# Patient Record
Sex: Female | Born: 1940 | Race: White | Hispanic: No | Smoking: Former smoker
Health system: Southern US, Community
[De-identification: ages and names within clinical notes are randomized; demographics above are authoritative.]

## PROBLEM LIST (undated history)

## (undated) DIAGNOSIS — J309 Allergic rhinitis, unspecified: Secondary | ICD-10-CM

## (undated) DIAGNOSIS — M810 Age-related osteoporosis without current pathological fracture: Secondary | ICD-10-CM

## (undated) DIAGNOSIS — Z9886 Personal history of breast implant removal: Secondary | ICD-10-CM

## (undated) DIAGNOSIS — Z8719 Personal history of other diseases of the digestive system: Secondary | ICD-10-CM

## (undated) DIAGNOSIS — F17200 Nicotine dependence, unspecified, uncomplicated: Secondary | ICD-10-CM

## (undated) DIAGNOSIS — J449 Chronic obstructive pulmonary disease, unspecified: Secondary | ICD-10-CM

## (undated) DIAGNOSIS — F411 Generalized anxiety disorder: Secondary | ICD-10-CM

## (undated) DIAGNOSIS — E042 Nontoxic multinodular goiter: Secondary | ICD-10-CM

## (undated) DIAGNOSIS — Z86010 Personal history of colonic polyps: Secondary | ICD-10-CM

## (undated) DIAGNOSIS — M1990 Unspecified osteoarthritis, unspecified site: Secondary | ICD-10-CM

## (undated) DIAGNOSIS — E785 Hyperlipidemia, unspecified: Secondary | ICD-10-CM

## (undated) DIAGNOSIS — G4700 Insomnia, unspecified: Secondary | ICD-10-CM

## (undated) DIAGNOSIS — R946 Abnormal results of thyroid function studies: Secondary | ICD-10-CM

## (undated) DIAGNOSIS — I10 Essential (primary) hypertension: Secondary | ICD-10-CM

## (undated) DIAGNOSIS — I2510 Atherosclerotic heart disease of native coronary artery without angina pectoris: Secondary | ICD-10-CM

## (undated) DIAGNOSIS — D649 Anemia, unspecified: Secondary | ICD-10-CM

## (undated) DIAGNOSIS — F1021 Alcohol dependence, in remission: Secondary | ICD-10-CM

## (undated) DIAGNOSIS — C50919 Malignant neoplasm of unspecified site of unspecified female breast: Secondary | ICD-10-CM

## (undated) HISTORY — DX: Abnormal results of thyroid function studies: R94.6

## (undated) HISTORY — PX: CATARACT EXTRACTION: SUR2

## (undated) HISTORY — DX: Malignant neoplasm of unspecified site of unspecified female breast: C50.919

## (undated) HISTORY — DX: Hyperlipidemia, unspecified: E78.5

## (undated) HISTORY — DX: Age-related osteoporosis without current pathological fracture: M81.0

## (undated) HISTORY — DX: Essential (primary) hypertension: I10

## (undated) HISTORY — DX: Atherosclerotic heart disease of native coronary artery without angina pectoris: I25.10

## (undated) HISTORY — DX: Nicotine dependence, unspecified, uncomplicated: F17.200

## (undated) HISTORY — DX: Insomnia, unspecified: G47.00

## (undated) HISTORY — DX: Personal history of colonic polyps: Z86.010

## (undated) HISTORY — DX: Nontoxic multinodular goiter: E04.2

## (undated) HISTORY — DX: Allergic rhinitis, unspecified: J30.9

## (undated) HISTORY — DX: Personal history of other diseases of the digestive system: Z87.19

## (undated) HISTORY — DX: Anemia, unspecified: D64.9

## (undated) HISTORY — DX: Generalized anxiety disorder: F41.1

## (undated) HISTORY — PX: BREAST SURGERY: SHX581

## (undated) HISTORY — DX: Personal history of breast implant removal: Z98.86

## (undated) HISTORY — DX: Unspecified osteoarthritis, unspecified site: M19.90

## (undated) HISTORY — DX: Alcohol dependence, in remission: F10.21

---

## 1999-02-06 ENCOUNTER — Encounter: Payer: Self-pay | Admitting: Gastroenterology

## 1999-02-06 ENCOUNTER — Inpatient Hospital Stay (HOSPITAL_COMMUNITY): Admission: EM | Admit: 1999-02-06 | Discharge: 1999-02-08 | Payer: Self-pay | Admitting: Gastroenterology

## 1999-02-07 ENCOUNTER — Encounter: Payer: Self-pay | Admitting: Gastroenterology

## 2004-05-26 LAB — HM COLONOSCOPY

## 2004-09-23 ENCOUNTER — Encounter: Payer: Self-pay | Admitting: Internal Medicine

## 2004-09-23 LAB — CONVERTED CEMR LAB

## 2004-12-16 ENCOUNTER — Ambulatory Visit: Payer: Self-pay | Admitting: Internal Medicine

## 2004-12-19 ENCOUNTER — Ambulatory Visit: Payer: Self-pay | Admitting: Internal Medicine

## 2005-01-20 ENCOUNTER — Ambulatory Visit: Payer: Self-pay | Admitting: Internal Medicine

## 2006-01-21 ENCOUNTER — Ambulatory Visit: Payer: Self-pay | Admitting: Internal Medicine

## 2007-02-02 ENCOUNTER — Ambulatory Visit: Payer: Self-pay | Admitting: Family Medicine

## 2007-02-14 ENCOUNTER — Encounter: Payer: Self-pay | Admitting: Internal Medicine

## 2007-02-14 DIAGNOSIS — E785 Hyperlipidemia, unspecified: Secondary | ICD-10-CM

## 2007-02-14 DIAGNOSIS — J309 Allergic rhinitis, unspecified: Secondary | ICD-10-CM

## 2007-02-14 DIAGNOSIS — Z860100 Personal history of colon polyps, unspecified: Secondary | ICD-10-CM

## 2007-02-14 DIAGNOSIS — Z8719 Personal history of other diseases of the digestive system: Secondary | ICD-10-CM

## 2007-02-14 DIAGNOSIS — M1990 Unspecified osteoarthritis, unspecified site: Secondary | ICD-10-CM | POA: Insufficient documentation

## 2007-02-14 DIAGNOSIS — F1021 Alcohol dependence, in remission: Secondary | ICD-10-CM

## 2007-02-14 DIAGNOSIS — I10 Essential (primary) hypertension: Secondary | ICD-10-CM | POA: Insufficient documentation

## 2007-02-14 DIAGNOSIS — E042 Nontoxic multinodular goiter: Secondary | ICD-10-CM

## 2007-02-14 DIAGNOSIS — M810 Age-related osteoporosis without current pathological fracture: Secondary | ICD-10-CM

## 2007-02-14 HISTORY — DX: Hyperlipidemia, unspecified: E78.5

## 2007-02-14 HISTORY — DX: Alcohol dependence, in remission: F10.21

## 2007-02-14 HISTORY — DX: Personal history of other diseases of the digestive system: Z87.19

## 2007-02-14 HISTORY — DX: Age-related osteoporosis without current pathological fracture: M81.0

## 2007-02-14 HISTORY — DX: Personal history of colonic polyps: Z86.010

## 2007-02-14 HISTORY — DX: Allergic rhinitis, unspecified: J30.9

## 2007-02-14 HISTORY — DX: Personal history of colon polyps, unspecified: Z86.0100

## 2007-02-14 HISTORY — DX: Unspecified osteoarthritis, unspecified site: M19.90

## 2007-02-14 HISTORY — DX: Essential (primary) hypertension: I10

## 2007-02-14 HISTORY — DX: Nontoxic multinodular goiter: E04.2

## 2007-03-15 ENCOUNTER — Ambulatory Visit: Payer: Self-pay | Admitting: Internal Medicine

## 2007-03-15 LAB — CONVERTED CEMR LAB
ALT: 13 units/L (ref 0–35)
AST: 22 units/L (ref 0–37)
Albumin: 3.7 g/dL (ref 3.5–5.2)
Alkaline Phosphatase: 54 units/L (ref 39–117)
BUN: 11 mg/dL (ref 6–23)
Basophils Absolute: 0 10*3/uL (ref 0.0–0.1)
Basophils Relative: 0 % (ref 0.0–1.0)
Bilirubin Urine: NEGATIVE
Bilirubin, Direct: 0.1 mg/dL (ref 0.0–0.3)
CO2: 23 meq/L (ref 19–32)
Calcium: 9.2 mg/dL (ref 8.4–10.5)
Chloride: 113 meq/L — ABNORMAL HIGH (ref 96–112)
Cholesterol: 170 mg/dL (ref 0–200)
Creatinine, Ser: 0.7 mg/dL (ref 0.4–1.2)
Crystals: NEGATIVE
Eosinophils Absolute: 0.3 10*3/uL (ref 0.0–0.6)
Eosinophils Relative: 2.8 % (ref 0.0–5.0)
GFR calc Af Amer: 108 mL/min
GFR calc non Af Amer: 89 mL/min
Glucose, Bld: 103 mg/dL — ABNORMAL HIGH (ref 70–99)
HCT: 35.5 % — ABNORMAL LOW (ref 36.0–46.0)
HDL: 62 mg/dL (ref >39.0)
Hemoglobin, Urine: NEGATIVE
Hemoglobin: 12.2 g/dL (ref 12.0–15.0)
Ketones, ur: NEGATIVE mg/dL
LDL Cholesterol: 76 mg/dL (ref 0–99)
Leukocytes, UA: NEGATIVE
Lymphocytes Relative: 15.9 % (ref 12.0–46.0)
MCHC: 34.5 g/dL (ref 30.0–36.0)
MCV: 82.5 fL (ref 78.0–100.0)
Monocytes Absolute: 0.2 10*3/uL (ref 0.2–0.7)
Monocytes Relative: 2.3 % — ABNORMAL LOW (ref 3.0–11.0)
Mucus, UA: NEGATIVE
Neutro Abs: 7.8 10*3/uL — ABNORMAL HIGH (ref 1.4–7.7)
Neutrophils Relative %: 79 % — ABNORMAL HIGH (ref 43.0–77.0)
Nitrite: POSITIVE — AB
Platelets: 273 10*3/uL (ref 150–400)
Potassium: 3.9 meq/L (ref 3.5–5.1)
RBC: 4.3 M/uL (ref 3.87–5.11)
RDW: 14.2 % (ref 11.5–14.6)
Sodium: 145 meq/L (ref 135–145)
Specific Gravity, Urine: >=1.03 (ref 1.000–1.03)
TSH: 0.78 microintl units/mL (ref 0.35–5.50)
Total Bilirubin: 0.5 mg/dL (ref 0.3–1.2)
Total CHOL/HDL Ratio: 2.7
Total Protein: 7.1 g/dL (ref 6.0–8.3)
Triglycerides: 159 mg/dL — ABNORMAL HIGH (ref 0–149)
Urine Glucose: NEGATIVE mg/dL
Urobilinogen, UA: 0.2 (ref 0.0–1.0)
VLDL: 32 mg/dL (ref 0–40)
WBC: 9.9 10*3/uL (ref 4.5–10.5)
pH: 6 (ref 5.0–8.0)

## 2007-03-19 ENCOUNTER — Ambulatory Visit: Payer: Self-pay | Admitting: Internal Medicine

## 2007-03-19 ENCOUNTER — Encounter: Payer: Self-pay | Admitting: Internal Medicine

## 2007-03-19 DIAGNOSIS — R5383 Other fatigue: Secondary | ICD-10-CM

## 2007-03-19 DIAGNOSIS — F411 Generalized anxiety disorder: Secondary | ICD-10-CM | POA: Insufficient documentation

## 2007-03-19 DIAGNOSIS — R5381 Other malaise: Secondary | ICD-10-CM | POA: Insufficient documentation

## 2007-03-19 HISTORY — DX: Generalized anxiety disorder: F41.1

## 2007-05-27 HISTORY — PX: OTHER SURGICAL HISTORY: SHX169

## 2007-09-15 ENCOUNTER — Encounter: Payer: Self-pay | Admitting: Internal Medicine

## 2008-04-25 ENCOUNTER — Ambulatory Visit: Payer: Self-pay | Admitting: Internal Medicine

## 2008-05-26 HISTORY — PX: BREAST SURGERY: SHX581

## 2008-05-26 HISTORY — PX: OTHER SURGICAL HISTORY: SHX169

## 2008-10-18 ENCOUNTER — Encounter: Payer: Self-pay | Admitting: Internal Medicine

## 2008-10-25 ENCOUNTER — Telehealth (INDEPENDENT_AMBULATORY_CARE_PROVIDER_SITE_OTHER): Payer: Self-pay | Admitting: *Deleted

## 2008-12-19 ENCOUNTER — Telehealth (INDEPENDENT_AMBULATORY_CARE_PROVIDER_SITE_OTHER): Payer: Self-pay | Admitting: *Deleted

## 2008-12-19 ENCOUNTER — Encounter: Payer: Self-pay | Admitting: Internal Medicine

## 2008-12-20 ENCOUNTER — Ambulatory Visit: Payer: Self-pay | Admitting: Internal Medicine

## 2008-12-20 DIAGNOSIS — R079 Chest pain, unspecified: Secondary | ICD-10-CM | POA: Insufficient documentation

## 2008-12-20 DIAGNOSIS — N63 Unspecified lump in unspecified breast: Secondary | ICD-10-CM | POA: Insufficient documentation

## 2008-12-21 ENCOUNTER — Encounter: Payer: Self-pay | Admitting: Internal Medicine

## 2008-12-23 ENCOUNTER — Telehealth: Payer: Self-pay | Admitting: Internal Medicine

## 2008-12-23 DIAGNOSIS — Z9886 Personal history of breast implant removal: Secondary | ICD-10-CM | POA: Insufficient documentation

## 2008-12-23 HISTORY — DX: Personal history of breast implant removal: Z98.86

## 2008-12-26 ENCOUNTER — Encounter: Admission: RE | Admit: 2008-12-26 | Discharge: 2008-12-26 | Payer: Self-pay | Admitting: Radiology

## 2008-12-28 ENCOUNTER — Telehealth: Payer: Self-pay | Admitting: Internal Medicine

## 2009-05-01 ENCOUNTER — Telehealth: Payer: Self-pay | Admitting: Internal Medicine

## 2009-06-20 ENCOUNTER — Telehealth: Payer: Self-pay | Admitting: Internal Medicine

## 2009-11-12 ENCOUNTER — Ambulatory Visit: Payer: Self-pay | Admitting: Internal Medicine

## 2009-12-10 ENCOUNTER — Ambulatory Visit: Payer: Self-pay | Admitting: Internal Medicine

## 2009-12-10 DIAGNOSIS — R946 Abnormal results of thyroid function studies: Secondary | ICD-10-CM | POA: Insufficient documentation

## 2009-12-10 HISTORY — DX: Abnormal results of thyroid function studies: R94.6

## 2010-01-15 ENCOUNTER — Encounter: Payer: Self-pay | Admitting: Internal Medicine

## 2010-05-28 ENCOUNTER — Telehealth: Payer: Self-pay | Admitting: Internal Medicine

## 2010-06-23 LAB — CONVERTED CEMR LAB
ALT: 20 units/L (ref 0–35)
AST: 25 units/L (ref 0–37)
Albumin: 3.9 g/dL (ref 3.5–5.2)
Alkaline Phosphatase: 59 units/L (ref 39–117)
BUN: 13 mg/dL (ref 6–23)
BUN: 15 mg/dL (ref 6–23)
Basophils Relative: 0.8 % (ref 0.0–3.0)
Bilirubin Urine: NEGATIVE
Bilirubin, Direct: 0.2 mg/dL (ref 0.0–0.3)
CO2: 24 meq/L (ref 19–32)
CO2: 25 meq/L (ref 19–32)
Chloride: 107 meq/L (ref 96–112)
Chloride: 113 meq/L — ABNORMAL HIGH (ref 96–112)
Creatinine, Ser: 0.6 mg/dL (ref 0.4–1.2)
Crystals: NEGATIVE
Eosinophils Absolute: 0.1 10*3/uL (ref 0.0–0.7)
Eosinophils Relative: 1.7 % (ref 0.0–5.0)
Glucose, Bld: 92 mg/dL (ref 70–99)
HCT: 35.4 % — ABNORMAL LOW (ref 36.0–46.0)
HCT: 37.1 % (ref 36.0–46.0)
HDL: 53.8 mg/dL (ref >39.00)
Hemoglobin, Urine: NEGATIVE
Hemoglobin, Urine: NEGATIVE
Hemoglobin: 12.5 g/dL (ref 12.0–15.0)
Iron: 140 ug/dL (ref 42–145)
Ketones, ur: NEGATIVE mg/dL
LDL Cholesterol: 103 mg/dL — ABNORMAL HIGH (ref 0–99)
Lymphs Abs: 1.2 10*3/uL (ref 0.7–4.0)
MCHC: 33.1 g/dL (ref 30.0–36.0)
MCV: 88.8 fL (ref 78.0–100.0)
Monocytes Absolute: 0.2 10*3/uL (ref 0.1–1.0)
Monocytes Absolute: 0.4 10*3/uL (ref 0.1–1.0)
Monocytes Relative: 3 % (ref 3.0–12.0)
Mucus, UA: NEGATIVE
Neutrophils Relative %: 74.5 % (ref 43.0–77.0)
Nitrite: NEGATIVE
Nitrite: POSITIVE
Pap Smear: NORMAL
Platelets: 279 10*3/uL (ref 150–400)
Platelets: 312 10*3/uL (ref 150.0–400.0)
Potassium: 3.5 meq/L (ref 3.5–5.1)
Sed Rate: 9 mm/hr (ref 0–22)
Specific Gravity, Urine: 1.01 (ref 1.000–1.03)
TSH: 0.27 microintl units/mL — ABNORMAL LOW (ref 0.35–5.50)
Total Bilirubin: 0.5 mg/dL (ref 0.3–1.2)
Total CHOL/HDL Ratio: 3
Total Protein, Urine: NEGATIVE mg/dL
Total Protein: 6.9 g/dL (ref 6.0–8.3)
Total Protein: 7.4 g/dL (ref 6.0–8.3)
Urobilinogen, UA: 0.2 (ref 0.0–1.0)
Vitamin B-12: 451 pg/mL (ref 211–911)
WBC: 8 10*3/uL (ref 4.5–10.5)
pH: 6 (ref 5.0–8.0)
pH: 6 (ref 5.0–8.0)

## 2010-06-25 NOTE — Assessment & Plan Note (Signed)
Summary: 1 mos f/u // # cd   Vital Signs:  Patient profile:   70 year old female Height:      60 inches Weight:      94 pounds BMI:     18.42 O2 Sat:      98 % on Room air Temp:     98.3 degrees F oral Pulse rate:   128 / minute BP sitting:   130 / 82  (left arm) Cuff size:   regular  Vitals Entered By: Zella Ball Ewing CMA Duncan Dull) (December 10, 2009 9:52 AM)  O2 Flow:  Room air CC: 1 month Followup/RE   CC:  1 month Followup/RE.  History of Present Illness: much less nervous today it seems;  BP at home < 140/90;  Pt denies CP, sob, doe, wheezing, orthopnea, pnd, worsening LE edema, palps, dizziness or syncope  Pt denies new neuro symptoms such as headache, facial or extremity weakness  No fever, wt loss, night sweats, loss of appetite or other constitutional symptoms  Does have som emild ST today   - no fever, seemed to start over the weekend after the grandkids were over.  No cough.  Does have some mild allergy smptoms as wel as she stopped her zyrtec recently.  Denies specific hyper or hypothyroid symtpoms such as wt, voice or skin changes  Problems Prior to Update: 1)  Preventive Health Care  (ICD-V70.0) 2)  Breast Implant Removal Status  (ICD-V45.83) 3)  Chest Pain  (ICD-786.50) 4)  Breast Mass, Right  (ICD-611.72) 5)  Fatigue  (ICD-780.79) 6)  Fatigue  (ICD-780.79) 7)  Anxiety  (ICD-300.00) 8)  Goiter, Multinodular  (ICD-241.1) 9)  Pancreatitis, Hx of  (ICD-V12.70) 10)  Hx, Personal, Alcoholism  (ICD-V11.3) 11)  Alcoholic Hepatitis, Hx of  (ICD-V12.79) 12)  Osteoporosis  (ICD-733.00) 13)  Osteoarthritis  (ICD-715.90) 14)  Hypertension  (ICD-401.9) 15)  Hyperlipidemia  (ICD-272.4) 16)  Colonic Polyps, Hx of  (ICD-V12.72) 17)  Allergic Rhinitis  (ICD-477.9)  Medications Prior to Update: 1)  Lovastatin 40 Mg Tabs (Lovastatin) .... Take 1 Tablet By Mouth Once A Day 2)  Alprazolam 0.25 Mg  Tabs (Alprazolam) .Marland Kitchen.. 1 By Mouth Three Times A Day As Needed Nerves 3)  Losartan  Potassium 50 Mg Tabs (Losartan Potassium) .Marland Kitchen.. 1po Once Daily  Current Medications (verified): 1)  Lovastatin 40 Mg Tabs (Lovastatin) .... Take 1 Tablet By Mouth Once A Day 2)  Alprazolam 0.25 Mg  Tabs (Alprazolam) .Marland Kitchen.. 1 By Mouth Three Times A Day As Needed Nerves 3)  Losartan Potassium 50 Mg Tabs (Losartan Potassium) .Marland Kitchen.. 1po Once Daily 4)  Vitamin D 1000 Unit Tabs (Cholecalciferol) .Marland Kitchen.. 1 By Mouth Once Daily  Allergies (verified): 1)  ! 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   Head:  normocephalic and atraumatic.   Eyes:  vision grossly intact, pupils equal, and pupils round.   Ears:  R ear normal and L ear normal.   Nose:  no external deformity and no nasal discharge.   Mouth:  pharyngeal erythema - mild,  and fair dentition.   Neck:  supple and no masses.   Lungs:  normal respiratory effort and normal breath sounds.   Heart:  normal rate and regular rhythm.   Extremities:  no edema, no erythema    Impression & Recommendations:  Problem # 1:  HYPERTENSION (ICD-401.9)  Her updated medication list for this problem includes:    Losartan Potassium 50 Mg Tabs (Losartan potassium) .Marland Kitchen... 1po once daily improved and stable overall by hx and exam, ok to continue meds/tx as is   Problem  # 2:  HYPERLIPIDEMIA (ICD-272.4)  Her updated medication list for this problem includes:    Lovastatin 40 Mg Tabs (Lovastatin) .Marland Kitchen... Take 1 tablet by mouth once a day  Labs Reviewed: SGOT: 29 (11/12/2009)   SGPT: 17 (11/12/2009)   HDL:53.80 (11/12/2009), 48.1 (04/25/2008)  LDL:103 (11/12/2009), 79 (16/02/9603)  Chol:173 (11/12/2009), 150 (04/25/2008)  Trig:82.0 (11/12/2009), 114 (04/25/2008) stable overall by hx and exam, ok to continue meds/tx as is   Problem # 3:  ABNORMAL THYROID FUNCTION TESTS (ICD-794.5)  to repeat the tsh today  - ? overactive thyroid  Orders: TLB-TSH (Thyroid Stimulating Hormone) (84443-TSH)  Problem # 4:  PHARYNGITIS-ACUTE (ICD-462) prob viral - ok to follow, salt water gargles , tyelnol as needed   Complete Medication List: 1)  Lovastatin 40 Mg Tabs (Lovastatin) .... Take 1 tablet by mouth once a day 2)  Alprazolam 0.25 Mg Tabs (Alprazolam) .Marland Kitchen.. 1 by mouth three times a day as needed nerves 3)  Losartan Potassium 50 Mg Tabs (Losartan potassium) .Marland Kitchen.. 1po once daily 4)  Vitamin D 1000 Unit Tabs (Cholecalciferol) .Marland Kitchen.. 1 by mouth once daily  Patient Instructions: 1)  Continue all previous medications as before this visit  2)  Please go to the Lab in the basement for your blood and/or urine tests today 3)  Please schedule a follow-up appointment in 1 year or sooner if needed

## 2010-06-25 NOTE — Assessment & Plan Note (Signed)
Summary: FU AND CHANGES BEHIND THE R EYE BLOOD VESSELS-MED REFILL X 2W...   Vital Signs:  Patient profile:   70 year old female Height:      60.5 inches Weight:      94.38 pounds BMI:     18.19 O2 Sat:      95 % on Room air Temp:     98 degrees F oral Pulse rate:   101 / minute BP sitting:   150 / 82  (left arm) Cuff size:   regular  Vitals Entered ByZella Ball Ewing (November 12, 2009 8:51 AM)  O2 Flow:  Room air  Preventive Care Screening  Mammogram:    Date:  11/23/2008    Next Due:  11/2009    Results:  normal      declines dxa at this time  CC: Followup, refills/RE   CC:  Followup and refills/RE.  History of Present Illness: here after recent optho retinal exam with changes c/w prob HTN;  also BP elev with periodontal exam;  no symtpoms  - Pt denies CP, sob, doe, wheezing, orthopnea, pnd, worsening LE edema, palps, dizziness or syncope  Pt denies new neuro symptoms such as headache, facial or extremity weakness   Has been thought to have had "white coat " in the past and no prior tx.  Xanax works well for occasional palp and anxiety.  Overall good compliance, tolerating meds well.    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 Depression/mood screen: Negative Hearing: Intact bilateral Visual Acuity: Grossly normal, gets exam yearly, s/p bilat cataracts, wears reading glasses only ADL's: Capable  Fall Risk: None Home Safety: Good Cognitive Impairment:  Gen appearance, affect, speech, memory, attention & motor skills grossly intact End-of-Life Planning: Advance directive - Full code/I agree , but has living will, does not want long term mechanical ventilation  Problems Prior to Update: 1)  Preventive Health Care  (ICD-V70.0) 2)  Breast Implant Removal Status  (ICD-V45.83) 3)  Chest Pain  (ICD-786.50) 4)  Breast Mass, Right  (ICD-611.72) 5)  Fatigue   (ICD-780.79) 6)  Fatigue  (ICD-780.79) 7)  Anxiety  (ICD-300.00) 8)  Goiter, Multinodular  (ICD-241.1) 9)  Pancreatitis, Hx of  (ICD-V12.70) 10)  Hx, Personal, Alcoholism  (ICD-V11.3) 11)  Alcoholic Hepatitis, Hx of  (ICD-V12.79) 12)  Osteoporosis  (ICD-733.00) 13)  Osteoarthritis  (ICD-715.90) 14)  Hypertension  (ICD-401.9) 15)  Hyperlipidemia  (ICD-272.4) 16)  Colonic Polyps, Hx of  (ICD-V12.72) 17)  Allergic Rhinitis  (ICD-477.9)  Medications Prior to Update: 1)  Lovastatin 40 Mg Tabs (Lovastatin) .... Take 1 Tablet By Mouth Once A Day 2)  Alprazolam 0.25 Mg  Tabs (Alprazolam) .Marland Kitchen.. 1 By Mouth Three Times A Day As Needed Nerves  Current Medications (verified): 1)  Lovastatin 40 Mg Tabs (Lovastatin) .... Take 1 Tablet By Mouth Once A Day 2)  Alprazolam 0.25 Mg  Tabs (Alprazolam) .Marland Kitchen.. 1 By Mouth Three Times A Day As Needed Nerves 3)  Losartan Potassium 50 Mg Tabs (Losartan Potassium) .Marland Kitchen.. 1po Once Daily  Allergies (verified): 1)  ! 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         all otherwise negative per pt -  - except for ongoing fatigue without OSA symtpoms , worsening depression, or wt loss, night sweats  Physical Exam  General:  alert and underweight appearing.   Head:  normocephalic and atraumatic.   Eyes:  vision grossly intact, pupils equal, and pupils round.   Ears:  R ear normal and L ear normal.   Nose:  no external deformity and no nasal discharge.   Mouth:  no gingival abnormalities and pharynx pink and moist.   Neck:  supple and no masses.   Lungs:  normal respiratory effort and normal breath sounds.   Heart:  normal rate and regular rhythm.   Abdomen:  soft, non-tender, and normal bowel sounds.   Msk:  no joint tenderness and no joint swelling.   Extremities:  no edema, no erythema  Neurologic:  strength normal in all extremities and gait normal.   Skin:  color normal and no rashes.   Psych:  not depressed appearing and moderately anxious.     Impression & Recommendations:  Problem # 1:  Preventive Health Care (ICD-V70.0)  Overall doing well, age appropriate education and counseling updated and referral for appropriate preventive services done unless declined, immunizations up to date or declined, diet counseling done if overweight, urged to quit smoking if smokes , most recent labs reviewed and current ordered if appropriate, ecg reviewed or declined (interpretation per ECG scanned in the EMR if done); information regarding Medicare Prevention requirements given if appropriate; speciality referrals updated as appropriate   Orders: First annual wellness visit with prevention plan  (T7322)  Problem # 2:  HYPERTENSION (ICD-401.9)  Orders: EKG w/ Interpretation (93000) Prescription Created Electronically [phone])  Her updated medication list for this problem includes:    Losartan  Potassium 50 Mg Tabs (Losartan potassium) .Marland Kitchen... 1po once daily treat as above, f/u any worsening signs or symptoms   Problem # 3:  HYPERLIPIDEMIA (ICD-272.4)  Her updated medication list for this problem includes:    Lovastatin 40 Mg Tabs (Lovastatin) .Marland Kitchen... Take 1 tablet by mouth once a day  Orders: TLB-Lipid Panel (80061-LIPID) stable overall by hx and exam, ok to continue meds/tx as is , Pt to continue diet efforts, good med tolerance; to check labs - goal LDL less than 70   Problem # 4:  ANXIETY (ICD-300.00)  Her updated medication list for this problem includes:    Alprazolam 0.25 Mg Tabs (Alprazolam) .Marland Kitchen... 1 by mouth three times a day as needed nerves treat as above, f/u any worsening signs or symptoms   Problem # 5:  COLONIC POLYPS, HX OF (ICD-V12.72)  due for colonoscopy - ok for referral  Orders: Gastroenterology Referral (GI)  Problem # 6:  FATIGUE (ICD-780.79)  exam benign, to check labs below; follow with expectant management   Orders: T-Vitamin D (25-Hydroxy) (70623-76283) TLB-BMP (Basic Metabolic Panel-BMET) (80048-METABOL) TLB-CBC Platelet - w/Differential (85025-CBCD) TLB-Hepatic/Liver Function Pnl (80076-HEPATIC)  TLB-TSH (Thyroid Stimulating Hormone) (84443-TSH) TLB-Sedimentation Rate (ESR) (85652-ESR) TLB-IBC Pnl (Iron/FE;Transferrin) (83550-IBC) TLB-B12 + Folate Pnl (21308_65784-O96/EXB) TLB-Udip ONLY (81003-UDIP)  Complete Medication List: 1)  Lovastatin 40 Mg Tabs (Lovastatin) .... Take 1 tablet by mouth once a day 2)  Alprazolam 0.25 Mg Tabs (Alprazolam) .Marland Kitchen.. 1 by mouth three times a day as needed nerves 3)  Losartan Potassium 50 Mg Tabs (Losartan potassium) .Marland Kitchen.. 1po once daily  Other Orders: TD Toxoids IM 7 YR + (28413) Admin 1st Vaccine (24401)  Patient Instructions: 1)  you had the tetanus shot today 2)  Your EKG was good today 3)  You will be contacted about the referral(s) to: colonoscopy 4)  Please go to the Lab in the basement for your  blood and/or urine tests today 5)  Please take all new medications as prescribed - the blood pressure medicine 6)  Please schedule a follow-up appointment in 1 month. 7)  Check your Blood Pressure regularly. Your goal is to be less than 140/90 8)  Take an Aspirin every day - 81 mg - 1 per day - COATED only Prescriptions: LOSARTAN POTASSIUM 50 MG TABS (LOSARTAN POTASSIUM) 1po once daily  #90 x 3   Entered and Authorized by:   Corwin Levins MD   Signed by:   Corwin Levins MD on 11/12/2009   Method used:   Print then Give to Patient   RxID:   [id number] ALPRAZOLAM 0.25 MG  TABS (ALPRAZOLAM) 1 by mouth three times a day as needed nerves  #90 x 5   Entered and Authorized by:   Corwin Levins MD   Signed by:   Corwin Levins MD on 11/12/2009   Method used:   Print then Give to Patient   RxID:   [id number] LOVASTATIN 40 MG TABS (LOVASTATIN) Take 1 tablet by mouth once a day  #90 x 3   Entered and Authorized by:   Corwin Levins MD   Signed by:   Corwin Levins MD on 11/12/2009   Method used:   Print then Give to Patient   RxID:   [id number]    Immunizations Administered:  Tetanus Vaccine:    Vaccine Type: Td    Site: right deltoid    Mfr: Sanofi Pasteur    Dose: 0.5 ml    Route: IM    Given by: Zella Ball Ewing    Exp. Date: 06/08/2011    Lot #: [id number]    VIS given: 04/13/07 version given November 12, 2009.

## 2010-06-25 NOTE — Letter (Signed)
Summary: Referral - not able to see patient  [facility]  [address] [place], [place] [address]   Phone: [phone]  Fax: [phone]    January 15, 2010    Corwin Levins, M.D. [address] [place], [place] [address]   Re:   Tammy Wolfe DOB:  [birth year] MRN:   [id number]    Dear Dr. Jonny Ruiz:  Thank you for your kind referral of the above patient.  We have attempted to schedule the recommended procedure Screening Colonoscopy but have not been able to schedule because:   X  The patient was not available by phone and/or has not returned our calls.  ___ The patient declined to schedule the procedure at this time.  We appreciate the referral and hope that we will have the opportunity to treat this patient in the future.    Sincerely,    [facility] [phone]

## 2010-06-25 NOTE — Progress Notes (Signed)
----   Converted from flag ---- ---- 06/20/2009 8:24 AM, Zella Ball Ewing wrote: called pt left msg to call back  ---- 06/19/2009 5:34 PM, Corwin Levins MD wrote: please call pt ; she is due for f/u MRI and mammogram, but I did not know if I needed to order , or has it already been ordered?  ---- 12/22/2008 5:31 PM, Corwin Levins MD wrote: due for f/u breast MR and mammogram - I think I will need to order ------------------------------  I called and spoke to the pt. She does want you to schedule her mammogram. But for now she cannot afford a MRI. She did state that if the mammogram showed something then of course she would do a MRI, but for now just schedule the mammogram.  ok for diag mammogram - will order Corwin Levins MD  June 20, 2009 1:44 PM

## 2010-06-25 NOTE — Progress Notes (Signed)
----   Converted from flag ---- ---- 06/19/2009 5:34 PM, Corwin Levins MD wrote: please call pt ; she is due for f/u MRI and mammogram, but I did not know if I needed to order , or has it already been ordered?  ---- 12/22/2008 5:31 PM, Corwin Levins MD wrote: due for f/u breast MR and mammogram - I think I will need to order ------------------------------ called pt left msg to call back

## 2010-06-27 NOTE — Progress Notes (Signed)
Summary: medication refill  Phone Note Refill Request Message from:  Fax from Pharmacy on May 28, 2010 11:14 AM  Refills Requested: Medication #1:  ALPRAZOLAM 0.25 MG  TABS 1 by mouth three times a day as needed nerves   Dosage confirmed as above?Dosage Confirmed   Last Refilled: 11/12/2009   Notes: [facility], [phone] Initial call taken by: Zella Ball Ewing CMA Duncan Dull),  May 28, 2010 11:15 AM  Follow-up for Phone Call        Rx faxed to [facility] pharmacy Follow-up by: Brenton Grills CMA (AAMA),  May 28, 2010 1:30 PM    New/Updated Medications: ALPRAZOLAM 0.25 MG  TABS (ALPRAZOLAM) 1 by mouth three times a day as needed nerves Prescriptions: ALPRAZOLAM 0.25 MG  TABS (ALPRAZOLAM) 1 by mouth three times a day as needed nerves  #90 x 5   Entered and Authorized by:   Corwin Levins MD   Signed by:   Corwin Levins MD on 05/28/2010   Method used:   Print then Give to Patient   RxID:   [id number]  done hardcopy to [facility] side B - dahlia Corwin Levins MD  May 28, 2010 1:08 PM

## 2010-11-13 ENCOUNTER — Other Ambulatory Visit (INDEPENDENT_AMBULATORY_CARE_PROVIDER_SITE_OTHER): Payer: Medicare Other

## 2010-11-13 ENCOUNTER — Encounter: Payer: Self-pay | Admitting: Internal Medicine

## 2010-11-13 ENCOUNTER — Ambulatory Visit (INDEPENDENT_AMBULATORY_CARE_PROVIDER_SITE_OTHER): Payer: Medicare Other | Admitting: Internal Medicine

## 2010-11-13 DIAGNOSIS — R5381 Other malaise: Secondary | ICD-10-CM

## 2010-11-13 DIAGNOSIS — I10 Essential (primary) hypertension: Secondary | ICD-10-CM

## 2010-11-13 DIAGNOSIS — Z0001 Encounter for general adult medical examination with abnormal findings: Secondary | ICD-10-CM | POA: Insufficient documentation

## 2010-11-13 DIAGNOSIS — R5383 Other fatigue: Secondary | ICD-10-CM

## 2010-11-13 DIAGNOSIS — E785 Hyperlipidemia, unspecified: Secondary | ICD-10-CM

## 2010-11-13 DIAGNOSIS — Z0000 Encounter for general adult medical examination without abnormal findings: Secondary | ICD-10-CM

## 2010-11-13 DIAGNOSIS — E559 Vitamin D deficiency, unspecified: Secondary | ICD-10-CM | POA: Insufficient documentation

## 2010-11-13 LAB — URINALYSIS, ROUTINE W REFLEX MICROSCOPIC
Bilirubin Urine: NEGATIVE
Nitrite: NEGATIVE
Specific Gravity, Urine: <=1.005 (ref 1.000–1.030)
Total Protein, Urine: NEGATIVE
pH: 6.5 (ref 5.0–8.0)

## 2010-11-13 LAB — BASIC METABOLIC PANEL
CO2: 24 mEq/L (ref 19–32)
Calcium: 9.1 mg/dL (ref 8.4–10.5)
Chloride: 107 mEq/L (ref 96–112)
Creatinine, Ser: 0.7 mg/dL (ref 0.4–1.2)
Glucose, Bld: 106 mg/dL — ABNORMAL HIGH (ref 70–99)

## 2010-11-13 LAB — CBC WITH DIFFERENTIAL/PLATELET
Basophils Absolute: 0 10*3/uL (ref 0.0–0.1)
Eosinophils Absolute: 0.1 10*3/uL (ref 0.0–0.7)
Hemoglobin: 11.4 g/dL — ABNORMAL LOW (ref 12.0–15.0)
Lymphocytes Relative: 20.2 % (ref 12.0–46.0)
MCHC: 33.8 g/dL (ref 30.0–36.0)
Monocytes Relative: 6.6 % (ref 3.0–12.0)
Neutrophils Relative %: 71.7 % (ref 43.0–77.0)
Platelets: 284 10*3/uL (ref 150.0–400.0)
RDW: 15.3 % — ABNORMAL HIGH (ref 11.5–14.6)

## 2010-11-13 LAB — LIPID PANEL
LDL Cholesterol: 115 mg/dL — ABNORMAL HIGH (ref 0–99)
Total CHOL/HDL Ratio: 3
Triglycerides: 53 mg/dL (ref 0.0–149.0)

## 2010-11-13 LAB — HEPATIC FUNCTION PANEL
AST: 35 U/L (ref 0–37)
Albumin: 4.2 g/dL (ref 3.5–5.2)
Alkaline Phosphatase: 86 U/L (ref 39–117)
Bilirubin, Direct: 0.1 mg/dL (ref 0.0–0.3)
Total Bilirubin: 0.6 mg/dL (ref 0.3–1.2)

## 2010-11-13 LAB — TSH: TSH: 0.45 u[IU]/mL (ref 0.35–5.50)

## 2010-11-13 MED ORDER — LOVASTATIN 40 MG PO TABS: 40.0000 mg | ORAL_TABLET | Freq: Every day | ORAL | Status: DC

## 2010-11-13 MED ORDER — ALPRAZOLAM 0.25 MG PO TABS: 0.2500 mg | ORAL_TABLET | Freq: Three times a day (TID) | ORAL | Status: DC | PRN

## 2010-11-13 MED ORDER — LOSARTAN POTASSIUM 50 MG PO TABS: 50.0000 mg | ORAL_TABLET | Freq: Every day | ORAL | Status: DC

## 2010-11-13 NOTE — Assessment & Plan Note (Signed)
Etiology unclear, Exam otherwise benign, to check labs as documented, follow with expectant management  

## 2010-11-13 NOTE — Progress Notes (Signed)
Subjective:    Patient ID: Tammy Wolfe, female    DOB: 18-Jan-1941, 70 y.o.   MRN: [id number]  HPI   Here for wellness and f/u;  Overall doing ok;  Pt denies CP, worsening SOB, DOE, wheezing, orthopnea, PND, worsening LE edema, palpitations, dizziness or syncope.  Pt denies neurological change such as new Headache, facial or extremity weakness.  Pt denies polydipsia, polyuria, or low sugar symptoms. Pt states overall good compliance with treatment and medications, good tolerability, and trying to follow lower cholesterol diet.  Pt denies worsening depressive symptoms, suicidal ideation or panic though has ongoing anxiety, controlled per pt. No fever, wt loss, night sweats, loss of appetite, or other constitutional symptoms.  Pt states good ability with ADL's, low fall risk, home safety reviewed and adequate, no significant changes in hearing or vision, and occasionally active with exercise  Does have sense of ongoing fatigue, but denies signficant hypersomnolence.  Past Medical History  Diagnosis Date  . ABNORMAL THYROID FUNCTION TESTS 12/10/2009  . ALCOHOLIC HEPATITIS, HX OF 02/14/2007  . ALLERGIC RHINITIS 02/14/2007  . ANXIETY 03/19/2007  . Breast implant removal status 12/23/2008  . BREAST MASS, RIGHT 12/20/2008  . CHEST PAIN 12/20/2008  . COLONIC POLYPS, HX OF 02/14/2007  . FATIGUE 03/19/2007  . GOITER, MULTINODULAR 02/14/2007  . HX, PERSONAL, ALCOHOLISM 02/14/2007  . HYPERLIPIDEMIA 02/14/2007  . HYPERTENSION 02/14/2007  . OSTEOARTHRITIS 02/14/2007  . OSTEOPOROSIS 02/14/2007  . PANCREATITIS, HX OF 02/14/2007   Past Surgical History  Procedure Date  . Cataract extraction   . S/p jaw and periodontal surgury 2009    reports that she has been smoking.  She does not have any smokeless tobacco history on file. She reports that she drinks alcohol. Her drug history not on file. family history is not on file.  She is adopted. Allergies  Allergen Reactions  . Tetracycline    Current Outpatient  Prescriptions on File Prior to Visit  Medication Sig Dispense Refill  . Cholecalciferol (VITAMIN D) 1000 UNITS capsule Take 1,000 Units by mouth daily.        Marland Kitchen DISCONTD: ALPRAZolam (XANAX) 0.25 MG tablet Take 0.25 mg by mouth 3 (three) times daily as needed.        Marland Kitchen DISCONTD: losartan (COZAAR) 50 MG tablet Take 50 mg by mouth daily.        Marland Kitchen DISCONTD: lovastatin (MEVACOR) 40 MG tablet Take 40 mg by mouth daily.         Review of Systems Review of Systems  Constitutional: Negative for diaphoresis and unexpected weight change.  HENT: Negative for drooling and tinnitus.   Eyes: Negative for photophobia and visual disturbance.  Respiratory: Negative for choking and stridor.   Gastrointestinal: Negative for vomiting and blood in stool.  Genitourinary: Negative for hematuria and decreased urine volume.  Musculoskeletal: Negative for gait problem.  Skin: Negative for color change and wound.  Neurological: Negative for tremors and numbness.  Psychiatric/Behavioral: Negative for decreased concentration. The patient is not hyperactive.       Objective:   Physical Exam BP 122/78  Pulse 113  Temp(Src) 98.6 F (37 C) (Oral)  Ht 5' (1.524 m)  Wt 108 lb (48.988 kg)  BMI 21.09 kg/m2  SpO2 97% Physical Exam  VS noted, thin, somewhat frail Constitutional: Pt appears well-developed and well-nourished.  HENT: Head: Normocephalic.  Right Ear: External ear normal.  Left Ear: External ear normal.  Eyes: Conjunctivae and EOM are normal. Pupils are equal, round, and reactive to light.  Neck: Normal range of motion. Neck supple.  Cardiovascular: Normal rate and regular rhythm.   Pulmonary/Chest: Effort normal and breath sounds normal.  Abd:  Soft, NT, non-distended, + BS Neurological: Pt is alert. No cranial nerve deficit.  Skin: Skin is warm. No erythema.  Psychiatric: Pt behavior is normal. Thought content normal. 1+ nervous        Assessment & Plan:

## 2010-11-13 NOTE — Assessment & Plan Note (Signed)
To re-check, cont vit 3 otc 1000 qd

## 2010-11-13 NOTE — Patient Instructions (Signed)
Continue all other medications as before Please go to LAB in the Basement for the blood and/or urine tests to be done today Please call the phone number [phone] (the PhoneTree System) for results of testing in 2-3 days;  When calling, simply dial the number, and when prompted enter the MRN number above (the Medical Record Number) and the # key, then the message should start. Please return in 1 year for your yearly visit, or sooner if needed 

## 2010-11-13 NOTE — Assessment & Plan Note (Signed)
stable overall by hx and exam, most recent data reviewed with pt, and pt to continue medical treatment as before  BP Readings from Last 3 Encounters:  11/13/10 122/78  12/10/09 130/82  11/12/09 150/82

## 2010-11-13 NOTE — Assessment & Plan Note (Signed)
stable overall by hx and exam, most recent data reviewed with pt, and pt to continue medical treatment as before  Lab Results  Component Value Date   LDLCALC 115* 11/13/2010

## 2010-11-14 ENCOUNTER — Other Ambulatory Visit: Payer: Self-pay | Admitting: Radiology

## 2010-11-14 LAB — VITAMIN D 25 HYDROXY (VIT D DEFICIENCY, FRACTURES): Vit D, 25-Hydroxy: 59 ng/mL (ref 30–89)

## 2010-11-14 NOTE — Progress Notes (Signed)
Quick Note:  Voice message left on PhoneTree system - lab is negative, normal or otherwise stable, pt to continue same tx ______ 

## 2010-11-15 ENCOUNTER — Other Ambulatory Visit: Payer: Self-pay | Admitting: Radiology

## 2010-11-15 ENCOUNTER — Encounter: Payer: Self-pay | Admitting: Internal Medicine

## 2010-11-15 DIAGNOSIS — C50912 Malignant neoplasm of unspecified site of left female breast: Secondary | ICD-10-CM

## 2010-11-18 ENCOUNTER — Ambulatory Visit
Admission: RE | Admit: 2010-11-18 | Discharge: 2010-11-18 | Disposition: A | Discharge status: Home / Self Care | Payer: Medicare Other | Source: Ambulatory Visit | Attending: Radiology | Admitting: Radiology

## 2010-11-18 DIAGNOSIS — C50912 Malignant neoplasm of unspecified site of left female breast: Secondary | ICD-10-CM

## 2010-11-18 MED ORDER — GADOBENATE DIMEGLUMINE 529 MG/ML IV SOLN
9.0000 mL | Freq: Once | INTRAVENOUS | Status: AC | PRN
  Administered 2010-11-18: 9 mL via INTRAVENOUS

## 2010-11-20 ENCOUNTER — Other Ambulatory Visit: Payer: Self-pay | Admitting: Radiology

## 2010-11-20 ENCOUNTER — Encounter: Payer: Self-pay | Admitting: Internal Medicine

## 2010-11-21 ENCOUNTER — Encounter: Payer: Self-pay | Admitting: Internal Medicine

## 2010-11-25 ENCOUNTER — Encounter: Payer: Self-pay | Admitting: Internal Medicine

## 2010-11-26 ENCOUNTER — Ambulatory Visit (INDEPENDENT_AMBULATORY_CARE_PROVIDER_SITE_OTHER): Payer: Medicare Other | Admitting: General Surgery

## 2010-11-26 ENCOUNTER — Encounter (INDEPENDENT_AMBULATORY_CARE_PROVIDER_SITE_OTHER): Payer: Self-pay | Admitting: General Surgery

## 2010-11-26 ENCOUNTER — Other Ambulatory Visit (INDEPENDENT_AMBULATORY_CARE_PROVIDER_SITE_OTHER): Payer: Self-pay | Admitting: General Surgery

## 2010-11-26 VITALS — BP 140/97 | HR 113 | Temp 98.2°F | Ht 60.0 in | Wt 106.2 lb

## 2010-11-26 DIAGNOSIS — C50919 Malignant neoplasm of unspecified site of unspecified female breast: Secondary | ICD-10-CM

## 2010-11-26 DIAGNOSIS — C50912 Malignant neoplasm of unspecified site of left female breast: Secondary | ICD-10-CM

## 2010-11-26 NOTE — Progress Notes (Signed)
Subjective:     Patient ID: Tammy Wolfe, female   DOB: September 14, 1940, 70 y.o.   MRN: [id number]    BP 140/97  Pulse 113  Temp 98.2 F (36.8 C)  Ht 5' (1.524 m)  Wt 106 lb 3.2 oz (48.172 kg)  BMI 20.74 kg/m2    HPI This is a 70 year old female who has a prior history of prepectoral silicone breast implants. These both ruptured in 2010. They were then removed by Dr. Shon Hough. She has been followed with mammograms by Dr. Jeralyn Ruths. She has no complaints referable to her breasts except for this been very difficult to examine her due to multiple hard areas after the silicone implants ruptured. She underwent a regular routine screening mammogram on June 19. Due to the limitation of the silicone granulomas as well as a very dense nodular parenchyma she underwent supplemental imaging with breast specific gamma imaging. This showed bilateral abnormal activity. On the right breast there is a 1.4 cm focus of intensity in the upper outer quadrant. On the left there's a 1.6 cm focus noted at approximately 3:00. She then went back for focal spot compression views of the right upper outer quadrant and bilateral ultrasound. At this time she underwent an ultrasound guided vacuum assisted core biopsy of the left breast. 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 There is lymphovascular invasion identified. This is a HER-2/neu negative. This the estrogen receptors are positive at 100%, progesterone receptors 98%, Ki-67 is 23%. She has also undergone an MRI which shows masses in both sides of her breast. She then underwent a followup ultrasound guided core biopsy of the right breast for an abnormality which shows fibroadipose tissue. This is not concordant with what what Dr. Isabell Jarvis thought this would be. She was then referred for evaluation for breast cancer as well as the history of the implant rupture. Upon presentation she requested to have a bilateral mastectomy.  Past Medical History    Diagnosis Date  . ABNORMAL THYROID FUNCTION TESTS 12/10/2009  . ALCOHOLIC HEPATITIS, HX OF 02/14/2007  . ALLERGIC RHINITIS 02/14/2007  . ANXIETY 03/19/2007  . Breast implant removal status 12/23/2008  . BREAST MASS, RIGHT 12/20/2008  . CHEST PAIN 12/20/2008  . COLONIC POLYPS, HX OF 02/14/2007  . FATIGUE 03/19/2007  . GOITER, MULTINODULAR 02/14/2007  . HX, PERSONAL, ALCOHOLISM 02/14/2007  . HYPERLIPIDEMIA 02/14/2007  . HYPERTENSION 02/14/2007  . OSTEOARTHRITIS 02/14/2007  . OSTEOPOROSIS 02/14/2007  . PANCREATITIS, HX OF 02/14/2007  . Cancer     left breast    Past Surgical History  Procedure Date  . Cataract extraction   . S/p jaw and periodontal surgury 2009  . Peridontal 2010  . Breast surgery 2010    silicone prepectoral implants explanted by Dr. Shon Hough after rupture    Current outpatient prescriptions:ALPRAZolam (XANAX) 0.25 MG tablet, Take 1 tablet (0.25 mg total) by mouth 3 (three) times daily as needed., Disp: 90 tablet, Rfl: 5;  Cholecalciferol (VITAMIN D) 1000 UNITS capsule, Take 1,000 Units by mouth daily.  , Disp: , Rfl: ;  losartan (COZAAR) 50 MG tablet, Take 1 tablet (50 mg total) by mouth daily., Disp: 90 tablet, Rfl: 3 lovastatin (MEVACOR) 40 MG tablet, Take 1 tablet (40 mg total) by mouth daily., Disp: 90 tablet, Rfl: 3  Allergies  Allergen Reactions  . Tetracycline Anaphylaxis      Review of Systems  Constitutional: Negative.   HENT: Negative.   Respiratory: Negative.   Cardiovascular:  Htn   Gastrointestinal: Negative.   Genitourinary: Negative.   Musculoskeletal: Negative.   Neurological: Negative.   Hematological: Negative.        Objective:   Physical Exam  Constitutional: She appears well-developed and well-nourished.  Neck: Neck supple.  Cardiovascular: Normal rate, regular rhythm and normal heart sounds.   Pulmonary/Chest: Effort normal and breath sounds normal. No respiratory distress. She has no wheezes. She has no rales. Right breast  exhibits mass (multiple masses throughout right breast consistent with history of silicone, it is difficult to examine, no axilllary adenopathy). Right breast exhibits no inverted nipple, no nipple discharge, no skin change and no tenderness. Left breast exhibits inverted nipple and mass (multiple masses throughout appears dominant mass in left upper outer quadrant although difficult to examine again, no axillary adenopathy). Left breast exhibits no nipple discharge, no skin change and no tenderness.  Abdominal: Soft. There is no hepatomegaly.  Lymphadenopathy:    She has no cervical adenopathy.       Assessment:     Left breast cancer History of smoking History of implant rupture    Plan:     I discussed with the patient today for staging and pathophysiology of breast cancer. We discussed at length all of the radiologic studies she'smr her biopsies as well as her pathology report. I discussed all the treatments for breast cancer including surgery, chemotherapy, radiation therapy and anti-estrogen therapy. We discussed staging and its impact upon treatment. I told her that I think this is probably an early stage breast cancer but we would not know until after her surgery. We discussed surgery as her first treatment. I discussed her case previously with Dr. Yolanda Bonine. The patient had talked with Dr. Yolanda Bonine also previously and prior to her coming here she inquired about a bilateral mastectomy. I discussed with her all the options for breast cancer including breast conservation therapy. If we were going to pursue that we would need to have the lesion on the right side evaluated again. After discussing this with her she really would like to undergo a bilateral mastectomy. I do think is a reasonable option given her left-sided breast cancer as well as the difficulty in exam examining her as well as performing screening mammograms due to the silicone rupture. I would be concerned about doing a lumpectomy  and then possibly radiating her with the feeling of her breast right now due to the silicone. We also discussed a sentinel lymph node biopsy with possible axillary lymph node dissection from unable to identify a sentinel lymph node. We discussed the risks of these procedures being but not limited to bleeding, infection, lymphedema, shoulder pain, numbness around the shoulder as well as in the axilla, wound infections, and inability to heal her wound. I told her I was specifically concerned with the difficulty with wound healing due to her smoking and the free silicone is present in her breast right now and quarter about a 10% risk of having wound complications with this surgery. I told her that she would get presented at multidisciplinary breast conference next week. I medically and proceed to schedule her for bilateral mastectomies and sentinel lymph node biopsy on the left side at this time.

## 2010-12-03 ENCOUNTER — Encounter: Payer: Self-pay | Admitting: Internal Medicine

## 2010-12-06 ENCOUNTER — Encounter (HOSPITAL_COMMUNITY)
Admission: RE | Admit: 2010-12-06 | Discharge: 2010-12-06 | Disposition: A | Discharge status: Home / Self Care | Payer: Medicare Other | Source: Ambulatory Visit | Attending: General Surgery | Admitting: General Surgery

## 2010-12-06 ENCOUNTER — Other Ambulatory Visit (INDEPENDENT_AMBULATORY_CARE_PROVIDER_SITE_OTHER): Payer: Self-pay | Admitting: General Surgery

## 2010-12-06 ENCOUNTER — Ambulatory Visit (HOSPITAL_COMMUNITY)
Admission: RE | Admit: 2010-12-06 | Discharge: 2010-12-06 | Disposition: A | Discharge status: Home / Self Care | Payer: Medicare Other | Source: Ambulatory Visit | Attending: General Surgery | Admitting: General Surgery

## 2010-12-06 DIAGNOSIS — C50919 Malignant neoplasm of unspecified site of unspecified female breast: Secondary | ICD-10-CM

## 2010-12-06 DIAGNOSIS — Z01810 Encounter for preprocedural cardiovascular examination: Secondary | ICD-10-CM | POA: Insufficient documentation

## 2010-12-06 DIAGNOSIS — I10 Essential (primary) hypertension: Secondary | ICD-10-CM | POA: Insufficient documentation

## 2010-12-06 DIAGNOSIS — Z01812 Encounter for preprocedural laboratory examination: Secondary | ICD-10-CM | POA: Insufficient documentation

## 2010-12-06 DIAGNOSIS — F17200 Nicotine dependence, unspecified, uncomplicated: Secondary | ICD-10-CM | POA: Insufficient documentation

## 2010-12-06 DIAGNOSIS — J438 Other emphysema: Secondary | ICD-10-CM | POA: Insufficient documentation

## 2010-12-06 DIAGNOSIS — Z01818 Encounter for other preprocedural examination: Secondary | ICD-10-CM | POA: Insufficient documentation

## 2010-12-06 LAB — COMPREHENSIVE METABOLIC PANEL
AST: 40 U/L — ABNORMAL HIGH (ref 0–37)
BUN: 16 mg/dL (ref 6–23)
CO2: 25 mEq/L (ref 19–32)
Calcium: 9.4 mg/dL (ref 8.4–10.5)
Chloride: 104 mEq/L (ref 96–112)
Creatinine, Ser: 0.68 mg/dL (ref 0.50–1.10)
GFR calc Af Amer: >60 mL/min (ref >60)
GFR calc non Af Amer: >60 mL/min (ref >60)
Glucose, Bld: 138 mg/dL — ABNORMAL HIGH (ref 70–99)
Total Bilirubin: 0.7 mg/dL (ref 0.3–1.2)

## 2010-12-06 LAB — DIFFERENTIAL
Basophils Absolute: 0 10*3/uL (ref 0.0–0.1)
Basophils Relative: 0 % (ref 0–1)
Neutro Abs: 6.2 10*3/uL (ref 1.7–7.7)
Neutrophils Relative %: 78 % — ABNORMAL HIGH (ref 43–77)

## 2010-12-06 LAB — CBC
Hemoglobin: 11.8 g/dL — ABNORMAL LOW (ref 12.0–15.0)
RBC: 4.06 MIL/uL (ref 3.87–5.11)
WBC: 7.9 10*3/uL (ref 4.0–10.5)

## 2010-12-06 LAB — SURGICAL PCR SCREEN: Staphylococcus aureus: NEGATIVE

## 2010-12-06 LAB — CANCER ANTIGEN 27.29: CA 27.29: 34 U/mL (ref 0–39)

## 2010-12-10 ENCOUNTER — Other Ambulatory Visit (INDEPENDENT_AMBULATORY_CARE_PROVIDER_SITE_OTHER): Payer: Self-pay | Admitting: General Surgery

## 2010-12-10 ENCOUNTER — Inpatient Hospital Stay (HOSPITAL_COMMUNITY)
Admission: RE | Admit: 2010-12-10 | Discharge: 2010-12-10 | Disposition: A | Discharge status: Home / Self Care | Payer: Medicare Other | Source: Ambulatory Visit | Attending: General Surgery | Admitting: General Surgery

## 2010-12-10 ENCOUNTER — Inpatient Hospital Stay (HOSPITAL_COMMUNITY)
Admission: RE | Admit: 2010-12-10 | Discharge: 2010-12-14 | DRG: 580 | Disposition: A | Discharge status: Home / Self Care | Payer: Medicare Other | Source: Ambulatory Visit | Attending: General Surgery | Admitting: General Surgery

## 2010-12-10 DIAGNOSIS — M129 Arthropathy, unspecified: Secondary | ICD-10-CM | POA: Diagnosis present

## 2010-12-10 DIAGNOSIS — Y831 Surgical operation with implant of artificial internal device as the cause of abnormal reaction of the patient, or of later complication, without mention of misadventure at the time of the procedure: Secondary | ICD-10-CM | POA: Diagnosis present

## 2010-12-10 DIAGNOSIS — N6019 Diffuse cystic mastopathy of unspecified breast: Secondary | ICD-10-CM

## 2010-12-10 DIAGNOSIS — C50919 Malignant neoplasm of unspecified site of unspecified female breast: Principal | ICD-10-CM | POA: Diagnosis present

## 2010-12-10 DIAGNOSIS — F411 Generalized anxiety disorder: Secondary | ICD-10-CM | POA: Diagnosis present

## 2010-12-10 DIAGNOSIS — F17200 Nicotine dependence, unspecified, uncomplicated: Secondary | ICD-10-CM | POA: Diagnosis present

## 2010-12-10 DIAGNOSIS — I10 Essential (primary) hypertension: Secondary | ICD-10-CM | POA: Diagnosis present

## 2010-12-10 DIAGNOSIS — T8549XA Other mechanical complication of breast prosthesis and implant, initial encounter: Secondary | ICD-10-CM | POA: Diagnosis present

## 2010-12-10 DIAGNOSIS — Z79899 Other long term (current) drug therapy: Secondary | ICD-10-CM

## 2010-12-10 DIAGNOSIS — R920 Mammographic microcalcification found on diagnostic imaging of breast: Secondary | ICD-10-CM

## 2010-12-10 DIAGNOSIS — D62 Acute posthemorrhagic anemia: Secondary | ICD-10-CM | POA: Diagnosis not present

## 2010-12-10 DIAGNOSIS — C50912 Malignant neoplasm of unspecified site of left female breast: Secondary | ICD-10-CM

## 2010-12-10 MED ORDER — TECHNETIUM TC 99M SULFUR COLLOID FILTERED
1.0000 | Freq: Once | INTRAVENOUS | Status: AC | PRN
  Administered 2010-12-10: 1 via INTRADERMAL

## 2010-12-11 ENCOUNTER — Other Ambulatory Visit (INDEPENDENT_AMBULATORY_CARE_PROVIDER_SITE_OTHER): Payer: Self-pay | Admitting: General Surgery

## 2010-12-11 DIAGNOSIS — C50919 Malignant neoplasm of unspecified site of unspecified female breast: Secondary | ICD-10-CM

## 2010-12-11 LAB — CBC
Hemoglobin: 8.8 g/dL — ABNORMAL LOW (ref 12.0–15.0)
MCH: 28.4 pg (ref 26.0–34.0)
MCH: 28.6 pg (ref 26.0–34.0)
MCHC: 33.1 g/dL (ref 30.0–36.0)
MCHC: 33.6 g/dL (ref 30.0–36.0)
Platelets: 203 10*3/uL (ref 150–400)
RBC: 2.87 MIL/uL — ABNORMAL LOW (ref 3.87–5.11)
RDW: 14.8 % (ref 11.5–15.5)

## 2010-12-11 LAB — BASIC METABOLIC PANEL
BUN: 8 mg/dL (ref 6–23)
Calcium: 8.2 mg/dL — ABNORMAL LOW (ref 8.4–10.5)
GFR calc Af Amer: >60 mL/min (ref >60)
GFR calc non Af Amer: >60 mL/min (ref >60)
Glucose, Bld: 154 mg/dL — ABNORMAL HIGH (ref 70–99)
Potassium: 3.5 mEq/L (ref 3.5–5.1)
Sodium: 135 mEq/L (ref 135–145)

## 2010-12-11 NOTE — Op Note (Signed)
NAMEJIMIA, Tammy Wolfe NO.:  [id number]  MEDICAL RECORD NO.:  [id number]  LOCATION:  5158                         FACILITY:  [facility]  PHYSICIAN:  Juanetta Gosling, MDDATE OF BIRTH:  1941-01-16  DATE OF PROCEDURE:  12/10/2010 DATE OF DISCHARGE:                              OPERATIVE REPORT   PREOPERATIVE DIAGNOSES: 1. Left breast cancer, clinical stage I. 2. History of bilateral ruptured saline implants.  POSTOPERATIVE DIAGNOSES: 1. Left breast cancer, clinical stage I. 2. History of bilateral ruptured saline implants.  PROCEDURES: 1. Bilateral simple mastectomies. 2. Left axillary sentinel node biopsy.  SURGEON:  Juanetta Gosling, MD.  ASSISTANT:  None.  ANESTHESIA:  General.  SPECIMENS: 1. Bilateral breast tissue. 2. Left axillary sentinel node x2 with counts of 897 and 232.  DISPOSITION OF SPECIMEN:  Pathology.  DRAINS:  One 19-French Blake drain to each side.  ESTIMATED BLOOD LOSS:  Minimal. COMPLICATIONS:  None.  DISPOSITION:  To recovery room in stable condition.  HISTORY:  This is a 70 year old female with a history of prepectoral silicone implants that ruptured in 2010.  They were removed at that time.  It is very difficult to examine and seen on mammogram due to some remaining silicon.  She had an abnormal area on the right and the left recently.  She underwent biopsy of the area on the left as this was easily identifiable and the right, which showed a left breast cancer. The right was benign.  When I saw her, it was thought that the right one was not necessarily concordant with the way it appeared, but Ms. Wolfe desired only to have a bilateral mastectomy due to the painful nature of the silicone that had freely ruptured as well as her concern for breast cancer on the other side, and I thought this was a reasonable plan.  We discussed a bilateral mastectomies and left axillary sentinel node biopsy.  PROCEDURE IN DETAIL:  After  informed consent was obtained, the patient was first injected with technetium on the left side in the periareolar fashion.  She was then administered 1 g of intravenous cefazolin. Sequential compression devices were placed on the lower extremities prior to induction with anesthesia.  She was then placed under general anesthesia without complication.  Her bilateral breasts and entire left arm were prepped and draped in standard sterile surgical fashion. Surgical time-out was then performed.  I infiltrated 1 mL of saline methylene blue mixture in all four quadrants underneath, areola massages for 2 minutes.  I then made an elliptical incision including her nipple-areolar complex and developed flaps of the clavicle, sternum to the inframammary crease to the latissimus laterally.  This was somewhat difficult due to the silicone as well as her prior inframammary scar and a flaps superiorly were quite thin due to presence of some silicone as well as where her tumor was. Eventually, I went into the axilla and the breast was rolled laterallyoff the pectoralis muscle including the pectoralis fascia.  I was able to identify two hot sentinel nodes, one of which was also blue with the counts above.  There was no remaining activity in the axilla.  These were  then passed off the table and I did send these for a touch prep and these were negative for any cancer cells.  I then proceeded to irrigate. Hemostasis was then obtained.  I then inserted a 19-French Blake drain. I then closed this with 3-0 Vicryl, 4-0 Monocryl, Dermabond, and Steri- Strips.  A drain was functional upon completion.  I then moved to the other side.  I made elliptical incision again around the nipple-areolar complex.  I created flaps again to the clavicle, sternum, inframammary crease and latissimus laterally.  This side was much more difficult as there was a lot more silicone and capsule that was still present and I removed  all of this as well.  The breast was then rolled off the pectoralis muscle including the pectoralis fascia and some pectoralis muscle as there was silicone that was very adherent to this area as well.  This was eventually removed and passed off the table as a specimen.  Hemostasis was obtained on this side.  I then inserted a Blake drain on that side and secured it with a 2-0 nylon.  Following that, I closed this with 3-0 Vicryl, 4-0 Monocryl, Dermabond, and Steri-Strips.  Both drains were functional upon completion.  I placed ABD pads and a binder overlying that.  She tolerated this well, was extubated in the operating room, and transferred to the recovery room in stable condition.     Juanetta Gosling, MD     MCW/MEDQ  D:  12/10/2010  T:  12/11/2010  Job:  [id number]  cc:   Jeralyn Ruths, MD Corwin Levins, MD  Electronically Signed by Emelia Loron MD on 12/11/2010 07:42:29 PM

## 2010-12-12 LAB — CBC
Platelets: 216 10*3/uL (ref 150–400)
RBC: 2.67 MIL/uL — ABNORMAL LOW (ref 3.87–5.11)
RDW: 14.7 % (ref 11.5–15.5)
WBC: 5.6 10*3/uL (ref 4.0–10.5)

## 2010-12-12 LAB — ABO/RH: ABO/RH(D): O NEG

## 2010-12-12 LAB — PREPARE RBC (CROSSMATCH)

## 2010-12-13 ENCOUNTER — Telehealth (INDEPENDENT_AMBULATORY_CARE_PROVIDER_SITE_OTHER): Payer: Self-pay

## 2010-12-13 LAB — CBC
Hemoglobin: 9 g/dL — ABNORMAL LOW (ref 12.0–15.0)
MCHC: 34.2 g/dL (ref 30.0–36.0)
RBC: 3.13 MIL/uL — ABNORMAL LOW (ref 3.87–5.11)
WBC: 6.8 10*3/uL (ref 4.0–10.5)

## 2010-12-13 LAB — TYPE AND SCREEN: Antibody Screen: NEGATIVE

## 2010-12-13 NOTE — Telephone Encounter (Signed)
LMOM for pt telling her the po appt made for the pt to see DrTsuei while Dr Dwain Sarna out of town next wk/ [facility]

## 2010-12-19 ENCOUNTER — Ambulatory Visit (INDEPENDENT_AMBULATORY_CARE_PROVIDER_SITE_OTHER): Payer: Medicare Other | Admitting: Surgery

## 2010-12-19 ENCOUNTER — Encounter (INDEPENDENT_AMBULATORY_CARE_PROVIDER_SITE_OTHER): Payer: Self-pay | Admitting: Surgery

## 2010-12-19 VITALS — Temp 98.6°F

## 2010-12-19 DIAGNOSIS — C50919 Malignant neoplasm of unspecified site of unspecified female breast: Secondary | ICD-10-CM

## 2010-12-19 NOTE — Progress Notes (Signed)
This patient is 10 days status post bilateral mastectomies and removal of ruptured silicone. She had some issues initially with acute blood loss anemia but seems to be doing quite well. She was discharged home over the weekend. I am seeing her in Dr. Doreen Salvage absence. Her right drain is only putting out 20 cc per day and the left only put out 15 cc per day. Both incisions seem to be healing well with no sign of fluid collections behind the skin flaps. All skin flaps appeared viable. We removed both drains with no difficulty. The patient tolerated this well. She will keep dressings over both drain sites changing them daily. We will arrange for her to follow up with Dr. Dwain Sarna in about a week. She has appointment scheduled with Dr. Pierce Crane in medical oncology and Dr. Karoline Caldwell in radiation oncology.

## 2010-12-20 ENCOUNTER — Telehealth: Payer: Self-pay

## 2010-12-20 ENCOUNTER — Other Ambulatory Visit: Payer: Self-pay | Admitting: Oncology

## 2010-12-20 ENCOUNTER — Encounter (HOSPITAL_BASED_OUTPATIENT_CLINIC_OR_DEPARTMENT_OTHER): Payer: Medicare Other | Admitting: Oncology

## 2010-12-20 DIAGNOSIS — C50219 Malignant neoplasm of upper-inner quadrant of unspecified female breast: Secondary | ICD-10-CM

## 2010-12-20 DIAGNOSIS — C50419 Malignant neoplasm of upper-outer quadrant of unspecified female breast: Secondary | ICD-10-CM

## 2010-12-20 LAB — CBC WITH DIFFERENTIAL/PLATELET
Basophils Absolute: 0 10*3/uL (ref 0.0–0.1)
Eosinophils Absolute: 0 10*3/uL (ref 0.0–0.5)
HCT: 32.9 % — ABNORMAL LOW (ref 34.8–46.6)
HGB: 10.9 g/dL — ABNORMAL LOW (ref 11.6–15.9)
LYMPH%: 14.8 % (ref 14.0–49.7)
MONO#: 0.3 10*3/uL (ref 0.1–0.9)
NEUT#: 5.7 10*3/uL (ref 1.5–6.5)
Platelets: 603 10*3/uL — ABNORMAL HIGH (ref 145–400)
RBC: 3.73 10*6/uL (ref 3.70–5.45)
WBC: 7.1 10*3/uL (ref 3.9–10.3)

## 2010-12-20 LAB — COMPREHENSIVE METABOLIC PANEL
Albumin: 3.3 g/dL — ABNORMAL LOW (ref 3.5–5.2)
CO2: 21 mEq/L (ref 19–32)
Glucose, Bld: 95 mg/dL (ref 70–99)
Sodium: 141 mEq/L (ref 135–145)
Total Bilirubin: 0.4 mg/dL (ref 0.3–1.2)
Total Protein: 7.7 g/dL (ref 6.0–8.3)

## 2010-12-20 LAB — LACTATE DEHYDROGENASE: LDH: 184 U/L (ref 94–250)

## 2010-12-20 LAB — CANCER ANTIGEN 27.29: CA 27.29: 24 U/mL (ref 0–39)

## 2010-12-20 NOTE — Telephone Encounter (Signed)
Done per Norva Karvonen to call pt to arrange scheduling

## 2010-12-20 NOTE — Telephone Encounter (Signed)
Dr. Wilford Grist office called to request a Bone Density to be order for this patient before March 20, 2011.

## 2010-12-23 ENCOUNTER — Telehealth (INDEPENDENT_AMBULATORY_CARE_PROVIDER_SITE_OTHER): Payer: Self-pay

## 2010-12-23 NOTE — Telephone Encounter (Signed)
Pt calling b/c had some trouble with her breast drain site over the weekend. Pt was having some bleeding that she couldn't get to stop so she called the doctor on call who advised her to use a bandaide along with wrapping area with ace bandage and then put some ice on the area. This did get the drain site to stop bleeding but now pt was afraid to remove bandage till her next appt with Dr Dwain Sarna. I advised pt to go ahead and remove bandage today but if she had any problems to call me right away and I would have someone to take a look at her today./ [facility]

## 2010-12-25 ENCOUNTER — Ambulatory Visit
Admission: RE | Admit: 2010-12-25 | Discharge: 2010-12-25 | Disposition: A | Discharge status: Home / Self Care | Payer: Medicare Other | Source: Ambulatory Visit | Attending: Radiation Oncology | Admitting: Radiation Oncology

## 2010-12-25 DIAGNOSIS — M810 Age-related osteoporosis without current pathological fracture: Secondary | ICD-10-CM | POA: Insufficient documentation

## 2010-12-25 DIAGNOSIS — C50419 Malignant neoplasm of upper-outer quadrant of unspecified female breast: Secondary | ICD-10-CM | POA: Insufficient documentation

## 2010-12-25 DIAGNOSIS — I10 Essential (primary) hypertension: Secondary | ICD-10-CM | POA: Insufficient documentation

## 2010-12-25 DIAGNOSIS — E785 Hyperlipidemia, unspecified: Secondary | ICD-10-CM | POA: Insufficient documentation

## 2010-12-26 ENCOUNTER — Ambulatory Visit (INDEPENDENT_AMBULATORY_CARE_PROVIDER_SITE_OTHER): Payer: Medicare Other | Admitting: General Surgery

## 2010-12-26 ENCOUNTER — Encounter (INDEPENDENT_AMBULATORY_CARE_PROVIDER_SITE_OTHER): Payer: Self-pay | Admitting: General Surgery

## 2010-12-26 VITALS — HR 88 | Temp 97.2°F

## 2010-12-26 DIAGNOSIS — C50919 Malignant neoplasm of unspecified site of unspecified female breast: Secondary | ICD-10-CM

## 2010-12-26 DIAGNOSIS — Z09 Encounter for follow-up examination after completed treatment for conditions other than malignant neoplasm: Secondary | ICD-10-CM

## 2010-12-26 MED ORDER — HYDROCODONE-ACETAMINOPHEN 10-325 MG PO TABS: 1.0000 | ORAL_TABLET | Freq: Four times a day (QID) | ORAL | Status: DC | PRN

## 2010-12-26 NOTE — Progress Notes (Signed)
Subjective:     Patient ID: Tammy Wolfe, female   DOB: 15-Mar-1941, 70 y.o.   MRN: [id number]  HPI  This is a 70 year old female who presented with a newly diagnosed left breast cancer. She has a history of bilateral silicone implants that ruptured. These were explanted in the past. She had a lot of free silicone in both breasts making her diagnosis quite difficult. We eventually decided on performing bilateral mastectomies with the right being prophylactic. She underwent a left simple mastectomy with sentinel node biopsy and a right prophylactic mastectomy on July 17. This was grade 1 with 2 negative sentinel nodes and had an invasive ductal carcinoma measuring 1.4 cm. This was ER positive at 100% and PR positive at 98%. HER-2/neu was nonamplified and her proliferative index was 23%. Postoperatively she had a hematoma that developed on the right side. This side had a lot of free silicone that was intermingled with the pectoralis muscle and adherent to her ribs. She received a blood transfusion but eventually was discharged home doing well. She's doing well at home right now. She's had both of her drains removed at this point. She comes in today just complaining of some right-sided pain. She has seen both Dr. Basilio Cairo and Dr. Donnie Coffin postoperatively as well. The plan being to begin her on adjuvant hormonal therapy.  Review of Systems     Objective:   Physical Exam Bilateral mastectomy incisions without infections and viable flaps.  Her right inferior flap still has a moderate hematoma that is resolving.  The flap is viable over this and there is no infection.    Assessment:     Stage I left breast cancer    Plan:        I think her hematoma is resolving slowly and don't think there is anything else to do with this right now. I will have her come back and see me in one week. Discuss starting begin to do some exercises to her arms are a little bit stiff as well. She is begun tamoxifen and is due to see  Dr. Donnie Coffin again in 3 months.

## 2010-12-31 ENCOUNTER — Encounter (INDEPENDENT_AMBULATORY_CARE_PROVIDER_SITE_OTHER): Payer: Self-pay | Admitting: General Surgery

## 2010-12-31 ENCOUNTER — Ambulatory Visit (INDEPENDENT_AMBULATORY_CARE_PROVIDER_SITE_OTHER): Payer: Medicare Other | Admitting: General Surgery

## 2010-12-31 DIAGNOSIS — Z09 Encounter for follow-up examination after completed treatment for conditions other than malignant neoplasm: Secondary | ICD-10-CM

## 2010-12-31 NOTE — Patient Instructions (Signed)
May shower with dressings off. Change dressing on right side daily and as needed.  Use antibiotic ointment.  Then place nonstick pad over that with bandage over that.

## 2010-12-31 NOTE — Progress Notes (Signed)
Subjective:     Patient ID: Tammy Wolfe, female   DOB: August 08, 1940, 70 y.o.   MRN: [id number]  HPI This is a 70 year old female who presented with a newly diagnosed left breast cancer. She has a history of bilateral silicone implants that ruptured. These were explanted in the past. She had a lot of free silicone in both breasts making her diagnosis quite difficult. We eventually decided on performing bilateral mastectomies with the right being prophylactic. She underwent a left simple mastectomy with sentinel node biopsy and a right prophylactic mastectomy on July 17. This was grade 1 with 2 negative sentinel nodes and had an invasive ductal carcinoma measuring 1.4 cm. This was ER positive at 100% and PR positive at 98%. HER-2/neu was nonamplified and her proliferative index was 23%. Postoperatively she had a hematoma that developed on the right side. This side had a lot of free silicone that was intermingled with the pectoralis muscle and adherent to her ribs. She received a blood transfusion but eventually was discharged home doing well. She's doing well at home right now. She's had both of her drains removed at this point. She comes in today for a recheck of her wound.  She has some increased drainage from right mastectomy wound.  Review of Systems     Objective:   Physical Exam Left mastectomy wound clean without infection, no drainage, drain site healing Right mastectomy wound with some superficial necrosis centrally, no infection, flaps o/w viable, hematoma resolving quickly    Assessment:     Superficial necrosis mastectomy wound    Plan:        I'm not surprised if she has some superficial necrosis of this right mastectomy wound. I told her that this area will heal over the next several weeks. They're going to apply some antibiotic ointment and change his dressing daily. I  will plan on seeing her back in 2 weeks or sooner if needed.

## 2011-01-03 NOTE — Discharge Summary (Signed)
  NAMETEYA, Tammy Wolfe NO.:  [id number]  MEDICAL RECORD NO.:  [id number]  LOCATION:  5158                         FACILITY:  [facility]  PHYSICIAN:  Juanetta Gosling, MDDATE OF BIRTH:  29-Sep-1940  DATE OF ADMISSION:  12/10/2010 DATE OF DISCHARGE:  12/14/2010                              DISCHARGE SUMMARY   ADMISSION DIAGNOSES: 1. Left breast cancer. 2. Prior history of ruptured silicone breast implants. 3. Anxiety. 4. History of colonic polyps. 5. Multinodular goiter. 6. Hyperlipidemia. 7. Hypertension. 8. Osteoarthritis. 9. Osteoporosis.  DISCHARGE DIAGNOSES: 1. Left breast cancer. 2. Prior history of ruptured silicone breast implants. 3. Anxiety. 4. History of colonic polyps. 5. Multinodular goiter. 6. Hyperlipidemia. 7. Hypertension. 8. Osteoarthritis. 9. Osteoporosis. 10.Acute blood loss anemia.  HISTORY AND HOSPITAL COURSE:  This is a 70 year old female with a newly diagnosed left breast cancer and history of prepectoral silicone breast implants that had ruptured.  These had been removed in the past.  She and I discussed multiple options for breast cancer.  Due to the fact she was very difficult for examination, had some pain from her silicone areas and was difficult to follow mammographically, me decided on a bilateral mastectomy with sentinel node on the affected side.  She underwent the bilateral simple mastectomy the left axillary sentinel node biopsy on December 10, 2010.  Of note on the right side, she had a lot of free silicone and I actually had to shave some of the silicone off the ribs as well as removing some of the pectoralis muscle as it was adherent to that.  Postoperatively, she was placed on the floor.  She had some bleeding from her right drain site the first postoperative night and her hematocrit was decreased.  This eventually stabilized out and she did not have any episode of bleeding, although she had did have a right-sided  flap hematoma.  I followed her hematocrit and her drains, and she did well.  Her skin remained viable overlying this.  Her hematocrit was 22.6 couple of days postoperatively and I did give her a unit of blood due to her symptoms at that point.  She got better over the next 48 hours and did not have any further bleeding and was then discharged home.  Her pathology showed invasive ductal carcinoma measuring 1.4 cm on the left side with negative nodes.  The right side showed a radial scar, giving her a stage I, ER/PR positive left breast cancer.  PERTINENT LABORATORY EVALUATION:  Her hematocrit was 22.6, the lowest was 26.3 on discharge.  PERTINENT RADIOLOGIC EVALUATION:  None.  MEDICATIONS UPON DISCHARGE: 1. Xanax. 2. Cozaar. 3. Lovastatin. 4. Percocet as needed.  DISCHARGE INSTRUCTIONS:  Per [facility] instruction sheet.  Follow up with Dr. Dwain Sarna in 1 week.     Juanetta Gosling, MD     MCW/MEDQ  D:  01/02/2011  T:  01/02/2011  Job:  [id number]  Electronically Signed by Emelia Loron MD on 01/03/2011 04:01:28 AM

## 2011-01-06 ENCOUNTER — Other Ambulatory Visit (INDEPENDENT_AMBULATORY_CARE_PROVIDER_SITE_OTHER): Payer: Self-pay

## 2011-01-06 DIAGNOSIS — Z09 Encounter for follow-up examination after completed treatment for conditions other than malignant neoplasm: Secondary | ICD-10-CM

## 2011-01-06 MED ORDER — HYDROCODONE-ACETAMINOPHEN 10-325 MG PO TABS: 1.0000 | ORAL_TABLET | Freq: Four times a day (QID) | ORAL | Status: DC | PRN

## 2011-01-06 NOTE — Telephone Encounter (Signed)
Returned pt's voicemal message requesting RF on Norco 10-325mg . Ok per Dr Dwain Sarna to refill #30 to [facility]Hulda Humphrey

## 2011-01-08 ENCOUNTER — Encounter (INDEPENDENT_AMBULATORY_CARE_PROVIDER_SITE_OTHER): Payer: Self-pay | Admitting: Internal Medicine

## 2011-01-13 ENCOUNTER — Ambulatory Visit (INDEPENDENT_AMBULATORY_CARE_PROVIDER_SITE_OTHER): Payer: Medicare Other | Admitting: General Surgery

## 2011-01-13 ENCOUNTER — Encounter (INDEPENDENT_AMBULATORY_CARE_PROVIDER_SITE_OTHER): Payer: Self-pay | Admitting: General Surgery

## 2011-01-13 DIAGNOSIS — Z09 Encounter for follow-up examination after completed treatment for conditions other than malignant neoplasm: Secondary | ICD-10-CM

## 2011-01-13 NOTE — Progress Notes (Signed)
Subjective:     Patient ID: Tammy Wolfe, female   DOB: 04-18-1941, 70 y.o.   MRN: [id number]  HPI This is a 70 year old female who underwent bilateral mastectomies. One was prophylactic the other was for cancer. She has had some issues with wound healing. She had a hematoma on one side which eventually resolved. She has some superficial necrosis of the right side which is now pretty much healed she had some foul-smelling drainage from the left side for which he comes in today. She otherwise is doing well no complaints.  Review of Systems     Objective:   Physical Exam Healing right sided mastectomy incision, hematoma resolved Left sided mastectomy incision without infection, has several superficial openings that I debrided today    Assessment:     S/p bilateral mastectomies    Plan:     Continue dressing changes as she has been doing. Will return in two weeks.

## 2011-01-20 ENCOUNTER — Telehealth (INDEPENDENT_AMBULATORY_CARE_PROVIDER_SITE_OTHER): Payer: Self-pay

## 2011-01-20 DIAGNOSIS — C50919 Malignant neoplasm of unspecified site of unspecified female breast: Secondary | ICD-10-CM

## 2011-01-20 MED ORDER — HYDROCODONE-ACETAMINOPHEN 5-325 MG PO TABS: 1.0000 | ORAL_TABLET | ORAL | Status: DC | PRN

## 2011-01-20 NOTE — Telephone Encounter (Signed)
Called pt to notify her that we refilled her Norco Rx at [facility]

## 2011-01-29 ENCOUNTER — Ambulatory Visit (INDEPENDENT_AMBULATORY_CARE_PROVIDER_SITE_OTHER): Payer: Medicare Other | Admitting: General Surgery

## 2011-01-29 ENCOUNTER — Encounter (INDEPENDENT_AMBULATORY_CARE_PROVIDER_SITE_OTHER): Payer: Self-pay | Admitting: General Surgery

## 2011-01-29 VITALS — BP 138/88 | HR 92

## 2011-01-29 DIAGNOSIS — Z09 Encounter for follow-up examination after completed treatment for conditions other than malignant neoplasm: Secondary | ICD-10-CM

## 2011-01-29 NOTE — Progress Notes (Signed)
Subjective:     Patient ID: Tammy Wolfe, female   DOB: 06/19/1940, 70 y.o.   MRN: [id number]  HPI This is a 70 year old female who is status post bilateral simple mastectomies and a right-sided sentinel node biopsy for breast cancer. She comes in today just for additional wound check. She had a hematoma on the right side as well as some superficial skin breakdown on both sides. She is doing well right now and  complains of some only some pain at her upper portion of her flaps at all otherwise all this is getting better.   Review of Systems     Objective:   Physical Exam Healing bilateral mastectomy incisions without infection, some small areas of very superficial skin breakdown nearly healed    Assessment:     S/p bilateral mastectomies with superficial necrosis    Plan:     Continue dressings will return in one month

## 2011-02-12 ENCOUNTER — Telehealth (INDEPENDENT_AMBULATORY_CARE_PROVIDER_SITE_OTHER): Payer: Self-pay

## 2011-02-12 DIAGNOSIS — C50919 Malignant neoplasm of unspecified site of unspecified female breast: Secondary | ICD-10-CM

## 2011-02-12 MED ORDER — HYDROCODONE-ACETAMINOPHEN 5-325 MG PO TABS: 1.0000 | ORAL_TABLET | ORAL | Status: DC | PRN

## 2011-02-12 NOTE — Telephone Encounter (Signed)
error 

## 2011-02-12 NOTE — Telephone Encounter (Signed)
Returned [employer] message about refill on Vicodin. Per Dr Dwain Sarna it is ok to refill the Vicodin #30 efiled to [facility]

## 2011-03-03 ENCOUNTER — Encounter (INDEPENDENT_AMBULATORY_CARE_PROVIDER_SITE_OTHER): Payer: Self-pay | Admitting: General Surgery

## 2011-03-03 ENCOUNTER — Ambulatory Visit (INDEPENDENT_AMBULATORY_CARE_PROVIDER_SITE_OTHER): Payer: Medicare Other | Admitting: General Surgery

## 2011-03-03 VITALS — BP 128/88 | HR 60 | Temp 98.3°F | Resp 16 | Ht 60.0 in | Wt 91.4 lb

## 2011-03-03 DIAGNOSIS — Z09 Encounter for follow-up examination after completed treatment for conditions other than malignant neoplasm: Secondary | ICD-10-CM

## 2011-03-03 MED ORDER — HYDROCODONE-ACETAMINOPHEN 10-325 MG PO TABS: 1.0000 | ORAL_TABLET | Freq: Four times a day (QID) | ORAL | Status: DC | PRN

## 2011-03-03 NOTE — Progress Notes (Signed)
Subjective:     Patient ID: Tammy Wolfe, female   DOB: 1941-04-19, 70 y.o.   MRN: [id number]  HPI This is a 70 year old female who I did bilateral mastectomies on. She's had some difficulty with wound healing. These areas are now completely healed. The only complaint she has right now is that she has some soreness on her both of her axillas. She comes in today just for final wound check.  Review of Systems     Objective:   Physical Exam Well healed incision bilaterally, no infection    Assessment:     S/p bilateral mastectomies    Plan:        I discussed that I think she has a neuropathic pain under both of her arms. She was a very difficult mastectomy. I am going to send her to PT. I think this would be helpful for some of her pain. I also told her it would be fine to go get a postmastectomy bra and inserts. I refilled her Vicodin today. She is going to come back and see me in 6 months or sooner if needed.

## 2011-03-04 ENCOUNTER — Ambulatory Visit (INDEPENDENT_AMBULATORY_CARE_PROVIDER_SITE_OTHER)
Admission: RE | Admit: 2011-03-04 | Discharge: 2011-03-04 | Disposition: A | Discharge status: Home / Self Care | Payer: Medicare Other | Source: Ambulatory Visit

## 2011-03-04 DIAGNOSIS — M810 Age-related osteoporosis without current pathological fracture: Secondary | ICD-10-CM

## 2011-03-06 ENCOUNTER — Ambulatory Visit: Payer: Medicare Other | Attending: General Surgery | Admitting: Physical Therapy

## 2011-03-06 DIAGNOSIS — M79609 Pain in unspecified limb: Secondary | ICD-10-CM | POA: Insufficient documentation

## 2011-03-06 DIAGNOSIS — IMO0001 Reserved for inherently not codable concepts without codable children: Secondary | ICD-10-CM | POA: Insufficient documentation

## 2011-03-12 ENCOUNTER — Encounter: Payer: Self-pay | Admitting: Internal Medicine

## 2011-03-13 ENCOUNTER — Telehealth: Payer: Self-pay | Admitting: Internal Medicine

## 2011-03-13 NOTE — Telephone Encounter (Signed)
Pt advised of DXA scan result and MD recommendations of start Prolia. Pt decided to discuss to Oncologist.

## 2011-03-17 ENCOUNTER — Ambulatory Visit: Payer: Medicare Other | Admitting: Physical Therapy

## 2011-03-19 ENCOUNTER — Ambulatory Visit: Payer: Medicare Other | Admitting: Physical Therapy

## 2011-03-20 ENCOUNTER — Other Ambulatory Visit: Payer: Self-pay | Admitting: Oncology

## 2011-03-20 ENCOUNTER — Encounter (HOSPITAL_BASED_OUTPATIENT_CLINIC_OR_DEPARTMENT_OTHER): Payer: Medicare Other | Admitting: Oncology

## 2011-03-20 DIAGNOSIS — C50919 Malignant neoplasm of unspecified site of unspecified female breast: Secondary | ICD-10-CM

## 2011-03-20 DIAGNOSIS — Z170 Estrogen receptor positive status [ER+]: Secondary | ICD-10-CM

## 2011-03-20 DIAGNOSIS — C50219 Malignant neoplasm of upper-inner quadrant of unspecified female breast: Secondary | ICD-10-CM

## 2011-03-20 DIAGNOSIS — Z79810 Long term (current) use of selective estrogen receptor modulators (SERMs): Secondary | ICD-10-CM

## 2011-03-20 DIAGNOSIS — C50419 Malignant neoplasm of upper-outer quadrant of unspecified female breast: Secondary | ICD-10-CM

## 2011-03-20 DIAGNOSIS — N390 Urinary tract infection, site not specified: Secondary | ICD-10-CM

## 2011-03-20 LAB — URINALYSIS, MICROSCOPIC - CHCC
Nitrite: NEGATIVE
Protein: <30 mg/dL
Specific Gravity, Urine: 1.025 (ref 1.003–1.035)
pH: 6 (ref 4.6–8.0)

## 2011-03-20 LAB — CBC WITH DIFFERENTIAL/PLATELET
BASO%: 0.3 % (ref 0.0–2.0)
Basophils Absolute: 0 10*3/uL (ref 0.0–0.1)
HCT: 36.8 % (ref 34.8–46.6)
HGB: 12.3 g/dL (ref 11.6–15.9)
LYMPH%: 19.2 % (ref 14.0–49.7)
MCH: 27.1 pg (ref 25.1–34.0)
MCHC: 33.5 g/dL (ref 31.5–36.0)
MONO#: 0.3 10*3/uL (ref 0.1–0.9)
NEUT%: 76.1 % (ref 38.4–76.8)
Platelets: 253 10*3/uL (ref 145–400)
WBC: 9.5 10*3/uL (ref 3.9–10.3)

## 2011-03-20 LAB — COMPREHENSIVE METABOLIC PANEL
ALT: 15 U/L (ref 0–35)
Albumin: 4 g/dL (ref 3.5–5.2)
CO2: 19 mEq/L (ref 19–32)
Calcium: 9 mg/dL (ref 8.4–10.5)
Chloride: 111 mEq/L (ref 96–112)
Glucose, Bld: 107 mg/dL — ABNORMAL HIGH (ref 70–99)
Potassium: 4.4 mEq/L (ref 3.5–5.3)
Sodium: 141 mEq/L (ref 135–145)
Total Bilirubin: 0.3 mg/dL (ref 0.3–1.2)
Total Protein: 7 g/dL (ref 6.0–8.3)

## 2011-03-24 ENCOUNTER — Encounter: Payer: Medicare Other | Admitting: Physical Therapy

## 2011-03-24 LAB — URINE CULTURE

## 2011-03-26 ENCOUNTER — Ambulatory Visit: Payer: Medicare Other | Admitting: Physical Therapy

## 2011-04-03 ENCOUNTER — Ambulatory Visit: Payer: Medicare Other | Attending: General Surgery | Admitting: Physical Therapy

## 2011-04-03 DIAGNOSIS — M79609 Pain in unspecified limb: Secondary | ICD-10-CM | POA: Insufficient documentation

## 2011-04-03 DIAGNOSIS — IMO0001 Reserved for inherently not codable concepts without codable children: Secondary | ICD-10-CM | POA: Insufficient documentation

## 2011-04-10 ENCOUNTER — Telehealth (INDEPENDENT_AMBULATORY_CARE_PROVIDER_SITE_OTHER): Payer: Self-pay

## 2011-04-10 MED ORDER — HYDROCODONE-ACETAMINOPHEN 10-325 MG PO TABS: 1.0000 | ORAL_TABLET | Freq: Four times a day (QID) | ORAL | Status: DC | PRN

## 2011-04-10 NOTE — Telephone Encounter (Signed)
Called pt to notify her that we refilled Vicodin Rx to [facility] per DR Dwain Sarna.Hulda Humphrey

## 2011-04-22 NOTE — Progress Notes (Signed)
CC:   Corwin Levins, MD Juanetta Gosling, MD Grayland Jack, M.D.  PROBLEM:  History of breast cancer, ER/PR positive, HER-2 negative, status post bilateral mastectomies, currently on tamoxifen.  Ms. Aguilera returns for followup.  She has been on tamoxifen for the past 4 months.  She did not require any adjuvant radiation therapy.  She has recovered well from her surgeries.  She has had no other intercurrent illnesses or any complaints related to her tamoxifen.  ECOG status is 0.  MEDICATIONS:  She continues on vitamin D3, Cozaar, hydrocodone, Xanax.  PHYSICAL EXAMINATION:  General:  Today a pleasant alert woman, looking stated age.  Vital Signs:  She is somewhat tachycardic today with a heart rate of 120.  Temperature 98.4, blood pressure 131/94, weight is 93 pounds.  Head and Neck Exam:  Unremarkable.  Trachea midline.  No thyromegaly.  Extraocular movements are normal.  Lungs:  Clear.  Heart: Heart sounds are normal.  Breasts:  Status post bilateral mastectomies. Surgical scars are healed well.  Both axillae are negative.  Abdomen: No palpable hepatosplenomegaly.  No inguinal adenopathy.  Extremities: No peripheral edema.  LABORATORIES:  Labs from 10/25 are within normal limits.  She was having some UTI-type symptoms and noted to have significant pyuria.  IMPRESSION AND PLAN:  Ms. Overstreet is doing well.  I have her a prescription today for an antibiotics for UTI.  She is tolerating tamoxifen well.  We will review bone density test results when she returns in 6 month's time to make a decision about switching her to an AI.  Other than that, she seems to be doing well with no other complications at the present time from  her surgery or tamoxifen therapy.    ______________________________ Pierce Crane, M.D., F.R.C.P.C. PR/MEDQ  D:  04/22/2011  T:  04/22/2011  Job:  [id number]

## 2011-04-25 ENCOUNTER — Telehealth: Payer: Self-pay | Admitting: Internal Medicine

## 2011-04-25 ENCOUNTER — Telehealth: Payer: Self-pay | Admitting: *Deleted

## 2011-04-25 NOTE — Telephone Encounter (Signed)
Pt. Called.  She saw Dr. Donnie Coffin in October and he gave her sulfa for a UTI.  She now has another UTI with painful urination and frequency that started 3 days ago.   She wonders if he would give her another antibiotic.  She is not undergoing chemotherapy.  She is on tamoxifen.  She does have a PCP/"Dr. Jonny Ruiz at [facility]".  She has already called them and they wanted her to go to the clinic on Saturday am.   Discussed with Debbora Presto PA.  Pt. Does need to go to her PCP and will probably need a  Culture..  Called pt back and she said she knew she probably needed to see her PCP, but was just checking with [place].  She appreciated the call back.

## 2011-04-25 NOTE — Telephone Encounter (Signed)
Please consider OV Saturday clinic

## 2011-04-25 NOTE — Telephone Encounter (Signed)
Patient called and stated she has a UTI she is burning when she urinates and also frequent urination.  Please advise.

## 2011-04-28 ENCOUNTER — Telehealth: Payer: Self-pay

## 2011-04-28 MED ORDER — CEPHALEXIN 500 MG PO CAPS: 500.0000 mg | ORAL_CAPSULE | Freq: Four times a day (QID) | ORAL | Status: AC

## 2011-04-28 NOTE — Telephone Encounter (Signed)
Called the patient and she is still having problems and scheduled appt. Today with JWJ

## 2011-04-28 NOTE — Telephone Encounter (Signed)
The patient called on Friday with UTI symptoms.  Called back this morning and she is still having a problem, could not schedule appt. Due to work, Could this patient go to the lab and have urine checked, please advise

## 2011-04-28 NOTE — Telephone Encounter (Signed)
Ok antibx this time, but let pt we will need to see in office if not starting to improve in 1-2 days

## 2011-04-28 NOTE — Telephone Encounter (Signed)
Called the patient left message to call back 

## 2011-04-28 NOTE — Telephone Encounter (Signed)
Patient informed. 

## 2011-04-29 ENCOUNTER — Ambulatory Visit: Payer: Medicare Other | Admitting: Internal Medicine

## 2011-06-17 ENCOUNTER — Other Ambulatory Visit: Payer: Self-pay | Admitting: Internal Medicine

## 2011-06-17 NOTE — Telephone Encounter (Signed)
Faxed hardcopy to pharmacy. 

## 2011-08-13 ENCOUNTER — Other Ambulatory Visit: Payer: Self-pay | Admitting: Endocrinology

## 2011-08-13 NOTE — Telephone Encounter (Signed)
Done hardcopy to robin  

## 2011-08-13 NOTE — Telephone Encounter (Signed)
Faxed hardcopy to pharmacy. 

## 2011-09-01 ENCOUNTER — Ambulatory Visit (INDEPENDENT_AMBULATORY_CARE_PROVIDER_SITE_OTHER): Payer: Medicare Other | Admitting: General Surgery

## 2011-09-01 ENCOUNTER — Encounter (INDEPENDENT_AMBULATORY_CARE_PROVIDER_SITE_OTHER): Payer: Self-pay | Admitting: General Surgery

## 2011-09-01 VITALS — BP 118/78 | HR 66 | Temp 97.4°F | Resp 18 | Ht 60.0 in | Wt 94.0 lb

## 2011-09-01 DIAGNOSIS — Z853 Personal history of malignant neoplasm of breast: Secondary | ICD-10-CM | POA: Diagnosis not present

## 2011-09-01 NOTE — Progress Notes (Signed)
Subjective:     Patient ID: Tammy Wolfe, female   DOB: Dec 20, 1940, 71 y.o.   MRN: [id number]  HPI This is a 71 year old female who underwent bilateral simple mastectomies and sentinel node biopsy on her affected side for stage I left breast cancer. She has had difficulties postoperatively due to a fair amount of free silicone and hematoma. Eventually her wounds healed. She returns today doing well without any complaints since our last visit. She has no complaints about any masses or any problems with her incisions. She has been started on tamoxifen which she is tolerating without any difficulties except some very occasional hot flashes. She comes in today for a 6 month followup.  Review of Systems     Objective:   Physical Exam  Vitals reviewed. Constitutional: She appears well-developed and well-nourished.  Pulmonary/Chest: Right breast exhibits no mass and no tenderness. Left breast exhibits no mass and no tenderness.    Lymphadenopathy:    She has no cervical adenopathy.    She has no axillary adenopathy.       Right: No supraclavicular adenopathy present.       Left: No supraclavicular adenopathy present.       Assessment:     Stage I left breast cancer s/p bilateral sm, left ax snbx    Plan:     She has no clinical evidence of recurrence today. She is moving her arm very well and is been seen by physical therapy. She's going to continue doing her own exams. She's going to continue her tamoxifen. I told her I would see her annually and and I will let medical oncology office knows she will need to be seen for followup for tamoxifen.

## 2011-09-18 ENCOUNTER — Other Ambulatory Visit (HOSPITAL_BASED_OUTPATIENT_CLINIC_OR_DEPARTMENT_OTHER): Payer: Medicare Other | Admitting: Lab

## 2011-09-18 ENCOUNTER — Ambulatory Visit (HOSPITAL_BASED_OUTPATIENT_CLINIC_OR_DEPARTMENT_OTHER): Payer: Medicare Other | Admitting: Oncology

## 2011-09-18 ENCOUNTER — Telehealth: Payer: Self-pay | Admitting: Oncology

## 2011-09-18 VITALS — BP 125/83 | HR 91 | Temp 97.9°F | Ht 60.0 in | Wt 93.5 lb

## 2011-09-18 DIAGNOSIS — E559 Vitamin D deficiency, unspecified: Secondary | ICD-10-CM

## 2011-09-18 DIAGNOSIS — C50219 Malignant neoplasm of upper-inner quadrant of unspecified female breast: Secondary | ICD-10-CM | POA: Diagnosis not present

## 2011-09-18 DIAGNOSIS — N3000 Acute cystitis without hematuria: Secondary | ICD-10-CM | POA: Diagnosis not present

## 2011-09-18 DIAGNOSIS — Z170 Estrogen receptor positive status [ER+]: Secondary | ICD-10-CM

## 2011-09-18 DIAGNOSIS — C50419 Malignant neoplasm of upper-outer quadrant of unspecified female breast: Secondary | ICD-10-CM | POA: Diagnosis not present

## 2011-09-18 DIAGNOSIS — C50919 Malignant neoplasm of unspecified site of unspecified female breast: Secondary | ICD-10-CM

## 2011-09-18 DIAGNOSIS — M810 Age-related osteoporosis without current pathological fracture: Secondary | ICD-10-CM

## 2011-09-18 LAB — CBC WITH DIFFERENTIAL/PLATELET
BASO%: 0.8 % (ref 0.0–2.0)
Basophils Absolute: 0.1 10*3/uL (ref 0.0–0.1)
HCT: 36.8 % (ref 34.8–46.6)
HGB: 11.9 g/dL (ref 11.6–15.9)
MCHC: 32.4 g/dL (ref 31.5–36.0)
MONO#: 0.3 10*3/uL (ref 0.1–0.9)
NEUT%: 70 % (ref 38.4–76.8)
RDW: 15.2 % — ABNORMAL HIGH (ref 11.2–14.5)
WBC: 7.2 10*3/uL (ref 3.9–10.3)
lymph#: 1.7 10*3/uL (ref 0.9–3.3)

## 2011-09-18 NOTE — Telephone Encounter (Signed)
gve the pt her oct 2013 appt calendar °

## 2011-09-18 NOTE — Progress Notes (Signed)
Hematology and Oncology Follow Up Visit  Tammy Wolfe [id number] 1941-03-02 71 y.o. 09/18/2011 1:23 PM   DIAGNOSIS: 71 year old woman with history of bilateral mastectomies performed 12/10/2010. Right side showed a 1.4 cm grade 1 ER/PR positive breast cancer, HER-2 negative. On tamoxifen since.  Encounter Diagnoses  Name Primary?  . Breast cancer Yes  . Unspecified vitamin D deficiency      PAST THERAPY:  Bilateral mastectomies 12/10/2010  Interim History:  She is tolerating tamoxifen well. She has no complaints appetite good weight stable. She did have a bone density test done back in October of 2012 which showed a T score of -1.9 in the spine and -2.6 in the femur of the left breast in the right. She had previously been on Fosamax which was stopped because of periodontal disease. Hence as she is now on tamoxifen to help destabilizing buildup on her bone density. Essentially your risk of recurrence is quite low as well. She is taking calcium vitamin D supplementation.  Medications: I have reviewed the patient's current medications.  Allergies:  Allergies  Allergen Reactions  . Tetracycline Anaphylaxis    Past Medical History, Surgical history, Social history, and Family History were reviewed and updated.  Review of Systems: Constitutional:  Negative for fever, chills, night sweats, anorexia, weight loss, pain. Cardiovascular: negative Respiratory: no cough, shortness of breath, or wheezing Neurological: negative Dermatological: negative ENT: negative Skin Gastrointestinal: negative Genito-Urinary: negative Hematological and Lymphatic: negative Breast: negative Musculoskeletal: negative Remaining ROS negative.  Physical Exam:  Blood pressure 125/83, pulse 91, temperature 97.9 F (36.6 C), height 5' (1.524 m), weight 93 lb 8 oz (42.411 kg).  ECOG: 0   General appearance: alert, cooperative and appears stated age Eyes: conjunctivae/corneas clear. PERRL, EOM's intact.  Fundi benign. Throat: lips, mucosa, and tongue normal; teeth and gums normal Resp: clear to auscultation bilaterally and normal percussion bilaterally Breasts: status post mastectomies, surgical scars are healing well and is no evidence of local recurrence. Both axilla negative. Cardio: regular rate and rhythm, S1, S2 normal, no murmur, click, rub or gallop and normal apical impulse GI: soft, non-tender; bowel sounds normal; no masses,  no organomegaly Extremities: extremities normal, atraumatic, no cyanosis or edema Pulses: 2+ and symmetric Lymph nodes: Cervical, supraclavicular, and axillary nodes normal. Neurologic: Grossly normal   Lab Results: Lab Results  Component Value Date   WBC 7.2 09/18/2011   HGB 11.9 09/18/2011   HCT 36.8 09/18/2011   MCV 86.8 09/18/2011   PLT 274 09/18/2011     Chemistry      Component Value Date/Time   NA 141 03/20/2011 1115   K 4.4 03/20/2011 1115   CL 111 03/20/2011 1115   CO2 19 03/20/2011 1115   BUN 15 03/20/2011 1115   CREATININE 0.75 03/20/2011 1115      Component Value Date/Time   CALCIUM 9.0 03/20/2011 1115   ALKPHOS 63 03/20/2011 1115   AST 25 03/20/2011 1115   ALT 15 03/20/2011 1115   BILITOT 0.3 03/20/2011 1115       Radiological Studies:  No results found.   IMPRESSIONS AND PLAN: A 71 y.o. female with Node-negative ER/PR positive breast cancer and adjuvant tamoxifen. I will see her in followup in 6 months.   Spent more than half the time coordinating care.    Marshon Bangs 4/25/20131:23 PM

## 2011-09-19 LAB — COMPREHENSIVE METABOLIC PANEL
AST: 20 U/L (ref 0–37)
Albumin: 4.2 g/dL (ref 3.5–5.2)
Alkaline Phosphatase: 53 U/L (ref 39–117)
BUN: 20 mg/dL (ref 6–23)
Calcium: 8.6 mg/dL (ref 8.4–10.5)
Chloride: 114 mEq/L — ABNORMAL HIGH (ref 96–112)
Potassium: 3.4 mEq/L — ABNORMAL LOW (ref 3.5–5.3)
Sodium: 143 mEq/L (ref 135–145)
Total Protein: 6.8 g/dL (ref 6.0–8.3)

## 2011-10-28 DIAGNOSIS — H52209 Unspecified astigmatism, unspecified eye: Secondary | ICD-10-CM | POA: Diagnosis not present

## 2011-10-28 DIAGNOSIS — Z961 Presence of intraocular lens: Secondary | ICD-10-CM | POA: Diagnosis not present

## 2011-11-11 ENCOUNTER — Encounter: Payer: Self-pay | Admitting: Internal Medicine

## 2011-11-11 ENCOUNTER — Other Ambulatory Visit (INDEPENDENT_AMBULATORY_CARE_PROVIDER_SITE_OTHER): Payer: Medicare Other

## 2011-11-11 ENCOUNTER — Ambulatory Visit (INDEPENDENT_AMBULATORY_CARE_PROVIDER_SITE_OTHER): Payer: Medicare Other | Admitting: Internal Medicine

## 2011-11-11 VITALS — BP 112/82 | HR 113 | Temp 97.3°F | Ht 60.0 in | Wt 104.1 lb

## 2011-11-11 DIAGNOSIS — E785 Hyperlipidemia, unspecified: Secondary | ICD-10-CM

## 2011-11-11 DIAGNOSIS — I10 Essential (primary) hypertension: Secondary | ICD-10-CM

## 2011-11-11 DIAGNOSIS — R079 Chest pain, unspecified: Secondary | ICD-10-CM | POA: Diagnosis not present

## 2011-11-11 DIAGNOSIS — M810 Age-related osteoporosis without current pathological fracture: Secondary | ICD-10-CM

## 2011-11-11 LAB — URINALYSIS, ROUTINE W REFLEX MICROSCOPIC
Nitrite: NEGATIVE
Total Protein, Urine: NEGATIVE
Urobilinogen, UA: 0.2 (ref 0.0–1.0)

## 2011-11-11 LAB — LIPID PANEL
LDL Cholesterol: 97 mg/dL (ref 0–99)
VLDL: 14.6 mg/dL (ref 0.0–40.0)

## 2011-11-11 MED ORDER — HYDROCODONE-ACETAMINOPHEN 5-325 MG PO TABS: 1.0000 | ORAL_TABLET | Freq: Four times a day (QID) | ORAL | Status: AC | PRN

## 2011-11-11 MED ORDER — LOSARTAN POTASSIUM 50 MG PO TABS: 50.0000 mg | ORAL_TABLET | Freq: Every day | ORAL | Status: DC

## 2011-11-11 MED ORDER — ALPRAZOLAM 0.25 MG PO TABS: 0.2500 mg | ORAL_TABLET | Freq: Three times a day (TID) | ORAL | Status: DC | PRN

## 2011-11-11 MED ORDER — LOVASTATIN 40 MG PO TABS: 40.0000 mg | ORAL_TABLET | Freq: Every day | ORAL | Status: DC

## 2011-11-11 NOTE — Progress Notes (Signed)
Subjective:    Patient ID: Tammy Wolfe, female    DOB: Mar 14, 1941, 71 y.o.   MRN: [id number]  HPI  Here after 1 yr, has bilat mastectomy after left breast ca 2012 followed by Dr Dwain Sarna and Dr Donnie Coffin.  Has had recurrent MSK CP requiring hydrocodone in the past, asks for refill but lower strength.   Pt denies chest pain, increased sob or doe, wheezing, orthopnea, PND, increased LE swelling, palpitations, dizziness or syncope.  Pt denies new neurological symptoms such as new headache, or facial or extremity weakness or numbness   Pt denies polydipsia, polyuria, .  Pt states overall good compliance with meds, trying to follow lower cholesterol diet, wt overall stable but little exercise however.   Pt denies fever, wt loss, night sweats, loss of appetite, or other constitutional symptoms  Denies worsening depressive symptoms, suicidal ideation, or panic, though has ongoing anxiety, not increased recently.  Past Medical History  Diagnosis Date  . ABNORMAL THYROID FUNCTION TESTS 12/10/2009  . ALCOHOLIC HEPATITIS, HX OF 02/14/2007  . ALLERGIC RHINITIS 02/14/2007  . ANXIETY 03/19/2007  . Breast implant removal status 12/23/2008  . COLONIC POLYPS, HX OF 02/14/2007  . GOITER, MULTINODULAR 02/14/2007  . HX, PERSONAL, ALCOHOLISM 02/14/2007  . HYPERLIPIDEMIA 02/14/2007  . HYPERTENSION 02/14/2007  . OSTEOARTHRITIS 02/14/2007  . OSTEOPOROSIS 02/14/2007  . PANCREATITIS, HX OF 02/14/2007  . Cancer     stage I left breast cancer   Past Surgical History  Procedure Date  . Cataract extraction   . S/p jaw and periodontal surgury 2009  . Peridontal 2010  . Breast surgery 2010    silicone prepectoral implants explanted by Dr. Shon Hough after rupture  . Breast surgery     left simple mastectomy and left axillary sentinel node biopsy, right prophylactic mastectomy    reports that she has been smoking Cigarettes.  She has been smoking about 1 pack per day. She has never used smokeless tobacco. She reports that she  drinks alcohol. She reports that she does not use illicit drugs. family history is not on file.  She is adopted. Allergies  Allergen Reactions  . Tetracycline Anaphylaxis   Current Outpatient Prescriptions on File Prior to Visit  Medication Sig Dispense Refill  . Cholecalciferol (VITAMIN D) 1000 UNITS capsule Take 1,000 Units by mouth daily.        Marland Kitchen losartan (COZAAR) 50 MG tablet Take 1 tablet (50 mg total) by mouth daily.  90 tablet  3  . lovastatin (MEVACOR) 40 MG tablet Take 1 tablet (40 mg total) by mouth daily.  90 tablet  3  . tamoxifen (NOLVADEX) 20 MG tablet Take 20 mg by mouth daily.         Review of Systems Review of Systems  Constitutional: Negative for diaphoresis and unexpected weight change.  HENT: Negative for drooling and tinnitus.   Eyes: Negative for photophobia and visual disturbance.  Respiratory: Negative for choking and stridor.   Gastrointestinal: Negative for vomiting and blood in stool.  Genitourinary: Negative for hematuria and decreased urine volume.  Musculoskeletal: Negative for gait problem.  Skin: Negative for color change and wound.  Neurological: Negative for tremors and numbness.  Psychiatric/Behavioral: Negative for decreased concentration. The patient is not hyperactive.       Objective:   Physical Exam BP 112/82  Pulse 113  Temp 97.3 F (36.3 C) (Oral)  Ht 5' (1.524 m)  Wt 104 lb 2 oz (47.231 kg)  BMI 20.34 kg/m2  SpO2 97% Physical Exam  VS noted Constitutional: Pt appears well-developed and well-nourished.  HENT: Head: Normocephalic.  Right Ear: External ear normal.  Left Ear: External ear normal.  Eyes: Conjunctivae and EOM are normal. Pupils are equal, round, and reactive to light.  Neck: Normal range of motion. Neck supple.  Cardiovascular: Normal rate and regular rhythm.   Pulmonary/Chest: Effort normal and breath sounds normal.  Abd:  Soft, NT, non-distended, + BS Neurological: Pt is alert. No cranial nerve deficit.  motor/gait intact Skin: Skin is warm. No erythema.  Psychiatric: Pt behavior is normal. Thought content normal. 1+ nervous    Assessment & Plan:

## 2011-11-11 NOTE — Patient Instructions (Addendum)
Please call [phone] to leave a message for Tammy Wolfe if you decide to look into the copay cost for Prolia for osteoporosis Your refills were done as requested today, including the pain medication Continue all other medications as before Please have the pharmacy call with any other refills you may need. Please go to LAB in the Basement for the blood and/or urine tests to be done today You will be contacted by phone if any changes need to be made immediately.  Otherwise, you will receive a letter about your results with an explanation. Your bone density test was also faxed to Dr Donnie Coffin, though it is on the [facility] for review as well Please return in 1 year for your yearly visit, or sooner if needed, with Lab testing done 3-5 days before

## 2011-11-16 ENCOUNTER — Encounter: Payer: Self-pay | Admitting: Internal Medicine

## 2011-11-16 NOTE — Assessment & Plan Note (Signed)
D/w pt, to consider prolia,  to f/u any worsening symptoms or concerns

## 2011-11-16 NOTE — Assessment & Plan Note (Signed)
stable overall by hx and exam, most recent data reviewed with pt, and pt to continue medical treatment as before Lab Results  Component Value Date   LDLCALC 97 11/11/2011

## 2011-11-16 NOTE — Assessment & Plan Note (Signed)
stable overall by hx and exam, most recent data reviewed with pt, and pt to continue medical treatment as before BP Readings from Last 3 Encounters:  11/11/11 112/82  09/18/11 125/83  09/01/11 118/78

## 2011-11-16 NOTE — Assessment & Plan Note (Signed)
Chronic recurrent stable, for prn hydrocodone asd

## 2012-01-07 ENCOUNTER — Other Ambulatory Visit: Payer: Self-pay | Admitting: *Deleted

## 2012-01-07 DIAGNOSIS — C50919 Malignant neoplasm of unspecified site of unspecified female breast: Secondary | ICD-10-CM

## 2012-01-07 MED ORDER — TAMOXIFEN CITRATE 20 MG PO TABS: 20.0000 mg | ORAL_TABLET | Freq: Every day | ORAL | Status: DC

## 2012-03-19 ENCOUNTER — Telehealth: Payer: Self-pay | Admitting: *Deleted

## 2012-03-19 ENCOUNTER — Ambulatory Visit (HOSPITAL_BASED_OUTPATIENT_CLINIC_OR_DEPARTMENT_OTHER): Payer: Medicare Other | Admitting: Oncology

## 2012-03-19 ENCOUNTER — Other Ambulatory Visit (HOSPITAL_BASED_OUTPATIENT_CLINIC_OR_DEPARTMENT_OTHER): Payer: Medicare Other | Admitting: Lab

## 2012-03-19 VITALS — BP 145/84 | HR 106 | Temp 98.3°F | Resp 20 | Ht 60.0 in | Wt 98.9 lb

## 2012-03-19 DIAGNOSIS — R309 Painful micturition, unspecified: Secondary | ICD-10-CM

## 2012-03-19 DIAGNOSIS — C50919 Malignant neoplasm of unspecified site of unspecified female breast: Secondary | ICD-10-CM | POA: Diagnosis not present

## 2012-03-19 DIAGNOSIS — M810 Age-related osteoporosis without current pathological fracture: Secondary | ICD-10-CM | POA: Diagnosis not present

## 2012-03-19 DIAGNOSIS — E559 Vitamin D deficiency, unspecified: Secondary | ICD-10-CM | POA: Diagnosis not present

## 2012-03-19 DIAGNOSIS — R300 Dysuria: Secondary | ICD-10-CM

## 2012-03-19 DIAGNOSIS — D649 Anemia, unspecified: Secondary | ICD-10-CM

## 2012-03-19 LAB — URINALYSIS, MICROSCOPIC - CHCC
Glucose: NEGATIVE g/dL
Nitrite: NEGATIVE
Protein: NEGATIVE mg/dL

## 2012-03-19 LAB — CBC WITH DIFFERENTIAL/PLATELET
Eosinophils Absolute: 0.1 10*3/uL (ref 0.0–0.5)
MONO#: 0.6 10*3/uL (ref 0.1–0.9)
NEUT#: 4.7 10*3/uL (ref 1.5–6.5)
RBC: 3.66 10*6/uL — ABNORMAL LOW (ref 3.70–5.45)
RDW: 20.3 % — ABNORMAL HIGH (ref 11.2–14.5)
WBC: 6.8 10*3/uL (ref 3.9–10.3)
lymph#: 1.4 10*3/uL (ref 0.9–3.3)

## 2012-03-19 LAB — COMPREHENSIVE METABOLIC PANEL (CC13)
ALT: 18 U/L (ref 0–55)
AST: 28 U/L (ref 5–34)
CO2: 18 mEq/L — ABNORMAL LOW (ref 22–29)
Calcium: 8.7 mg/dL (ref 8.4–10.4)
Chloride: 111 mEq/L — ABNORMAL HIGH (ref 98–107)
Potassium: 4.1 mEq/L (ref 3.5–5.1)
Sodium: 138 mEq/L (ref 136–145)
Total Protein: 6.9 g/dL (ref 6.4–8.3)

## 2012-03-19 MED ORDER — CIPROFLOXACIN HCL 250 MG PO TABS: 250.0000 mg | ORAL_TABLET | Freq: Once | ORAL | Status: DC

## 2012-03-19 MED ORDER — TAMOXIFEN CITRATE 20 MG PO TABS: 20.0000 mg | ORAL_TABLET | Freq: Every day | ORAL | Status: DC

## 2012-03-19 NOTE — Progress Notes (Signed)
Hematology and Oncology Follow Up Visit  Tammy Wolfe [id number] 1940-12-13 71 y.o. 03/19/2012 12:06 PM   DIAGNOSIS: 71 year old woman with history of bilateral mastectomies performed 12/10/2010. Right side showed a 1.4 cm grade 1 ER/PR positive breast cancer, HER-2 negative. On tamoxifen since.  Encounter Diagnoses  Name Primary?  . Breast cancer Yes  . Painful urination      PAST THERAPY:  Bilateral mastectomies 12/10/2010  Interim History:  She is tolerating tamoxifen well. She hasn't had been having some urinate tract infection type symptoms. Her mother is ill and she is trying to deal with that as well. She still has some fatigue. Aside from that she is doing well. She really has no complaints. Medications: I have reviewed the patient's current medications.  Allergies:  Allergies  Allergen Reactions  . Tetracycline Anaphylaxis    Past Medical History, Surgical history, Social history, and Family History were reviewed and updated.  Review of Systems: Constitutional:  Negative for fever, chills, night sweats, anorexia, weight loss, pain. Cardiovascular: negative Respiratory: no cough, shortness of breath, or wheezing Neurological: negative Dermatological: negative ENT: negative Skin Gastrointestinal: negative Genito-Urinary: negative Hematological and Lymphatic: negative Breast: negative Musculoskeletal: negative Remaining ROS negative.  Physical Exam:  Blood pressure 145/84, pulse 106, temperature 98.3 F (36.8 C), resp. rate 20, height 5' (1.524 m), weight 98 lb 14.4 oz (44.861 kg).  ECOG: 0   General appearance: alert, cooperative and appears stated age Eyes: conjunctivae/corneas clear. PERRL, EOM's intact. Fundi benign. Throat: lips, mucosa, and tongue normal; teeth and gums normal Resp: clear to auscultation bilaterally and normal percussion bilaterally Breasts: status post mastectomies, surgical scars are healing well and is no evidence of local  recurrence. Both axilla negative. Cardio: regular rate and rhythm, S1, S2 normal, no murmur, click, rub or gallop and normal apical impulse GI: soft, non-tender; bowel sounds normal; no masses,  no organomegaly Extremities: extremities normal, atraumatic, no cyanosis or edema Pulses: 2+ and symmetric Lymph nodes: Cervical, supraclavicular, and axillary nodes normal. Neurologic: Grossly normal   Lab Results: Lab Results  Component Value Date   WBC 6.8 03/19/2012   HGB 10.9* 03/19/2012   HCT 33.1* 03/19/2012   MCV 90.6 03/19/2012   PLT 237 03/19/2012     Chemistry      Component Value Date/Time   NA 138 03/19/2012 1019   NA 143 09/18/2011 1107   K 4.1 03/19/2012 1019   K 3.4* 09/18/2011 1107   CL 111* 03/19/2012 1019   CL 114* 09/18/2011 1107   CO2 18* 03/19/2012 1019   CO2 17* 09/18/2011 1107   BUN 21.0 03/19/2012 1019   BUN 20 09/18/2011 1107   CREATININE 0.8 03/19/2012 1019   CREATININE 0.87 09/18/2011 1107      Component Value Date/Time   CALCIUM 8.7 03/19/2012 1019   CALCIUM 8.6 09/18/2011 1107   ALKPHOS 86 03/19/2012 1019   ALKPHOS 53 09/18/2011 1107   AST 28 03/19/2012 1019   AST 20 09/18/2011 1107   ALT 18 03/19/2012 1019   ALT 12 09/18/2011 1107   BILITOT 0.50 03/19/2012 1019   BILITOT 0.4 09/18/2011 1107       Radiological Studies:  No results found.   IMPRESSIONS AND PLAN: A 71 y.o. female with Node-negative ER/PR positive breast cancer and adjuvant tamoxifen. I am little concerned that her hemoglobin is been dropping down 2 g one year ago. Discusses with her. We'll get some labs added to her current set including iron studies folic acid. Her MCV  is not well. We'll recheck were taken. She has had a colonoscopy about 4 years ago. I will try to see her back in about 2 months to recheck her CBC.  Spent more than half the time coordinating care.    Tammy Wolfe 10/25/201312:06 PM

## 2012-03-19 NOTE — Telephone Encounter (Signed)
Gave patient appointment for 05-25-2012 starting at 9:00am

## 2012-03-20 LAB — VITAMIN D 25 HYDROXY (VIT D DEFICIENCY, FRACTURES): Vit D, 25-Hydroxy: 32 ng/mL (ref 30–89)

## 2012-03-22 ENCOUNTER — Other Ambulatory Visit: Payer: Self-pay | Admitting: Lab

## 2012-03-22 DIAGNOSIS — C50919 Malignant neoplasm of unspecified site of unspecified female breast: Secondary | ICD-10-CM

## 2012-03-22 DIAGNOSIS — R309 Painful micturition, unspecified: Secondary | ICD-10-CM

## 2012-03-22 DIAGNOSIS — R300 Dysuria: Secondary | ICD-10-CM | POA: Diagnosis not present

## 2012-03-23 ENCOUNTER — Telehealth: Payer: Self-pay | Admitting: Emergency Medicine

## 2012-03-23 NOTE — Telephone Encounter (Signed)
Instructed patient to take the Cipro 1 tablet (250mg ) twice a day for 5 days.  Patient states this is how she has been taking the Cipro and states she will complete the course on 10/30.  Patient denies any urinary symptoms and instructed to call this office for any changes or concerns.  Instructed patient to return on 12/31 for follow up appointment with Dr Donnie Coffin and labs.  Patient verbalized understanding.

## 2012-03-24 LAB — PROTEIN ELECTROPHORESIS, SERUM, WITH REFLEX
Albumin ELP: 56.5 % (ref 55.8–66.1)
Alpha-1-Globulin: 4.1 % (ref 2.9–4.9)
Beta 2: 4.6 % (ref 3.2–6.5)
Beta Globulin: 5.5 % (ref 4.7–7.2)
Total Protein, Serum Electrophoresis: 6.9 g/dL (ref 6.0–8.3)

## 2012-03-24 LAB — IRON AND TIBC: TIBC: 289 ug/dL (ref 250–470)

## 2012-04-26 ENCOUNTER — Ambulatory Visit (INDEPENDENT_AMBULATORY_CARE_PROVIDER_SITE_OTHER)
Admission: RE | Admit: 2012-04-26 | Discharge: 2012-04-26 | Disposition: A | Discharge status: Home / Self Care | Payer: Medicare Other | Source: Ambulatory Visit | Attending: Internal Medicine | Admitting: Internal Medicine

## 2012-04-26 ENCOUNTER — Encounter: Payer: Self-pay | Admitting: Internal Medicine

## 2012-04-26 ENCOUNTER — Ambulatory Visit (INDEPENDENT_AMBULATORY_CARE_PROVIDER_SITE_OTHER): Payer: Medicare Other | Admitting: Internal Medicine

## 2012-04-26 ENCOUNTER — Other Ambulatory Visit (INDEPENDENT_AMBULATORY_CARE_PROVIDER_SITE_OTHER): Payer: Medicare Other

## 2012-04-26 VITALS — BP 120/84 | HR 106 | Temp 97.5°F | Ht 60.0 in | Wt 99.2 lb

## 2012-04-26 DIAGNOSIS — M545 Low back pain, unspecified: Secondary | ICD-10-CM

## 2012-04-26 DIAGNOSIS — R7309 Other abnormal glucose: Secondary | ICD-10-CM | POA: Diagnosis not present

## 2012-04-26 DIAGNOSIS — R7302 Impaired glucose tolerance (oral): Secondary | ICD-10-CM

## 2012-04-26 DIAGNOSIS — N390 Urinary tract infection, site not specified: Secondary | ICD-10-CM | POA: Diagnosis not present

## 2012-04-26 DIAGNOSIS — G8929 Other chronic pain: Secondary | ICD-10-CM | POA: Insufficient documentation

## 2012-04-26 DIAGNOSIS — M549 Dorsalgia, unspecified: Secondary | ICD-10-CM | POA: Insufficient documentation

## 2012-04-26 DIAGNOSIS — E538 Deficiency of other specified B group vitamins: Secondary | ICD-10-CM | POA: Diagnosis not present

## 2012-04-26 DIAGNOSIS — D649 Anemia, unspecified: Secondary | ICD-10-CM

## 2012-04-26 DIAGNOSIS — M48061 Spinal stenosis, lumbar region without neurogenic claudication: Secondary | ICD-10-CM | POA: Diagnosis not present

## 2012-04-26 HISTORY — DX: Anemia, unspecified: D64.9

## 2012-04-26 LAB — URINALYSIS, ROUTINE W REFLEX MICROSCOPIC
Nitrite: NEGATIVE
Specific Gravity, Urine: 1.02 (ref 1.000–1.030)
Total Protein, Urine: NEGATIVE
Urine Glucose: NEGATIVE
pH: 6 (ref 5.0–8.0)

## 2012-04-26 LAB — CBC WITH DIFFERENTIAL/PLATELET
Eosinophils Absolute: 0.1 10*3/uL (ref 0.0–0.7)
Eosinophils Relative: 0.7 % (ref 0.0–5.0)
HCT: 31 % — ABNORMAL LOW (ref 36.0–46.0)
Lymphs Abs: 1.5 10*3/uL (ref 0.7–4.0)
MCHC: 32.3 g/dL (ref 30.0–36.0)
MCV: 91.8 fl (ref 78.0–100.0)
Monocytes Absolute: 0.5 10*3/uL (ref 0.1–1.0)
Platelets: 356 10*3/uL (ref 150.0–400.0)
WBC: 6.9 10*3/uL (ref 4.5–10.5)

## 2012-04-26 MED ORDER — HYDROCODONE-ACETAMINOPHEN 5-325 MG PO TABS: 1.0000 | ORAL_TABLET | Freq: Four times a day (QID) | ORAL | Status: DC | PRN

## 2012-04-26 MED ORDER — CYCLOBENZAPRINE HCL 5 MG PO TABS: 5.0000 mg | ORAL_TABLET | Freq: Three times a day (TID) | ORAL | Status: DC | PRN

## 2012-04-26 NOTE — Assessment & Plan Note (Signed)
For f/u labs today, o/w stable

## 2012-04-26 NOTE — Assessment & Plan Note (Signed)
With recent elev BS in the setting of UTI, doubt DM but will check a1c

## 2012-04-26 NOTE — Patient Instructions (Addendum)
Take all new medications as prescribed - the pain medication, and the muscle relaxer Please call (or send a message by MyChart)  if you would like to add the medrol (anti-inflammatory) OK to stop the cipro for now Please go to XRAY in the Basement for the x-ray test Please go to LAB in the Basement for the blood and urine test to be done today You will be contacted by phone if any changes need to be made immediately.  Otherwise, you will receive a letter about your results with an explanation, but please check with MyChart first. Thank you for enrolling in MyChart. Please follow the instructions below to securely access your online medical record. MyChart allows you to send messages to your doctor, view your test results, renew your prescriptions, schedule appointments, and more. To Log into MyChart, please go to [email or web address], and your Username is:  Therapist, art (and password)

## 2012-04-26 NOTE — Progress Notes (Signed)
Subjective:    Patient ID: Tammy Wolfe, female    DOB: [birth year], 71 y.o.   MRN: [id number]  HPI Pt here with 10 days onset LBP;  Was tx for UTI several wks ago per oncology, improved with one course of cipro, then with onset LBP 10 days ago she refilled the cipro (had 3 refills to take as needed) but pain without change, is mod to severe, dull/achy, worse to bend/twist/stand up, constant, no clear inciting event, and no bowel or bladder change, fever, wt loss,  worsening LE pain/numbness/weakness, gait change or falls.  Nothing seems to make better except trying to sit more still.  No overt bleeding or bruising with her hx of recent anemia.  Pt denies chest pain, increased sob or doe, wheezing, orthopnea, PND, increased LE swelling, palpitations, dizziness or syncope.   Pt denies polydipsia, polyuria.   Denies urinary symptoms such as dysuria, frequency, urgency,or hematuria.  Hs hx of osteoporosis per 2012 dxa - lowest t-score -2.9 at right hip.  No prior fx Past Medical History  Diagnosis Date  . ABNORMAL THYROID FUNCTION TESTS 12/10/2009  . ALCOHOLIC HEPATITIS, HX OF 02/14/2007  . ALLERGIC RHINITIS 02/14/2007  . ANXIETY 03/19/2007  . Breast implant removal status 12/23/2008  . COLONIC POLYPS, HX OF 02/14/2007  . GOITER, MULTINODULAR 02/14/2007  . HX, PERSONAL, ALCOHOLISM 02/14/2007  . HYPERLIPIDEMIA 02/14/2007  . HYPERTENSION 02/14/2007  . OSTEOARTHRITIS 02/14/2007  . OSTEOPOROSIS 02/14/2007  . PANCREATITIS, HX OF 02/14/2007  . Cancer     stage I left breast cancer   Past Surgical History  Procedure Date  . Cataract extraction   . S/p jaw and periodontal surgury 2009  . Peridontal 2010  . Breast surgery 2010    silicone prepectoral implants explanted by Dr. Shon Hough after rupture  . Breast surgery     left simple mastectomy and left axillary sentinel node biopsy, right prophylactic mastectomy    reports that she has been smoking Cigarettes.  She has been smoking about 1 pack per day. She  has never used smokeless tobacco. She reports that she drinks alcohol. She reports that she does not use illicit drugs. family history is not on file.  She is adopted. Allergies  Allergen Reactions  . Tetracycline Anaphylaxis   Current Outpatient Prescriptions on File Prior to Visit  Medication Sig Dispense Refill  . ALPRAZolam (XANAX) 0.25 MG tablet Take 1 tablet (0.25 mg total) by mouth 3 (three) times daily as needed for sleep.  90 tablet  5  . Cholecalciferol (VITAMIN D) 1000 UNITS capsule Take 1,000 Units by mouth daily.        Marland Kitchen losartan (COZAAR) 50 MG tablet Take 1 tablet (50 mg total) by mouth daily.  90 tablet  3  . lovastatin (MEVACOR) 40 MG tablet Take 1 tablet (40 mg total) by mouth daily.  90 tablet  3  . tamoxifen (NOLVADEX) 20 MG tablet Take 1 tablet (20 mg total) by mouth daily.  90 tablet  4   Review of Systems  Constitutional: Negative for diaphoresis and unexpected weight change.  HENT: Negative for tinnitus.   Eyes: Negative for photophobia and visual disturbance.  Respiratory: Negative for choking and stridor.   Gastrointestinal: Negative for vomiting and blood in stool.  Genitourinary: Negative for hematuria and decreased urine volume.  Musculoskeletal: Negative for acute joint swelling.  Skin: Negative for color change and wound.  Neurological: Negative for tremors and numbness.  Psychiatric/Behavioral: Negative for decreased concentration. The patient is not  hyperactive.       Objective:   Physical Exam BP 120/84  Pulse 106  Temp 97.5 F (36.4 C) (Oral)  Ht 5' (1.524 m)  Wt 99 lb 4 oz (45.02 kg)  BMI 19.38 kg/m2  SpO2 98% Physical Exam  VS noted Constitutional: Pt appears well-developed and well-nourished.  HENT: Head: Normocephalic.  Right Ear: External ear normal.  Left Ear: External ear normal.  Eyes: Conjunctivae and EOM are normal. Pupils are equal, round, and reactive to light.  Neck: Normal range of motion. Neck supple.  Cardiovascular:  Normal rate and regular rhythm.   Pulmonary/Chest: Effort normal and breath sounds normal.  Abd:  Soft, NT, non-distended, + BS Neurological: Pt is alert. Not confused , motor/sens/dtr intact to LE's Spine nontender; does have some mild tender bilat lumbar paravetebral without red/swelling/rash Skin: Skin is warm. No erythema. No rash Psychiatric: Pt behavior is normal. Thought content normal. 1+ nervous    Assessment & Plan:

## 2012-04-26 NOTE — Assessment & Plan Note (Signed)
No recent falls, but with osteoporosis cant r/o spont fx - for films today, but suspect underlying lumbar djd/ddd as cause for pain flare; exam benign for neuro changes - will hold on further imaging unless pain persists/worsens or neuro changes;  For pain control and trial flexeril prn, declines predpack or medrol trial for now

## 2012-04-26 NOTE — Assessment & Plan Note (Signed)
Recent s/p cipro x 2 courses;  Exam benign for this, ok for UA but not likely to need further antibx

## 2012-04-27 LAB — VITAMIN B12: Vitamin B-12: 289 pg/mL (ref 211–911)

## 2012-05-24 ENCOUNTER — Other Ambulatory Visit: Payer: Self-pay | Admitting: *Deleted

## 2012-05-24 DIAGNOSIS — C50919 Malignant neoplasm of unspecified site of unspecified female breast: Secondary | ICD-10-CM

## 2012-05-25 ENCOUNTER — Ambulatory Visit (HOSPITAL_BASED_OUTPATIENT_CLINIC_OR_DEPARTMENT_OTHER): Payer: Medicare Other | Admitting: Oncology

## 2012-05-25 ENCOUNTER — Other Ambulatory Visit (HOSPITAL_BASED_OUTPATIENT_CLINIC_OR_DEPARTMENT_OTHER): Payer: Medicare Other | Admitting: Lab

## 2012-05-25 ENCOUNTER — Telehealth: Payer: Self-pay | Admitting: *Deleted

## 2012-05-25 VITALS — BP 146/83 | HR 93 | Temp 98.3°F | Resp 20 | Ht 60.0 in | Wt 97.8 lb

## 2012-05-25 DIAGNOSIS — D649 Anemia, unspecified: Secondary | ICD-10-CM

## 2012-05-25 DIAGNOSIS — Z170 Estrogen receptor positive status [ER+]: Secondary | ICD-10-CM | POA: Diagnosis not present

## 2012-05-25 DIAGNOSIS — C50219 Malignant neoplasm of upper-inner quadrant of unspecified female breast: Secondary | ICD-10-CM

## 2012-05-25 DIAGNOSIS — C50919 Malignant neoplasm of unspecified site of unspecified female breast: Secondary | ICD-10-CM

## 2012-05-25 DIAGNOSIS — C50419 Malignant neoplasm of upper-outer quadrant of unspecified female breast: Secondary | ICD-10-CM | POA: Diagnosis not present

## 2012-05-25 LAB — CBC WITH DIFFERENTIAL/PLATELET
Basophils Absolute: 0 10*3/uL (ref 0.0–0.1)
EOS%: 2 % (ref 0.0–7.0)
HCT: 34.3 % — ABNORMAL LOW (ref 34.8–46.6)
HGB: 10.9 g/dL — ABNORMAL LOW (ref 11.6–15.9)
MCH: 28.3 pg (ref 25.1–34.0)
MCHC: 31.8 g/dL (ref 31.5–36.0)
MCV: 89.1 fL (ref 79.5–101.0)
MONO%: 6.3 % (ref 0.0–14.0)
NEUT%: 68.2 % (ref 38.4–76.8)
RDW: 14.9 % — ABNORMAL HIGH (ref 11.2–14.5)

## 2012-05-25 MED ORDER — CYANOCOBALAMIN 1000 MCG PO TABS
500.0000 ug | ORAL_TABLET | Freq: Every day | ORAL | Status: DC
Start: 2012-05-25 — End: 2017-04-08

## 2012-05-25 MED ORDER — SILVER SULFADIAZINE 1 % EX CREA: TOPICAL_CREAM | Freq: Every day | CUTANEOUS | Status: DC

## 2012-05-25 NOTE — Telephone Encounter (Signed)
Gave patient instructions for getting 2014
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
## 2012-06-10 ENCOUNTER — Other Ambulatory Visit: Payer: Self-pay

## 2012-06-10 MED ORDER — ALPRAZOLAM 0.25 MG PO TABS: 0.2500 mg | ORAL_TABLET | Freq: Three times a day (TID) | ORAL | Status: DC | PRN

## 2012-06-10 NOTE — Telephone Encounter (Signed)
Done hardcopy to robin  

## 2012-06-11 NOTE — Telephone Encounter (Signed)
Rx faxed to [facility].

## 2012-07-11 ENCOUNTER — Other Ambulatory Visit: Payer: Self-pay

## 2012-08-16 ENCOUNTER — Telehealth: Payer: Self-pay | Admitting: *Deleted

## 2012-08-16 NOTE — Telephone Encounter (Signed)
Pt called and left me a message to call her to get rescheduled and I called and left her a message to return my call.

## 2012-08-17 ENCOUNTER — Telehealth: Payer: Self-pay | Admitting: *Deleted

## 2012-08-17 NOTE — Telephone Encounter (Signed)
Pt called and left me a voicemail.  I called and left her a message to call me back.

## 2012-08-18 ENCOUNTER — Encounter: Payer: Self-pay | Admitting: Oncology

## 2012-08-18 ENCOUNTER — Telehealth: Payer: Self-pay | Admitting: *Deleted

## 2012-08-18 NOTE — Telephone Encounter (Signed)
Pt returned my call and I confirmed 08/24/12 appt w/ pt.  Mailed letter & calendar to pt.

## 2012-08-24 ENCOUNTER — Telehealth: Payer: Self-pay | Admitting: *Deleted

## 2012-08-24 ENCOUNTER — Ambulatory Visit (HOSPITAL_BASED_OUTPATIENT_CLINIC_OR_DEPARTMENT_OTHER): Payer: Medicare Other | Admitting: Family

## 2012-08-24 ENCOUNTER — Encounter: Payer: Self-pay | Admitting: Family

## 2012-08-24 VITALS — BP 131/79 | HR 121 | Temp 98.2°F | Resp 20 | Ht 60.0 in | Wt 92.0 lb

## 2012-08-24 DIAGNOSIS — G4700 Insomnia, unspecified: Secondary | ICD-10-CM | POA: Diagnosis not present

## 2012-08-24 DIAGNOSIS — C50912 Malignant neoplasm of unspecified site of left female breast: Secondary | ICD-10-CM

## 2012-08-24 DIAGNOSIS — F411 Generalized anxiety disorder: Secondary | ICD-10-CM | POA: Diagnosis not present

## 2012-08-24 DIAGNOSIS — C50919 Malignant neoplasm of unspecified site of unspecified female breast: Secondary | ICD-10-CM

## 2012-08-24 DIAGNOSIS — M810 Age-related osteoporosis without current pathological fracture: Secondary | ICD-10-CM | POA: Diagnosis not present

## 2012-08-24 DIAGNOSIS — C50419 Malignant neoplasm of upper-outer quadrant of unspecified female breast: Secondary | ICD-10-CM | POA: Diagnosis not present

## 2012-08-24 DIAGNOSIS — E538 Deficiency of other specified B group vitamins: Secondary | ICD-10-CM

## 2012-08-24 MED ORDER — TAMOXIFEN CITRATE 20 MG PO TABS: 20.0000 mg | ORAL_TABLET | Freq: Every day | ORAL | Status: DC

## 2012-08-24 NOTE — Telephone Encounter (Signed)
appts made and printed 

## 2012-08-24 NOTE — Patient Instructions (Addendum)
Please contact us at [phone] if you have any questions or concerns. 

## 2012-08-24 NOTE — Progress Notes (Signed)
[facility]  [phone] [phone]  OFFICE PROGRESS NOTE   ID: Tammy Wolfe   DOB: 06-Sep-1940  MR#: [id number]  ZHY#:[id number]   PCP: Tammy Barre, MD SURGEON:  Tammy Loron, MD  HISTORY OF PRESENT ILLNESS: From Tammy Wolfe is New Patient Evaluation Note dated 12/20/2010: "This is a very pleasant 72 year old woman who is here with her daughter, Tammy Wolfe, to discuss her recently diagnosed breast cancer. This woman has been in good health.  She is [place] [employer] native.  She has undergone annual screening mammography.  She had a screening mammogram on 11/12/2010 which showed the presence of bilateral silicone implants as well as numerous dense hyperdense material.  BSGI was recommended because of this.  A BSGI was performed which showed a 1.4 cm focus of moderate intense activity upper outer quadrant at the 10 o'clock position on the left.  There was a 1.6 cm focus of isotope activity at the 3 o'clock position.  Bilateral ultrasounds were used to evaluate these areas.  An ultrasound did suggest the presence of a mass at the 11 o'clock position in the right breast.  Both these areas were biopsied on 11/20/2010 and pathology did show an unremarkable biopsy on the right; on the left there was an invasive ductal cancer grade 2-3 with LVI noted.  This was felt to be a grade 2-3 lesion, ER positive 100%, PR positive 98%, proliferative index 23%, HER-2 was not amplified with a ratio of 1.48.  The patient did undergo an MRI scan of both breasts.  Initially had a 1.2 x 1.2 x 0.7 cm mass left breast consistent with invasive ductal cancer; on the right side there was a 1.1 x 0.8 x 0.7 cm mass.  The patient elected to undergo bilateral mastectomies which she did on 12/10/2010.  On the left side there was invasive ductal cancer measuring 1.4 cm and was grade 1 of 3 with two negative sentinel lymph nodes.  On the right side there was a benign lymph node and no evidence of  malignancy.  Surgical biopsies were all clear.  Once again the prognostic panel suggested a low proliferative index and non-amplified ratio of 1.48.  The patient has had an unremarkable postoperative course."  Her subsequent history is as detailed below.  INTERVAL HISTORY: Tammy Wolfe and I saw Tammy Wolfe today for follow up of invasive ductal carcinoma of the left breast.  She is accompanied by her friend Tammy Wolfe for today's office visit.  The patient was last seen by Tammy Wolfe on 05/25/2012.  Since her last office visit the patient has been doing relatively well.  She is establishing herself with Tammy Wolfe service today.   REVIEW OF SYSTEMS: A 10 point review of systems was completed and is negative except for complaints of insomnia and anxiety.  The patient denies any other symptomatology.  The patient smokes cigarettes daily and was given smoking cessation counseling.  The patient is not receptive to smoking cessation at this time.  The patient's friend Tammy Wolfe that accompanied her to this office visit was concerned about the patient's anxiety.  The patient and Tammy Wolfe spoke to [employer], Tammy Wolfe about her feelings of anxiety.    PAST MEDICAL HISTORY: Past Medical History  Diagnosis Date  . ABNORMAL THYROID FUNCTION TESTS 12/10/2009  . ALCOHOLIC HEPATITIS, HX OF 02/14/2007  . ALLERGIC RHINITIS 02/14/2007  . ANXIETY 03/19/2007  . Breast implant removal status 12/23/2008  . COLONIC POLYPS, HX OF 02/14/2007  . GOITER,  MULTINODULAR 02/14/2007  . HX, PERSONAL, ALCOHOLISM 02/14/2007  . HYPERLIPIDEMIA 02/14/2007  . HYPERTENSION 02/14/2007  . OSTEOARTHRITIS 02/14/2007  . OSTEOPOROSIS 02/14/2007  . PANCREATITIS, HX OF 02/14/2007  . Anemia, unspecified 04/26/2012  . Breast cancer   . Insomnia     PAST SURGICAL HISTORY: Past Surgical History  Procedure Laterality Date  . Cataract extraction    . S/p jaw and periodontal surgury  2009  . Peridontal  2010  . Breast surgery  2010    silicone  prepectoral implants explanted by Dr. Shon Hough after rupture  . Breast surgery      left simple mastectomy and left axillary sentinel node biopsy, right prophylactic mastectomy    FAMILY HISTORY Family History  Problem Relation Age of Onset  . Adopted: Yes  . Family history unknown: Yes    GYNECOLOGIC HISTORY: She is G3, P3 and she was on hormone replacement therapy for 15 years but discontinued in 2007.  Menarche at age 76.  SOCIAL HISTORY: She has been divorced for 30 years and has been married twice.  Currently lives alone.  [place] [employer] [place] 70[place]  She has 3 children, two in [place] and one in [place].  She has 2 grandchildren.   ADVANCED DIRECTIVES: Not on file  HEALTH MAINTENANCE: History  Substance Use Topics  . Smoking status: Current Every Day Smoker -- 0.50 packs/day    Types: Cigarettes  . Smokeless tobacco: Never Used  . Alcohol Use: 0.0 oz/week     Comment: one glass/wine occasional    Colonoscopy: The patient's last colonoscopy we have on record was in 2006 PAP:  04/25/2008 Bone density:  Last bone density scan was on 03/04/2011 which showed osteoporosis with T score of -2.9 Lipid panel: 11/11/2011  Allergies  Allergen Reactions  . Tetracycline Anaphylaxis    Current Outpatient Prescriptions  Medication Sig Dispense Refill  . ALPRAZolam (XANAX) 0.25 MG tablet Take 1 tablet (0.25 mg total) by mouth 3 (three) times daily as needed for sleep.  90 tablet  5  . Cholecalciferol (VITAMIN D) 1000 UNITS capsule Take 1,000 Units by mouth daily.        . cyanocobalamin ([employer] VITAMIN B12) 1000 MCG tablet Take 0.5 tablets (500 mcg total) by mouth daily.  30 tablet  4  . cyclobenzaprine (FLEXERIL) 5 MG tablet Take 1 tablet (5 mg total) by mouth 3 (three) times daily as needed for muscle spasms.  60 tablet  1  . HYDROcodone-acetaminophen (NORCO/VICODIN) 5-325 MG per tablet Take 1 tablet by mouth every 6 (six) hours as  needed for pain.  100 tablet  0  . losartan (COZAAR) 50 MG tablet Take 1 tablet (50 mg total) by mouth daily.  90 tablet  3  . lovastatin (MEVACOR) 40 MG tablet Take 1 tablet (40 mg total) by mouth daily.  90 tablet  3  . silver sulfADIAZINE (SILVADENE) 1 % cream Apply topically daily.  50 g  0  . tamoxifen (NOLVADEX) 20 MG tablet Take 1 tablet (20 mg total) by mouth daily.  90 tablet  8   No current facility-administered medications for this visit.    OBJECTIVE: Filed Vitals:   08/24/12 1410  BP: 131/79  Pulse: 121  Temp: 98.2 F (36.8 C)  Resp: 20     Body mass index is 17.97 kg/(m^2).      ECOG FS:  Grade 1 - Symptomatic, but fully ambulatory  General appearance: Alert, cooperative, thin frame, mild distress Head: Normocephalic, without obvious abnormality, atraumatic Eyes: Arcus senilis, PERRLA, EOMI Nose: Nares, septum and mucosa are normal, no drainage or sinus tenderness Neck: No adenopathy, supple, symmetrical, trachea midline, thyroid not enlarged, no tenderness Resp: Clear to auscultation bilaterally Cardio: Regular rate and rhythm, S1, S2 normal, no murmur, click, rub or gallop Breasts: Bilateral mastectomy, bilateral well-healed surgical scars, no lymphadenopathy, no axilla fullness GI: Soft, not distended, non-tender, hypoactive bowel sounds, no organomegaly Extremities: Extremities normal, atraumatic, no cyanosis or edema Lymph nodes: Cervical, supraclavicular, and axillary nodes normal Neurologic: Grossly normal   LAB RESULTS: Lab Results  Component Value Date   WBC 8.0 05/25/2012   NEUTROABS 5.4 05/25/2012   HGB 10.9* 05/25/2012   HCT 34.3* 05/25/2012   MCV 89.1 05/25/2012   PLT 315 05/25/2012      Chemistry      Component Value Date/Time   NA 138 03/19/2012 1019   NA 143 09/18/2011 1107   K 4.1 03/19/2012 1019   K 3.4* 09/18/2011 1107   CL 111* 03/19/2012 1019   CL 114* 09/18/2011 1107   CO2 18* 03/19/2012 1019   CO2 17* 09/18/2011 1107    BUN 21.0 03/19/2012 1019   BUN 20 09/18/2011 1107   CREATININE 0.8 03/19/2012 1019   CREATININE 0.87 09/18/2011 1107      Component Value Date/Time   CALCIUM 8.7 03/19/2012 1019   CALCIUM 8.6 09/18/2011 1107   ALKPHOS 86 03/19/2012 1019   ALKPHOS 53 09/18/2011 1107   AST 28 03/19/2012 1019   AST 20 09/18/2011 1107   ALT 18 03/19/2012 1019   ALT 12 09/18/2011 1107   BILITOT 0.50 03/19/2012 1019   BILITOT 0.4 09/18/2011 1107      Lab Results  Component Value Date   LABCA2 24 12/20/2010    Urinalysis    Component Value Date/Time   COLORURINE LT. YELLOW 04/26/2012 1654   APPEARANCEUR CLEAR 04/26/2012 1654   LABSPEC 1.020 04/26/2012 1654   LABSPEC 1.010 03/19/2012 1044   PHURINE 6.0 04/26/2012 1654   HGBUR TRACE-LYSED 04/26/2012 1654   BILIRUBINUR NEGATIVE 04/26/2012 1654   KETONESUR NEGATIVE 04/26/2012 1654   UROBILINOGEN 0.2 04/26/2012 1654   NITRITE NEGATIVE 04/26/2012 1654   LEUKOCYTESUR NEGATIVE 04/26/2012 1654    STUDIES: No results found.  ASSESSMENT: 72 y.o. [place]: 1.  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  2.  Status post bilateral breast MRI on 11/18/2010 which noted a history of bilateral silicone breast implants removed in 2010 due to implant rupture.  The MRI also showed a left breast 1.2 cm mass at the 2:30 position, and a 1.1 cm intramammary lymph node on the right breast.    3.  Status post right breast needle core biopsy on 11/20/2010 which showed no evidence of malignancy.  4.  Status post bilateral mastectomy with left axillary sentinel node biopsy on 12/10/2010 for a left breast invasive ductal carcinoma with calcifications, stage I, T1c N0, 1.4 cm, grade 1, 0/2 positive lymph nodes, right breast axillary sentinel node biopsy with 0/1 positive  lymph nodes.  5. The patient started antiestrogen therapy with Tamoxifen in 11/2010.  The patient did not receive chemotherapy or radiation therapy.  6.  Insomnia and anxiety  7.  Osteoporosis  8.  Tobacco abuse  PLAN: 1.  The patient will continue antiestrogen therapy with Tamoxifen 20 mg by mouth daily.  The patient was provided with a written prescription for Tamoxifen #90 with 8 refills.  2.  For the patient's insomnia and anxiety, the patient is already taking Xanax 0.25 mg 3 times a day, when necessary.  The patient was provided with information about available community resources regarding her anxiety by Tammy Wolfe, Kathrin Penner.  3.  The patient is taking vitamin D 1000 IUs daily for osteoporosis.  She is due for another bone density examination at this time and states she will discuss this with her primary care physician.  The patient was also encouraged to exercise daily (i.e.walking, yoga) which will also help to strengthen her bones and possibly help alleviate some of her anxiety.  4.  Smoking cessation was discussed with the patient, however she is not receptive to smoking cessation at this time.  5.  We plan to see the patient again in 6 months (02/2013) at which time we will check a CBC, CMP, LDH, vitamin B 12 level, and vitamin D level.  All questions were answered.  The patient was encouraged to contact us in the interim with any problems, questions or concerns.   Larina Bras, NP-C 08/29/2012, 11:34 AM

## 2012-09-27 ENCOUNTER — Encounter (INDEPENDENT_AMBULATORY_CARE_PROVIDER_SITE_OTHER): Payer: Self-pay | Admitting: General Surgery

## 2012-09-27 ENCOUNTER — Ambulatory Visit (INDEPENDENT_AMBULATORY_CARE_PROVIDER_SITE_OTHER): Payer: Medicare Other | Admitting: General Surgery

## 2012-09-27 VITALS — BP 102/64 | HR 54 | Temp 98.1°F | Resp 18 | Ht 60.0 in | Wt 96.2 lb

## 2012-09-27 DIAGNOSIS — C50912 Malignant neoplasm of unspecified site of left female breast: Secondary | ICD-10-CM

## 2012-09-27 DIAGNOSIS — C50919 Malignant neoplasm of unspecified site of unspecified female breast: Secondary | ICD-10-CM
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
## 2012-10-06 ENCOUNTER — Ambulatory Visit (INDEPENDENT_AMBULATORY_CARE_PROVIDER_SITE_OTHER): Payer: Medicare Other | Admitting: Internal Medicine

## 2012-10-06 ENCOUNTER — Encounter: Payer: Self-pay | Admitting: Internal Medicine

## 2012-10-06 VITALS — BP 130/80 | HR 112 | Temp 97.0°F | Ht 60.0 in | Wt 98.0 lb

## 2012-10-06 DIAGNOSIS — I10 Essential (primary) hypertension: Secondary | ICD-10-CM | POA: Diagnosis not present

## 2012-10-06 DIAGNOSIS — F411 Generalized anxiety disorder: Secondary | ICD-10-CM

## 2012-10-06 DIAGNOSIS — R300 Dysuria: Secondary | ICD-10-CM

## 2012-10-06 MED ORDER — CEPHALEXIN 500 MG PO CAPS: 500.0000 mg | ORAL_CAPSULE | Freq: Four times a day (QID) | ORAL | Status: DC

## 2012-10-06 NOTE — Assessment & Plan Note (Signed)
With situational worsening today, declines change in tx or referral

## 2012-10-06 NOTE — Progress Notes (Signed)
Subjective:    Patient ID: Tammy Wolfe, female    DOB: 12/08/1940, 72 y.o.   MRN: [id number]  HPI  Here with one day onset severe dysuria, very upsetting, very nervous, near tears,  Has long hx of anxiety.  Denies urinary symptoms such as  frequency, urgency, flank pain, hematuria or n/v, fever, chills, but feels overall c/w prior uti.  Tx earlier this yr with cipro course and did well.  Denies other significant incontinence.  Very nervous today, and after several tries, simply cannot give a urine specimen.  No LBP, was urinating at home prior this AM.  Has also been taking vit b12 oral with plan for f/u labs in July, now with new hematologist, Dr Darnelle Catalan.   Pt denies fever, wt loss, night sweats, loss of appetite, or other constitutional symptom. No other new complaints Past Medical History  Diagnosis Date  . ABNORMAL THYROID FUNCTION TESTS 12/10/2009  . ALCOHOLIC HEPATITIS, HX OF 02/14/2007  . ALLERGIC RHINITIS 02/14/2007  . ANXIETY 03/19/2007  . Breast implant removal status 12/23/2008  . COLONIC POLYPS, HX OF 02/14/2007  . GOITER, MULTINODULAR 02/14/2007  . HX, PERSONAL, ALCOHOLISM 02/14/2007  . HYPERLIPIDEMIA 02/14/2007  . HYPERTENSION 02/14/2007  . OSTEOARTHRITIS 02/14/2007  . OSTEOPOROSIS 02/14/2007  . PANCREATITIS, HX OF 02/14/2007  . Anemia, unspecified 04/26/2012  . Breast cancer   . Insomnia    Past Surgical History  Procedure Laterality Date  . Cataract extraction    . S/p jaw and periodontal surgury  2009  . Peridontal  2010  . Breast surgery  2010    silicone prepectoral implants explanted by Dr. Shon Hough after rupture  . Breast surgery      left simple mastectomy and left axillary sentinel node biopsy, right prophylactic mastectomy    reports that she has been smoking Cigarettes.  She has been smoking about 0.50 packs per day. She has never used smokeless tobacco. She reports that  drinks alcohol. She reports that she does not use illicit drugs. family history is not on file. She  is adopted. Allergies  Allergen Reactions  . Tetracycline Anaphylaxis   Current Outpatient Prescriptions on File Prior to Visit  Medication Sig Dispense Refill  . ALPRAZolam (XANAX) 0.25 MG tablet Take 1 tablet (0.25 mg total) by mouth 3 (three) times daily as needed for sleep.  90 tablet  5  . Cholecalciferol (VITAMIN D) 1000 UNITS capsule Take 1,000 Units by mouth daily.        . cyanocobalamin ([facility]S VITAMIN B12) 1000 MCG tablet Take 0.5 tablets (500 mcg total) by mouth daily.  30 tablet  4  . cyclobenzaprine (FLEXERIL) 5 MG tablet Take 1 tablet (5 mg total) by mouth 3 (three) times daily as needed for muscle spasms.  60 tablet  1  . losartan (COZAAR) 50 MG tablet Take 1 tablet (50 mg total) by mouth daily.  90 tablet  3  . lovastatin (MEVACOR) 40 MG tablet Take 1 tablet (40 mg total) by mouth daily.  90 tablet  3  . tamoxifen (NOLVADEX) 20 MG tablet Take 1 tablet (20 mg total) by mouth daily.  90 tablet  8   No current facility-administered medications on file prior to visit.    Review of Systems  Constitutional: Negative for unexpected weight change, or unusual diaphoresis  HENT: Negative for tinnitus.   Eyes: Negative for photophobia and visual disturbance.  Respiratory: Negative for choking and stridor.   Gastrointestinal: Negative for vomiting and blood in stool.  Genitourinary:  Negative for hematuria and decreased urine volume.  Musculoskeletal: Negative for acute joint swelling Skin: Negative for color change and wound.  Neurological: Negative for tremors and numbness other than noted  Psychiatric/Behavioral: Negative for decreased concentration or  hyperactivity.       Objective:   Physical Exam BP 130/80  Pulse 112  Temp(Src) 97 F (36.1 C) (Oral)  Ht 5' (1.524 m)  Wt 98 lb (44.453 kg)  BMI 19.14 kg/m2  SpO2 91% VS noted,  Constitutional: Pt appears well-developed and well-nourished.  HENT: Head: NCAT.  Right Ear: External ear normal.  Left Ear: External ear  normal.  Eyes: Conjunctivae and EOM are normal. Pupils are equal, round, and reactive to light.  Neck: Normal range of motion. Neck supple.  Cardiovascular: Normal rate and regular rhythm.   Pulmonary/Chest: Effort normal and breath sounds normal.  Abd:  Soft, non-distended, + BS, with low mid abd tender, and ? Palpable bladder Neurological: Pt is alert. Not confused , motor intact Skin: Skin is warm. No erythema.  Psychiatric: Pt behavior is normal. Thought content normal. 2+ enrvous    Assessment & Plan:

## 2012-10-06 NOTE — Assessment & Plan Note (Signed)
With some ? Urinary retention today, but may be simple shy bladder, unable to give specimen in the office but can urinate at home;  No LBP or other red flags, Ok for empiric tx, f/u any worsening s/s.

## 2012-10-06 NOTE — Assessment & Plan Note (Signed)
stable overall by history and exam, recent data reviewed with pt, and pt to continue medical treatment as before,  to f/u any worsening symptoms or concerns / BP Readings from Last 3 Encounters:  10/06/12 130/80  09/27/12 102/64  08/24/12 131/79

## 2012-10-06 NOTE — Patient Instructions (Addendum)
Please take all new medication as prescribed - the antibiotic Please continue all other medications as before, and refills have been done if requested. You specimen will be sent for studies Please keep your appointments with your specialists as you may have planned - Dr Dorien Chihuahua Thank you for enrolling in MyChart. Please follow the instructions below to securely access your online medical record. MyChart allows you to send messages to your doctor, view your test results, renew your prescriptions, schedule appointments, and more. Please return in 3 months, or sooner if needed

## 2012-11-02 DIAGNOSIS — H524 Presbyopia: Secondary | ICD-10-CM | POA: Diagnosis not present

## 2012-11-02 DIAGNOSIS — Z961 Presence of intraocular lens: Secondary | ICD-10-CM | POA: Diagnosis not present

## 2012-11-02 DIAGNOSIS — H52209 Unspecified astigmatism, unspecified eye: Secondary | ICD-10-CM | POA: Diagnosis not present

## 2013-01-03 ENCOUNTER — Other Ambulatory Visit: Payer: Self-pay | Admitting: Internal Medicine

## 2013-01-03 NOTE — Telephone Encounter (Signed)
Done hardcopy to robin  

## 2013-01-03 NOTE — Telephone Encounter (Signed)
Faxed hard copy to [facility].  

## 2013-01-23 IMAGING — CR DG LUMBAR SPINE 2-3V
3 series · 3 of 3 positions shown · non-contrast
Comparison: None.

CLINICAL DATA: Low back pain for 10 days.  No injury.

LUMBAR SPINE - 2-3 VIEW

[view not recorded (1 of 3)]
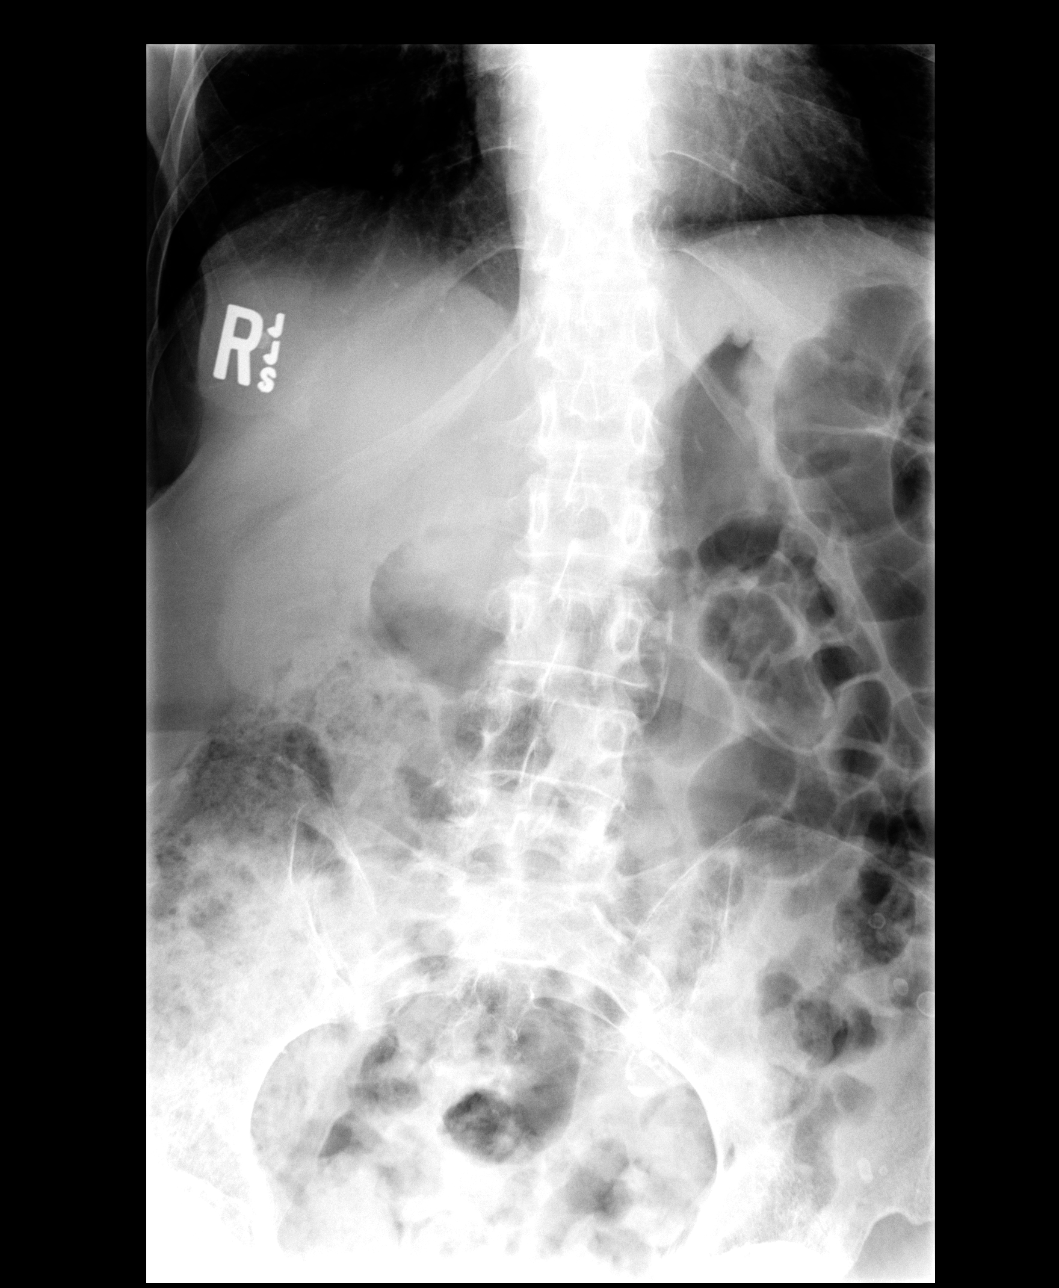

[view not recorded (2 of 3)]
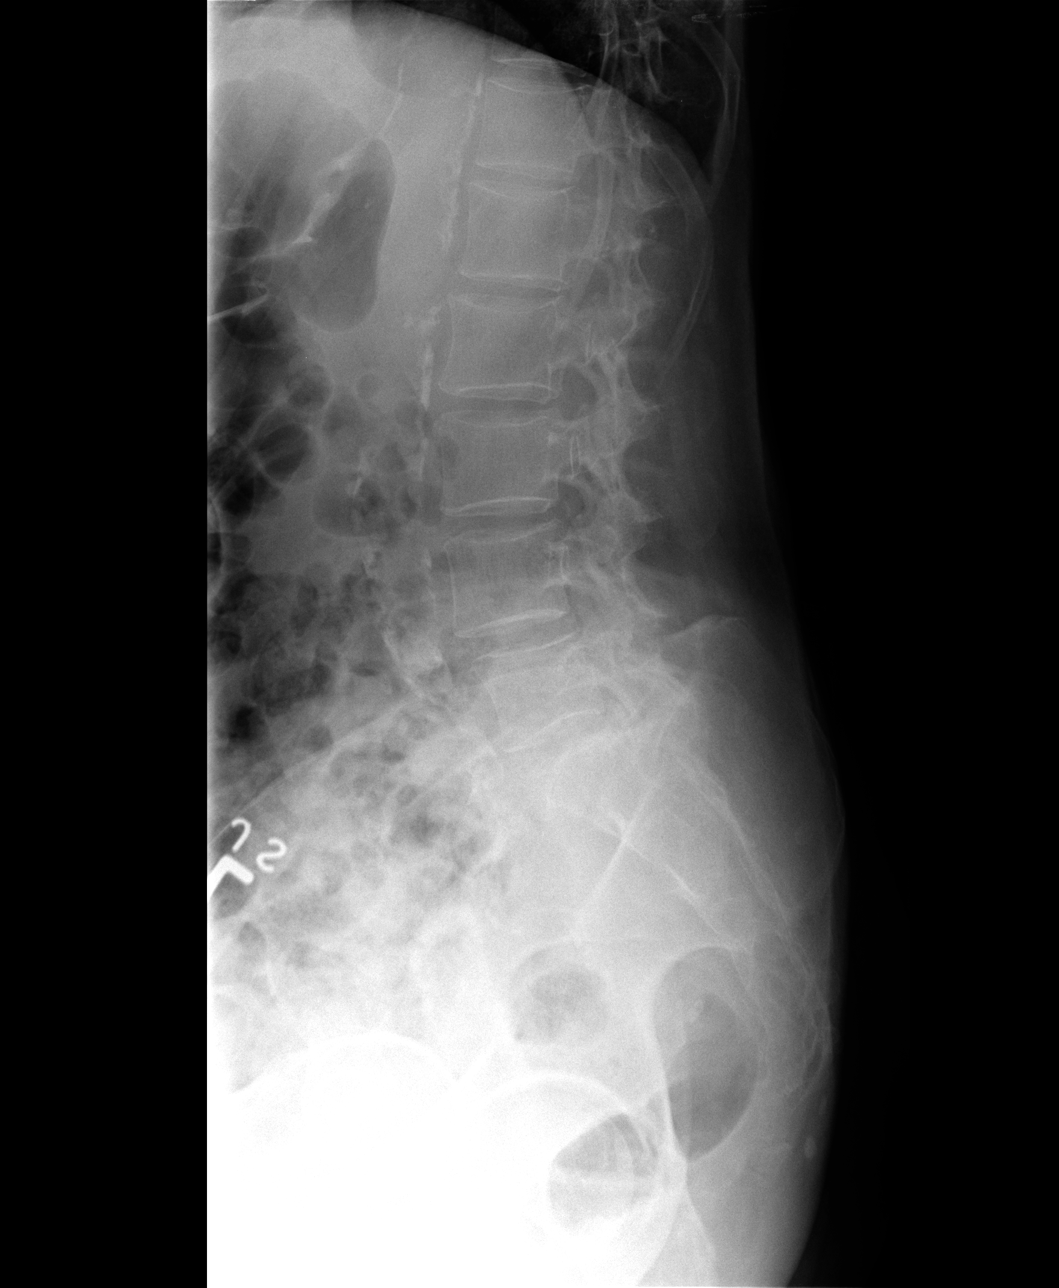

[view not recorded (3 of 3)]
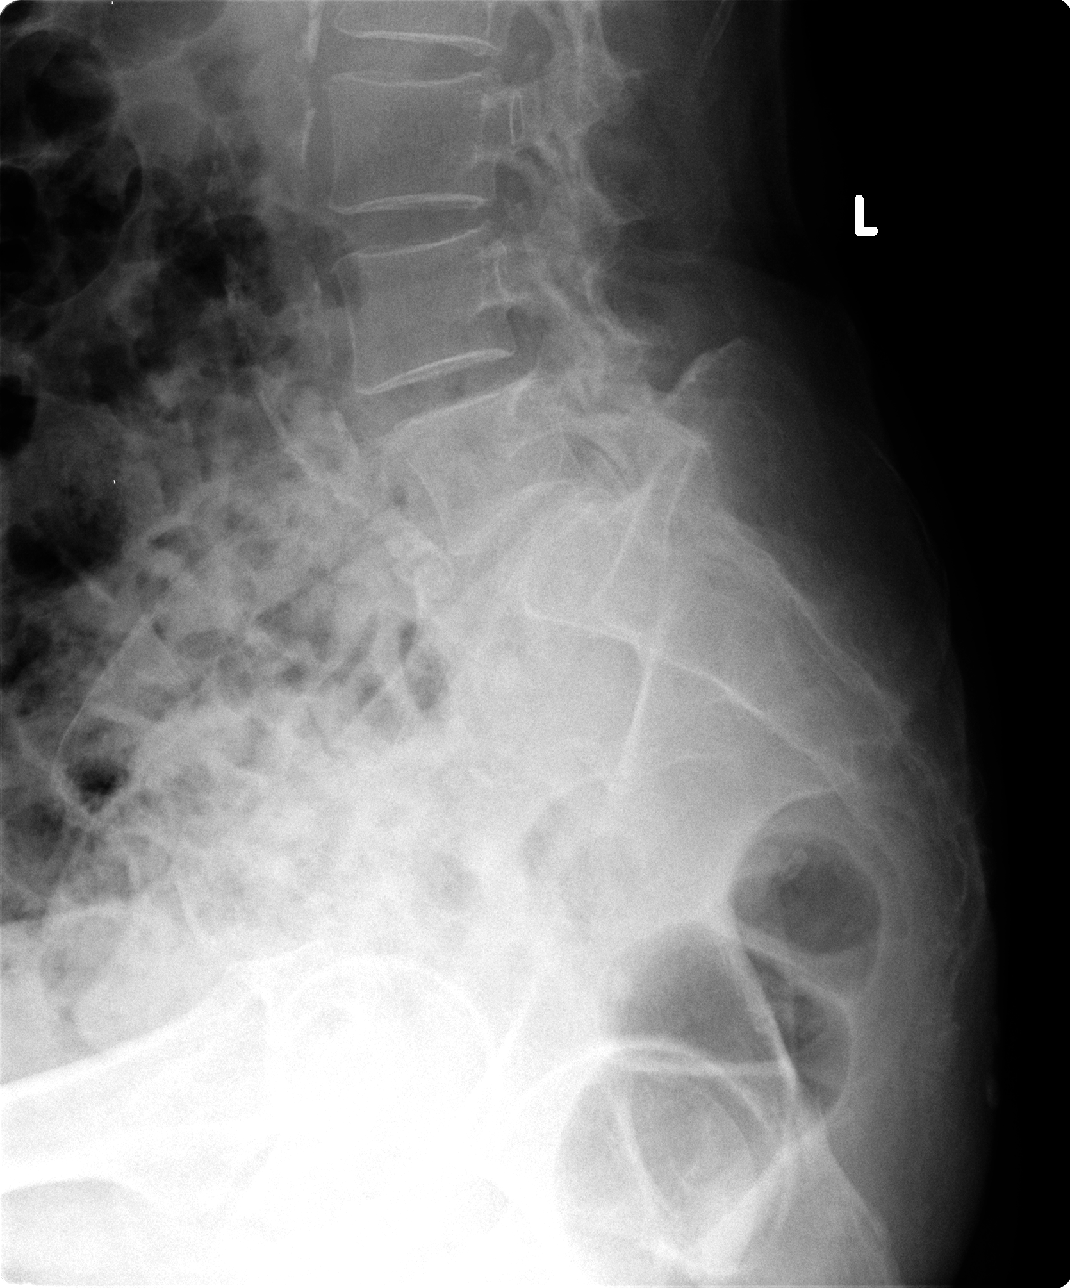

[3 of 3 positions shown; findings below may reference images not displayed]

FINDINGS: Mild levoscoliosis of the lumbar spine.

Mild facet joint degenerative changes lower lumbar region.  Minimal
anterior slip of L4.  Very mild L4-5 disc space narrowing.

Calcified ectatic aorta.  Caliber of the aorta not adequately
assessed on the present exam.
IMPRESSION: Mild levoscoliosis of the lumbar spine.

Mild facet joint degenerative changes lower lumbar region.

Minimal anterior slip of L4.  Very mild L4-5 disc space narrowing.

Calcified ectatic aorta.  Caliber of the aorta not adequately
assessed on the present exam.

## 2013-03-04 ENCOUNTER — Other Ambulatory Visit: Payer: Self-pay | Admitting: Oncology

## 2013-03-04 DIAGNOSIS — C50919 Malignant neoplasm of unspecified site of unspecified female breast: Secondary | ICD-10-CM

## 2013-03-08 ENCOUNTER — Other Ambulatory Visit (INDEPENDENT_AMBULATORY_CARE_PROVIDER_SITE_OTHER): Payer: Medicare Other

## 2013-03-08 ENCOUNTER — Encounter: Payer: Self-pay | Admitting: Internal Medicine

## 2013-03-08 ENCOUNTER — Ambulatory Visit (INDEPENDENT_AMBULATORY_CARE_PROVIDER_SITE_OTHER): Payer: Medicare Other | Admitting: Internal Medicine

## 2013-03-08 ENCOUNTER — Other Ambulatory Visit: Payer: Self-pay | Admitting: Internal Medicine

## 2013-03-08 VITALS — BP 110/80 | HR 91 | Temp 98.3°F | Ht 60.0 in | Wt 94.2 lb

## 2013-03-08 DIAGNOSIS — Z23 Encounter for immunization: Secondary | ICD-10-CM | POA: Diagnosis not present

## 2013-03-08 DIAGNOSIS — D649 Anemia, unspecified: Secondary | ICD-10-CM

## 2013-03-08 DIAGNOSIS — E785 Hyperlipidemia, unspecified: Secondary | ICD-10-CM

## 2013-03-08 DIAGNOSIS — E538 Deficiency of other specified B group vitamins: Secondary | ICD-10-CM

## 2013-03-08 DIAGNOSIS — R7309 Other abnormal glucose: Secondary | ICD-10-CM

## 2013-03-08 DIAGNOSIS — I10 Essential (primary) hypertension: Secondary | ICD-10-CM

## 2013-03-08 DIAGNOSIS — R7302 Impaired glucose tolerance (oral): Secondary | ICD-10-CM

## 2013-03-08 LAB — LIPID PANEL
Cholesterol: 128 mg/dL (ref 0–200)
HDL: 43.3 mg/dL (ref >39.00)
Triglycerides: 78 mg/dL (ref 0.0–149.0)

## 2013-03-08 LAB — CBC WITH DIFFERENTIAL/PLATELET
Basophils Absolute: 0 10*3/uL (ref 0.0–0.1)
Eosinophils Absolute: 0.1 10*3/uL (ref 0.0–0.7)
Hemoglobin: 10.8 g/dL — ABNORMAL LOW (ref 12.0–15.0)
Lymphocytes Relative: 26.8 % (ref 12.0–46.0)
Lymphs Abs: 1.9 10*3/uL (ref 0.7–4.0)
MCHC: 32.6 g/dL (ref 30.0–36.0)
Monocytes Relative: 6.9 % (ref 3.0–12.0)
Neutro Abs: 4.6 10*3/uL (ref 1.4–7.7)
Neutrophils Relative %: 64.1 % (ref 43.0–77.0)
Platelets: 288 10*3/uL (ref 150.0–400.0)
RDW: 15.4 % — ABNORMAL HIGH (ref 11.5–14.6)
WBC: 7.2 10*3/uL (ref 4.5–10.5)

## 2013-03-08 LAB — URINALYSIS, ROUTINE W REFLEX MICROSCOPIC
Bilirubin Urine: NEGATIVE
Ketones, ur: NEGATIVE
Specific Gravity, Urine: 1.025 (ref 1.000–1.030)
Urine Glucose: NEGATIVE
Urobilinogen, UA: 0.2 (ref 0.0–1.0)

## 2013-03-08 LAB — HEPATIC FUNCTION PANEL
ALT: 11 U/L (ref 0–35)
AST: 18 U/L (ref 0–37)
Albumin: 3.5 g/dL (ref 3.5–5.2)
Alkaline Phosphatase: 41 U/L (ref 39–117)
Bilirubin, Direct: 0.1 mg/dL (ref 0.0–0.3)
Total Protein: 6.8 g/dL (ref 6.0–8.3)

## 2013-03-08 LAB — BASIC METABOLIC PANEL
CO2: 20 mEq/L (ref 19–32)
Chloride: 116 mEq/L — ABNORMAL HIGH (ref 96–112)
GFR: 64.47 mL/min (ref >60.00)
Glucose, Bld: 94 mg/dL (ref 70–99)
Potassium: 3.8 mEq/L (ref 3.5–5.1)
Sodium: 142 mEq/L (ref 135–145)

## 2013-03-08 LAB — LACTATE DEHYDROGENASE: LDH: 113 U/L (ref 94–250)

## 2013-03-08 LAB — TSH: TSH: 0.72 u[IU]/mL (ref 0.35–5.50)

## 2013-03-08 LAB — HEMOGLOBIN A1C: Hgb A1c MFr Bld: 6.3 % (ref 4.6–6.5)

## 2013-03-08 MED ORDER — LOVASTATIN 40 MG PO TABS: 40.0000 mg | ORAL_TABLET | Freq: Every day | ORAL | Status: DC

## 2013-03-08 MED ORDER — LOSARTAN POTASSIUM 50 MG PO TABS: 50.0000 mg | ORAL_TABLET | Freq: Every day | ORAL | Status: DC

## 2013-03-08 MED ORDER — CEPHALEXIN 500 MG PO CAPS: 500.0000 mg | ORAL_CAPSULE | Freq: Four times a day (QID) | ORAL | Status: DC

## 2013-03-08 MED ORDER — HYDROCODONE-ACETAMINOPHEN 5-325 MG PO TABS: 1.0000 | ORAL_TABLET | Freq: Three times a day (TID) | ORAL | Status: DC | PRN

## 2013-03-08 NOTE — Patient Instructions (Addendum)
You had the flu shot today Please make return Nurse visit for the Prevnar Pneumonia shot in 2 wks Please continue all other medications as before, and refills have been done if requested. Please have the pharmacy call with any other refills you may need. Please continue your efforts at being more active, low cholesterol diet, and weight control. You are otherwise up to date with prevention measures today. Please keep your appointments with your specialists as you have planned Please go to the LAB in the Basement (turn left off the elevator) for the tests to be done today You will be contacted by phone if any changes need to be made immediately.  Otherwise, you will receive a letter about your results with an explanation, but please check with MyChart first.  Please remember to sign up for My Chart if you have not done so, as this will be important to you in the future with finding out test results, communicating by private email, and scheduling acute appointments online when needed.  Please return in 6 months, or sooner if needed

## 2013-03-08 NOTE — Assessment & Plan Note (Signed)
stable overall by history and exam, recent data reviewed with pt, and pt to continue medical treatment as before,  to f/u any worsening symptoms or concerns Lab Results  Component Value Date   LDLCALC 97 11/11/2011

## 2013-03-08 NOTE — Progress Notes (Signed)
Subjective:    Patient ID: Tammy Wolfe, female    DOB: 12-09-1940, 72 y.o.   MRN: [id number]  HPI  Here for yearly f/u;  Overall doing ok;  Pt denies CP, worsening SOB, DOE, wheezing, orthopnea, PND, worsening LE edema, palpitations, dizziness or syncope.  Pt denies neurological change such as new headache, facial or extremity weakness.  Pt denies polydipsia, polyuria, or low sugar symptoms. Pt states overall good compliance with treatment and medications, good tolerability, and has been trying to follow lower cholesterol diet.  Pt denies worsening depressive symptoms, suicidal ideation or panic. No fever, night sweats, wt loss, loss of appetite, or other constitutional symptoms.  Pt states good ability with ADL's, has low fall risk, home safety reviewed and adequate, no other significant changes in hearing or vision, and only occasionally active with exercise.  Was not able to get urine testing done may 2014, but cephalexin seemed to help.  Not having OV with Dr Darnelle Catalan who thought she could simply f/u labs here, and has standing orders.  Pt continues to have recurring LBP without change in severity, bowel or bladder change, fever, wt loss,  worsening LE pain/numbness/weakness, gait change or falls. Asks for hydrocodone  - last rx was for 100 in dec 2013.  Has ongoing signficnat osteoporosis - last t-score worst -2.9 right hip, currently on tamoxifen, holding on to that for now instead of change to aromatse inhibitor as osteoporosis might become worse. Sees Dr Dwain Sarna for f/u breast ca bilat mastectomies - next due April 2015, last mammogram Jan 08, 2011, no longer gets mammograms s/p surgury.  Due for f/u labs.  Denies worsening depressive symptoms, suicidal ideation, or panic; has ongoing anxiety, not increased recently, doesnot need xanax refill now Past Medical History  Diagnosis Date  . ABNORMAL THYROID FUNCTION TESTS 12/10/2009  . ALCOHOLIC HEPATITIS, HX OF 02/14/2007  . ALLERGIC RHINITIS 02/14/2007   . ANXIETY 03/19/2007  . Breast implant removal status 12/23/2008  . COLONIC POLYPS, HX OF 02/14/2007  . GOITER, MULTINODULAR 02/14/2007  . HX, PERSONAL, ALCOHOLISM 02/14/2007  . HYPERLIPIDEMIA 02/14/2007  . HYPERTENSION 02/14/2007  . OSTEOARTHRITIS 02/14/2007  . OSTEOPOROSIS 02/14/2007  . PANCREATITIS, HX OF 02/14/2007  . Anemia, unspecified 04/26/2012  . Breast cancer   . Insomnia    Past Surgical History  Procedure Laterality Date  . Cataract extraction    . S/p jaw and periodontal surgury  2009  . Peridontal  2010  . Breast surgery  2010    silicone prepectoral implants explanted by Dr. Shon Hough after rupture  . Breast surgery      left simple mastectomy and left axillary sentinel node biopsy, right prophylactic mastectomy    reports that she has been smoking Cigarettes.  She has been smoking about 0.50 packs per day. She has never used smokeless tobacco. She reports that she drinks alcohol. She reports that she does not use illicit drugs. family history is not on file. She was adopted. Allergies  Allergen Reactions  . Tetracycline Anaphylaxis   Current Outpatient Prescriptions on File Prior to Visit  Medication Sig Dispense Refill  . ALPRAZolam (XANAX) 0.25 MG tablet TAKE ONE TABLET THREE TIMES DAILY AS NEEDED  90 tablet  5  . Cholecalciferol (VITAMIN D) 1000 UNITS capsule Take 1,000 Units by mouth daily.        . cyanocobalamin ([facility]S VITAMIN B12) 1000 MCG tablet Take 0.5 tablets (500 mcg total) by mouth daily.  30 tablet  4  . tamoxifen (NOLVADEX) 20 MG  tablet Take 1 tablet (20 mg total) by mouth daily.  90 tablet  8  . tamoxifen (NOLVADEX) 20 MG tablet TAKE ONE TABLET BY MOUTH ONCE DAILY  90 tablet  0   No current facility-administered medications on file prior to visit.    Review of Systems  Constitutional: Negative for unexpected weight change, or unusual diaphoresis  HENT: Negative for tinnitus.   Eyes: Negative for photophobia and visual disturbance.  Respiratory:  Negative for choking and stridor.   Gastrointestinal: Negative for vomiting and blood in stool.  Genitourinary: Negative for hematuria and decreased urine volume.  Musculoskeletal: Negative for acute joint swelling Skin: Negative for color change and wound.  Neurological: Negative for tremors and numbness other than noted  Psychiatric/Behavioral: Negative for decreased concentration or  hyperactivity.       Objective:   Physical Exam BP 110/80  Pulse 91  Temp(Src) 98.3 F (36.8 C) (Oral)  Ht 5' (1.524 m)  Wt 94 lb 4 oz (42.752 kg)  BMI 18.41 kg/m2  SpO2 97% VS noted,  Constitutional: Pt appears well-developed and well-nourished. Timoteo Gaul HENT: Head: NCAT.  Right Ear: External ear normal.  Left Ear: External ear normal.  Eyes: Conjunctivae and EOM are normal. Pupils are equal, round, and reactive to light.  Neck: Normal range of motion. Neck supple.  Cardiovascular: Normal rate and regular rhythm.   Pulmonary/Chest: Effort normal and breath sounds normal.  Abd:  Soft, NT, non-distended, + BS Neurological: Pt is alert. Not confused  Skin: Skin is warm. No erythema.  Psychiatric: Pt behavior is normal. Thought content normal.        Assessment & Plan:

## 2013-03-08 NOTE — Assessment & Plan Note (Addendum)
For f/u labs today, clinically stable  Note:  Total time for pt hx, exam, review of record with pt in the room, determination of diagnoses and plan for further eval and tx is > 40 min, with over 50% spent in coordination and counseling of patient

## 2013-03-08 NOTE — Addendum Note (Signed)
Addended by: Corwin Levins on: 03/08/2013 01:55 PM   Modules accepted: Orders

## 2013-03-08 NOTE — Assessment & Plan Note (Signed)
stable overall by history and exam, recent data reviewed with pt, and pt to continue medical treatment as before,  to f/u any worsening symptoms or concerns BP Readings from Last 3 Encounters:  03/08/13 110/80  10/06/12 130/80  09/27/12 102/64

## 2013-03-08 NOTE — Assessment & Plan Note (Signed)
Asymtp, for a1c 

## 2013-03-22 ENCOUNTER — Other Ambulatory Visit: Payer: Self-pay | Admitting: *Deleted

## 2013-03-22 ENCOUNTER — Ambulatory Visit (HOSPITAL_BASED_OUTPATIENT_CLINIC_OR_DEPARTMENT_OTHER): Payer: Medicare Other | Admitting: Lab

## 2013-03-22 ENCOUNTER — Encounter: Payer: Self-pay | Admitting: *Deleted

## 2013-03-22 ENCOUNTER — Other Ambulatory Visit (HOSPITAL_BASED_OUTPATIENT_CLINIC_OR_DEPARTMENT_OTHER): Payer: Medicare Other | Admitting: Lab

## 2013-03-22 ENCOUNTER — Telehealth: Payer: Self-pay | Admitting: Oncology

## 2013-03-22 ENCOUNTER — Ambulatory Visit (HOSPITAL_BASED_OUTPATIENT_CLINIC_OR_DEPARTMENT_OTHER): Payer: Medicare Other | Admitting: Oncology

## 2013-03-22 VITALS — BP 123/73 | HR 101 | Temp 98.6°F | Resp 19 | Ht 60.0 in | Wt 90.5 lb

## 2013-03-22 DIAGNOSIS — C50912 Malignant neoplasm of unspecified site of left female breast: Secondary | ICD-10-CM

## 2013-03-22 DIAGNOSIS — E538 Deficiency of other specified B group vitamins: Secondary | ICD-10-CM | POA: Diagnosis not present

## 2013-03-22 DIAGNOSIS — C50919 Malignant neoplasm of unspecified site of unspecified female breast: Secondary | ICD-10-CM | POA: Diagnosis not present

## 2013-03-22 DIAGNOSIS — C50412 Malignant neoplasm of upper-outer quadrant of left female breast: Secondary | ICD-10-CM

## 2013-03-22 DIAGNOSIS — Z170 Estrogen receptor positive status [ER+]: Secondary | ICD-10-CM

## 2013-03-22 DIAGNOSIS — C50419 Malignant neoplasm of upper-outer quadrant of unspecified female breast: Secondary | ICD-10-CM | POA: Diagnosis not present

## 2013-03-22 DIAGNOSIS — M810 Age-related osteoporosis without current pathological fracture: Secondary | ICD-10-CM

## 2013-03-22 DIAGNOSIS — F17200 Nicotine dependence, unspecified, uncomplicated: Secondary | ICD-10-CM | POA: Diagnosis not present

## 2013-03-22 LAB — COMPREHENSIVE METABOLIC PANEL (CC13)
ALT: 9 U/L (ref 0–55)
AST: 16 U/L (ref 5–34)
Anion Gap: 9 mEq/L (ref 3–11)
CO2: 19 mEq/L — ABNORMAL LOW (ref 22–29)
Calcium: 8.8 mg/dL (ref 8.4–10.4)
Creatinine: 0.8 mg/dL (ref 0.6–1.1)
Glucose: 118 mg/dl (ref 70–140)
Potassium: 3.5 mEq/L (ref 3.5–5.1)
Sodium: 143 mEq/L (ref 136–145)
Total Bilirubin: 0.33 mg/dL (ref 0.20–1.20)
Total Protein: 7.2 g/dL (ref 6.4–8.3)

## 2013-03-22 LAB — URINALYSIS, MICROSCOPIC - CHCC
Bilirubin (Urine): NEGATIVE
Ketones: NEGATIVE mg/dL
Leukocyte Esterase: NEGATIVE
Specific Gravity, Urine: 1.015 (ref 1.003–1.035)
pH: 6 (ref 4.6–8.0)

## 2013-03-22 LAB — CBC WITH DIFFERENTIAL/PLATELET
BASO%: 0.7 % (ref 0.0–2.0)
EOS%: 1.9 % (ref 0.0–7.0)
Eosinophils Absolute: 0.2 10*3/uL (ref 0.0–0.5)
MCH: 25.6 pg (ref 25.1–34.0)
MCHC: 31.9 g/dL (ref 31.5–36.0)
MCV: 80.4 fL (ref 79.5–101.0)
MONO%: 5.7 % (ref 0.0–14.0)
NEUT#: 5.9 10*3/uL (ref 1.5–6.5)
RBC: 4.27 10*6/uL (ref 3.70–5.45)
RDW: 15.2 % — ABNORMAL HIGH (ref 11.2–14.5)
WBC: 8.4 10*3/uL (ref 3.9–10.3)
lymph#: 1.8 10*3/uL (ref 0.9–3.3)

## 2013-03-22 LAB — VITAMIN D 25 HYDROXY (VIT D DEFICIENCY, FRACTURES): Vit D, 25-Hydroxy: 36 ng/mL (ref 30–89)

## 2013-03-22 LAB — VITAMIN B12: Vitamin B-12: 459 pg/mL (ref 211–911)

## 2013-03-22 MED ORDER — TAMOXIFEN CITRATE 20 MG PO TABS: 20.0000 mg | ORAL_TABLET | Freq: Every day | ORAL | Status: DC

## 2013-03-22 NOTE — Progress Notes (Signed)
[facility]  [phone] Fax:([phone]) [phone]  OFFICE PROGRESS NOTE   ID: Tammy Wolfe   DOB: [birth year]  MR#: [id number]  GEX#:[id number]   PCP: Tammy Barre, MD SURGEON:  Tammy Loron, MD  HISTORY OF PRESENT ILLNESS: From Dr. Pierce Wolfe is New Patient Evaluation Note dated 12/20/2010: "This is a very pleasant 72 year old woman who is here with her daughter, Tammy Wolfe, to discuss her recently diagnosed breast cancer. This woman has been in good health.  She is a [employer] native.  She has undergone annual screening mammography.  She had a screening mammogram on 11/12/2010 which showed the presence of bilateral silicone implants as well as numerous dense hyperdense material.  BSGI was recommended because of this.  A BSGI was performed which showed a 1.4 cm focus of moderate intense activity upper outer quadrant at the 10 o'clock position on the left.  There was a 1.6 cm focus of isotope activity at the 3 o'clock position.  Bilateral ultrasounds were used to evaluate these areas.  An ultrasound did suggest the presence of a mass at the 11 o'clock position in the right breast.  Both these areas were biopsied on 11/20/2010 and pathology did show an unremarkable biopsy on the right; on the left there was an invasive ductal cancer grade 2-3 with LVI noted.  This was felt to be a grade 2-3 lesion, ER positive 100%, PR positive 98%, proliferative index 23%, HER-2 was not amplified with a ratio of 1.48.  The patient did undergo an MRI scan of both breasts.  Initially had a 1.2 x 1.2 x 0.7 cm mass left breast consistent with invasive ductal cancer; on the right side there was a 1.1 x 0.8 x 0.7 cm mass.  The patient elected to undergo bilateral mastectomies which she did on 12/10/2010.  On the left side there was invasive ductal cancer measuring 1.4 cm and was grade 1 of 3 with two negative sentinel lymph nodes.  On the right side there was a benign lymph node and no evidence of  malignancy.  Surgical biopsies were all clear.  Once again the prognostic panel suggested a low proliferative index and non-amplified ratio of 1.48.  The patient has had an unremarkable postoperative course."  Her subsequent history is as detailed below.  INTERVAL HISTORY: Tammy Wolfe returns today for followup of her breast cancer. The interval history is generally unremarkable. She had some left flank discomfort and Dr. Jonny Wolfe documented a urinary tract infection, which was treated with Keflex. She was interested in repeating a urinalysis today, which we will be glad to do   REVIEW OF SYSTEMS: The left flank pain she was experiencing has resolved. A detailed review of systems today is otherwise negative and in particular she is tolerating the tamoxifen with no hot flashes vaginal wetness or other issues that she is aware of  PAST MEDICAL HISTORY: Past Medical History  Diagnosis Date  . ABNORMAL THYROID FUNCTION TESTS 12/10/2009  . ALCOHOLIC HEPATITIS, HX OF 02/14/2007  . ALLERGIC RHINITIS 02/14/2007  . ANXIETY 03/19/2007  . Breast implant removal status 12/23/2008  . COLONIC POLYPS, HX OF 02/14/2007  . GOITER, MULTINODULAR 02/14/2007  . HX, PERSONAL, ALCOHOLISM 02/14/2007  . HYPERLIPIDEMIA 02/14/2007  . HYPERTENSION 02/14/2007  . OSTEOARTHRITIS 02/14/2007  . OSTEOPOROSIS 02/14/2007  . PANCREATITIS, HX OF 02/14/2007  . Anemia, unspecified 04/26/2012  . Breast cancer   . Insomnia     PAST SURGICAL HISTORY: Past Surgical History  Procedure Laterality Date  . Cataract extraction    .  S/p jaw and periodontal surgury  2009  . Peridontal  2010  . Breast surgery  2010    silicone prepectoral implants explanted by Dr. Shon Wolfe after rupture  . Breast surgery      left simple mastectomy and left axillary sentinel node biopsy, right prophylactic mastectomy    FAMILY HISTORY Family History  Problem Relation Age of Onset  . Adopted: Yes    GYNECOLOGIC HISTORY: She is G3, P3 and she was on hormone  replacement therapy for 15 years but discontinued in 2007.  Menarche at age 65.  SOCIAL HISTORY: She has been divorced for 30 years after being married twice.  Currently lives alone.  [place] [employer] [place] 20.  She has 3 children, two in [place] and one in [place].  She has 2 grandchildren, a grandson who is attending [employer]. infrequently comes for supper, and a granddaughter going to page, who frequently comes for lunch.   ADVANCED DIRECTIVES: Not on file  HEALTH MAINTENANCE: History  Substance Use Topics  . Smoking status: Current Every Day Smoker -- 0.50 packs/day    Types: Cigarettes  . Smokeless tobacco: Never Used  . Alcohol Use: 0.0 oz/week     Comment: one glass/wine occasional    Colonoscopy: The patient's last colonoscopy we have on record was in 2006 PAP:  04/25/2008 Bone density:  Last bone density scan was on 03/04/2011 which showed osteoporosis with T score of -2.9 Lipid panel: 11/11/2011  Allergies  Allergen Reactions  . Tetracycline Anaphylaxis    Current Outpatient Prescriptions  Medication Sig Dispense Refill  . ALPRAZolam (XANAX) 0.25 MG tablet TAKE ONE TABLET THREE TIMES DAILY AS NEEDED  90 tablet  5  . cephALEXin (KEFLEX) 500 MG capsule Take 1 capsule (500 mg total) by mouth 4 (four) times daily.  28 capsule  0  . Cholecalciferol (VITAMIN D) 1000 UNITS capsule Take 1,000 Units by mouth daily.        . cyanocobalamin ([facility] VITAMIN B12) 1000 MCG tablet Take 0.5 tablets (500 mcg total) by mouth daily.  30 tablet  4  . HYDROcodone-acetaminophen (NORCO/VICODIN) 5-325 MG per tablet Take 1 tablet by mouth every 8 (eight) hours as needed for pain.  100 tablet  0  . losartan (COZAAR) 50 MG tablet Take 1 tablet (50 mg total) by mouth daily.  90 tablet  3  . lovastatin (MEVACOR) 40 MG tablet Take 1 tablet (40 mg total) by mouth daily.  90 tablet  3  . tamoxifen (NOLVADEX) 20 MG tablet Take 1 tablet (20 mg total) by mouth  daily.  90 tablet  8  . tamoxifen (NOLVADEX) 20 MG tablet TAKE ONE TABLET BY MOUTH ONCE DAILY  90 tablet  0   No current facility-administered medications for this visit.    OBJECTIVE: Older a white woman in no acute distress Filed Vitals:   03/22/13 1020  BP: 123/73  Pulse: 101  Temp: 98.6 F (37 C)  Resp: 19     Body mass index is 17.67 kg/(m^2).      ECOG FS:  0           Sclerae unicteric, pupils equal round and reactive Oropharynx no thrush or other lesions No cervical or supraclavicular adenopathy Lungs no rales or rhonchi Heart regular rate and rhythm Abd soft, nontender, positive bowel sounds MSK no focal spinal tenderness, no peripheral edema Neuro: nonfocal, well oriented, anxious affect Breasts: Status post bilateral mastectomies. There is no  evidence of chest wall recurrence. Both axillae are benign.   LAB RESULTS: Lab Results  Component Value Date   WBC 8.4 03/22/2013   NEUTROABS 5.9 03/22/2013   HGB 11.0* 03/22/2013   HCT 34.4* 03/22/2013   MCV 80.4 03/22/2013   PLT 302 03/22/2013      Chemistry      Component Value Date/Time   NA 142 03/08/2013 0908   NA 138 03/19/2012 1019   K 3.8 03/08/2013 0908   K 4.1 03/19/2012 1019   CL 116* 03/08/2013 0908   CL 111* 03/19/2012 1019   CO2 20 03/08/2013 0908   CO2 18* 03/19/2012 1019   BUN 20 03/08/2013 0908   BUN 21.0 03/19/2012 1019   CREATININE 0.9 03/08/2013 0908   CREATININE 0.8 03/19/2012 1019      Component Value Date/Time   CALCIUM 8.6 03/08/2013 0908   CALCIUM 8.7 03/19/2012 1019   ALKPHOS 41 03/08/2013 0908   ALKPHOS 86 03/19/2012 1019   AST 18 03/08/2013 0908   AST 28 03/19/2012 1019   ALT 11 03/08/2013 0908   ALT 18 03/19/2012 1019   BILITOT 0.4 03/08/2013 0908   BILITOT 0.50 03/19/2012 1019      Lab Results  Component Value Date   LABCA2 24 12/20/2010    Urinalysis    Component Value Date/Time   COLORURINE LT. YELLOW 03/08/2013 0908   APPEARANCEUR CLEAR 03/08/2013 0908    LABSPEC 1.025 03/08/2013 0908   LABSPEC 1.010 03/19/2012 1044   PHURINE 6.0 03/08/2013 0908   HGBUR SMALL 03/08/2013 0908   BILIRUBINUR NEGATIVE 03/08/2013 0908   KETONESUR NEGATIVE 03/08/2013 0908   UROBILINOGEN 0.2 03/08/2013 0908   NITRITE POSITIVE 03/08/2013 0908   LEUKOCYTESUR LARGE 03/08/2013 0908    STUDIES: No results found.   ASSESSMENT: 72 y.o. [place], [place]: 1.  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  2.  Status post bilateral breast MRI on 11/18/2010 which noted a history of bilateral silicone breast implants removed in 2010 due to implant rupture.  The MRI also showed a left breast 1.2 cm mass at the 2:30 position, and a 1.1 cm intramammary lymph node on the right breast.    3.  Status post right breast needle core biopsy on 11/20/2010 which showed no evidence of malignancy.  4.  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 The patient started antiestrogen therapy with Tamoxifen in 11/2010.  6.  Osteoporosis with T-2.9 score 03/04/2011 (at [facility])  PLAN: Tammy Wolfe is doing fine from a breast cancer point of view, and the plan is going to be to continue the tamoxifen for 5 years and then likely release her from followup. We did discuss tobacco cessation, and at this point she is not prepared to go through a program to help her quit smoking. We are going to see her again in April, and at that point we can start seeing her on a once a year basis alternating with her primary care physician.  She knows to call for any problems that may develop before her next  visit here.  03/22/2013, 10:38 AM

## 2013-03-29 ENCOUNTER — Ambulatory Visit (INDEPENDENT_AMBULATORY_CARE_PROVIDER_SITE_OTHER): Payer: Medicare Other

## 2013-03-29 DIAGNOSIS — Z23 Encounter for immunization: Secondary | ICD-10-CM

## 2013-07-26 ENCOUNTER — Other Ambulatory Visit: Payer: Self-pay | Admitting: Internal Medicine

## 2013-07-26 NOTE — Telephone Encounter (Signed)
Faxed hard copy to [facility].  

## 2013-07-26 NOTE — Telephone Encounter (Signed)
Done hardcopy to robin  

## 2013-08-22 ENCOUNTER — Other Ambulatory Visit: Payer: Self-pay | Admitting: Physician Assistant

## 2013-08-23 ENCOUNTER — Telehealth: Payer: Self-pay | Admitting: Oncology

## 2013-08-23 NOTE — Telephone Encounter (Signed)
, °

## 2013-09-06 ENCOUNTER — Ambulatory Visit (INDEPENDENT_AMBULATORY_CARE_PROVIDER_SITE_OTHER): Payer: Medicare Other | Admitting: Internal Medicine

## 2013-09-06 ENCOUNTER — Encounter: Payer: Self-pay | Admitting: Internal Medicine

## 2013-09-06 VITALS — BP 120/80 | HR 112 | Temp 98.0°F | Ht 60.0 in | Wt 97.5 lb

## 2013-09-06 DIAGNOSIS — Z79899 Other long term (current) drug therapy: Secondary | ICD-10-CM | POA: Diagnosis not present

## 2013-09-06 DIAGNOSIS — R7302 Impaired glucose tolerance (oral): Secondary | ICD-10-CM

## 2013-09-06 DIAGNOSIS — I10 Essential (primary) hypertension: Secondary | ICD-10-CM | POA: Diagnosis not present

## 2013-09-06 DIAGNOSIS — Z87891 Personal history of nicotine dependence: Secondary | ICD-10-CM | POA: Insufficient documentation

## 2013-09-06 DIAGNOSIS — E785 Hyperlipidemia, unspecified: Secondary | ICD-10-CM | POA: Diagnosis not present

## 2013-09-06 DIAGNOSIS — R918 Other nonspecific abnormal finding of lung field: Secondary | ICD-10-CM

## 2013-09-06 DIAGNOSIS — R7309 Other abnormal glucose: Secondary | ICD-10-CM

## 2013-09-06 DIAGNOSIS — F17200 Nicotine dependence, unspecified, uncomplicated: Secondary | ICD-10-CM | POA: Diagnosis not present

## 2013-09-06 DIAGNOSIS — C50919 Malignant neoplasm of unspecified site of unspecified female breast: Secondary | ICD-10-CM

## 2013-09-06 DIAGNOSIS — R9389 Abnormal findings on diagnostic imaging of other specified body structures: Secondary | ICD-10-CM

## 2013-09-06 DIAGNOSIS — Z720 Tobacco use: Secondary | ICD-10-CM

## 2013-09-06 HISTORY — DX: Nicotine dependence, unspecified, uncomplicated: F17.200

## 2013-09-06 MED ORDER — TAMOXIFEN CITRATE 20 MG PO TABS: ORAL_TABLET | ORAL | Status: DC

## 2013-09-06 MED ORDER — HYDROCODONE-ACETAMINOPHEN 5-325 MG PO TABS: 1.0000 | ORAL_TABLET | Freq: Two times a day (BID) | ORAL | Status: DC | PRN

## 2013-09-06 NOTE — Progress Notes (Signed)
Subjective:    Patient ID: Tammy Wolfe, female    DOB: June 18, 1940, 73 y.o.   MRN: [id number]  HPI  Here to f/u; overall doing ok,  Pt denies chest pain, increased sob or doe, wheezing, orthopnea, PND, increased LE swelling, palpitations, dizziness or syncope.  Pt denies polydipsia, polyuria, or low sugar symptoms such as weakness or confusion improved with po intake.  Pt denies new neurological symptoms such as new headache, or facial or extremity weakness or numbness.   Pt states overall good compliance with meds, has been trying to follow lower cholesterol diet, with wt overall stable,  but little exercise however. Does have several wks ongoing nasal allergy symptoms with clearish congestion, itch and sneezing, without fever, pain, ST, cough, swelling or wheezing, but better with OTC.   Pt continues to have recurring LBP without change in severity, bowel or bladder change, fever, wt loss,  worsening LE pain/numbness/weakness, gait change or falls.  Only take the hydrocodone average twice per wk or so. Denies worsening depressive symptoms, suicidal ideation, or panic; has ongoing anxiety, not increased recently, and no worsening panic attacks over baseline.  Cannot quit smoking, has not had screening chest ct. Due for f/u oncology next mo for f/u breast ca, asks for 1 mo tamoxifen to bridge as she is out. Past Medical History  Diagnosis Date  . ABNORMAL THYROID FUNCTION TESTS 12/10/2009  . ALCOHOLIC HEPATITIS, HX OF 02/14/2007  . ALLERGIC RHINITIS 02/14/2007  . ANXIETY 03/19/2007  . Breast implant removal status 12/23/2008  . COLONIC POLYPS, HX OF 02/14/2007  . GOITER, MULTINODULAR 02/14/2007  . HX, PERSONAL, ALCOHOLISM 02/14/2007  . HYPERLIPIDEMIA 02/14/2007  . HYPERTENSION 02/14/2007  . OSTEOARTHRITIS 02/14/2007  . OSTEOPOROSIS 02/14/2007  . PANCREATITIS, HX OF 02/14/2007  . Anemia, unspecified 04/26/2012  . Breast cancer   . Insomnia    Past Surgical History  Procedure Laterality Date  . Cataract  extraction    . S/p jaw and periodontal surgury  2009  . Peridontal  2010  . Breast surgery  0347    silicone prepectoral implants explanted by Dr. Towanda Malkin after rupture  . Breast surgery      left simple mastectomy and left axillary sentinel node biopsy, right prophylactic mastectomy    reports that she has been smoking Cigarettes.  She has been smoking about 0.50 packs per day. She has never used smokeless tobacco. She reports that she drinks alcohol. She reports that she does not use illicit drugs. family history is not on file. She was adopted. Allergies  Allergen Reactions  . Tetracycline Anaphylaxis   Current Outpatient Prescriptions on File Prior to Visit  Medication Sig Dispense Refill  . ALPRAZolam (XANAX) 0.25 MG tablet TAKE ONE TABLET THREE TIMES DAILY AS NEEDED  90 tablet  5  . Cholecalciferol (VITAMIN D) 1000 UNITS capsule Take 1,000 Units by mouth daily.        . cyanocobalamin ([facility] VITAMIN B12) 1000 MCG tablet Take 0.5 tablets (500 mcg total) by mouth daily.  30 tablet  4  . HYDROcodone-acetaminophen (NORCO/VICODIN) 5-325 MG per tablet Take 1 tablet by mouth every 8 (eight) hours as needed for pain.  100 tablet  0  . losartan (COZAAR) 50 MG tablet Take 1 tablet (50 mg total) by mouth daily.  90 tablet  3  . lovastatin (MEVACOR) 40 MG tablet Take 1 tablet (40 mg total) by mouth daily.  90 tablet  3  . tamoxifen (NOLVADEX) 20 MG tablet TAKE ONE TABLET BY  MOUTH ONCE DAILY  90 tablet  0   No current facility-administered medications on file prior to visit.    Review of Systems  Constitutional: Negative for unexpected weight change, or unusual diaphoresis  HENT: Negative for tinnitus.   Eyes: Negative for photophobia and visual disturbance.  Respiratory: Negative for choking and stridor.   Gastrointestinal: Negative for vomiting and blood in stool.  Genitourinary: Negative for hematuria and decreased urine volume.  Musculoskeletal: Negative for acute joint  swelling Skin: Negative for color change and wound.  Neurological: Negative for tremors and numbness other than noted  Psychiatric/Behavioral: Negative for decreased concentration or  hyperactivity.       Objective:   Physical Exam BP 120/80  Pulse 112  Temp(Src) 98 F (36.7 C) (Oral)  Ht 5' (1.524 m)  Wt 97 lb 8 oz (44.226 kg)  BMI 19.04 kg/m2  SpO2 94% VS noted,  Constitutional: Pt appears well-developed and well-nourished.  HENT: Head: NCAT.  Right Ear: External ear normal.  Left Ear: External ear normal.  Eyes: Conjunctivae and EOM are normal. Pupils are equal, round, and reactive to light.  Neck: Normal range of motion. Neck supple.  Cardiovascular: Normal rate and regular rhythm.   Pulmonary/Chest: Effort normal and breath sounds normal.  Abd:  Soft, NT, non-distended, + BS Neurological: Pt is alert. Not confused  Skin: Skin is warm. No erythema.  Psychiatric: Pt behavior is normal. Thought content normal. seems overall less nervous today    Assessment & Plan:

## 2013-09-06 NOTE — Progress Notes (Signed)
Pre visit review using our clinic review tool, if applicable. No additional management support is needed unless otherwise documented below in the visit note. 

## 2013-09-06 NOTE — Assessment & Plan Note (Signed)
stable overall by history and exam, recent data reviewed with pt, and pt to continue medical treatment as before,  to f/u any worsening symptoms or concerns Lab Results  Component Value Date   LDLCALC 69 03/08/2013   Lab Results  Component Value Date   WBC 8.4 03/22/2013   HGB 11.0* 03/22/2013   HCT 34.4* 03/22/2013   PLT 302 03/22/2013   GLUCOSE 118 03/22/2013   CHOL 128 03/08/2013   TRIG 78.0 03/08/2013   HDL 43.30 03/08/2013   LDLCALC 69 03/08/2013   ALT 9 03/22/2013   AST 16 03/22/2013   NA 143 03/22/2013   K 3.5 03/22/2013   CL 116* 03/08/2013   CREATININE 0.8 03/22/2013   BUN 15.3 03/22/2013   CO2 19* 03/22/2013   TSH 0.72 03/08/2013   HGBA1C 6.3 03/08/2013

## 2013-09-06 NOTE — Assessment & Plan Note (Signed)
Medicare now will cover lung cancer screening with yearly CT chest low dose; 2012 cxr with ephysematous changes, likely copd  Will ask for ct chest for routine screen

## 2013-09-06 NOTE — Patient Instructions (Signed)
Your tamoxifen was refilled today, as well as the hydrocodone Please continue all other medications as before, and refills have been done if requested. Please have the pharmacy call with any other refills you may need.  Please continue your efforts at being more active, low cholesterol diet, and weight control. You are otherwise up to date with prevention measures today.  Please keep your appointments with your specialists as you have planned  Please remember to sign up for MyChart if you have not done so, as this will be important to you in the future with finding out test results, communicating by private email, and scheduling acute appointments online when needed.  No further lab work felt needed today  You will be contacted regarding the referral for: CT chest for lung cancer screening  Please return in 6 months, or sooner if needed

## 2013-09-06 NOTE — Assessment & Plan Note (Signed)
BP Readings from Last 3 Encounters:  09/06/13 120/80  03/22/13 123/73  03/08/13 110/80   stable overall by history and exam, recent data reviewed with pt, and pt to continue medical treatment as before,  to f/u any worsening symptoms or concerns

## 2013-09-06 NOTE — Assessment & Plan Note (Signed)
stable overall by history and exam, recent data reviewed with pt, and pt to continue medical treatment as before,  to f/u any worsening symptoms or concerns Lab Results  Component Value Date   HGBA1C 6.3 03/08/2013

## 2013-09-07 ENCOUNTER — Telehealth: Payer: Self-pay | Admitting: Internal Medicine

## 2013-09-07 NOTE — Telephone Encounter (Signed)
Relevant patient education assigned to patient using Emmi. ° °

## 2013-09-20 ENCOUNTER — Other Ambulatory Visit: Payer: Medicare Other

## 2013-09-20 ENCOUNTER — Ambulatory Visit: Payer: Medicare Other | Admitting: Physician Assistant

## 2013-10-11 ENCOUNTER — Encounter: Payer: Self-pay | Admitting: Internal Medicine

## 2013-10-14 ENCOUNTER — Encounter (INDEPENDENT_AMBULATORY_CARE_PROVIDER_SITE_OTHER): Payer: Self-pay | Admitting: General Surgery

## 2013-10-14 ENCOUNTER — Ambulatory Visit (INDEPENDENT_AMBULATORY_CARE_PROVIDER_SITE_OTHER): Payer: Medicare Other | Admitting: General Surgery

## 2013-10-14 VITALS — BP 126/80 | HR 68 | Temp 97.6°F | Ht 60.0 in | Wt 92.0 lb

## 2013-10-14 DIAGNOSIS — C50419 Malignant neoplasm of upper-outer quadrant of unspecified female breast: Secondary | ICD-10-CM | POA: Diagnosis not present

## 2013-10-14 DIAGNOSIS — C50412 Malignant neoplasm of upper-outer quadrant of left female breast: Secondary | ICD-10-CM

## 2013-10-14 NOTE — Progress Notes (Signed)
Subjective:     Patient ID: Tammy Wolfe, female   DOB: 24-Apr-1941, 74 y.o.   MRN: [id number]  HPI This is a 73 year old female who is a long-term smoker who in 2012 underwent bilateral mastectomies with a left axillary sentinel node biopsy for a stage I hormone receptor positive tumor. She has been on tamoxifen since then which she is tolerating well. She has had a cough for some time now. She has been evaluated by Dr. Jenny Reichmann and has been recommended he get a chest CT which she has been hesitant to get due to her concern. She has no complaints about either site of her prior surgery. She is wearing a prosthetic and is doing very well with this right now. She has no ue complaints.  Review of Systems     Objective:   Physical Exam  Vitals reviewed. Constitutional: She appears well-developed and well-nourished.  Pulmonary/Chest: Right breast exhibits no mass and no skin change. Left breast exhibits no mass and no skin change.    Lymphadenopathy:    She has no cervical adenopathy.    She has no axillary adenopathy.       Right: No supraclavicular adenopathy present.       Left: No supraclavicular adenopathy present.       Assessment:     Stage I breast cancer     Plan:     I encouraged her today to get in touch with Dr. Jenny Reichmann to discuss her chest CT.  Otherwise she has no clinical evidence of a recurrence. We are going to continue every six-month exams. She is due to be seen in the cancer center next I will plan on seeing her back in one year. She is going to continue doing her own exams and will continue with her antiestrogen therapy.

## 2013-10-19 ENCOUNTER — Ambulatory Visit (HOSPITAL_BASED_OUTPATIENT_CLINIC_OR_DEPARTMENT_OTHER): Payer: Medicare Other | Admitting: Physician Assistant

## 2013-10-19 ENCOUNTER — Other Ambulatory Visit (HOSPITAL_BASED_OUTPATIENT_CLINIC_OR_DEPARTMENT_OTHER): Payer: Medicare Other

## 2013-10-19 ENCOUNTER — Encounter: Payer: Self-pay | Admitting: Physician Assistant

## 2013-10-19 ENCOUNTER — Ambulatory Visit (HOSPITAL_COMMUNITY)
Admission: RE | Admit: 2013-10-19 | Discharge: 2013-10-19 | Disposition: A | Discharge status: Home / Self Care | Payer: Medicare Other | Source: Ambulatory Visit | Attending: Physician Assistant | Admitting: Physician Assistant

## 2013-10-19 ENCOUNTER — Telehealth: Payer: Self-pay | Admitting: *Deleted

## 2013-10-19 ENCOUNTER — Telehealth: Payer: Self-pay | Admitting: Oncology

## 2013-10-19 VITALS — BP 114/73 | HR 106 | Temp 98.4°F | Resp 20 | Ht 61.0 in | Wt 93.0 lb

## 2013-10-19 DIAGNOSIS — Z170 Estrogen receptor positive status [ER+]: Secondary | ICD-10-CM

## 2013-10-19 DIAGNOSIS — M810 Age-related osteoporosis without current pathological fracture: Secondary | ICD-10-CM | POA: Diagnosis not present

## 2013-10-19 DIAGNOSIS — C50419 Malignant neoplasm of upper-outer quadrant of unspecified female breast: Secondary | ICD-10-CM

## 2013-10-19 DIAGNOSIS — C50919 Malignant neoplasm of unspecified site of unspecified female breast: Secondary | ICD-10-CM

## 2013-10-19 DIAGNOSIS — C50412 Malignant neoplasm of upper-outer quadrant of left female breast: Secondary | ICD-10-CM

## 2013-10-19 DIAGNOSIS — D649 Anemia, unspecified: Secondary | ICD-10-CM

## 2013-10-19 DIAGNOSIS — M4120 Other idiopathic scoliosis, site unspecified: Secondary | ICD-10-CM | POA: Insufficient documentation

## 2013-10-19 DIAGNOSIS — E876 Hypokalemia: Secondary | ICD-10-CM

## 2013-10-19 DIAGNOSIS — M545 Low back pain, unspecified: Secondary | ICD-10-CM | POA: Diagnosis not present

## 2013-10-19 DIAGNOSIS — M4310 Spondylolisthesis, site unspecified: Secondary | ICD-10-CM | POA: Diagnosis not present

## 2013-10-19 DIAGNOSIS — M1610 Unilateral primary osteoarthritis, unspecified hip: Secondary | ICD-10-CM | POA: Diagnosis not present

## 2013-10-19 DIAGNOSIS — F17200 Nicotine dependence, unspecified, uncomplicated: Secondary | ICD-10-CM

## 2013-10-19 DIAGNOSIS — M169 Osteoarthritis of hip, unspecified: Secondary | ICD-10-CM | POA: Insufficient documentation

## 2013-10-19 LAB — CBC WITH DIFFERENTIAL/PLATELET
BASO%: 0.2 % (ref 0.0–2.0)
Basophils Absolute: 0 10*3/uL (ref 0.0–0.1)
EOS%: 1.6 % (ref 0.0–7.0)
Eosinophils Absolute: 0.1 10*3/uL (ref 0.0–0.5)
HEMATOCRIT: 34.3 % — AB (ref 34.8–46.6)
HGB: 11 g/dL — ABNORMAL LOW (ref 11.6–15.9)
LYMPH%: 18 % (ref 14.0–49.7)
MCH: 27.8 pg (ref 25.1–34.0)
MCHC: 32.1 g/dL (ref 31.5–36.0)
MCV: 86.8 fL (ref 79.5–101.0)
MONO#: 0.5 10*3/uL (ref 0.1–0.9)
MONO%: 5.1 % (ref 0.0–14.0)
NEUT%: 75.1 % (ref 38.4–76.8)
NEUTROS ABS: 6.8 10*3/uL — AB (ref 1.5–6.5)
Platelets: 279 10*3/uL (ref 145–400)
RBC: 3.95 10*6/uL (ref 3.70–5.45)
RDW: 14.2 % (ref 11.2–14.5)
WBC: 9 10*3/uL (ref 3.9–10.3)
lymph#: 1.6 10*3/uL (ref 0.9–3.3)

## 2013-10-19 LAB — COMPREHENSIVE METABOLIC PANEL (CC13)
ALK PHOS: 53 U/L (ref 40–150)
ALT: 12 U/L (ref 0–55)
ANION GAP: 11 meq/L (ref 3–11)
AST: 20 U/L (ref 5–34)
Albumin: 3.6 g/dL (ref 3.5–5.0)
BUN: 14.7 mg/dL (ref 7.0–26.0)
CO2: 17 meq/L — AB (ref 22–29)
Calcium: 8.7 mg/dL (ref 8.4–10.4)
Chloride: 113 mEq/L — ABNORMAL HIGH (ref 98–109)
Creatinine: 0.9 mg/dL (ref 0.6–1.1)
Glucose: 141 mg/dl — ABNORMAL HIGH (ref 70–140)
Potassium: 3.3 mEq/L — ABNORMAL LOW (ref 3.5–5.1)
Sodium: 141 mEq/L (ref 136–145)
Total Bilirubin: 0.38 mg/dL (ref 0.20–1.20)
Total Protein: 7 g/dL (ref 6.4–8.3)

## 2013-10-19 MED ORDER — TAMOXIFEN CITRATE 20 MG PO TABS: ORAL_TABLET | ORAL | Status: DC

## 2013-10-19 MED ORDER — POTASSIUM CHLORIDE ER 10 MEQ PO CPCR: 10.0000 meq | ORAL_CAPSULE | Freq: Two times a day (BID) | ORAL | Status: DC

## 2013-10-19 NOTE — Telephone Encounter (Signed)
Called pt to inform her of results concerning lumbar spine. Communicated with pt that there was no bone lesions. Pt was pleased with results, also I talked with pt abt potassium results(3.3L), told her that  a Rx for potassium has been called into her pharmacy. She is to take 10 mEq tablet twice a day. Pt verbalized understanding. No further concerns. Message to be forwarded to [facility].

## 2013-10-19 NOTE — Progress Notes (Signed)
[place]  [phone] [phone]  OFFICE PROGRESS NOTE   ID: Tammy Wolfe   DOB: November 18, 1940  MR#: [id number]  DUK#:[id number]   PCP: Tammy Cower, MD SURGEON:  Tammy Bookbinder, MD  CHIEF COMPLAINT:  Hx of Left Breast Cancer    HISTORY OF PRESENT ILLNESS: From Tammy Wolfe is New Patient Evaluation Note dated 12/20/2010: "This is a very pleasant 73 year old woman who is here with her daughter, Tammy Wolfe, to discuss her recently diagnosed breast cancer. This woman has been in good health.  She is a [place] native.  She has undergone annual screening mammography.  She had a screening mammogram on 11/12/2010 which showed the presence of bilateral silicone implants as well as numerous dense hyperdense material.  BSGI was recommended because of this.  A BSGI was performed which showed a 1.4 cm focus of moderate intense activity upper outer quadrant at the 10 o'clock position on the left.  There was a 1.6 cm focus of isotope activity at the 3 o'clock position.  Bilateral ultrasounds were used to evaluate these areas.  An ultrasound did suggest the presence of a mass at the 11 o'clock position in the right breast.  Both these areas were biopsied on 11/20/2010 and pathology did show an unremarkable biopsy on the right; on the left there was an invasive ductal cancer grade 2-3 with LVI noted.  This was felt to be a grade 2-3 lesion, ER positive 100%, PR positive 98%, proliferative index 23%, HER-2 was not amplified with a ratio of 1.48.  The patient did undergo an MRI scan of both breasts.  Initially had a 1.2 x 1.2 x 0.7 cm mass left breast consistent with invasive ductal cancer; on the right side there was a 1.1 x 0.8 x 0.7 cm mass.  The patient elected to undergo bilateral mastectomies which she did on 12/10/2010.  On the left side there was invasive ductal cancer measuring 1.4 cm and was grade 1 of 3 with two negative sentinel lymph nodes.  On the right side there was  a benign lymph node and no evidence of malignancy.  Surgical biopsies were all clear.  Once again the prognostic panel suggested a low proliferative index and non-amplified ratio of 1.48.  The patient has had an unremarkable postoperative course."  Her subsequent history is as detailed below.  INTERVAL HISTORY: Tammy Wolfe returns alone today for followup of her left breast cancer. She continues on tamoxifen which she is tolerating well. She has no problems with hot flashes and denies any vaginal changes. Specifically, she has had no vaginal dryness, discharge, or abnormal vaginal bleeding. She denies any signs of abnormal bleeding. She also denies any signs of blood clots. She does continue to smoke at least half a pack of cigarettes daily, and tells me she is "trying to quit".   [name]'s biggest concerns today are in increased cough, and increased pain in the right lower back. She is seen regularly by her primary care physician, Tammy Wolfe. He has actually ordered a chest CT for screening in this smoker who has a chronic cough with shortness of breath. The most recent chest x-ray on file was in 2012, with abnormalities consistent with COPD. Tammy Wolfe is in the process of getting the CT scheduled, and I did encourage her to do so.  Tammy Wolfe has also had increased pain in the right lower back over the past couple of months. She describes this as a rather sharp pain that increases with movement,  especially  when she is going from a standing to sitting or sitting to standing position.  Sitting does not increase the pain. Walking does not increase the pain. She denies any numbness, tingling, or weakness in the right leg. She has a prescription of hydrocodone/APAP from Tammy Wolfe which she has been using for the pain. This does give her some relief.  She denies any additional pain elsewhere, specifically no additional myalgias, arthralgias, or bony pain.   REVIEW OF SYSTEMS: Tammy Wolfe denies any recent fevers, chills, night sweats, or  hot flashes. Her energy level is low, and she does have moderate fatigue. She's had no rashes or skin changes. She denies any signs of abnormal bruising or bleeding. She denies nausea or emesis. She tends to have mild constipation which she tells me is chronic and unchanged. She's had no change in urinary habits, specifically no hematuria or dysuria. She denies any peripheral swelling or orthopnea, and has had no chest pain or palpitations. She denies abnormal headaches or dizziness.  A detailed review of systems is otherwise stable and noncontributory.    PAST MEDICAL HISTORY: Past Medical History  Diagnosis Date  . ABNORMAL THYROID FUNCTION TESTS 12/10/2009  . ALCOHOLIC HEPATITIS, HX OF 02/14/2007  . ALLERGIC RHINITIS 02/14/2007  . ANXIETY 03/19/2007  . Breast implant removal status 12/23/2008  . COLONIC POLYPS, HX OF 02/14/2007  . GOITER, MULTINODULAR 02/14/2007  . HX, PERSONAL, ALCOHOLISM 02/14/2007  . HYPERLIPIDEMIA 02/14/2007  . HYPERTENSION 02/14/2007  . OSTEOARTHRITIS 02/14/2007  . OSTEOPOROSIS 02/14/2007  . PANCREATITIS, HX OF 02/14/2007  . Anemia, unspecified 04/26/2012  . Breast cancer   . Insomnia   . Smoker 09/06/2013    PAST SURGICAL HISTORY: Past Surgical History  Procedure Laterality Date  . Cataract extraction    . S/p jaw and periodontal surgury  2009  . Peridontal  2010  . Breast surgery  7591    silicone prepectoral implants explanted by Dr. Towanda Malkin after rupture  . Breast surgery      left simple mastectomy and left axillary sentinel node biopsy, right prophylactic mastectomy    FAMILY HISTORY Family History  Problem Relation Age of Onset  . Adopted: Yes    GYNECOLOGIC HISTORY:  (Reviewed 10/19/2013) She is G3, P3 and she was on hormone replacement therapy for 15 years but discontinued in 2007.  Menarche at age 38.  SOCIAL HISTORY:  (Reviewed 10/19/2013) She has been divorced for 30 years after being married twice.  Currently lives alone.  [place] 51[place]  She has 3 children, two in [place] and one in [place].  She has 2 grandchildren, a grandson who is attending [employer]. infrequently comes for supper, and a granddaughter going to [employer], who frequently comes for lunch.   ADVANCED DIRECTIVES: Not on file  HEALTH MAINTENANCE:  (Updated 10/19/2013) History  Substance Use Topics  . Smoking status: Current Every Day Smoker -- 0.50 packs/day    Types: Cigarettes  . Smokeless tobacco: Never Used  . Alcohol Use: 0.0 oz/week     Comment: one glass/wine occasional    Colonoscopy: The patient's last colonoscopy we have on record was in 2006 PAP:  04/25/2008 Bone density:  Last bone density scan was on 03/04/2011 which showed osteoporosis with T score of -2.9 Lipid panel: 03/08/2013/Tammy Wolfe   Allergies  Allergen Reactions  . Tetracycline Anaphylaxis    Current Outpatient Prescriptions  Medication Sig Dispense Refill  . ALPRAZolam (XANAX) 0.25 MG tablet TAKE  ONE TABLET THREE TIMES DAILY AS NEEDED  90 tablet  5  . Cholecalciferol (VITAMIN D) 1000 UNITS capsule Take 1,000 Units by mouth daily.        Marland Kitchen HYDROcodone-acetaminophen (NORCO/VICODIN) 5-325 MG per tablet Take 1 tablet by mouth 2 (two) times daily as needed.  100 tablet  0  . losartan (COZAAR) 50 MG tablet Take 1 tablet (50 mg total) by mouth daily.  90 tablet  3  . lovastatin (MEVACOR) 40 MG tablet Take 1 tablet (40 mg total) by mouth daily.  90 tablet  3  . tamoxifen (NOLVADEX) 20 MG tablet TAKE ONE TABLET BY MOUTH ONCE DAILY  90 tablet  0  . cyanocobalamin ([facility]S VITAMIN B12) 1000 MCG tablet Take 0.5 tablets (500 mcg total) by mouth daily.  30 tablet  4   No current facility-administered medications for this visit.    OBJECTIVE: Older a white woman who appears anxious but is in no acute distress Filed Vitals:   10/19/13 1102  BP: 114/73  Pulse: 106  Temp: 98.4 F (36.9 C)  Resp: 20     Body mass index is 17.58 kg/(m^2).     ECOG FS:  0 Filed Weights   10/19/13 1102  Weight: 93 lb (42.185 kg)   Physical Exam: HEENT:  Sclerae anicteric.  Oropharynx clear and moist. Neck supple, trachea midline.  NODES:  No cervical or supraclavicular lymphadenopathy palpated.  BREAST EXAM:  Patient status post bilateral mastectomies with no reconstruction. There no suspicious nodularities or skin changes, no evidence of local recurrence in the chest wall. Axillae are benign bilaterally, no palpable lymphadenopathy. LUNGS: Slightly diminished breath sounds bilaterally, more so on the right than the left. No significant dullness to percussion. No crackles or wheezes auscultated.  HEART:  Regular rate and rhythm. No murmur  ABDOMEN:  Soft, thin, nontender. No organomegaly or masses palpated. Positive bowel sounds.  MSK:  No focal spinal tenderness to palpation. Good range of motion bilaterally in the upper extremities. EXTREMITIES:  No peripheral edema.  No lymphedema in the left upper extremity. SKIN:  No visible rashes. No excessive ecchymoses. No petechiae. No pallor. NEURO:  Nonfocal. Well oriented.  Appropriate affect.     LAB RESULTS: Lab Results  Component Value Date   WBC 9.0 10/19/2013   NEUTROABS 6.8* 10/19/2013   HGB 11.0* 10/19/2013   HCT 34.3* 10/19/2013   MCV 86.8 10/19/2013   PLT 279 10/19/2013      Chemistry      Component Value Date/Time   NA 141 10/19/2013 1053   NA 142 03/08/2013 0908   K 3.3* 10/19/2013 1053   K 3.8 03/08/2013 0908   CL 116* 03/08/2013 0908   CL 111* 03/19/2012 1019   CO2 17* 10/19/2013 1053   CO2 20 03/08/2013 0908   BUN 14.7 10/19/2013 1053   BUN 20 03/08/2013 0908   CREATININE 0.9 10/19/2013 1053   CREATININE 0.9 03/08/2013 0908      Component Value Date/Time   CALCIUM 8.7 10/19/2013 1053   CALCIUM 8.6 03/08/2013 0908   ALKPHOS 53 10/19/2013 1053   ALKPHOS 41 03/08/2013 0908   AST 20 10/19/2013 1053   AST 18 03/08/2013 0908   ALT 12 10/19/2013 1053   ALT 11 03/08/2013 0908    BILITOT 0.38 10/19/2013 1053   BILITOT 0.4 03/08/2013 0908      STUDIES:  No results found.     ASSESSMENT: 73 y.o. [place] woman: 1.  Status post left breast  needle core biopsy at the 2:00 position on 11/14/2010 which showed invasive ductal carcinoma with calcification and lymphovascular invasion, grade 2, ER 100%, PR 100%, Ki-67 23%, HER-2/neu by CISH no amplification.  2.  Status post bilateral breast MRI on 11/18/2010 which noted a history of bilateral silicone breast implants removed in 2010 due to implant rupture.  The MRI also showed a left breast 1.2 cm mass at the 2:30 position, and a 1.1 cm intramammary lymph node on the right breast.    3.  Status post right breast needle core biopsy on 11/20/2010 which showed no evidence of malignancy.  4.  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 The patient started antiestrogen therapy with Tamoxifen in 11/2010, the plan being to continue for total of 5 years (until July 2017)  6.  Osteoporosis with T-2.9 score 03/04/2011 (at [facility])  7.  Tobacco abuse   8.  Lower back pain, right sided   PLAN: With regards to her breast cancer, Tammy Wolfe actually appears to be doing well. She is tolerating the tamoxifen well, and I have refilled that for her for another year. Her labs also showed her to be slightly hypokalemic with a potassium of 3.3, I am starting her on a low dose potassium supplement, 10 mEq twice daily.   We are sending her for an x-ray of the lumbar spine for further evaluation of her lower back pain. I also encouraged her to schedule her chest CT as soon as possible as ordered by Tammy Wolfe. She will continue to be followed by Tammy Wolfe regular basis as well. We were planning on waiting one year to see Tammy Wolfe  again, but with the issues we are addressing today, we will see her one more time in 6 months, then initiate annual visits.   We again discussed smoking cessation which she is not quite ready to do at this point although she tells me she is "trying to decrease".  The above was reviewed in detail with Tammy Wolfe today. She voices her understanding and agreement with our plan and will call with any changes or problems prior to her next appointment.   Tammy Flesher, PA-C  10/19/2013, 12:00 PM

## 2013-10-19 NOTE — Telephone Encounter (Signed)
per pof top sch pt appt-adv pt to go to RAD for XRAY @[facility]  after this appt-pt undewrstood-gave pt copy of sch

## 2013-10-31 ENCOUNTER — Encounter: Payer: Self-pay | Admitting: Internal Medicine

## 2013-10-31 ENCOUNTER — Ambulatory Visit (INDEPENDENT_AMBULATORY_CARE_PROVIDER_SITE_OTHER)
Admission: RE | Admit: 2013-10-31 | Discharge: 2013-10-31 | Disposition: A | Discharge status: Home / Self Care | Payer: Medicare Other | Source: Ambulatory Visit | Attending: Internal Medicine | Admitting: Internal Medicine

## 2013-10-31 DIAGNOSIS — R9389 Abnormal findings on diagnostic imaging of other specified body structures: Secondary | ICD-10-CM

## 2013-10-31 DIAGNOSIS — F17200 Nicotine dependence, unspecified, uncomplicated: Secondary | ICD-10-CM

## 2013-10-31 DIAGNOSIS — Z122 Encounter for screening for malignant neoplasm of respiratory organs: Secondary | ICD-10-CM | POA: Diagnosis not present

## 2013-10-31 DIAGNOSIS — I2510 Atherosclerotic heart disease of native coronary artery without angina pectoris: Secondary | ICD-10-CM | POA: Insufficient documentation

## 2013-10-31 DIAGNOSIS — R918 Other nonspecific abnormal finding of lung field: Secondary | ICD-10-CM

## 2013-10-31 HISTORY — DX: Atherosclerotic heart disease of native coronary artery without angina pectoris: I25.10

## 2013-11-02 ENCOUNTER — Encounter (HOSPITAL_COMMUNITY): Payer: Self-pay | Admitting: Emergency Medicine

## 2013-11-02 ENCOUNTER — Emergency Department (HOSPITAL_COMMUNITY)
Admission: EM | Admit: 2013-11-02 | Discharge: 2013-11-02 | Discharge status: Against Medical Advice | Payer: Medicare Other | Attending: Emergency Medicine | Admitting: Emergency Medicine

## 2013-11-02 DIAGNOSIS — I2510 Atherosclerotic heart disease of native coronary artery without angina pectoris: Secondary | ICD-10-CM | POA: Insufficient documentation

## 2013-11-02 DIAGNOSIS — F17200 Nicotine dependence, unspecified, uncomplicated: Secondary | ICD-10-CM | POA: Insufficient documentation

## 2013-11-02 DIAGNOSIS — R6889 Other general symptoms and signs: Secondary | ICD-10-CM | POA: Diagnosis not present

## 2013-11-02 DIAGNOSIS — I10 Essential (primary) hypertension: Secondary | ICD-10-CM | POA: Diagnosis not present

## 2013-11-02 NOTE — ED Notes (Signed)
The patient was at [facility] and she choked on a piece of steak.  The patient denies problems breathing, but cannot clear her throat .  The patient said she has passed the peice of steak while she is sitting here in triage.  The patient's oxygen levels are 98% on room air, and she is not nauseous anymore.  The patient is feeling comfortable and not feeling like there is something stuck in her throat. Her vital are within normal limits and charted.

## 2013-11-08 DIAGNOSIS — H3500 Unspecified background retinopathy: Secondary | ICD-10-CM | POA: Diagnosis not present

## 2013-11-08 DIAGNOSIS — H52209 Unspecified astigmatism, unspecified eye: Secondary | ICD-10-CM | POA: Diagnosis not present

## 2013-11-08 DIAGNOSIS — Z961 Presence of intraocular lens: Secondary | ICD-10-CM | POA: Diagnosis not present

## 2014-01-24 ENCOUNTER — Other Ambulatory Visit: Payer: Self-pay | Admitting: Internal Medicine

## 2014-01-24 NOTE — Telephone Encounter (Signed)
Done hardcopy to robin  

## 2014-01-24 NOTE — Telephone Encounter (Signed)
Faxed hardcopy to [facility] [place]  

## 2014-02-15 ENCOUNTER — Telehealth: Payer: Self-pay | Admitting: Internal Medicine

## 2014-02-15 ENCOUNTER — Other Ambulatory Visit (INDEPENDENT_AMBULATORY_CARE_PROVIDER_SITE_OTHER): Payer: Medicare Other

## 2014-02-15 DIAGNOSIS — R300 Dysuria: Secondary | ICD-10-CM

## 2014-02-15 LAB — URINALYSIS, ROUTINE W REFLEX MICROSCOPIC
Bilirubin Urine: NEGATIVE
Ketones, ur: NEGATIVE
Nitrite: NEGATIVE
Specific Gravity, Urine: <=1.005 — AB (ref 1.000–1.030)
Total Protein, Urine: NEGATIVE
UROBILINOGEN UA: 0.2 (ref 0.0–1.0)
Urine Glucose: NEGATIVE
pH: 6.5 (ref 5.0–8.0)

## 2014-02-15 MED ORDER — CEPHALEXIN 500 MG PO CAPS: 500.0000 mg | ORAL_CAPSULE | Freq: Four times a day (QID) | ORAL | Status: DC

## 2014-02-15 NOTE — Telephone Encounter (Signed)
Antibiotic sent to pharmacy - see lab note

## 2014-02-15 NOTE — Telephone Encounter (Signed)
Patient called back in regards.  °

## 2014-02-15 NOTE — Telephone Encounter (Signed)
Ok this time labs have been entered

## 2014-02-15 NOTE — Telephone Encounter (Signed)
Called the patient informed lab entered and to come in to the lab at her convenience have urine checked and that we would call her with the results and PCP instructions.

## 2014-02-15 NOTE — Telephone Encounter (Signed)
Called the patient informed antibiotic sent in to the pharmacy

## 2014-02-15 NOTE — Telephone Encounter (Signed)
Patient thinks she has a UTI.  She would like to know if she could just go to the lab.  Please advise.

## 2014-02-17 LAB — URINE CULTURE: Colony Count: 50000

## 2014-03-07 ENCOUNTER — Encounter: Payer: Self-pay | Admitting: Internal Medicine

## 2014-03-07 ENCOUNTER — Ambulatory Visit (INDEPENDENT_AMBULATORY_CARE_PROVIDER_SITE_OTHER): Payer: Medicare Other | Admitting: Internal Medicine

## 2014-03-07 VITALS — BP 118/78 | HR 109 | Temp 97.7°F | Wt 104.2 lb

## 2014-03-07 DIAGNOSIS — J449 Chronic obstructive pulmonary disease, unspecified: Secondary | ICD-10-CM

## 2014-03-07 DIAGNOSIS — F411 Generalized anxiety disorder: Secondary | ICD-10-CM

## 2014-03-07 DIAGNOSIS — Z23 Encounter for immunization: Secondary | ICD-10-CM

## 2014-03-07 DIAGNOSIS — J4489 Other specified chronic obstructive pulmonary disease: Secondary | ICD-10-CM | POA: Insufficient documentation

## 2014-03-07 DIAGNOSIS — M549 Dorsalgia, unspecified: Secondary | ICD-10-CM | POA: Diagnosis not present

## 2014-03-07 DIAGNOSIS — J3089 Other allergic rhinitis: Secondary | ICD-10-CM

## 2014-03-07 DIAGNOSIS — G8929 Other chronic pain: Secondary | ICD-10-CM

## 2014-03-07 MED ORDER — TIOTROPIUM BROMIDE MONOHYDRATE 18 MCG IN CAPS: 18.0000 ug | ORAL_CAPSULE | Freq: Every day | RESPIRATORY_TRACT | Status: DC

## 2014-03-07 MED ORDER — HYDROCODONE-ACETAMINOPHEN 5-325 MG PO TABS: 1.0000 | ORAL_TABLET | Freq: Two times a day (BID) | ORAL | Status: DC | PRN

## 2014-03-07 MED ORDER — ALBUTEROL SULFATE HFA 108 (90 BASE) MCG/ACT IN AERS: 2.0000 | INHALATION_SPRAY | Freq: Four times a day (QID) | RESPIRATORY_TRACT | Status: DC | PRN

## 2014-03-07 NOTE — Assessment & Plan Note (Signed)
stable overall by history and exam, recent data reviewed with pt, and pt to continue medical treatment as before,  to f/u any worsening symptoms or concerns, for med refill 

## 2014-03-07 NOTE — Patient Instructions (Addendum)
You had the flu shot today  OK to take OTC Claritin for allergies  Please take all new medication as prescribed - the spiriva daily, but also the Proair [facility] inhaler as needed for shortness of breath  Please continue to not smoke  Please call if you change your mind about starting a medication such as Zoloft or Lexapro (similar to paxil)  You will be due for your next CT Chest in June 2016  Please continue all other medications as before, and refills have been done if requested - the hydrocodone  Please have the pharmacy call with any other refills you may need.  Please continue your efforts at being more active, low cholesterol diet, and weight control.  You are otherwise up to date with prevention measures today.  Please keep your appointments with your specialists as you may have planned  Please return in 6 months, or sooner if needed

## 2014-03-07 NOTE — Assessment & Plan Note (Signed)
Some improved with quitting smoking, for spiriva trial/proair prn,  to f/u any worsening symptoms or concerns

## 2014-03-07 NOTE — Progress Notes (Signed)
Pre visit review using our clinic review tool, if applicable. No additional management support is needed unless otherwise documented below in the visit note. 

## 2014-03-07 NOTE — Assessment & Plan Note (Signed)
Also for otc claritin prn,  to f/u any worsening symptoms or concerns

## 2014-03-07 NOTE — Assessment & Plan Note (Signed)
Chronic, some increased with quitting smoking, delcines paxil or other ssri trial, cont xanax prn

## 2014-03-07 NOTE — Progress Notes (Signed)
Subjective:    Patient ID: Tammy Wolfe, female    DOB: 08-21-1940, 73 y.o.   MRN: [id number]  HPI  Here to f/u; overall doing ok,  Pt denies chest pain, increased sob or doe, wheezing, orthopnea, PND, increased LE swelling, palpitations, dizziness or syncope, except for increased sob/doe  ? Anxiety or copd related or other.  CT chest June 2015 wihtout acute, but + copd.   Pt denies fever, wt loss, night sweats, loss of appetite, or other constitutional symptoms, in fact has gained several lbs since quit smoking x 3 wks.  Pt denies polydipsia, polyuria, or low sugar symptoms such as weakness or confusion improved with po intake.  Pt denies new neurological symptoms such as new headache, or facial or extremity weakness or numbness.   Pt states overall good compliance with meds, has been trying to follow lower cholesterol ndiet, with wt overall stable,  but little exercise however - plans to try to do more Did have UTi recent, now resolved.  Did quit smoking  X 3 wks.  Anxiety is overall increased but Denies worsening depressive symptoms, suicidal ideation, but has some mini panic attacks.  Mother alive at 75 (pt adopted).  Pt continues to have recurring LBP without change in severity, bowel or bladder change, fever, wt loss,  worsening LE pain/numbness/weakness, gait change or falls  Controlled with hydrocodone about 3 times per wk. Does have several wks ongoing nasal allergy symptoms with clearish congestion, itch and sneezing, without fever, pain, ST, cough, swelling or wheezing.   Past Medical History  Diagnosis Date  . ABNORMAL THYROID FUNCTION TESTS 12/10/2009  . ALCOHOLIC HEPATITIS, HX OF 02/14/2007  . ALLERGIC RHINITIS 02/14/2007  . ANXIETY 03/19/2007  . Breast implant removal status 12/23/2008  . COLONIC POLYPS, HX OF 02/14/2007  . GOITER, MULTINODULAR 02/14/2007  . HX, PERSONAL, ALCOHOLISM 02/14/2007  . HYPERLIPIDEMIA 02/14/2007  . HYPERTENSION 02/14/2007  . OSTEOARTHRITIS 02/14/2007  .  OSTEOPOROSIS 02/14/2007  . PANCREATITIS, HX OF 02/14/2007  . Anemia, unspecified 04/26/2012  . Breast cancer   . Insomnia   . Smoker 09/06/2013  . Coronary artery calcification seen on CT scan 10/31/2013   Past Surgical History  Procedure Laterality Date  . Cataract extraction    . S/p jaw and periodontal surgury  2009  . Peridontal  2010  . Breast surgery  [id number]    silicone prepectoral implants explanted by Dr. Towanda Malkin after rupture  . Breast surgery      left simple mastectomy and left axillary sentinel node biopsy, right prophylactic mastectomy    reports that she has been smoking Cigarettes.  She has been smoking about 0.50 packs per day. She has never used smokeless tobacco. She reports that she drinks alcohol. She reports that she does not use illicit drugs. family history is not on file. She was adopted. Allergies  Allergen Reactions  . Tetracycline Anaphylaxis   Current Outpatient Prescriptions on File Prior to Visit  Medication Sig Dispense Refill  . ALPRAZolam (XANAX) 0.25 MG tablet TAKE ONE TABLET THREE TIMES DAILY AS NEEDED  90 tablet  5  . Cholecalciferol (VITAMIN D) 1000 UNITS capsule Take 1,000 Units by mouth daily.        . cyanocobalamin ([employer]S VITAMIN B12) 1000 MCG tablet Take 0.5 tablets (500 mcg total) by mouth daily.  30 tablet  4  . HYDROcodone-acetaminophen (NORCO/VICODIN) 5-325 MG per tablet Take 1 tablet by mouth 2 (two) times daily as needed.  100 tablet  0  .  losartan (COZAAR) 50 MG tablet Take 1 tablet (50 mg total) by mouth daily.  90 tablet  3  . lovastatin (MEVACOR) 40 MG tablet Take 1 tablet (40 mg total) by mouth daily.  90 tablet  3  . potassium chloride (MICRO-K) 10 MEQ CR capsule Take 1 capsule (10 mEq total) by mouth 2 (two) times daily.  60 capsule  5  . tamoxifen (NOLVADEX) 20 MG tablet TAKE ONE TABLET BY MOUTH ONCE DAILY  90 tablet  3   No current facility-administered medications on file prior to visit.     Review of Systems  Constitutional:  Negative for unusual diaphoresis or other sweats  HENT: Negative for ringing in ear Eyes: Negative for double vision or worsening visual disturbance.  Respiratory: Negative for choking and stridor.   Gastrointestinal: Negative for vomiting or other signifcant bowel change Genitourinary: Negative for hematuria or decreased urine volume.  Musculoskeletal: Negative for other MSK pain or swelling Skin: Negative for color change and worsening wound.  Neurological: Negative for tremors and numbness other than noted  Psychiatric/Behavioral: Negative for decreased concentration or agitation other than above       Objective:   Physical Exam BP 118/78  Pulse 109  Temp(Src) 97.7 F (36.5 C) (Oral)  Wt 104 lb 4 oz (47.287 kg)  SpO2 95% VS noted,  Constitutional: Pt appears well-developed, well-nourished.  HENT: Head: NCAT.  Right Ear: External ear normal.  Left Ear: External ear normal.  Bilat tm's with mild erythema.  Max sinus areas non tender.  Pharynx with mild erythema, no exudate Eyes: . Pupils are equal, round, and reactive to light. Conjunctivae and EOM are normal Neck: Normal range of motion. Neck supple.  Cardiovascular: Normal rate and regular rhythm.   Pulmonary/Chest: Effort normal and breath sounds decreased, no rales or wheezing.  S/p bilat mastectomy Neurological: Pt is alert. Not confused , motor grossly intact Skin: Skin is warm. No rash Psychiatric: Pt behavior is normal. No agitation. 1+ nervous    Assessment & Plan:

## 2014-03-08 ENCOUNTER — Telehealth: Payer: Self-pay | Admitting: Internal Medicine

## 2014-03-08 NOTE — Telephone Encounter (Signed)
EMMI EMAILED  °

## 2014-04-17 ENCOUNTER — Other Ambulatory Visit (HOSPITAL_BASED_OUTPATIENT_CLINIC_OR_DEPARTMENT_OTHER): Payer: Medicare Other

## 2014-04-17 DIAGNOSIS — C50419 Malignant neoplasm of upper-outer quadrant of unspecified female breast: Secondary | ICD-10-CM

## 2014-04-17 DIAGNOSIS — C50412 Malignant neoplasm of upper-outer quadrant of left female breast: Secondary | ICD-10-CM

## 2014-04-17 LAB — CBC WITH DIFFERENTIAL/PLATELET
BASO%: 0.1 % (ref 0.0–2.0)
BASOS ABS: 0 10*3/uL (ref 0.0–0.1)
EOS ABS: 0.2 10*3/uL (ref 0.0–0.5)
EOS%: 2.2 % (ref 0.0–7.0)
HCT: 34.2 % — ABNORMAL LOW (ref 34.8–46.6)
HEMOGLOBIN: 10.8 g/dL — AB (ref 11.6–15.9)
LYMPH%: 18 % (ref 14.0–49.7)
MCH: 29.3 pg (ref 25.1–34.0)
MCHC: 31.6 g/dL (ref 31.5–36.0)
MCV: 92.7 fL (ref 79.5–101.0)
MONO#: 0.5 10*3/uL (ref 0.1–0.9)
MONO%: 7.3 % (ref 0.0–14.0)
NEUT%: 72.4 % (ref 38.4–76.8)
NEUTROS ABS: 5.4 10*3/uL (ref 1.5–6.5)
Platelets: 258 10*3/uL (ref 145–400)
RBC: 3.69 10*6/uL — ABNORMAL LOW (ref 3.70–5.45)
RDW: 15.2 % — ABNORMAL HIGH (ref 11.2–14.5)
WBC: 7.4 10*3/uL (ref 3.9–10.3)
lymph#: 1.3 10*3/uL (ref 0.9–3.3)

## 2014-04-17 LAB — COMPREHENSIVE METABOLIC PANEL (CC13)
ALK PHOS: 92 U/L (ref 40–150)
ALT: 17 U/L (ref 0–55)
AST: 36 U/L — ABNORMAL HIGH (ref 5–34)
Albumin: 3.7 g/dL (ref 3.5–5.0)
Anion Gap: 11 mEq/L (ref 3–11)
BILIRUBIN TOTAL: 0.56 mg/dL (ref 0.20–1.20)
BUN: 15.8 mg/dL (ref 7.0–26.0)
CO2: 18 mEq/L — ABNORMAL LOW (ref 22–29)
Calcium: 9.4 mg/dL (ref 8.4–10.4)
Chloride: 113 mEq/L — ABNORMAL HIGH (ref 98–109)
Creatinine: 1.1 mg/dL (ref 0.6–1.1)
GLUCOSE: 137 mg/dL (ref 70–140)
Potassium: 3.9 mEq/L (ref 3.5–5.1)
Sodium: 142 mEq/L (ref 136–145)
Total Protein: 7.4 g/dL (ref 6.4–8.3)

## 2014-04-24 ENCOUNTER — Telehealth: Payer: Self-pay | Admitting: Nurse Practitioner

## 2014-04-24 ENCOUNTER — Ambulatory Visit (HOSPITAL_BASED_OUTPATIENT_CLINIC_OR_DEPARTMENT_OTHER): Payer: Medicare Other | Admitting: Oncology

## 2014-04-24 VITALS — BP 142/74 | HR 97 | Temp 97.6°F | Resp 18 | Ht 60.0 in | Wt 102.1 lb

## 2014-04-24 DIAGNOSIS — C50412 Malignant neoplasm of upper-outer quadrant of left female breast: Secondary | ICD-10-CM | POA: Diagnosis not present

## 2014-04-24 DIAGNOSIS — F419 Anxiety disorder, unspecified: Secondary | ICD-10-CM | POA: Diagnosis not present

## 2014-04-24 DIAGNOSIS — M810 Age-related osteoporosis without current pathological fracture: Secondary | ICD-10-CM

## 2014-04-24 DIAGNOSIS — Z170 Estrogen receptor positive status [ER+]: Secondary | ICD-10-CM | POA: Diagnosis not present

## 2014-04-24 NOTE — Telephone Encounter (Signed)
, °

## 2014-04-24 NOTE — Progress Notes (Signed)
[place]  [phone] [phone]  OFFICE PROGRESS NOTE   ID: Tammy Wolfe   DOB: 10-25-1940  MR#: [id number]  ION#:[id number]   PCP: Cathlean Cower, MD SURGEON:  Rolm Bookbinder, MD  CHIEF COMPLAINT:  Hx of Left Breast Cancer  CURRENT TREATMENT: Tamoxifen   HISTORY OF PRESENT ILLNESS: From Dr. Eston Esters is New Patient Evaluation Note dated 12/20/2010:  "This is a very pleasant 73 year old woman who is here with her daughter, Tammy Wolfe, to discuss her recently diagnosed breast cancer. This woman has been in good health.  She is a [place].  She has undergone annual screening mammography.  She had a screening mammogram on 11/12/2010 which showed the presence of bilateral silicone implants as well as numerous dense hyperdense material.  BSGI was recommended because of this.  A BSGI was performed which showed a 1.4 cm focus of moderate intense activity upper outer quadrant at the 10 o'clock position on the left.  There was a 1.6 cm focus of isotope activity at the 3 o'clock position.  Bilateral ultrasounds were used to evaluate these areas.  An ultrasound did suggest the presence of a mass at the 11 o'clock position in the right breast.  Both these areas were biopsied on 11/20/2010 and pathology did show an unremarkable biopsy on the right; on the left there was an invasive ductal cancer grade 2-3 with LVI noted.  This was felt to be a grade 2-3 lesion, ER positive 100%, PR positive 98%, proliferative index 23%, HER-2 was not amplified with a ratio of 1.48.  The patient did undergo an MRI scan of both breasts.  Initially had a 1.2 x 1.2 x 0.7 cm mass left breast consistent with invasive ductal cancer; on the right side there was a 1.1 x 0.8 x 0.7 cm mass.  The patient elected to undergo bilateral mastectomies which she did on 12/10/2010.  On the left side there was invasive ductal cancer measuring 1.4 cm and was grade 1 of 3 with two negative sentinel lymph  nodes.  On the right side there was a benign lymph node and no evidence of malignancy.  Surgical biopsies were all clear.  Once again the prognostic panel suggested a low proliferative index and non-amplified ratio of 1.48.  The patient has had an unremarkable postoperative course."    Her subsequent history is as detailed below.  INTERVAL HISTORY: Tammy Wolfe returns today for follow-up of her breast cancer. She continues on tamoxifen and doesn't have significant problems from it. In particular hot flashes and vaginal wetness are not a major issue.   REVIEW OF SYSTEMS: Tammy Wolfe feels very anxious. Her 12 year old (adoptive) mother is at friends home, severely demented, and being very well cared for, but at the cost of $8000 a month. This is eating into the families trust and she is appropriately concerned as to what to do when fond run out. There are other family issues she discussed as well. Accordingly she has a great deal of trouble falling asleep. She is using alprazolam to help with that. Sometimes she has palpitations, which she feels are more like a panic attacks she is to have when she was in college. She has a little bit of a runny nose, mild dry cough, and of course the back and joint pain that she had previously. She walks around her neighborhood almost every day and does a little bit of gardening in her backyard as remain form of exercise. Otherwise a detailed review of  systems today was noncontributory.   PAST MEDICAL HISTORY: Past Medical History  Diagnosis Date  . ABNORMAL THYROID FUNCTION TESTS 12/10/2009  . ALCOHOLIC HEPATITIS, HX OF 02/14/2007  . ALLERGIC RHINITIS 02/14/2007  . ANXIETY 03/19/2007  . Breast implant removal status 12/23/2008  . COLONIC POLYPS, HX OF 02/14/2007  . GOITER, MULTINODULAR 02/14/2007  . HX, PERSONAL, ALCOHOLISM 02/14/2007  . HYPERLIPIDEMIA 02/14/2007  . HYPERTENSION 02/14/2007  . OSTEOARTHRITIS 02/14/2007  . OSTEOPOROSIS 02/14/2007  . PANCREATITIS, HX OF 02/14/2007   . Anemia, unspecified 04/26/2012  . Breast cancer   . Insomnia   . Smoker 09/06/2013  . Coronary artery calcification seen on CT scan 10/31/2013    PAST SURGICAL HISTORY: Past Surgical History  Procedure Laterality Date  . Cataract extraction    . S/p jaw and periodontal surgury  2009  . Peridontal  2010  . Breast surgery  [id number]    silicone prepectoral implants explanted by Dr. Towanda Malkin after rupture  . Breast surgery      left simple mastectomy and left axillary sentinel node biopsy, right prophylactic mastectomy    FAMILY HISTORY Family History  Problem Relation Age of Onset  . Adopted: Yes    GYNECOLOGIC HISTORY:  (Reviewed 10/19/2013) She is G3, P3 and she was on hormone replacement therapy for 15 years but discontinued in 2007.  Menarche at age 45.  SOCIAL HISTORY:  (Reviewed 10/19/2013) She has been divorced for 30 years after being married twice.  Currently lives alone.  [place] 78[place]  She has 3 children, two in [place] and one in [place].  She has 2 grandchildren, a grandson who is attending [employer]. infrequently comes for supper, and a granddaughter going to [employer], who frequently comes for lunch.   ADVANCED DIRECTIVES: In place HEALTH MAINTENANCE:  (Updated 10/19/2013) History  Substance Use Topics  . Smoking status: Current Every Day Smoker -- 0.50 packs/day    Types: Cigarettes  . Smokeless tobacco: Never Used  . Alcohol Use: 0.0 oz/week     Comment: one glass/wine occasional    Colonoscopy: The patient's last colonoscopy we have on record was in 2006 PAP:  04/25/2008 Bone density:  Last bone density scan was on 03/04/2011 which showed osteoporosis with T score of -2.9 Lipid panel: 03/08/2013/Dr. Jenny Reichmann   Allergies  Allergen Reactions  . Tetracycline Anaphylaxis    Current Outpatient Prescriptions  Medication Sig Dispense Refill  . albuterol (PROVENTIL HFA;VENTOLIN HFA) 108 (90 BASE) MCG/ACT  inhaler Inhale 2 puffs into the lungs every 6 (six) hours as needed for wheezing or shortness of breath. 18 g 11  . ALPRAZolam (XANAX) 0.25 MG tablet TAKE ONE TABLET THREE TIMES DAILY AS NEEDED 90 tablet 5  . Cholecalciferol (VITAMIN D) 1000 UNITS capsule Take 1,000 Units by mouth daily.      . cyanocobalamin ([employer]S VITAMIN B12) 1000 MCG tablet Take 0.5 tablets (500 mcg total) by mouth daily. 30 tablet 4  . HYDROcodone-acetaminophen (NORCO/VICODIN) 5-325 MG per tablet Take 1 tablet by mouth 2 (two) times daily as needed. 100 tablet 0  . losartan (COZAAR) 50 MG tablet Take 1 tablet (50 mg total) by mouth daily. 90 tablet 3  . lovastatin (MEVACOR) 40 MG tablet Take 1 tablet (40 mg total) by mouth daily. 90 tablet 3  . potassium chloride (MICRO-K) 10 MEQ CR capsule Take 1 capsule (10 mEq total) by mouth 2 (two) times daily. 60 capsule 5  . tamoxifen (NOLVADEX) 20 MG  tablet TAKE ONE TABLET BY MOUTH ONCE DAILY 90 tablet 3  . tiotropium (SPIRIVA HANDIHALER) 18 MCG inhalation capsule Place 1 capsule (18 mcg total) into inhaler and inhale daily. 30 capsule 12   No current facility-administered medications for this visit.    OBJECTIVE: Older a white woman in no physical distress  Filed Vitals:   04/24/14 1321  BP: 142/74  Pulse: 97  Temp: 97.6 F (36.4 C)  Resp: 18     Body mass index is 19.94 kg/(m^2).    ECOG FS:  0 Filed Weights   04/24/14 1321  Weight: 102 lb 1.6 oz (46.312 kg)   Sclerae unicteric, pupils equal and reactive Oropharynx clear and moist No cervical or supraclavicular adenopathy Lungs no rales or rhonchi Heart regular rate and rhythm Abd soft, nontender, positive bowel sounds MSK no focal spinal tenderness, no upper extremity lymphedema Neuro: nonfocal, well oriented, appropriate affect Breasts: Status post bilateral mastectomies. No evidence of chest wall recurrence. Both axillae are benign.  BASIC for you have a jobRESULTS: Lab Results  Component Value Date   WBC 7.4  04/17/2014   NEUTROABS 5.4 04/17/2014   HGB 10.8* 04/17/2014   HCT 34.2* 04/17/2014   MCV 92.7 04/17/2014   PLT 258 04/17/2014      Chemistry      Component Value Date/Time   NA 142 04/17/2014 0844   NA 142 03/08/2013 0908   K 3.9 04/17/2014 0844   K 3.8 03/08/2013 0908   CL 116* 03/08/2013 0908   CL 111* 03/19/2012 1019   CO2 18* 04/17/2014 0844   CO2 20 03/08/2013 0908   BUN 15.8 04/17/2014 0844   BUN 20 03/08/2013 0908   CREATININE 1.1 04/17/2014 0844   CREATININE 0.9 03/08/2013 0908      Component Value Date/Time   CALCIUM 9.4 04/17/2014 0844   CALCIUM 8.6 03/08/2013 0908   ALKPHOS 92 04/17/2014 0844   ALKPHOS 41 03/08/2013 0908   AST 36* 04/17/2014 0844   AST 18 03/08/2013 0908   ALT 17 04/17/2014 0844   ALT 11 03/08/2013 0908   BILITOT 0.56 04/17/2014 0844   BILITOT 0.4 03/08/2013 0908      STUDIES:  CLINICAL DATA: Low back pain; history of breast carcinoma  EXAM: LUMBAR SPINE - 2-3 VIEW  COMPARISON: May 06, 2012  FINDINGS: Frontal, lateral, and spot lumbosacral lateral images were obtained. There are 5 non-rib-bearing lumbar type vertebral bodies. There is lumbar levoscoliosis. There is no fracture. There is stable the 3 mm of anterolisthesis of L4 on L5. There is no other spondylolisthesis. There is mild disc space narrowing at L2-3, L3-4, and L4-5. There is osteoarthritic change in both hip joints. No erosive change. No blastic or lytic bone lesions.  IMPRESSION: Scoliosis. The 3 mm of anterolisthesis of L4 on L5, stable and probably due to underlying spondylosis. No fracture. This narrowing at multiple levels. There is osteoarthritic change in both hip joints.   Electronically Signed  By: Lowella Grip M.D.  On: 10/19/2013 14:36      ASSESSMENT: 74 y.o. [place] woman:  1.  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  2.  Status post bilateral breast MRI on 11/18/2010 which noted a history of bilateral silicone breast implants removed in 2010 due to implant rupture.  The MRI also showed a left breast  1.2 cm mass at the 2:30 position, and a 1.1 cm intramammary lymph node on the right breast.    3.  Status post right breast needle core biopsy on 11/20/2010 which showed no evidence of malignancy.  4.  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 The patient started antiestrogen therapy with Tamoxifen in 11/2010, the plan being to continue for total of 5 years (until July 2017)  6.  Osteoporosis with T-2.9 score 03/04/2011 (at [facility])    PLAN: Tammy Wolfe is doing fine from a breast cancer point of view, and specifically she is tolerating the tamoxifen with no significant side effects. The plan is to continue that for another year and a half or so. She'll see [place] again in July 2016 and then one last time in July 2017, at which point she will "graduate".  She is doing her best to manage her anxiety. She is leaning on alprazolam at bedtime. There is nothing wrong with that, of course, except that the more she uses that line of drugs the less well they will work. I suggested she consider trying Benadryl 25 mg at bedtime and see if that can at least make it possible for her to sleep some nights without taking the Xanax, so she can stretch out the Xanax to work a little bit longer.  Otherwise she knows to call for any problems that may develop before her next visit here.  Chauncey Cruel, MD   04/24/2014, 1:43 PM

## 2014-04-26 NOTE — Addendum Note (Signed)
Addended by: Laureen Abrahams on: 04/26/2014 07:06 PM   Modules accepted: Medications

## 2014-07-18 IMAGING — CR DG LUMBAR SPINE 2-3V
3 series · 3 of 3 positions shown · non-contrast
Comparison: May 06, 2012

CLINICAL DATA: Low back pain; history of breast carcinoma

EXAM:
LUMBAR SPINE - 2-3 VIEW

[t l-spine a.p.]
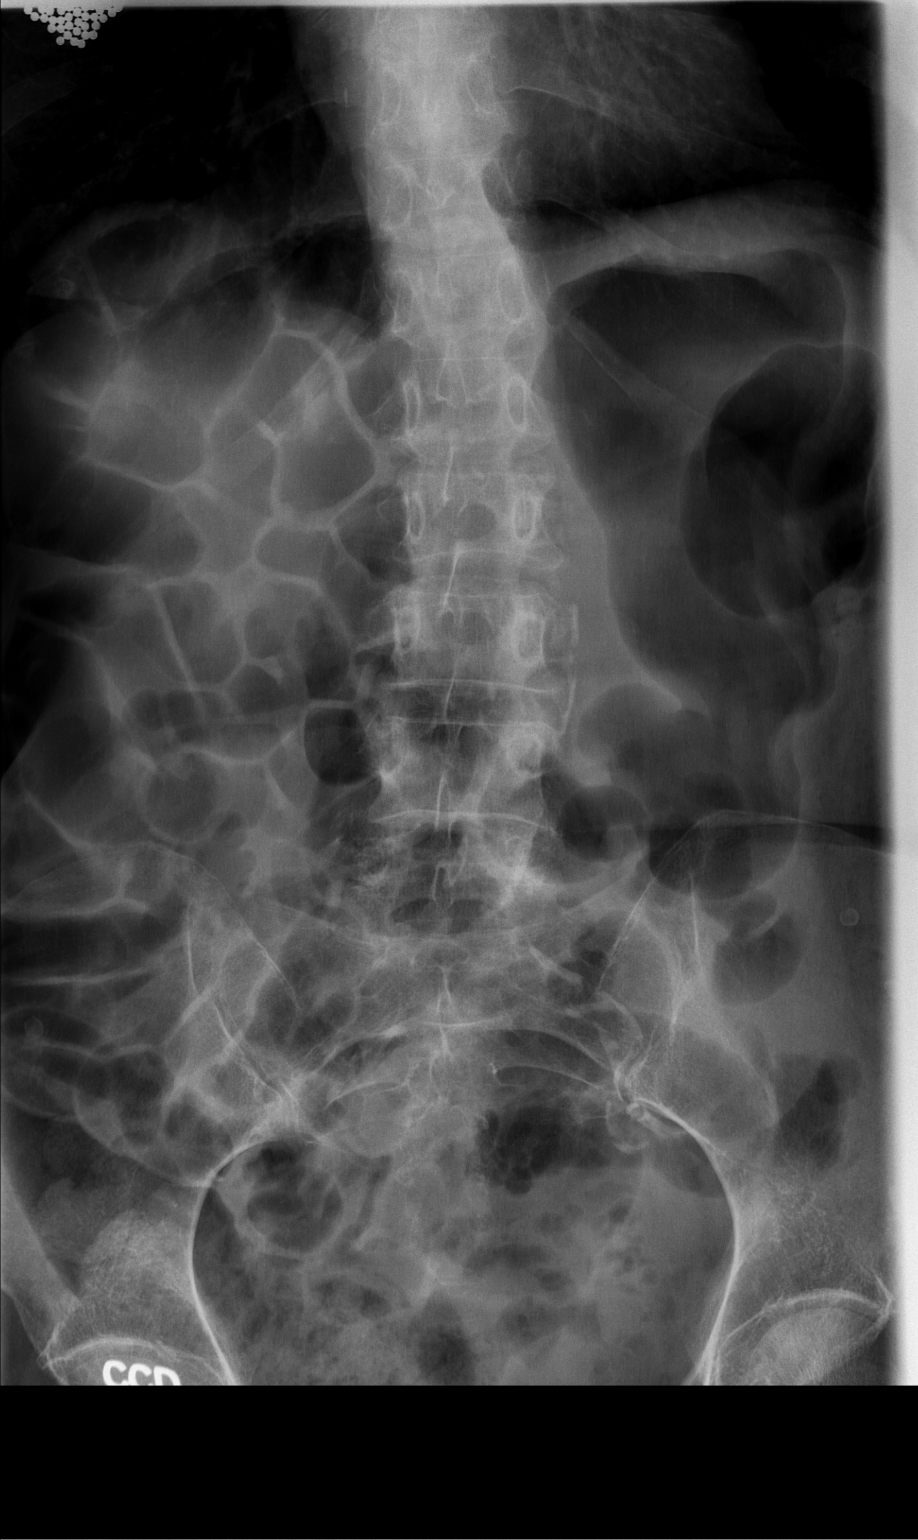

[t l-spine lat]
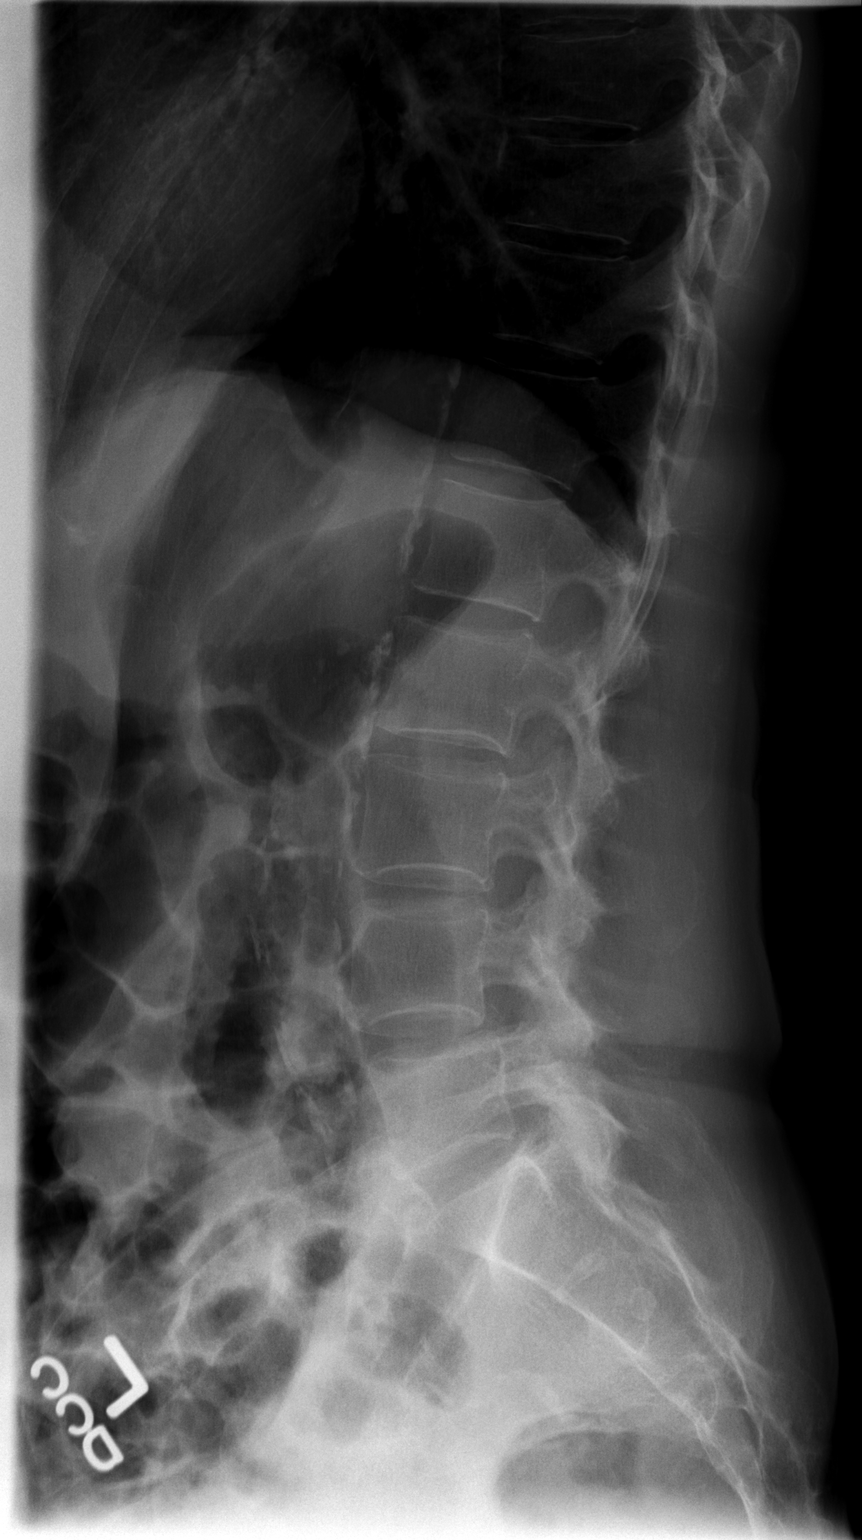

[t l-spine l5-s1 spot]
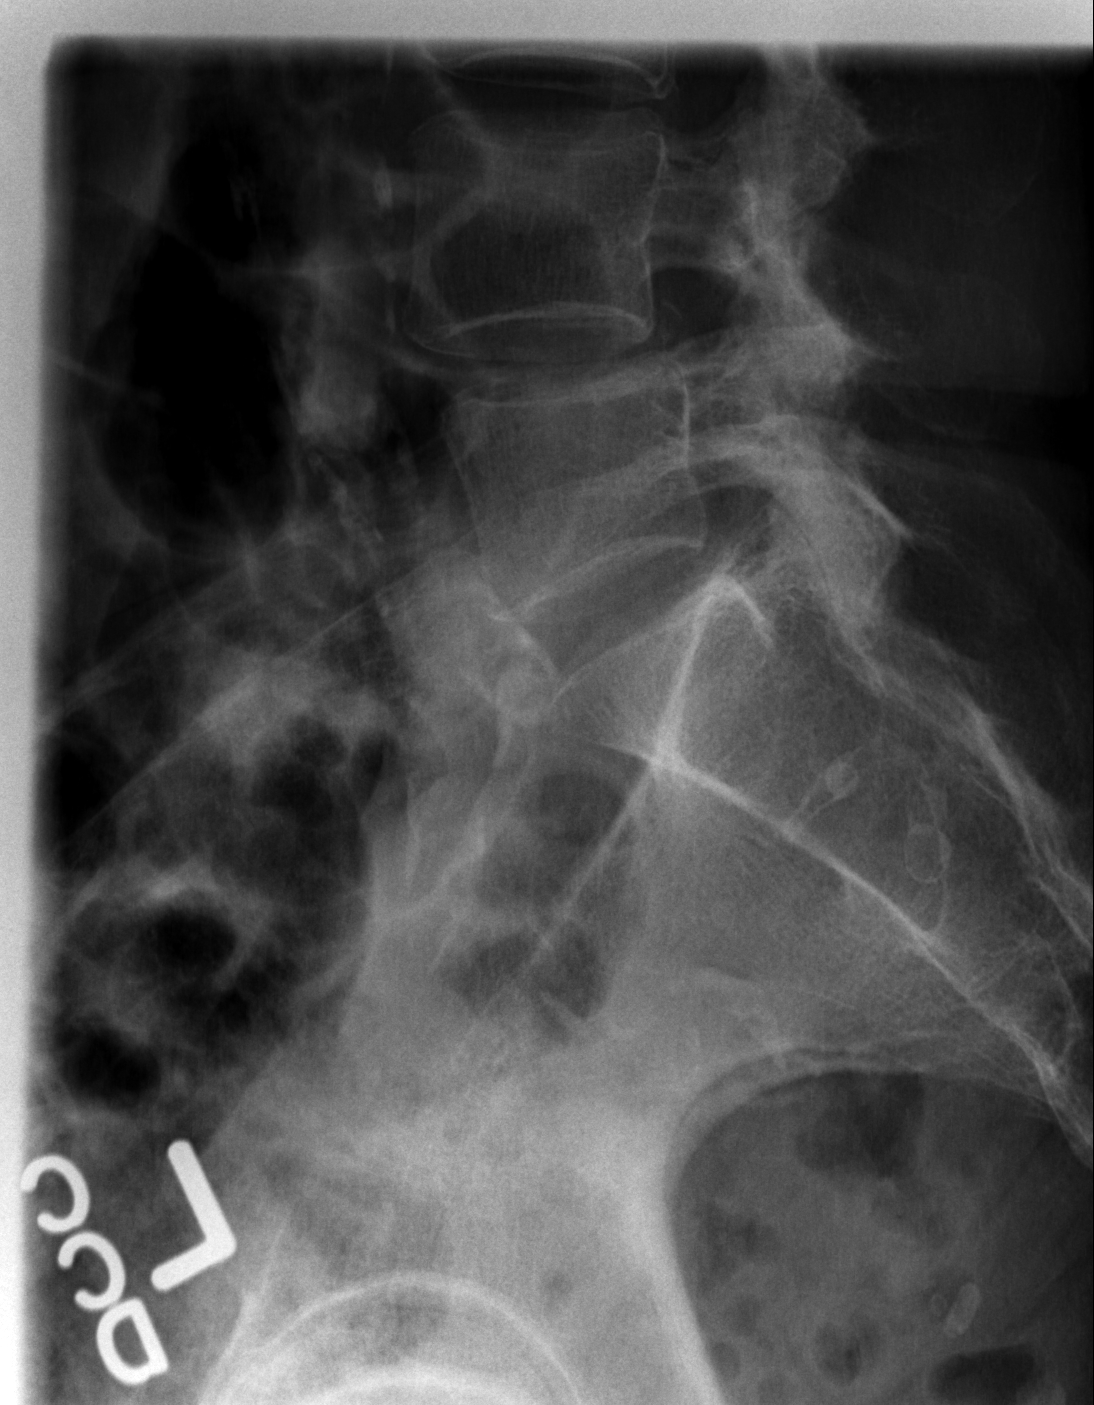

[3 of 3 positions shown; findings below may reference images not displayed]

FINDINGS: Frontal, lateral, and spot lumbosacral lateral images were obtained.
There are 5 non-rib-bearing lumbar type vertebral bodies. There is
lumbar levoscoliosis. There is no fracture. There is stable the 3 mm
of anterolisthesis of L4 on L5. There is no other spondylolisthesis.
There is mild disc space narrowing at L2-3, L3-4, and L4-5. There is
osteoarthritic change in both hip joints. No erosive change. No
blastic or lytic bone lesions.
IMPRESSION: Scoliosis. The 3 mm of anterolisthesis of L4 on L5, stable and
probably due to underlying spondylosis. No fracture. This narrowing
at multiple levels. There is osteoarthritic change in both hip
joints.

## 2014-07-19 ENCOUNTER — Other Ambulatory Visit: Payer: Self-pay | Admitting: Internal Medicine

## 2014-07-19 NOTE — Telephone Encounter (Signed)
Done hardcopy to Cherina  

## 2014-07-19 NOTE — Telephone Encounter (Signed)
Faxed hard copy to [employer].

## 2014-09-06 ENCOUNTER — Encounter: Payer: Self-pay | Admitting: Internal Medicine

## 2014-09-06 ENCOUNTER — Ambulatory Visit (INDEPENDENT_AMBULATORY_CARE_PROVIDER_SITE_OTHER): Payer: Medicare Other | Admitting: Internal Medicine

## 2014-09-06 VITALS — BP 130/86 | HR 59 | Temp 98.2°F | Resp 18 | Ht 60.0 in | Wt 90.0 lb

## 2014-09-06 DIAGNOSIS — J4489 Other specified chronic obstructive pulmonary disease: Secondary | ICD-10-CM

## 2014-09-06 DIAGNOSIS — R7302 Impaired glucose tolerance (oral): Secondary | ICD-10-CM | POA: Diagnosis not present

## 2014-09-06 DIAGNOSIS — J449 Chronic obstructive pulmonary disease, unspecified: Secondary | ICD-10-CM

## 2014-09-06 DIAGNOSIS — I10 Essential (primary) hypertension: Secondary | ICD-10-CM | POA: Diagnosis not present

## 2014-09-06 DIAGNOSIS — E785 Hyperlipidemia, unspecified: Secondary | ICD-10-CM

## 2014-09-06 DIAGNOSIS — C50412 Malignant neoplasm of upper-outer quadrant of left female breast: Secondary | ICD-10-CM

## 2014-09-06 DIAGNOSIS — E876 Hypokalemia: Secondary | ICD-10-CM

## 2014-09-06 DIAGNOSIS — C50919 Malignant neoplasm of unspecified site of unspecified female breast: Secondary | ICD-10-CM

## 2014-09-06 MED ORDER — POTASSIUM CHLORIDE ER 10 MEQ PO CPCR: 10.0000 meq | ORAL_CAPSULE | Freq: Two times a day (BID) | ORAL | Status: DC

## 2014-09-06 MED ORDER — LOVASTATIN 40 MG PO TABS: 40.0000 mg | ORAL_TABLET | Freq: Every day | ORAL | Status: DC

## 2014-09-06 MED ORDER — TAMOXIFEN CITRATE 20 MG PO TABS: ORAL_TABLET | ORAL | Status: DC

## 2014-09-06 MED ORDER — ALPRAZOLAM 0.25 MG PO TABS: ORAL_TABLET | ORAL | Status: DC

## 2014-09-06 MED ORDER — LOSARTAN POTASSIUM 50 MG PO TABS: 50.0000 mg | ORAL_TABLET | Freq: Every day | ORAL | Status: DC

## 2014-09-06 NOTE — Assessment & Plan Note (Signed)
stable overall by history and exam, recent data reviewed with pt, and pt to continue medical treatment as before,  to f/u any worsening symptoms or concerns BP Readings from Last 3 Encounters:  09/06/14 130/86  04/24/14 142/74  03/07/14 118/78

## 2014-09-06 NOTE — Progress Notes (Signed)
Subjective:    Patient ID: Tammy Wolfe, female    DOB: Mar 18, 1941, 74 y.o.   MRN: [id number]  HPI  /Here for yearly f/u;  Overall doing ok;  Pt denies Chest pain, worsening SOB, DOE, wheezing, orthopnea, PND, worsening LE edema, palpitations, dizziness or syncope.  Pt denies neurological change such as new headache, facial or extremity weakness.  Pt denies polydipsia, polyuria, or low sugar symptoms. Pt states overall good compliance with treatment and medications, good tolerability, and has been trying to follow appropriate diet.  Pt denies worsening depressive symptoms, suicidal ideation or panic. No fever, night sweats, wt loss, loss of appetite, or other constitutional symptoms.  Pt states good ability with ADL's, has low fall risk, home safety reviewed and adequate, no other significant changes in hearing or vision, and only occasionally active with exercise.  Ses Dr Medoff/GI for colonoscopy, now due, pt states will call for appt. BCBS was hacked , so she has had a time with getting her meds paid for, since she is for some reason not listed with BCBS.  Has not been taking mult of her meds since earlier this yr, cannot afford to pay out of pocket.  Asks for rx to take to pharmacy so she can get filled when insurance gets staightened out.  Not been taking the tamoxifen, losartan, k supplement, or lovastatin since late dec 2015.  Only pays out of pocket for her xanax since only $85mo.  Only takes a rare hydrocodone - #100 lasts for 1 yr.  Due for f/u CT chest for Lung cancer screeening June 2016 Past Medical History  Diagnosis Date  . ABNORMAL THYROID FUNCTION TESTS 12/10/2009  . ALCOHOLIC HEPATITIS, HX OF 02/14/2007  . ALLERGIC RHINITIS 02/14/2007  . ANXIETY 03/19/2007  . Breast implant removal status 12/23/2008  . COLONIC POLYPS, HX OF 02/14/2007  . GOITER, MULTINODULAR 02/14/2007  . HX, PERSONAL, ALCOHOLISM 02/14/2007  . HYPERLIPIDEMIA 02/14/2007  . HYPERTENSION 02/14/2007  . OSTEOARTHRITIS 02/14/2007    . OSTEOPOROSIS 02/14/2007  . PANCREATITIS, HX OF 02/14/2007  . Anemia, unspecified 04/26/2012  . Breast cancer   . Insomnia   . Smoker 09/06/2013  . Coronary artery calcification seen on CT scan 10/31/2013   Past Surgical History  Procedure Laterality Date  . Cataract extraction    . S/p jaw and periodontal surgury  2009  . Peridontal  2010  . Breast surgery  [id number]    silicone prepectoral implants explanted by Dr. Towanda Malkin after rupture  . Breast surgery      left simple mastectomy and left axillary sentinel node biopsy, right prophylactic mastectomy    reports that she has been smoking Cigarettes.  She has been smoking about 0.50 packs per day. She has never used smokeless tobacco. She reports that she drinks alcohol. She reports that she does not use illicit drugs. family history is not on file. She was adopted. Allergies  Allergen Reactions  . Tetracycline Anaphylaxis   Current Outpatient Prescriptions on File Prior to Visit  Medication Sig Dispense Refill  . ALPRAZolam (XANAX) 0.25 MG tablet TAKE ONE TABLET THREE TIMES DAILY AS NEEDED 90 tablet 2  . Cholecalciferol (VITAMIN D) 1000 UNITS capsule Take 1,000 Units by mouth daily.      . cyanocobalamin ([facility]S VITAMIN B12) 1000 MCG tablet Take 0.5 tablets (500 mcg total) by mouth daily. 30 tablet 4  . HYDROcodone-acetaminophen (NORCO/VICODIN) 5-325 MG per tablet Take 1 tablet by mouth 2 (two) times daily as needed. 100 tablet 0  .  losartan (COZAAR) 50 MG tablet Take 1 tablet (50 mg total) by mouth daily. 90 tablet 3  . lovastatin (MEVACOR) 40 MG tablet Take 1 tablet (40 mg total) by mouth daily. 90 tablet 3  . potassium chloride (MICRO-K) 10 MEQ CR capsule Take 1 capsule (10 mEq total) by mouth 2 (two) times daily. 60 capsule 5  . tamoxifen (NOLVADEX) 20 MG tablet TAKE ONE TABLET BY MOUTH ONCE DAILY 90 tablet 3   No current facility-administered medications on file prior to visit.   Review of Systems Constitutional: Negative for  increased diaphoresis, other activity, appetite or siginficant weight change other than noted HENT: Negative for worsening hearing loss, ear pain, facial swelling, mouth sores and neck stiffness.   Eyes: Negative for other worsening pain, redness or visual disturbance.  Respiratory: Negative for shortness of breath and wheezing  Cardiovascular: Negative for chest pain and palpitations.  Gastrointestinal: Negative for diarrhea, blood in stool, abdominal distention or other pain Genitourinary: Negative for hematuria, flank pain or change in urine volume.  Musculoskeletal: Negative for myalgias or other joint complaints.  Skin: Negative for color change and wound or drainage.  Neurological: Negative for syncope and numbness. other than noted Hematological: Negative for adenopathy. or other swelling Psychiatric/Behavioral: Negative for hallucinations, SI, self-injury, decreased concentration or other worsening agitation.      Objective:   Physical Exam BP 130/86 mmHg  Pulse 59  Temp(Src) 98.2 F (36.8 C) (Oral)  Resp 18  Ht 5' (1.524 m)  Wt 90 lb (40.824 kg)  BMI 17.58 kg/m2  SpO2 94% VS noted,  Constitutional: Pt is oriented to person, place, and time. Appears well-developed and well-nourished, in no significant distress Head: Normocephalic and atraumatic.  Right Ear: External ear normal.  Left Ear: External ear normal.  Nose: Nose normal.  Mouth/Throat: Oropharynx is clear and moist.  Eyes: Conjunctivae and EOM are normal. Pupils are equal, round, and reactive to light.  Neck: Normal range of motion. Neck supple. No JVD present. No tracheal deviation present or significant neck LA or mass Cardiovascular: Normal rate, regular rhythm, normal heart sounds and intact distal pulses.   Pulmonary/Chest: Effort normal and breath sounds without rales or wheezing  Abdominal: Soft. Bowel sounds are normal. NT. No HSM  Musculoskeletal: Normal range of motion. Exhibits no edema.    Lymphadenopathy:  Has no cervical adenopathy.  Neurological: Pt is alert and oriented to person, place, and time. Pt has normal reflexes. No cranial nerve deficit. Motor grossly intact Skin: Skin is warm and dry. No rash noted.  Psychiatric:  Has normal mood and affect. Behavior is normal.      Assessment & Plan:

## 2014-09-06 NOTE — Assessment & Plan Note (Signed)
stable overall by history and exam, recent data reviewed with pt, and pt to continue medical treatment as before,  to f/u any worsening symptoms or concerns SpO2 Readings from Last 3 Encounters:  09/06/14 94%  03/07/14 95%  11/02/13 100%

## 2014-09-06 NOTE — Patient Instructions (Signed)
Please continue all other medications as before, and refills have been done if requested.  Please have the pharmacy call with any other refills you may need.  Please continue your efforts at being more active, low cholesterol diet, and weight control.  You are otherwise up to date with prevention measures today.  Please keep your appointments with your specialists as you may have planned  Please return in 6 months, or sooner if needed 

## 2014-09-06 NOTE — Assessment & Plan Note (Signed)
stable overall by history and exam, recent data reviewed with pt, and pt to continue medical treatment as before,  to f/u any worsening symptoms or concerns Lab Results  Component Value Date   HGBA1C 6.3 03/08/2013  Pt declines lab f/u at this time, prefers done at cancer center in July 2016 next appt

## 2014-09-06 NOTE — Assessment & Plan Note (Signed)
Currently not taking statin due to her insurance issue with [employer], pt feels may be taken care as early as today, will give hardcopy rx for pt to use when able to afford

## 2014-12-04 ENCOUNTER — Other Ambulatory Visit: Payer: Self-pay | Admitting: *Deleted

## 2014-12-04 DIAGNOSIS — C50412 Malignant neoplasm of upper-outer quadrant of left female breast: Secondary | ICD-10-CM

## 2014-12-04 DIAGNOSIS — E876 Hypokalemia: Secondary | ICD-10-CM

## 2014-12-05 ENCOUNTER — Other Ambulatory Visit (HOSPITAL_BASED_OUTPATIENT_CLINIC_OR_DEPARTMENT_OTHER): Payer: Medicare Other

## 2014-12-05 DIAGNOSIS — C50412 Malignant neoplasm of upper-outer quadrant of left female breast: Secondary | ICD-10-CM

## 2014-12-05 DIAGNOSIS — E876 Hypokalemia: Secondary | ICD-10-CM

## 2014-12-05 LAB — COMPREHENSIVE METABOLIC PANEL (CC13)
ALK PHOS: 65 U/L (ref 40–150)
ALT: 12 U/L (ref 0–55)
AST: 17 U/L (ref 5–34)
Albumin: 3.5 g/dL (ref 3.5–5.0)
Anion Gap: 7 mEq/L (ref 3–11)
BUN: 17.3 mg/dL (ref 7.0–26.0)
CO2: 22 meq/L (ref 22–29)
Calcium: 8.7 mg/dL (ref 8.4–10.4)
Chloride: 113 mEq/L — ABNORMAL HIGH (ref 98–109)
Creatinine: 0.8 mg/dL (ref 0.6–1.1)
EGFR: 70 mL/min/{1.73_m2} — ABNORMAL LOW (ref >90)
Glucose: 97 mg/dl (ref 70–140)
Potassium: 4 mEq/L (ref 3.5–5.1)
Sodium: 142 mEq/L (ref 136–145)
Total Bilirubin: 0.35 mg/dL (ref 0.20–1.20)
Total Protein: 6.7 g/dL (ref 6.4–8.3)

## 2014-12-05 LAB — CBC WITH DIFFERENTIAL/PLATELET
BASO%: 0.5 % (ref 0.0–2.0)
Basophils Absolute: 0 10*3/uL (ref 0.0–0.1)
EOS ABS: 0.1 10*3/uL (ref 0.0–0.5)
EOS%: 1.3 % (ref 0.0–7.0)
HCT: 38.3 % (ref 34.8–46.6)
HGB: 12.3 g/dL (ref 11.6–15.9)
LYMPH#: 1.9 10*3/uL (ref 0.9–3.3)
LYMPH%: 18.6 % (ref 14.0–49.7)
MCH: 25.8 pg (ref 25.1–34.0)
MCHC: 32 g/dL (ref 31.5–36.0)
MCV: 80.6 fL (ref 79.5–101.0)
MONO#: 0.5 10*3/uL (ref 0.1–0.9)
MONO%: 4.8 % (ref 0.0–14.0)
NEUT#: 7.8 10*3/uL — ABNORMAL HIGH (ref 1.5–6.5)
NEUT%: 74.8 % (ref 38.4–76.8)
PLATELETS: 290 10*3/uL (ref 145–400)
RBC: 4.75 10*6/uL (ref 3.70–5.45)
RDW: 16 % — ABNORMAL HIGH (ref 11.2–14.5)
WBC: 10.4 10*3/uL — AB (ref 3.9–10.3)

## 2014-12-12 ENCOUNTER — Telehealth: Payer: Self-pay | Admitting: *Deleted

## 2014-12-12 ENCOUNTER — Other Ambulatory Visit: Payer: Self-pay | Admitting: *Deleted

## 2014-12-12 ENCOUNTER — Encounter: Payer: Self-pay | Admitting: Nurse Practitioner

## 2014-12-12 ENCOUNTER — Telehealth: Payer: Self-pay | Admitting: Nurse Practitioner

## 2014-12-12 ENCOUNTER — Ambulatory Visit (HOSPITAL_BASED_OUTPATIENT_CLINIC_OR_DEPARTMENT_OTHER): Payer: Medicare Other | Admitting: Nurse Practitioner

## 2014-12-12 ENCOUNTER — Ambulatory Visit (HOSPITAL_BASED_OUTPATIENT_CLINIC_OR_DEPARTMENT_OTHER): Payer: Medicare Other

## 2014-12-12 VITALS — BP 126/76 | HR 103 | Temp 98.1°F | Resp 18 | Ht 60.0 in | Wt 93.1 lb

## 2014-12-12 DIAGNOSIS — C50412 Malignant neoplasm of upper-outer quadrant of left female breast: Secondary | ICD-10-CM | POA: Diagnosis not present

## 2014-12-12 DIAGNOSIS — R300 Dysuria: Secondary | ICD-10-CM

## 2014-12-12 LAB — URINALYSIS, MICROSCOPIC - CHCC
BILIRUBIN (URINE): NEGATIVE
Glucose: NEGATIVE mg/dL
KETONES: NEGATIVE mg/dL
NITRITE: NEGATIVE
PROTEIN: NEGATIVE mg/dL
SPECIFIC GRAVITY, URINE: 1.015 (ref 1.003–1.035)
UROBILINOGEN UR: 0.2 mg/dL (ref 0.2–1)
pH: 6 (ref 4.6–8.0)

## 2014-12-12 MED ORDER — CIPROFLOXACIN HCL 500 MG PO TABS: 500.0000 mg | ORAL_TABLET | Freq: Two times a day (BID) | ORAL | Status: DC

## 2014-12-12 NOTE — Telephone Encounter (Signed)
Gave avs & calendar for July 2017

## 2014-12-12 NOTE — Telephone Encounter (Signed)
Called pt to inform her of results of Urinalysis which indicated that she has a UTI. Communicated to pt that a Rx for Cipro 500 mg has been sent to her pharmacy and she should pickup today and start medication. Pt verbalized understanding. No further concerns. Message to be fwd to Gentry Fitz, NP.

## 2014-12-12 NOTE — Progress Notes (Signed)
[facility]  [phone]) [phone] Fax:(336) [phone]  OFFICE PROGRESS NOTE   ID: Tammy Wolfe   DOB: 1941-01-21  MR#: [id number]  MWU#:[id number]   PCP: Cathlean Cower, MD SURGEON:  Rolm Bookbinder, MD  CHIEF COMPLAINT:  Hx of Left Breast Cancer  CURRENT TREATMENT: Tamoxifen  BREAST CANCER HISTORY: From Dr. Eston Esters is New Patient Evaluation Note dated 12/20/2010:  "This is a very pleasant 74 year old woman who is here with her daughter, Tammy Wolfe, to discuss her recently diagnosed breast cancer. This woman has been in good health.  She is a [place] native.  She has undergone annual screening mammography.  She had a screening mammogram on 11/12/2010 which showed the presence of bilateral silicone implants as well as numerous dense hyperdense material.  BSGI was recommended because of this.  A BSGI was performed which showed a 1.4 cm focus of moderate intense activity upper outer quadrant at the 10 o'clock position on the left.  There was a 1.6 cm focus of isotope activity at the 3 o'clock position.  Bilateral ultrasounds were used to evaluate these areas.  An ultrasound did suggest the presence of a mass at the 11 o'clock position in the right breast.  Both these areas were biopsied on 11/20/2010 and pathology did show an unremarkable biopsy on the right; on the left there was an invasive ductal cancer grade 2-3 with LVI noted.  This was felt to be a grade 2-3 lesion, ER positive 100%, PR positive 98%, proliferative index 23%, HER-2 was not amplified with a ratio of 1.48.  The patient did undergo an MRI scan of both breasts.  Initially had a 1.2 x 1.2 x 0.7 cm mass left breast consistent with invasive ductal cancer; on the right side there was a 1.1 x 0.8 x 0.7 cm mass.  The patient elected to undergo bilateral mastectomies which she did on 12/10/2010.  On the left side there was invasive ductal cancer measuring 1.4 cm and was grade 1 of 3 with two negative sentinel lymph nodes.  On  the right side there was a benign lymph node and no evidence of malignancy.  Surgical biopsies were all clear.  Once again the prognostic panel suggested a low proliferative index and non-amplified ratio of 1.48.  The patient has had an unremarkable postoperative course."    Her subsequent history is as detailed below.  INTERVAL HISTORY: Tammy Wolfe returns today for follow-up of her breast cancer. She has been on tamoxifen since July 2012 and is tolerating this drug well with no side effects that she is aware of. Specifically, she denies hot flashes or vaginal changes. The interval history is remarkable for the death of her son 2 months ago. She is grieving appropriately, but did manage to lose 10LB. She has gained 3 of them back.  REVIEW OF SYSTEMS: Tammy Wolfe denies fevers, chills, nausea or vomiting. She complains of dysuria x1 week, and she has had frequent UTIs in the past. She denies frequency or urgency. She uses stool softeners occasionally because she has been constipated lately. She has back and joint pain chronically. Her PCP gives her a 30 pill supply of hydrocodone yearly and this lasts her. She does walk and garden for exercise. She lives alone and performs all of her own chores. She denies shortness of breath, or chest pain, but does have palpitations. She under a lot of stress since her son's passing and has always been anxious. She continues to use xanax PRN QHS. A detailed review of systems  is otherwise stable.  PAST MEDICAL HISTORY: Past Medical History  Diagnosis Date  . ABNORMAL THYROID FUNCTION TESTS 12/10/2009  . ALCOHOLIC HEPATITIS, HX OF 02/14/2007  . ALLERGIC RHINITIS 02/14/2007  . ANXIETY 03/19/2007  . Breast implant removal status 12/23/2008  . COLONIC POLYPS, HX OF 02/14/2007  . GOITER, MULTINODULAR 02/14/2007  . HX, PERSONAL, ALCOHOLISM 02/14/2007  . HYPERLIPIDEMIA 02/14/2007  . HYPERTENSION 02/14/2007  . OSTEOARTHRITIS 02/14/2007  . OSTEOPOROSIS 02/14/2007  . PANCREATITIS, HX OF  02/14/2007  . Anemia, unspecified 04/26/2012  . Breast cancer   . Insomnia   . Smoker 09/06/2013  . Coronary artery calcification seen on CT scan 10/31/2013    PAST SURGICAL HISTORY: Past Surgical History  Procedure Laterality Date  . Cataract extraction    . S/p jaw and periodontal surgury  2009  . Peridontal  2010  . Breast surgery  [id number]    silicone prepectoral implants explanted by Dr. Towanda Malkin after rupture  . Breast surgery      left simple mastectomy and left axillary sentinel node biopsy, right prophylactic mastectomy    FAMILY HISTORY Family History  Problem Relation Age of Onset  . Adopted: Yes    GYNECOLOGIC HISTORY:  (Reviewed 10/19/2013) She is G3, P3 and she was on hormone replacement therapy for 15 years but discontinued in 2007.  Menarche at age 56.  SOCIAL HISTORY:  (Reviewed 10/19/2013) She has been divorced for 30 years after being married twice.  Currently lives alone.  [place] 77[place]  She has 3 children, two in [place] and one in [place].  She has 2 grandchildren, a grandson who is attending [employer]. infrequently comes for supper, and a granddaughter going to [employer], who frequently comes for lunch.   ADVANCED DIRECTIVES: In place HEALTH MAINTENANCE:  (Updated 10/19/2013) History  Substance Use Topics  . Smoking status: Current Every Day Smoker -- 0.50 packs/day    Types: Cigarettes  . Smokeless tobacco: Never Used  . Alcohol Use: 0.0 oz/week     Comment: one glass/wine occasional    Colonoscopy: The patient's last colonoscopy we have on record was in 2006 PAP:  04/25/2008 Bone density:  Last bone density scan was on 03/04/2011 which showed osteoporosis with T score of -2.9 Lipid panel: 03/08/2013/Dr. Jenny Reichmann   Allergies  Allergen Reactions  . Tetracycline Anaphylaxis    Current Outpatient Prescriptions  Medication Sig Dispense Refill  . ALPRAZolam (XANAX) 0.25 MG tablet TAKE ONE TABLET  THREE TIMES DAILY AS NEEDED 90 tablet 5  . Cholecalciferol (VITAMIN D) 1000 UNITS capsule Take 1,000 Units by mouth daily.      Marland Kitchen losartan (COZAAR) 50 MG tablet Take 1 tablet (50 mg total) by mouth daily. 90 tablet 3  . lovastatin (MEVACOR) 40 MG tablet Take 1 tablet (40 mg total) by mouth daily. 90 tablet 3  . potassium chloride (MICRO-K) 10 MEQ CR capsule Take 1 capsule (10 mEq total) by mouth 2 (two) times daily. 180 capsule 3  . tamoxifen (NOLVADEX) 20 MG tablet TAKE ONE TABLET BY MOUTH ONCE DAILY 90 tablet 3  . cyanocobalamin ([facility]S VITAMIN B12) 1000 MCG tablet Take 0.5 tablets (500 mcg total) by mouth daily. (Patient not taking: Reported on 12/12/2014) 30 tablet 4  . HYDROcodone-acetaminophen (NORCO/VICODIN) 5-325 MG per tablet Take 1 tablet by mouth 2 (two) times daily as needed. (Patient not taking: Reported on 12/12/2014) 100 tablet 0   No current facility-administered medications for this visit.  OBJECTIVE: Older a white woman in no physical distress  Filed Vitals:   12/12/14 1045  BP: 126/76  Pulse: 103  Temp: 98.1 F (36.7 C)  Resp: 18     Body mass index is 18.18 kg/(m^2).    ECOG FS:  0 Filed Weights   12/12/14 1045  Weight: 93 lb 1.6 oz (42.23 kg)   Skin: warm, dry  HEENT: sclerae anicteric, conjunctivae pink, oropharynx clear. No thrush or mucositis.  Lymph Nodes: No cervical or supraclavicular lymphadenopathy  Lungs: clear to auscultation bilaterally, no rales, wheezes, or rhonci  Heart: regular rate and rhythm  Abdomen: round, soft, non tender, positive bowel sounds  Musculoskeletal: No focal spinal tenderness, no peripheral edema  Neuro: non focal, well oriented, positive affect  Breasts: bilateral breasts status post mastectomies. No evidence of chest wall recurrence. Bilateral axillae benign.   RESULTS: Lab Results  Component Value Date   WBC 10.4* 12/05/2014   NEUTROABS 7.8* 12/05/2014   HGB 12.3 12/05/2014   HCT 38.3 12/05/2014   MCV 80.6 12/05/2014    PLT 290 12/05/2014      Chemistry      Component Value Date/Time   NA 142 12/05/2014 0855   NA 142 03/08/2013 0908   K 4.0 12/05/2014 0855   K 3.8 03/08/2013 0908   CL 116* 03/08/2013 0908   CL 111* 03/19/2012 1019   CO2 22 12/05/2014 0855   CO2 20 03/08/2013 0908   BUN 17.3 12/05/2014 0855   BUN 20 03/08/2013 0908   CREATININE 0.8 12/05/2014 0855   CREATININE 0.9 03/08/2013 0908      Component Value Date/Time   CALCIUM 8.7 12/05/2014 0855   CALCIUM 8.6 03/08/2013 0908   ALKPHOS 65 12/05/2014 0855   ALKPHOS 41 03/08/2013 0908   AST 17 12/05/2014 0855   AST 18 03/08/2013 0908   ALT 12 12/05/2014 0855   ALT 11 03/08/2013 0908   BILITOT 0.35 12/05/2014 0855   BILITOT 0.4 03/08/2013 0908      STUDIES: No results found.  ASSESSMENT: 74 y.o. [place] woman:  1.  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  2.  Status post bilateral breast MRI on 11/18/2010 which noted a history of bilateral silicone breast implants removed in 2010 due to implant rupture.  The MRI also showed a left breast 1.2 cm mass at the 2:30 position, and a 1.1 cm intramammary lymph node on the right breast.    3.  Status post right breast needle core biopsy on 11/20/2010 which showed no evidence of malignancy.  4.  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 The patient started antiestrogen therapy with Tamoxifen in 11/2010, the plan being to continue for total of 5 years (until July 2017)  6.  Osteoporosis with T-2.9 score 03/04/2011 (at [facility])    PLAN: Tammy Wolfe is doing well as far as her breast  cancer is concerned. She is now 4 year out from her bilateral mastectomies with no evidence of recurrent disease. She is tolerating the tamoxifen well and will continue this drug until July of next year which will be her 5 year anniversary on this  pill.   I have asked her to collect a urine sample today to investigate her dysuria. The labs drawn last week showed just a borderline elevation in her WBC. This might correlate to a UTI. We will call in the appropriate antibiotic for her if this is the case.   Tammy Wolfe will return in 1 year for labs and a follow up visit. At this time, she may be eligible to graduate from follow up visits. She understands and agrees with this plan. She knows the goal of treatment in her case is cure. She has been encouraged to call with any issues that might arise before her next visit here.    Laurie Panda, NP  12/12/2014, 11:03 AM

## 2014-12-15 LAB — URINE CULTURE

## 2015-01-26 DIAGNOSIS — C50912 Malignant neoplasm of unspecified site of left female breast: Secondary | ICD-10-CM | POA: Diagnosis not present

## 2015-02-14 ENCOUNTER — Telehealth: Payer: Self-pay

## 2015-02-14 NOTE — Telephone Encounter (Signed)
Patient called to educate on Medicare Wellness apt. LVM for the patient to call back to educate and schedule for wellness visit.   

## 2015-02-20 NOTE — Telephone Encounter (Signed)
LVM to discuss AWV apt and left phone number for call back when convenient.

## 2015-02-21 NOTE — Telephone Encounter (Signed)
Call back from Ms. Pangelinan and agrees to come in prior to her apt on 10/19 at 8:30 and will see Dr. Jenny Reichmann at 9:15

## 2015-03-14 ENCOUNTER — Encounter: Payer: Self-pay | Admitting: Internal Medicine

## 2015-03-14 ENCOUNTER — Ambulatory Visit (INDEPENDENT_AMBULATORY_CARE_PROVIDER_SITE_OTHER): Payer: Medicare Other | Admitting: Internal Medicine

## 2015-03-14 ENCOUNTER — Other Ambulatory Visit (INDEPENDENT_AMBULATORY_CARE_PROVIDER_SITE_OTHER): Payer: Medicare Other

## 2015-03-14 ENCOUNTER — Other Ambulatory Visit: Payer: Self-pay | Admitting: Internal Medicine

## 2015-03-14 VITALS — BP 114/80 | HR 112 | Temp 98.4°F | Ht 60.0 in | Wt 94.2 lb

## 2015-03-14 DIAGNOSIS — I10 Essential (primary) hypertension: Secondary | ICD-10-CM | POA: Diagnosis not present

## 2015-03-14 DIAGNOSIS — Z23 Encounter for immunization: Secondary | ICD-10-CM

## 2015-03-14 DIAGNOSIS — E785 Hyperlipidemia, unspecified: Secondary | ICD-10-CM | POA: Diagnosis not present

## 2015-03-14 DIAGNOSIS — J449 Chronic obstructive pulmonary disease, unspecified: Secondary | ICD-10-CM | POA: Diagnosis not present

## 2015-03-14 DIAGNOSIS — Z0000 Encounter for general adult medical examination without abnormal findings: Secondary | ICD-10-CM

## 2015-03-14 DIAGNOSIS — R7302 Impaired glucose tolerance (oral): Secondary | ICD-10-CM | POA: Diagnosis not present

## 2015-03-14 DIAGNOSIS — R300 Dysuria: Secondary | ICD-10-CM | POA: Diagnosis not present

## 2015-03-14 LAB — HEPATIC FUNCTION PANEL
ALBUMIN: 4 g/dL (ref 3.5–5.2)
ALT: 10 U/L (ref 0–35)
AST: 17 U/L (ref 0–37)
Alkaline Phosphatase: 48 U/L (ref 39–117)
BILIRUBIN TOTAL: 0.3 mg/dL (ref 0.2–1.2)
Bilirubin, Direct: 0 mg/dL (ref 0.0–0.3)
TOTAL PROTEIN: 7.3 g/dL (ref 6.0–8.3)

## 2015-03-14 LAB — URINALYSIS, ROUTINE W REFLEX MICROSCOPIC
Bilirubin Urine: NEGATIVE
Ketones, ur: NEGATIVE
Nitrite: NEGATIVE
PH: 6 (ref 5.0–8.0)
Specific Gravity, Urine: 1.02 (ref 1.000–1.030)
Urine Glucose: NEGATIVE
Urobilinogen, UA: 0.2 (ref 0.0–1.0)

## 2015-03-14 LAB — BASIC METABOLIC PANEL
BUN: 22 mg/dL (ref 6–23)
CALCIUM: 9.1 mg/dL (ref 8.4–10.5)
CO2: 18 meq/L — AB (ref 19–32)
CREATININE: 0.96 mg/dL (ref 0.40–1.20)
Chloride: 112 mEq/L (ref 96–112)
GFR: 60.28 mL/min (ref >60.00)
Glucose, Bld: 106 mg/dL — ABNORMAL HIGH (ref 70–99)
Potassium: 3.8 mEq/L (ref 3.5–5.1)
Sodium: 141 mEq/L (ref 135–145)

## 2015-03-14 LAB — CBC WITH DIFFERENTIAL/PLATELET
BASOS PCT: 0.4 % (ref 0.0–3.0)
Basophils Absolute: 0 10*3/uL (ref 0.0–0.1)
EOS ABS: 0.1 10*3/uL (ref 0.0–0.7)
Eosinophils Relative: 1.1 % (ref 0.0–5.0)
HCT: 36.8 % (ref 36.0–46.0)
Hemoglobin: 12.1 g/dL (ref 12.0–15.0)
LYMPHS PCT: 26.1 % (ref 12.0–46.0)
Lymphs Abs: 2.2 10*3/uL (ref 0.7–4.0)
MCHC: 32.8 g/dL (ref 30.0–36.0)
MCV: 78.9 fl (ref 78.0–100.0)
MONO ABS: 0.4 10*3/uL (ref 0.1–1.0)
Monocytes Relative: 5 % (ref 3.0–12.0)
Neutro Abs: 5.8 10*3/uL (ref 1.4–7.7)
Neutrophils Relative %: 67.4 % (ref 43.0–77.0)
PLATELETS: 283 10*3/uL (ref 150.0–400.0)
RBC: 4.66 Mil/uL (ref 3.87–5.11)
RDW: 16.3 % — AB (ref 11.5–15.5)
WBC: 8.6 10*3/uL (ref 4.0–10.5)

## 2015-03-14 LAB — LIPID PANEL
Cholesterol: 175 mg/dL (ref 0–200)
HDL: 51.1 mg/dL (ref >39.00)
LDL Cholesterol: 96 mg/dL (ref 0–99)
NONHDL: 123.76
TRIGLYCERIDES: 141 mg/dL (ref 0.0–149.0)
Total CHOL/HDL Ratio: 3
VLDL: 28.2 mg/dL (ref 0.0–40.0)

## 2015-03-14 LAB — HEMOGLOBIN A1C: Hgb A1c MFr Bld: 6 % (ref 4.6–6.5)

## 2015-03-14 LAB — TSH: TSH: 0.53 u[IU]/mL (ref 0.35–4.50)

## 2015-03-14 MED ORDER — ALPRAZOLAM 0.25 MG PO TABS: ORAL_TABLET | ORAL | Status: DC

## 2015-03-14 MED ORDER — HYDROCODONE-ACETAMINOPHEN 5-325 MG PO TABS: 1.0000 | ORAL_TABLET | Freq: Two times a day (BID) | ORAL | Status: DC | PRN

## 2015-03-14 MED ORDER — CEPHALEXIN 500 MG PO CAPS: 500.0000 mg | ORAL_CAPSULE | Freq: Four times a day (QID) | ORAL | Status: DC

## 2015-03-14 NOTE — Assessment & Plan Note (Signed)
stable overall by history and exam, recent data reviewed with pt, and pt to continue medical treatment as before,  to f/u any worsening symptoms or concerns Lab Results  Component Value Date   HGBA1C 6.3 03/08/2013   For f'u lab, cont diet control

## 2015-03-14 NOTE — Addendum Note (Signed)
Addended by: Cresenciano Lick on: 03/14/2015 10:12 AM   Modules accepted: Orders

## 2015-03-14 NOTE — Progress Notes (Signed)
Subjective:   Tammy Wolfe is a 74 y.o. female who presents for Medicare Annual (Subsequent) preventive examination.  Review of Systems:   Cardiac Risk Factors include: advanced age (>69men, >34 women);dyslipidemia;smoking/ tobacco exposure;hypertension HRA assessment completed during visit; Tammy Wolfe, Tammy Wolfe The Patient was informed that this wellness visit is to identify risk and educate on how to reduce risk for increase disease through lifestyle changes.   ROS deferred to CPE exam with physician  Medical issues  Recently dx Breast Cancer; with bil mastectomies 12-23-10 Death of son in 2022/10/23 of this year;  A1c 02/2013 6.3; ? UTI at the time; 2014-12-23 Glucose was <90 Former smoker; Is smoking some and desires to quit in time Quit x 2 weeks when she was sick; 1 - 10 motivation is 6  "on the fence stage" contemplating; family was tobacco smokers;  I will have to make a conscious effort; gave up wine and can give this up when I am ready. Invited the patient to come back in to discuss if she would like and can give resources but not interested at this time  BMI: 18.4/ This her normal weight Diet; Breakfast; ensure; cereal; lunch salad or sandwich; dinner chicken a meal with vegetables; [place] son comes for supper and this reinforces her cooking;  Exercise; Walks every day; 1.5 miles at complex sometimes does 2 mile Exercises every day;   Psychosocial; 2 grand - children in college and one stays with her when home. Garden; 2 levels; condominium and has area where she can plant flowers;   SAFETY Safety reviewed for the home; including removal of clutter; clear paths through the home, eliminating clutter, railing as needed; bathroom safety; community safety; smoke detectors and firearms safety as well as sun protection;  Driving accidents and seatbelt Sun protection Stressors;   Medication review/ not taking B12  Fall assessment no falls   Mobilization and Functional losses in the last  year.no    Urinary or fecal incontinence reviewed / may have early UTI; defer to Dr. Jenny Reichmann    Counseling: Colonoscopy; Jan 2006/ In the process of reinstating insurance; now is enrollment period but only has partial coverage x 1 year;   EKG 12/06/2010 Dexa scan; -2.9 right femur; -2.6 left femur and -1.9 AP spine/ Took medicine years ago fosomax;  Dr. Jenny Reichmann scoliosis; has hydrocodone prn for back  Mammogram - post bil mastectomies Hearing: 4000hz  both ears Ophthalmology exam; eye examined x 1 year; 2017 Dentist; periodontal as well as dental work regularly;   Immunizations Due  Flu (high does 20014 and 20015)  Current Care Team reviewed and updated       Objective:     Vitals: BP 114/80 mmHg  Pulse 112  Temp(Src) 98.4 F (36.9 C) (Oral)  Ht 5' (1.524 m)  Wt 94 lb 4 oz (42.752 kg)  BMI 18.41 kg/m2  SpO2 96%  Tobacco History  Smoking status  . Current Every Day Smoker -- 0.50 packs/day  . Types: Cigarettes  Smokeless tobacco  . Never Used    Comment: trying to quit; not as much since cold     Ready to quit: Not Answered Counseling given: Yes   Past Medical History  Diagnosis Date  . ABNORMAL THYROID FUNCTION TESTS 12/10/2009  . ALCOHOLIC HEPATITIS, HX OF 02/14/2007  . ALLERGIC RHINITIS 02/14/2007  . ANXIETY 03/19/2007  . Breast implant removal status 12/23/2008  . COLONIC POLYPS, HX OF 02/14/2007  . GOITER, MULTINODULAR 02/14/2007  . HX, PERSONAL, ALCOHOLISM 02/14/2007  .  HYPERLIPIDEMIA 02/14/2007  . HYPERTENSION 02/14/2007  . OSTEOARTHRITIS 02/14/2007  . OSTEOPOROSIS 02/14/2007  . PANCREATITIS, HX OF 02/14/2007  . Anemia, unspecified 04/26/2012  . Breast cancer ([place])   . Insomnia   . Smoker 09/06/2013  . Coronary artery calcification seen on CT scan 10/31/2013   Past Surgical History  Procedure Laterality Date  . Cataract extraction    . S/p jaw and periodontal surgury  2009  . Peridontal  2010  . Breast surgery  7322    silicone prepectoral implants  explanted by Dr. Towanda Malkin after rupture  . Breast surgery      left simple mastectomy and left axillary sentinel node biopsy, right prophylactic mastectomy   Family History  Problem Relation Age of Onset  . Adopted: Yes  . Hyperlipidemia Brother    History  Sexual Activity  . Sexual Activity: Not on file    Outpatient Encounter Prescriptions as of 03/14/2015  Medication Sig  . ALPRAZolam (XANAX) 0.25 MG tablet TAKE ONE TABLET THREE TIMES DAILY AS NEEDED  . Cholecalciferol (VITAMIN D) 1000 UNITS capsule Take 1,000 Units by mouth daily.    Marland Kitchen HYDROcodone-acetaminophen (NORCO/VICODIN) 5-325 MG per tablet Take 1 tablet by mouth 2 (two) times daily as needed.  Marland Kitchen losartan (COZAAR) 50 MG tablet Take 1 tablet (50 mg total) by mouth daily.  Marland Kitchen lovastatin (MEVACOR) 40 MG tablet Take 1 tablet (40 mg total) by mouth daily.  . potassium chloride (MICRO-K) 10 MEQ CR capsule Take 1 capsule (10 mEq total) by mouth 2 (two) times daily.  . tamoxifen (NOLVADEX) 20 MG tablet TAKE ONE TABLET BY MOUTH ONCE DAILY  . cyanocobalamin ([facility] VITAMIN B12) 1000 MCG tablet Take 0.5 tablets (500 mcg total) by mouth daily. (Patient not taking: Reported on 12/12/2014)  . [DISCONTINUED] ciprofloxacin (CIPRO) 500 MG tablet Take 1 tablet (500 mg total) by mouth 2 (two) times daily. (Patient not taking: Reported on 03/14/2015)   No facility-administered encounter medications on file as of 03/14/2015.    Activities of Daily Living In your present state of health, do you have any difficulty performing the following activities: 03/14/2015  Hearing? N  Vision? N  Difficulty concentrating or making decisions? N  Walking or climbing stairs? N  Dressing or bathing? N  Doing errands, shopping? N  Preparing Food and eating ? N  Using the Toilet? N  In the past six months, have you accidently leaked urine? N  Do you have problems with loss of bowel control? N  Managing your Medications? N  Managing your Finances? N    Housekeeping or managing your Housekeeping? N    Patient Care Team: Biagio Borg, MD as PCP - General    Assessment:    Assessment   Patient presents for yearly preventative medicine examination. Medicare questionnaire screening were completed, i.e. Functional; fall risk; depression, memory loss and hearing. / reviewed and discussed  All immunizations and health maintenance protocols were reviewed with the patient and needed orders were placed. Will plan to take high does today if the doctor approves Education provided for laboratory screens;    Medication reconciliation, past medical history, social history, problem list and allergies were reviewed in detail with the patient   Goals were established with regard to weight loss, exercise, and diet in compliance with medications based on the patient individualized risk;   End of life planning was discussed. Deferred due to smoking   Exercise Activities and Dietary recommendations Current Exercise Habits:: Home exercise routine, Type of exercise:  walking, Time (Minutes): 45, Frequency (Times/Week): 5, Weekly Exercise (Minutes/Week): 225, Intensity: Mild No goals; makes her nervous.  Goals    . patient     Wants to be healthy as she can be      Fall Risk Fall Risk  03/14/2015 03/14/2015 03/07/2014 09/06/2013 03/08/2013  Falls in the past year? No No No No No   Depression Screen PHQ 2/9 Scores 03/14/2015 09/06/2014 03/07/2014 09/06/2013  PHQ - 2 Score 0 0 1 0     Cognitive Testing No flowsheet data found.  Immunization History  Administered Date(s) Administered  . Influenza Whole 03/19/2007  . Influenza, High Dose Seasonal PF 03/08/2013, 03/07/2014  . Pneumococcal Conjugate-13 03/29/2013  . Pneumococcal Polysaccharide-23 01/21/2006  . Td 11/12/2009  . Zoster 01/21/2006   Screening Tests Health Maintenance  Topic Date Due  . MAMMOGRAM  11/12/2012  . COLONOSCOPY  05/26/2014  . INFLUENZA VACCINE  12/25/2014  .  TETANUS/TDAP  11/13/2019  . DEXA SCAN  Completed  . ZOSTAVAX  Completed  . PNA vac Low Risk Adult  Completed      Plan:   Needs Xanax and hydrocodone refilled  Will be checked for UTI   During the course of the visit the patient was educated and counseled about the following appropriate screening and preventive services:   Vaccines to include Pneumoccal, Influenza, Hepatitis B, Td, Zostavax, HCV  Electrocardiogram  Cardiovascular Disease  Colorectal cancer screening  Bone density screening  Diabetes screening/ reviewed 2014 but last Glucose was normal  Glaucoma screening  Mammography/PAP  Nutrition counseling   Patient Instructions (the written plan) was given to the patient.   Wynetta Fines, RN  03/14/2015

## 2015-03-14 NOTE — Assessment & Plan Note (Signed)
stable overall by history and exam, recent data reviewed with pt, and pt to continue medical treatment as before,  to f/u any worsening symptoms or concerns Lab Results  Component Value Date   LDLCALC 69 03/08/2013

## 2015-03-14 NOTE — Progress Notes (Signed)
Subjective:    Patient ID: Tammy Wolfe, female    DOB: 03-Oct-1940, 74 y.o.   MRN: [id number]  HPI  Here for yearly f/u;  Overall doing ok;  Pt denies Chest pain, worsening SOB, DOE, wheezing, orthopnea, PND, worsening LE edema, palpitations, dizziness or syncope.  Pt denies neurological change such as new headache, facial or extremity weakness.  Pt denies polydipsia, polyuria, or low sugar symptoms. Pt states overall good compliance with treatment and medications, good tolerability, and has been trying to follow appropriate diet.  Pt denies worsening depressive symptoms, suicidal ideation or panic. No fever, night sweats, wt loss, loss of appetite, or other constitutional symptoms.  Pt states good ability with ADL's, has low fall risk, home safety reviewed and adequate, no other significant changes in hearing or vision, and only occasionally active with exercise. Son died in 05-06-16with MI, and 2 college attending children now live with her Has had counseling. Marland Kitchen Has also mild urinary symptoms such as dysuria, frequency, urgency, but no flank pain, hematuria or n/v, fever, chills. Declines colonoscpy for now.  Still smokes very occasionally,  Past Medical History  Diagnosis Date  . ABNORMAL THYROID FUNCTION TESTS 12/10/2009  . ALCOHOLIC HEPATITIS, HX OF 02/14/2007  . ALLERGIC RHINITIS 02/14/2007  . ANXIETY 03/19/2007  . Breast implant removal status 12/23/2008  . COLONIC POLYPS, HX OF 02/14/2007  . GOITER, MULTINODULAR 02/14/2007  . HX, PERSONAL, ALCOHOLISM 02/14/2007  . HYPERLIPIDEMIA 02/14/2007  . HYPERTENSION 02/14/2007  . OSTEOARTHRITIS 02/14/2007  . OSTEOPOROSIS 02/14/2007  . PANCREATITIS, HX OF 02/14/2007  . Anemia, unspecified 04/26/2012  . Breast cancer ([place])   . Insomnia   . Smoker 09/06/2013  . Coronary artery calcification seen on CT scan 10/31/2013   Past Surgical History  Procedure Laterality Date  . Cataract extraction    . S/p jaw and periodontal surgury  2009  . Peridontal  2010  .  Breast surgery  [id number]    silicone prepectoral implants explanted by Dr. Towanda Malkin after rupture  . Breast surgery      left simple mastectomy and left axillary sentinel node biopsy, right prophylactic mastectomy    reports that she has been smoking Cigarettes.  She has been smoking about 0.50 packs per day. She has never used smokeless tobacco. She reports that she drinks alcohol. She reports that she does not use illicit drugs. family history includes Hyperlipidemia in her brother. She was adopted. Allergies  Allergen Reactions  . Tetracycline Anaphylaxis   Current Outpatient Prescriptions on File Prior to Visit  Medication Sig Dispense Refill  . Cholecalciferol (VITAMIN D) 1000 UNITS capsule Take 1,000 Units by mouth daily.      Marland Kitchen losartan (COZAAR) 50 MG tablet Take 1 tablet (50 mg total) by mouth daily. 90 tablet 3  . lovastatin (MEVACOR) 40 MG tablet Take 1 tablet (40 mg total) by mouth daily. 90 tablet 3  . potassium chloride (MICRO-K) 10 MEQ CR capsule Take 1 capsule (10 mEq total) by mouth 2 (two) times daily. 180 capsule 3  . tamoxifen (NOLVADEX) 20 MG tablet TAKE ONE TABLET BY MOUTH ONCE DAILY 90 tablet 3  . cyanocobalamin ([facility] VITAMIN B12) 1000 MCG tablet Take 0.5 tablets (500 mcg total) by mouth daily. (Patient not taking: Reported on 12/12/2014) 30 tablet 4   No current facility-administered medications on file prior to visit.   Review of Systems Constitutional: Negative for increased diaphoresis, other activity, appetite or siginficant weight change other than noted HENT: Negative for worsening  hearing loss, ear pain, facial swelling, mouth sores and neck stiffness.   Eyes: Negative for other worsening pain, redness or visual disturbance.  Respiratory: Negative for shortness of breath and wheezing  Cardiovascular: Negative for chest pain and palpitations.  Gastrointestinal: Negative for diarrhea, blood in stool, abdominal distention or other pain Genitourinary: Negative for  hematuria, flank pain or change in urine volume.  Musculoskeletal: Negative for myalgias or other joint complaints.  Skin: Negative for color change and wound or drainage.  Neurological: Negative for syncope and numbness. other than noted Hematological: Negative for adenopathy. or other swelling Psychiatric/Behavioral: Negative for hallucinations, SI, self-injury, decreased concentration or other worsening agitation.      Objective:   Physical Exam BP 114/80 mmHg  Pulse 112  Temp(Src) 98.4 F (36.9 C) (Oral)  Ht 5' (1.524 m)  Wt 94 lb 4 oz (42.752 kg)  BMI 18.41 kg/m2  SpO2 96% VS noted,  Constitutional: Pt is oriented to person, place, and time. Appears well-developed and well-nourished, in no significant distress Head: Normocephalic and atraumatic.  Right Ear: External ear normal.  Left Ear: External ear normal.  Nose: Nose normal.  Mouth/Throat: Oropharynx is clear and moist.  Eyes: Conjunctivae and EOM are normal. Pupils are equal, round, and reactive to light.  Neck: Normal range of motion. Neck supple. No JVD present. No tracheal deviation present or significant neck LA or mass Cardiovascular: Normal rate, regular rhythm, normal heart sounds and intact distal pulses.   Pulmonary/Chest: Effort normal and breath sounds decresaed without rales or wheezing  Abdominal: Soft. Bowel sounds are normal. NT. No HSM  Musculoskeletal: Normal range of motion. Exhibits no edema.  Lymphadenopathy:  Has no cervical adenopathy.  Neurological: Pt is alert and oriented to person, place, and time. Pt has normal reflexes. No cranial nerve deficit. Motor grossly intact Skin: Skin is warm and dry. No rash noted.  Psychiatric:  Has normal mood and affect. Behavior is normal.         Assessment & Plan:

## 2015-03-14 NOTE — Assessment & Plan Note (Signed)
?   UTI - for urine studies , may need antibx tx,  to f/u any worsening symptoms or concerns

## 2015-03-14 NOTE — Assessment & Plan Note (Signed)
stable overall by history and exam, recent data reviewed with pt, and pt to continue medical treatment as before,  to f/u any worsening symptoms or concerns BP Readings from Last 3 Encounters:  03/14/15 114/80  12/12/14 126/76  09/06/14 130/86

## 2015-03-14 NOTE — Patient Instructions (Addendum)
You had the flu shot today  Please continue all other medications as before, and refills have been done if requested.  Please have the pharmacy call with any other refills you may need.  Please continue your efforts at being more active, low cholesterol diet, and weight control.  You are otherwise up to date with prevention measures today.  Please keep your appointments with your specialists as you may have planned  Please go to the LAB in the Basement (turn left off the elevator) for the tests to be done today  You will be contacted by phone if any changes need to be made immediately.  Otherwise, you will receive a letter about your results with an explanation, but please check with MyChart first.  Please remember to sign up for MyChart if you have not done so, as this will be important to you in the future with finding out test results, communicating by private email, and scheduling acute appointments online when needed.  Please return in 6 months, or sooner if needed   Tammy Wolfe , Thank you for taking time to come for your Medicare Wellness Visit. I appreciate your ongoing commitment to your Tammy goals. Please review the following plan we discussed and let me know if I can assist you in the future.   [place] quit and desires to quit  These are the goals we discussed: Goals    . patient     Wants to be healthy as she can be       This is a list of the screening recommended for you and due dates:  Tammy Wolfe  Topic Date Due  . Mammogram  11/12/2012  . Colon Cancer Screening  05/26/2014  . Flu Shot  12/25/2014  . Tetanus Vaccine  11/13/2019  . DEXA scan (bone density measurement)  Completed  . Shingles Vaccine  Completed  . Pneumonia vaccines  Completed     Tammy Wolfe, Female Adopting a healthy lifestyle and getting preventive care can go a long way to promote Tammy and wellness. Talk with your Tammy care provider about what schedule of regular  examinations is right for you. This is a good chance for you to check in with your provider about disease prevention and staying healthy. In between checkups, there are plenty of things you can do on your own. Experts have done a lot of research about which lifestyle changes and preventive measures are most likely to keep you healthy. Ask your Tammy care provider for more information. WEIGHT AND DIET  Eat a healthy diet  Be sure to include plenty of vegetables, fruits, low-fat dairy products, and lean protein.  Do not eat a lot of foods high in solid fats, added sugars, or salt.  Get regular exercise. This is one of the most important things you can do for your Tammy.  Most adults should exercise for at least 150 minutes each week. The exercise should increase your heart rate and make you sweat (moderate-intensity exercise).  Most adults should also do strengthening exercises at least twice a week. This is in addition to the moderate-intensity exercise.  Maintain a healthy weight  Body mass index (BMI) is a measurement that can be used to identify possible weight problems. It estimates body fat based on height and weight. Your Tammy care provider can help determine your BMI and help you achieve or maintain a healthy weight.  For females 22 years of age and older:   A BMI below 18.5 is considered  underweight.  A BMI of 18.5 to 24.9 is normal.  A BMI of 25 to 29.9 is considered overweight.  A BMI of 30 and above is considered obese.  Watch levels of cholesterol and blood lipids  You should start having your blood tested for lipids and cholesterol at 74 years of age, then have this test every 5 years.  You may need to have your cholesterol levels checked more often if:  Your lipid or cholesterol levels are high.  You are older than 74 years of age.  You are at high risk for heart disease.  CANCER SCREENING   Lung Cancer  Lung cancer screening is recommended for adults  68-58 years old who are at high risk for lung cancer because of a history of smoking.  A yearly low-dose CT scan of the lungs is recommended for people who:  Currently smoke.  Have quit within the past 15 years.  Have at least a 30-pack-year history of smoking. A pack year is smoking an average of one pack of cigarettes a day for 1 year.  Yearly screening should continue until it has been 15 years since you quit.  Yearly screening should stop if you develop a Tammy problem that would prevent you from having lung cancer treatment.  Breast Cancer  Practice breast self-awareness. This means understanding how your breasts normally appear and feel.  It also means doing regular breast self-exams. Let your Tammy care provider know about any changes, no matter how small.  If you are in your 20s or 30s, you should have a clinical breast exam (CBE) by a Tammy care provider every 1-3 years as part of a regular Tammy exam.  If you are 35 or older, have a CBE every year. Also consider having a breast X-ray (mammogram) every year.  If you have a family history of breast cancer, talk to your Tammy care provider about genetic screening.  If you are at high risk for breast cancer, talk to your Tammy care provider about having an MRI and a mammogram every year.  Breast cancer gene (BRCA) assessment is recommended for women who have family members with BRCA-related cancers. BRCA-related cancers include:  Breast.  Ovarian.  Tubal.  Peritoneal cancers.  Results of the assessment will determine the need for genetic counseling and BRCA1 and BRCA2 testing. Cervical Cancer Your Tammy care provider may recommend that you be screened regularly for cancer of the pelvic organs (ovaries, uterus, and vagina). This screening involves a pelvic examination, including checking for microscopic changes to the surface of your cervix (Pap test). You may be encouraged to have this screening done every 3  years, beginning at age 63.  For women ages 75-65, Tammy care providers may recommend pelvic exams and Pap testing every 3 years, or they may recommend the Pap and pelvic exam, combined with testing for human papilloma virus (HPV), every 5 years. Some types of HPV increase your risk of cervical cancer. Testing for HPV may also be done on women of any age with unclear Pap test results.  Other Tammy care providers may not recommend any screening for nonpregnant women who are considered low risk for pelvic cancer and who do not have symptoms. Ask your Tammy care provider if a screening pelvic exam is right for you.  If you have had past treatment for cervical cancer or a condition that could lead to cancer, you need Pap tests and screening for cancer for at least 20 years after your treatment. If  Pap tests have been discontinued, your risk factors (such as having a new sexual partner) need to be reassessed to determine if screening should resume. Some women have medical problems that increase the chance of getting cervical cancer. In these cases, your Tammy care provider may recommend more frequent screening and Pap tests. Colorectal Cancer  This type of cancer can be detected and often prevented.  Routine colorectal cancer screening usually begins at 74 years of age and continues through 74 years of age.  Your Tammy care provider may recommend screening at an earlier age if you have risk factors for colon cancer.  Your Tammy care provider may also recommend using home test kits to check for hidden blood in the stool.  A small camera at the end of a tube can be used to examine your colon directly (sigmoidoscopy or colonoscopy). This is done to check for the earliest forms of colorectal cancer.  Routine screening usually begins at age 36.  Direct examination of the colon should be repeated every 5-10 years through 74 years of age. However, you may need to be screened more often if early forms  of precancerous polyps or small growths are found. Skin Cancer  Check your skin from head to toe regularly.  Tell your Tammy care provider about any new moles or changes in moles, especially if there is a change in a mole's shape or color.  Also tell your Tammy care provider if you have a mole that is larger than the size of a pencil eraser.  Always use sunscreen. Apply sunscreen liberally and repeatedly throughout the day.  Protect yourself by wearing long sleeves, pants, a wide-brimmed hat, and sunglasses whenever you are outside. HEART DISEASE, DIABETES, AND HIGH BLOOD PRESSURE   High blood pressure causes heart disease and increases the risk of stroke. High blood pressure is more likely to develop in:  People who have blood pressure in the high end of the normal range (130-139/85-89 mm Hg).  People who are overweight or obese.  People who are African American.  If you are 53-34 years of age, have your blood pressure checked every 3-5 years. If you are 2 years of age or older, have your blood pressure checked every year. You should have your blood pressure measured twice--once when you are at a hospital or clinic, and once when you are not at a hospital or clinic. Record the average of the two measurements. To check your blood pressure when you are not at a hospital or clinic, you can use:  An automated blood pressure machine at a pharmacy.  A home blood pressure monitor.  If you are between 81 years and 26 years old, ask your Tammy care provider if you should take aspirin to prevent strokes.  Have regular diabetes screenings. This involves taking a blood sample to check your fasting blood sugar level.  If you are at a normal weight and have a low risk for diabetes, have this test once every three years after 74 years of age.  If you are overweight and have a high risk for diabetes, consider being tested at a younger age or more often. PREVENTING INFECTION  Hepatitis  B  If you have a higher risk for hepatitis B, you should be screened for this virus. You are considered at high risk for hepatitis B if:  You were born in a country where hepatitis B is common. Ask your Tammy care provider which countries are considered high risk.  Your parents  were born in a high-risk country, and you have not been immunized against hepatitis B (hepatitis B vaccine).  You have HIV or AIDS.  You use needles to inject street drugs.  You live with someone who has hepatitis B.  You have had sex with someone who has hepatitis B.  You get hemodialysis treatment.  You take certain medicines for conditions, including cancer, organ transplantation, and autoimmune conditions. Hepatitis C  Blood testing is recommended for:  Everyone born from [birth year] through 1965.  Anyone with known risk factors for hepatitis C. Sexually transmitted infections (STIs)  You should be screened for sexually transmitted infections (STIs) including gonorrhea and chlamydia if:  You are sexually active and are younger than 74 years of age.  You are older than 74 years of age and your Tammy care provider tells you that you are at risk for this type of infection.  Your sexual activity has changed since you were last screened and you are at an increased risk for chlamydia or gonorrhea. Ask your Tammy care provider if you are at risk.  If you do not have HIV, but are at risk, it may be recommended that you take a prescription medicine daily to prevent HIV infection. This is called pre-exposure prophylaxis (PrEP). You are considered at risk if:  You are sexually active and do not regularly use condoms or know the HIV status of your partner(s).  You take drugs by injection.  You are sexually active with a partner who has HIV. Talk with your Tammy care provider about whether you are at high risk of being infected with HIV. If you choose to begin PrEP, you should first be tested for HIV. You should  then be tested every 3 months for as long as you are taking PrEP.  PREGNANCY   If you are premenopausal and you may become pregnant, ask your Tammy care provider about preconception counseling.  If you may become pregnant, take 400 to 800 micrograms (mcg) of folic acid every day.  If you want to prevent pregnancy, talk to your Tammy care provider about birth control (contraception). OSTEOPOROSIS AND MENOPAUSE   Osteoporosis is a disease in which the bones lose minerals and strength with aging. This can result in serious bone fractures. Your risk for osteoporosis can be identified using a bone density scan.  If you are 31 years of age or older, or if you are at risk for osteoporosis and fractures, ask your Tammy care provider if you should be screened.  Ask your Tammy care provider whether you should take a calcium or vitamin D supplement to lower your risk for osteoporosis.  Menopause may have certain physical symptoms and risks.  Hormone replacement therapy may reduce some of these symptoms and risks. Talk to your Tammy care provider about whether hormone replacement therapy is right for you.  HOME CARE INSTRUCTIONS   Schedule regular Tammy, dental, and eye exams.  Stay current with your immunizations.   Do not use any tobacco products including cigarettes, chewing tobacco, or electronic cigarettes.  If you are pregnant, do not drink alcohol.  If you are breastfeeding, limit how much and how often you drink alcohol.  Limit alcohol intake to no more than 1 drink per day for nonpregnant women. One drink equals 12 ounces of beer, 5 ounces of wine, or 1 ounces of hard liquor.  Do not use street drugs.  Do not share needles.  Ask your Tammy care provider for help if you need support  or information about quitting drugs.  Tell your Tammy care provider if you often feel depressed.  Tell your Tammy care provider if you have ever been abused or do not feel safe at home.    This information is not intended to replace advice given to you by your Tammy care provider. Make sure you discuss any questions you have with your Tammy care provider.   Document Released: 11/25/2010 Document Revised: 06/02/2014 Document Reviewed: 04/13/2013 Elsevier Interactive Patient Education 2016 [employer].  Steps to Quit Smoking  Smoking tobacco can be harmful to your Tammy and can affect almost every organ in your body. Smoking puts you, and those around you, at risk for developing many serious chronic diseases. Quitting smoking is difficult, but it is one of the best things that you can do for your Tammy. It is never too late to quit. WHAT ARE THE BENEFITS OF QUITTING SMOKING? When you quit smoking, you lower your risk of developing serious diseases and conditions, such as:  Lung cancer or lung disease, such as COPD.  Heart disease.  Stroke.  Heart attack.  Infertility.  Osteoporosis and bone fractures. Additionally, symptoms such as coughing, wheezing, and shortness of breath may get better when you quit. You may also find that you get sick less often because your body is stronger at fighting off colds and infections. If you are pregnant, quitting smoking can help to reduce your chances of having a baby of low birth weight. HOW DO I GET READY TO QUIT? When you decide to quit smoking, create a plan to make sure that you are successful. Before you quit:  Pick a date to quit. Set a date within the next two weeks to give you time to prepare.  Write down the reasons why you are quitting. Keep this list in places where you will see it often, such as on your bathroom mirror or in your car or wallet.  Identify the people, places, things, and activities that make you want to smoke (triggers) and avoid them. Make sure to take these actions:  Throw away all cigarettes at home, at work, and in your car.  Throw away smoking accessories, such as Scientist, research (medical).  Clean  your car and make sure to empty the ashtray.  Clean your home, including curtains and carpets.  Tell your family, friends, and coworkers that you are quitting. Support from your loved ones can make quitting easier.  Talk with your Tammy care provider about your options for quitting smoking.  Find out what treatment options are covered by your Tammy insurance. WHAT STRATEGIES CAN I USE TO QUIT SMOKING?  Talk with your healthcare provider about different strategies to quit smoking. Some strategies include:  Quitting smoking altogether instead of gradually lessening how much you smoke over a period of time. Research shows that quitting "cold [place]" is more successful than gradually quitting.  Attending in-person counseling to help you build problem-solving skills. You are more likely to have success in quitting if you attend several counseling sessions. Even short sessions of 10 minutes can be effective.  Finding resources and support systems that can help you to quit smoking and remain smoke-free after you quit. These resources are most helpful when you use them often. They can include:  Online chats with a Social worker.  Telephone quitlines.  Printed Furniture conservator/restorer.  Support groups or group counseling.  Text messaging programs.  Mobile phone applications.  Taking medicines to help you quit smoking. (If you are pregnant  or breastfeeding, talk with your Tammy care provider first.) Some medicines contain nicotine and some do not. Both types of medicines help with cravings, but the medicines that include nicotine help to relieve withdrawal symptoms. Your Tammy care provider may recommend:  Nicotine patches, gum, or lozenges.  Nicotine inhalers or sprays.  Non-nicotine medicine that is taken by mouth. Talk with your Tammy care provider about combining strategies, such as taking medicines while you are also receiving in-person counseling. Using these two strategies together  makes you more likely to succeed in quitting than if you used either strategy on its own. If you are pregnant or breastfeeding, talk with your Tammy care provider about finding counseling or other support strategies to quit smoking. Do not take medicine to help you quit smoking unless told to do so by your Tammy care provider. WHAT THINGS CAN I DO TO MAKE IT EASIER TO QUIT? Quitting smoking might feel overwhelming at first, but there is a lot that you can do to make it easier. Take these important actions:  Reach out to your family and friends and ask that they support and encourage you during this time. Call telephone quitlines, reach out to support groups, or work with a counselor for support.  Ask people who smoke to avoid smoking around you.  Avoid places that trigger you to smoke, such as bars, parties, or smoke-break areas at work.  Spend time around people who do not smoke.  Lessen stress in your life, because stress can be a smoking trigger for some people. To lessen stress, try:  Exercising regularly.  Deep-breathing exercises.  Yoga.  Meditating.  Performing a body scan. This involves closing your eyes, scanning your body from head to toe, and noticing which parts of your body are particularly tense. Purposefully relax the muscles in those areas.  Download or purchase mobile phone or tablet apps (applications) that can help you stick to your quit plan by providing reminders, tips, and encouragement. There are many free apps, such as QuitGuide from the [employer] Office manager for Disease Control and Prevention). You can find other support for quitting smoking (smoking cessation) through smokefree.gov and other websites. HOW WILL I FEEL WHEN I QUIT SMOKING? Within the first 24 hours of quitting smoking, you may start to feel some withdrawal symptoms. These symptoms are usually most noticeable 2-3 days after quitting, but they usually do not last beyond 2-3 weeks. Changes or symptoms that  you might experience include:  Mood swings.  Restlessness, anxiety, or irritation.  Difficulty concentrating.  Dizziness.  Strong cravings for sugary foods in addition to nicotine.  Mild weight gain.  Constipation.  Nausea.  Coughing or a sore throat.  Changes in how your medicines work in your body.  A depressed mood.  Difficulty sleeping (insomnia). After the first 2-3 weeks of quitting, you may start to notice more positive results, such as:  Improved sense of smell and taste.  Decreased coughing and sore throat.  Slower heart rate.  Lower blood pressure.  Clearer skin.  The ability to breathe more easily.  Fewer sick days. Quitting smoking is very challenging for most people. Do not get discouraged if you are not successful the first time. Some people need to make many attempts to quit before they achieve long-term success. Do your best to stick to your quit plan, and talk with your Tammy care provider if you have any questions or concerns.   This information is not intended to replace advice given to you by  your Tammy care provider. Make sure you discuss any questions you have with your Tammy care provider.   Document Released: 05/06/2001 Document Revised: 09/26/2014 Document Reviewed: 09/26/2014 Elsevier Interactive Patient Education 2016 [place] in the Home  Falls can cause injuries. They can happen to people of all ages. There are many things you can do to make your home safe and to help prevent falls.  WHAT CAN I DO ON THE OUTSIDE OF MY HOME?  Regularly fix the edges of walkways and driveways and fix any cracks.  Remove anything that might make you trip as you walk through a door, such as a raised step or threshold.  Trim any bushes or trees on the path to your home.  Use bright outdoor lighting.  Clear any walking paths of anything that might make someone trip, such as rocks or tools.  Regularly check to see if  handrails are loose or broken. Make sure that both sides of any steps have handrails.  Any raised decks and porches should have guardrails on the edges.  Have any leaves, snow, or ice cleared regularly.  Use sand or salt on walking paths during winter.  Clean up any spills in your garage right away. This includes oil or grease spills. WHAT CAN I DO IN THE BATHROOM?   Use night lights.  Install grab bars by the toilet and in the tub and shower. Do not use towel bars as grab bars.  Use non-skid mats or decals in the tub or shower.  If you need to sit down in the shower, use a plastic, non-slip stool.  Keep the floor dry. Clean up any water that spills on the floor as soon as it happens.  Remove soap buildup in the tub or shower regularly.  Attach bath mats securely with double-sided non-slip rug tape.  Do not have throw rugs and other things on the floor that can make you trip. WHAT CAN I DO IN THE BEDROOM?  Use night lights.  Make sure that you have a light by your bed that is easy to reach.  Do not use any sheets or blankets that are too big for your bed. They should not hang down onto the floor.  Have a firm chair that has side arms. You can use this for support while you get dressed.  Do not have throw rugs and other things on the floor that can make you trip. WHAT CAN I DO IN THE KITCHEN?  Clean up any spills right away.  Avoid walking on wet floors.  Keep items that you use a lot in easy-to-reach places.  If you need to reach something above you, use a strong step stool that has a grab bar.  Keep electrical cords out of the way.  Do not use floor polish or wax that makes floors slippery. If you must use wax, use non-skid floor wax.  Do not have throw rugs and other things on the floor that can make you trip. WHAT CAN I DO WITH MY STAIRS?  Do not leave any items on the stairs.  Make sure that there are handrails on both sides of the stairs and use them. Fix  handrails that are broken or loose. Make sure that handrails are as long as the stairways.  Check any carpeting to make sure that it is firmly attached to the stairs. Fix any carpet that is loose or worn.  Avoid having throw rugs at the top or bottom of  the stairs. If you do have throw rugs, attach them to the floor with carpet tape.  Make sure that you have a light switch at the top of the stairs and the bottom of the stairs. If you do not have them, ask someone to add them for you. WHAT ELSE CAN I DO TO HELP PREVENT FALLS?  Wear shoes that:  Do not have high heels.  Have rubber bottoms.  Are comfortable and fit you well.  Are closed at the toe. Do not wear sandals.  If you use a stepladder:  Make sure that it is fully opened. Do not climb a closed stepladder.  Make sure that both sides of the stepladder are locked into place.  Ask someone to hold it for you, if possible.  Clearly mark and make sure that you can see:  Any grab bars or handrails.  First and last steps.  Where the edge of each step is.  Use tools that help you move around (mobility aids) if they are needed. These include:  Canes.  Walkers.  Scooters.  Crutches.  Turn on the lights when you go into a dark area. Replace any light bulbs as soon as they burn out.  Set up your furniture so you have a clear path. Avoid moving your furniture around.  If any of your floors are uneven, fix them.  If there are any pets around you, be aware of where they are.  Review your medicines with your doctor. Some medicines can make you feel dizzy. This can increase your chance of falling. Ask your doctor what other things that you can do to help prevent falls.   This information is not intended to replace advice given to you by your Tammy care provider. Make sure you discuss any questions you have with your Tammy care provider.   Document Released: 03/08/2009 Document Revised: 09/26/2014 Document Reviewed:  06/16/2014 Elsevier Interactive Patient Education 2016 Tammy Wolfe, Female Adopting a healthy lifestyle and getting preventive care can go a long way to promote Tammy and wellness. Talk with your Tammy care provider about what schedule of regular examinations is right for you. This is a good chance for you to check in with your provider about disease prevention and staying healthy. In between checkups, there are plenty of things you can do on your own. Experts have done a lot of research about which lifestyle changes and preventive measures are most likely to keep you healthy. Ask your Tammy care provider for more information. WEIGHT AND DIET  Eat a healthy diet  Be sure to include plenty of vegetables, fruits, low-fat dairy products, and lean protein.  Do not eat a lot of foods high in solid fats, added sugars, or salt.  Get regular exercise. This is one of the most important things you can do for your Tammy.  Most adults should exercise for at least 150 minutes each week. The exercise should increase your heart rate and make you sweat (moderate-intensity exercise).  Most adults should also do strengthening exercises at least twice a week. This is in addition to the moderate-intensity exercise.  Maintain a healthy weight  Body mass index (BMI) is a measurement that can be used to identify possible weight problems. It estimates body fat based on height and weight. Your Tammy care provider can help determine your BMI and help you achieve or maintain a healthy weight.  For females 41 years of age and older:   A BMI below 18.5  is considered underweight.  A BMI of 18.5 to 24.9 is normal.  A BMI of 25 to 29.9 is considered overweight.  A BMI of 30 and above is considered obese.  Watch levels of cholesterol and blood lipids  You should start having your blood tested for lipids and cholesterol at 74 years of age, then have this test every 5 years.  You may  need to have your cholesterol levels checked more often if:  Your lipid or cholesterol levels are high.  You are older than 74 years of age.  You are at high risk for heart disease.  CANCER SCREENING   Lung Cancer  Lung cancer screening is recommended for adults 39-11 years old who are at high risk for lung cancer because of a history of smoking.  A yearly low-dose CT scan of the lungs is recommended for people who:  Currently smoke.  Have quit within the past 15 years.  Have at least a 30-pack-year history of smoking. A pack year is smoking an average of one pack of cigarettes a day for 1 year.  Yearly screening should continue until it has been 15 years since you quit.  Yearly screening should stop if you develop a Tammy problem that would prevent you from having lung cancer treatment.  Breast Cancer  Practice breast self-awareness. This means understanding how your breasts normally appear and feel.  It also means doing regular breast self-exams. Let your Tammy care provider know about any changes, no matter how small.  If you are in your 20s or 30s, you should have a clinical breast exam (CBE) by a Tammy care provider every 1-3 years as part of a regular Tammy exam.  If you are 16 or older, have a CBE every year. Also consider having a breast X-ray (mammogram) every year.  If you have a family history of breast cancer, talk to your Tammy care provider about genetic screening.  If you are at high risk for breast cancer, talk to your Tammy care provider about having an MRI and a mammogram every year.  Breast cancer gene (BRCA) assessment is recommended for women who have family members with BRCA-related cancers. BRCA-related cancers include:  Breast.  Ovarian.  Tubal.  Peritoneal cancers.  Results of the assessment will determine the need for genetic counseling and BRCA1 and BRCA2 testing. Cervical Cancer Your Tammy care provider may recommend that you be  screened regularly for cancer of the pelvic organs (ovaries, uterus, and vagina). This screening involves a pelvic examination, including checking for microscopic changes to the surface of your cervix (Pap test). You may be encouraged to have this screening done every 3 years, beginning at age 41.  For women ages 33-65, Tammy care providers may recommend pelvic exams and Pap testing every 3 years, or they may recommend the Pap and pelvic exam, combined with testing for human papilloma virus (HPV), every 5 years. Some types of HPV increase your risk of cervical cancer. Testing for HPV may also be done on women of any age with unclear Pap test results.  Other Tammy care providers may not recommend any screening for nonpregnant women who are considered low risk for pelvic cancer and who do not have symptoms. Ask your Tammy care provider if a screening pelvic exam is right for you.  If you have had past treatment for cervical cancer or a condition that could lead to cancer, you need Pap tests and screening for cancer for at least 20 years after your  treatment. If Pap tests have been discontinued, your risk factors (such as having a new sexual partner) need to be reassessed to determine if screening should resume. Some women have medical problems that increase the chance of getting cervical cancer. In these cases, your Tammy care provider may recommend more frequent screening and Pap tests. Colorectal Cancer  This type of cancer can be detected and often prevented.  Routine colorectal cancer screening usually begins at 74 years of age and continues through 74 years of age.  Your Tammy care provider may recommend screening at an earlier age if you have risk factors for colon cancer.  Your Tammy care provider may also recommend using home test kits to check for hidden blood in the stool.  A small camera at the end of a tube can be used to examine your colon directly (sigmoidoscopy or colonoscopy).  This is done to check for the earliest forms of colorectal cancer.  Routine screening usually begins at age 80.  Direct examination of the colon should be repeated every 5-10 years through 74 years of age. However, you may need to be screened more often if early forms of precancerous polyps or small growths are found. Skin Cancer  Check your skin from head to toe regularly.  Tell your Tammy care provider about any new moles or changes in moles, especially if there is a change in a mole's shape or color.  Also tell your Tammy care provider if you have a mole that is larger than the size of a pencil eraser.  Always use sunscreen. Apply sunscreen liberally and repeatedly throughout the day.  Protect yourself by wearing long sleeves, pants, a wide-brimmed hat, and sunglasses whenever you are outside. HEART DISEASE, DIABETES, AND HIGH BLOOD PRESSURE   High blood pressure causes heart disease and increases the risk of stroke. High blood pressure is more likely to develop in:  People who have blood pressure in the high end of the normal range (130-139/85-89 mm Hg).  People who are overweight or obese.  People who are African American.  If you are 36-74 years of age, have your blood pressure checked every 3-5 years. If you are 33 years of age or older, have your blood pressure checked every year. You should have your blood pressure measured twice--once when you are at a hospital or clinic, and once when you are not at a hospital or clinic. Record the average of the two measurements. To check your blood pressure when you are not at a hospital or clinic, you can use:  An automated blood pressure machine at a pharmacy.  A home blood pressure monitor.  If you are between 24 years and 58 years old, ask your Tammy care provider if you should take aspirin to prevent strokes.  Have regular diabetes screenings. This involves taking a blood sample to check your fasting blood sugar level.  If you  are at a normal weight and have a low risk for diabetes, have this test once every three years after 74 years of age.  If you are overweight and have a high risk for diabetes, consider being tested at a younger age or more often. PREVENTING INFECTION  Hepatitis B  If you have a higher risk for hepatitis B, you should be screened for this virus. You are considered at high risk for hepatitis B if:  You were born in a country where hepatitis B is common. Ask your Tammy care provider which countries are considered high risk.  Your parents were born in a high-risk country, and you have not been immunized against hepatitis B (hepatitis B vaccine).  You have HIV or AIDS.  You use needles to inject street drugs.  You live with someone who has hepatitis B.  You have had sex with someone who has hepatitis B.  You get hemodialysis treatment.  You take certain medicines for conditions, including cancer, organ transplantation, and autoimmune conditions. Hepatitis C  Blood testing is recommended for:  Everyone born from [birth year] through 1965.  Anyone with known risk factors for hepatitis C. Sexually transmitted infections (STIs)  You should be screened for sexually transmitted infections (STIs) including gonorrhea and chlamydia if:  You are sexually active and are younger than 74 years of age.  You are older than 74 years of age and your Tammy care provider tells you that you are at risk for this type of infection.  Your sexual activity has changed since you were last screened and you are at an increased risk for chlamydia or gonorrhea. Ask your Tammy care provider if you are at risk.  If you do not have HIV, but are at risk, it may be recommended that you take a prescription medicine daily to prevent HIV infection. This is called pre-exposure prophylaxis (PrEP). You are considered at risk if:  You are sexually active and do not regularly use condoms or know the HIV status of your  partner(s).  You take drugs by injection.  You are sexually active with a partner who has HIV. Talk with your Tammy care provider about whether you are at high risk of being infected with HIV. If you choose to begin PrEP, you should first be tested for HIV. You should then be tested every 3 months for as long as you are taking PrEP.  PREGNANCY   If you are premenopausal and you may become pregnant, ask your Tammy care provider about preconception counseling.  If you may become pregnant, take 400 to 800 micrograms (mcg) of folic acid every day.  If you want to prevent pregnancy, talk to your Tammy care provider about birth control (contraception). OSTEOPOROSIS AND MENOPAUSE   Osteoporosis is a disease in which the bones lose minerals and strength with aging. This can result in serious bone fractures. Your risk for osteoporosis can be identified using a bone density scan.  If you are 68 years of age or older, or if you are at risk for osteoporosis and fractures, ask your Tammy care provider if you should be screened.  Ask your Tammy care provider whether you should take a calcium or vitamin D supplement to lower your risk for osteoporosis.  Menopause may have certain physical symptoms and risks.  Hormone replacement therapy may reduce some of these symptoms and risks. Talk to your Tammy care provider about whether hormone replacement therapy is right for you.  HOME CARE INSTRUCTIONS   Schedule regular Tammy, dental, and eye exams.  Stay current with your immunizations.   Do not use any tobacco products including cigarettes, chewing tobacco, or electronic cigarettes.  If you are pregnant, do not drink alcohol.  If you are breastfeeding, limit how much and how often you drink alcohol.  Limit alcohol intake to no more than 1 drink per day for nonpregnant women. One drink equals 12 ounces of beer, 5 ounces of wine, or 1 ounces of hard liquor.  Do not use street drugs.  Do  not share needles.  Ask your Tammy care provider for help if  you need support or information about quitting drugs.  Tell your Tammy care provider if you often feel depressed.  Tell your Tammy care provider if you have ever been abused or do not feel safe at home.   This information is not intended to replace advice given to you by your Tammy care provider. Make sure you discuss any questions you have with your Tammy care provider.   Document Released: 11/25/2010 Document Revised: 06/02/2014 Document Reviewed: 04/13/2013 Elsevier Interactive Patient Education [employer].

## 2015-03-14 NOTE — Assessment & Plan Note (Signed)
stable overall by history and exam, recent data reviewed with pt, and pt to continue medical treatment as before,  to f/u any worsening symptoms or concerns SpO2 Readings from Last 3 Encounters:  03/14/15 96%  12/12/14 100%  09/06/14 94%

## 2015-03-15 LAB — URINE CULTURE

## 2015-09-12 ENCOUNTER — Telehealth: Payer: Self-pay | Admitting: Internal Medicine

## 2015-09-12 NOTE — Telephone Encounter (Signed)
I would encourage OV if visit are open today or tomorrow

## 2015-09-12 NOTE — Telephone Encounter (Signed)
Pt called stated she might have a UTI, she was wondering if she can come to the lab and give a urin sample? Please call pt  # [phone]

## 2015-09-13 ENCOUNTER — Encounter: Payer: Self-pay | Admitting: Family

## 2015-09-13 ENCOUNTER — Ambulatory Visit (INDEPENDENT_AMBULATORY_CARE_PROVIDER_SITE_OTHER): Payer: Medicare Other | Admitting: Family

## 2015-09-13 ENCOUNTER — Other Ambulatory Visit: Payer: Self-pay

## 2015-09-13 VITALS — BP 118/60 | HR 109 | Temp 97.8°F | Resp 14 | Ht 60.0 in | Wt 89.0 lb

## 2015-09-13 DIAGNOSIS — R300 Dysuria: Secondary | ICD-10-CM | POA: Diagnosis not present

## 2015-09-13 LAB — POCT URINALYSIS DIPSTICK
BILIRUBIN UA: NEGATIVE
GLUCOSE UA: NEGATIVE
KETONES UA: NEGATIVE
Nitrite, UA: NEGATIVE
SPEC GRAV UA: 1.025
Urobilinogen, UA: NEGATIVE
pH, UA: 6

## 2015-09-13 MED ORDER — CEPHALEXIN 500 MG PO CAPS: 500.0000 mg | ORAL_CAPSULE | Freq: Four times a day (QID) | ORAL | Status: DC

## 2015-09-13 NOTE — Progress Notes (Signed)
Pre visit review using our clinic review tool, if applicable. No additional management support is needed unless otherwise documented below in the visit note. 

## 2015-09-13 NOTE — Assessment & Plan Note (Signed)
In office urinalysis positive for leukocytes and hematuria and negative for nitrites. Symptoms and exam consistent with urinary tract infection. Start Keflex. Encourage drink plenty of fluids. Urine sent for culture. Follow-up if symptoms worsen or do not improve

## 2015-09-13 NOTE — Progress Notes (Signed)
Subjective:    Patient ID: Tammy Wolfe, female    DOB: December 07, 1940, 75 y.o.   MRN: [id number]  Chief Complaint  Patient presents with  . Dysuria    dysuria and urinary frequency, x4 days    HPI:  Tammy Wolfe is a 75 y.o. female who  has a past medical history of ABNORMAL THYROID FUNCTION TESTS (12/10/2009); ALCOHOLIC HEPATITIS, HX OF (02/14/2007); ALLERGIC RHINITIS (02/14/2007); ANXIETY (03/19/2007); Breast implant removal status (12/23/2008); COLONIC POLYPS, HX OF (02/14/2007); GOITER, MULTINODULAR (02/14/2007); HX, PERSONAL, ALCOHOLISM (02/14/2007); HYPERLIPIDEMIA (02/14/2007); HYPERTENSION (02/14/2007); OSTEOARTHRITIS (02/14/2007); OSTEOPOROSIS (02/14/2007); PANCREATITIS, HX OF (02/14/2007); Anemia, unspecified (04/26/2012); Breast cancer ([place]); Insomnia; Smoker (09/06/2013); and Coronary artery calcification seen on CT scan (10/31/2013). and presents today For an acute office visit.  This is a new problem. Associated symptoms of dysuria, urgency and urinary frequency and a going on for proximally 4 days. Denies fevers or flank pain. Modifying factors include cranberry juice but that has not seemed to help. No recent antibiotics.   Allergies  Allergen Reactions  . Tetracycline Anaphylaxis     Current Outpatient Prescriptions on File Prior to Visit  Medication Sig Dispense Refill  . ALPRAZolam (XANAX) 0.25 MG tablet TAKE ONE TABLET THREE TIMES DAILY AS NEEDED 90 tablet 5  . Cholecalciferol (VITAMIN D) 1000 UNITS capsule Take 1,000 Units by mouth daily.      . cyanocobalamin ([facility] VITAMIN B12) 1000 MCG tablet Take 0.5 tablets (500 mcg total) by mouth daily. 30 tablet 4  . HYDROcodone-acetaminophen (NORCO/VICODIN) 5-325 MG tablet Take 1 tablet by mouth 2 (two) times daily as needed. 100 tablet 0  . losartan (COZAAR) 50 MG tablet Take 1 tablet (50 mg total) by mouth daily. 90 tablet 3  . lovastatin (MEVACOR) 40 MG tablet Take 1 tablet (40 mg total) by mouth daily. 90 tablet 3  . potassium chloride  (MICRO-K) 10 MEQ CR capsule Take 1 capsule (10 mEq total) by mouth 2 (two) times daily. 180 capsule 3  . tamoxifen (NOLVADEX) 20 MG tablet TAKE ONE TABLET BY MOUTH ONCE DAILY 90 tablet 3   No current facility-administered medications on file prior to visit.     Past Surgical History  Procedure Laterality Date  . Cataract extraction    . S/p jaw and periodontal surgury  2009  . Peridontal  2010  . Breast surgery  [id number]    silicone prepectoral implants explanted by Dr. Towanda Malkin after rupture  . Breast surgery      left simple mastectomy and left axillary sentinel node biopsy, right prophylactic mastectomy      Review of Systems  Constitutional: Negative for fever and chills.  Genitourinary: Positive for dysuria, urgency, frequency and hematuria. Negative for enuresis.      Objective:    BP 118/60 mmHg  Pulse 109  Temp(Src) 97.8 F (36.6 C) (Oral)  Resp 14  Ht 5' (1.524 m)  Wt 89 lb (40.37 kg)  BMI 17.38 kg/m2  SpO2 96% Nursing note and vital signs reviewed.  Physical Exam  Constitutional: She is oriented to person, place, and time. She appears well-developed and well-nourished. No distress.  Cardiovascular: Normal rate, regular rhythm, normal heart sounds and intact distal pulses.   Pulmonary/Chest: Effort normal and breath sounds normal.  Abdominal: There is CVA tenderness.  Neurological: She is alert and oriented to person, place, and time.  Skin: Skin is warm and dry.  Psychiatric: She has a normal mood and affect. Her behavior is normal. Judgment and thought content normal.  Assessment & Plan:   Problem List Items Addressed This Visit      Other   Dysuria - Primary    In office urinalysis positive for leukocytes and hematuria and negative for nitrites. Symptoms and exam consistent with urinary tract infection. Start Keflex. Encourage drink plenty of fluids. Urine sent for culture. Follow-up if symptoms worsen or do not improve      Relevant Medications    cephALEXin (KEFLEX) 500 MG capsule   Other Relevant Orders   POCT urinalysis dipstick (Completed)   Urine culture      I am having Ms. Adderly maintain her Vitamin D, cyanocobalamin, tamoxifen, losartan, lovastatin, potassium chloride, ALPRAZolam, HYDROcodone-acetaminophen, and cephALEXin.   Meds ordered this encounter  Medications  . cephALEXin (KEFLEX) 500 MG capsule    Sig: Take 1 capsule (500 mg total) by mouth 4 (four) times daily.    Dispense:  40 capsule    Refill:  0    Patient requests delivery.    Order Specific Question:  Supervising Provider    Answer:  Pricilla Holm A [id number]     Follow-up: Return if symptoms worsen or fail to improve.  Mauricio Po, FNP

## 2015-09-13 NOTE — Patient Instructions (Signed)
Thank you for choosing [facility].  Summary/Instructions:  Your prescription(s) have been submitted to your pharmacy or been printed and provided for you. Please take as directed and contact our office if you believe you are having problem(s) with the medication(s) or have any questions.  If your symptoms worsen or fail to improve, please contact our office for further instruction, or in case of emergency go directly to the emergency room at the closest medical facility.   Urinary Tract Infection Urinary tract infections (UTIs) can develop anywhere along your urinary tract. Your urinary tract is your body's drainage system for removing wastes and extra water. Your urinary tract includes two kidneys, two ureters, a bladder, and a urethra. Your kidneys are a pair of bean-shaped organs. Each kidney is about the size of your fist. They are located below your ribs, one on each side of your spine. CAUSES Infections are caused by microbes, which are microscopic organisms, including fungi, viruses, and bacteria. These organisms are so small that they can only be seen through a microscope. Bacteria are the microbes that most commonly cause UTIs. SYMPTOMS  Symptoms of UTIs may vary by age and gender of the patient and by the location of the infection. Symptoms in young women typically include a frequent and intense urge to urinate and a painful, burning feeling in the bladder or urethra during urination. Older women and men are more likely to be tired, shaky, and weak and have muscle aches and abdominal pain. A fever may mean the infection is in your kidneys. Other symptoms of a kidney infection include pain in your back or sides below the ribs, nausea, and vomiting. DIAGNOSIS To diagnose a UTI, your caregiver will ask you about your symptoms. Your caregiver will also ask you to provide a urine sample. The urine sample will be tested for bacteria and white blood cells. White blood cells are made by your  body to help fight infection. TREATMENT  Typically, UTIs can be treated with medication. Because most UTIs are caused by a bacterial infection, they usually can be treated with the use of antibiotics. The choice of antibiotic and length of treatment depend on your symptoms and the type of bacteria causing your infection. HOME CARE INSTRUCTIONS  If you were prescribed antibiotics, take them exactly as your caregiver instructs you. Finish the medication even if you feel better after you have only taken some of the medication.  Drink enough water and fluids to keep your urine clear or pale yellow.  Avoid caffeine, tea, and carbonated beverages. They tend to irritate your bladder.  Empty your bladder often. Avoid holding urine for long periods of time.  Empty your bladder before and after sexual intercourse.  After a bowel movement, women should cleanse from front to back. Use each tissue only once. SEEK MEDICAL CARE IF:   You have back pain.  You develop a fever.  Your symptoms do not begin to resolve within 3 days. SEEK IMMEDIATE MEDICAL CARE IF:   You have severe back pain or lower abdominal pain.  You develop chills.  You have nausea or vomiting.  You have continued burning or discomfort with urination. MAKE SURE YOU:   Understand these instructions.  Will watch your condition.  Will get help right away if you are not doing well or get worse.   This information is not intended to replace advice given to you by your health care provider. Make sure you discuss any questions you have with your health care   provider.   Document Released: 02/19/2005 Document Revised: 01/31/2015 Document Reviewed: 06/20/2011 Elsevier Interactive Patient Education 2016 Elsevier Inc.  

## 2015-09-15 LAB — URINE CULTURE: Colony Count: >=100000

## 2015-09-17 ENCOUNTER — Telehealth: Payer: Self-pay | Admitting: Family

## 2015-09-17 NOTE — Telephone Encounter (Signed)
Please inform patient that her urine culture was positive for infection. The antibiotic given was appropriate. Please follow up if symptoms worsen or return.

## 2015-09-18 NOTE — Telephone Encounter (Signed)
LVM letting pt know.  

## 2015-10-01 ENCOUNTER — Other Ambulatory Visit: Payer: Self-pay | Admitting: Internal Medicine

## 2015-10-01 NOTE — Telephone Encounter (Signed)
Please advise, thanks.

## 2015-10-02 NOTE — Telephone Encounter (Signed)
Medication faxed to pharmacy 

## 2015-10-02 NOTE — Telephone Encounter (Signed)
Done hardcopy to Corinne  

## 2015-10-10 ENCOUNTER — Ambulatory Visit (INDEPENDENT_AMBULATORY_CARE_PROVIDER_SITE_OTHER): Payer: Medicare Other | Admitting: Internal Medicine

## 2015-10-10 ENCOUNTER — Encounter: Payer: Self-pay | Admitting: Internal Medicine

## 2015-10-10 VITALS — BP 108/64 | HR 102 | Temp 97.8°F | Resp 12 | Ht 60.0 in | Wt 90.8 lb

## 2015-10-10 DIAGNOSIS — R309 Painful micturition, unspecified: Secondary | ICD-10-CM

## 2015-10-10 DIAGNOSIS — N390 Urinary tract infection, site not specified: Secondary | ICD-10-CM

## 2015-10-10 DIAGNOSIS — I10 Essential (primary) hypertension: Secondary | ICD-10-CM

## 2015-10-10 LAB — POCT URINALYSIS DIPSTICK
Bilirubin, UA: NEGATIVE
Glucose, UA: NEGATIVE
Ketones, UA: NEGATIVE
NITRITE UA: NEGATIVE
PH UA: 6
Spec Grav, UA: 1.015
UROBILINOGEN UA: 0.2

## 2015-10-10 MED ORDER — CEFTRIAXONE SODIUM 1 G IJ SOLR
1.0000 g | Freq: Once | INTRAMUSCULAR | Status: AC
  Administered 2015-10-10: 1 g via INTRAMUSCULAR

## 2015-10-10 MED ORDER — CIPROFLOXACIN HCL 250 MG PO TABS: 250.0000 mg | ORAL_TABLET | Freq: Two times a day (BID) | ORAL | Status: DC

## 2015-10-10 MED ORDER — LIDOCAINE HCL (PF) 1 % IJ SOLN: 0.9000 mL | Freq: Once | INTRAMUSCULAR | Status: DC

## 2015-10-10 NOTE — Progress Notes (Signed)
Pre visit review using our clinic review tool, if applicable. No additional management support is needed unless otherwise documented below in the visit note. 

## 2015-10-10 NOTE — Progress Notes (Signed)
Subjective:  Patient ID: Tammy Wolfe, female    DOB: [birth year]  Age: 75 y.o. MRN: [id number]  CC: Acute Visit   HPI Tammy Wolfe presents for UTI sx's x 4 d. Pt had a E coli UTI - took Keflex x 10 d - now is sick again  Outpatient Prescriptions Prior to Visit  Medication Sig Dispense Refill  . ALPRAZolam (XANAX) 0.25 MG tablet TAKE ONE TABLET THREE TIMES DAILY AS NEEDED 90 tablet 5  . Cholecalciferol (VITAMIN D) 1000 UNITS capsule Take 1,000 Units by mouth daily.      . cyanocobalamin ([facility] VITAMIN B12) 1000 MCG tablet Take 0.5 tablets (500 mcg total) by mouth daily. 30 tablet 4  . HYDROcodone-acetaminophen (NORCO/VICODIN) 5-325 MG tablet Take 1 tablet by mouth 2 (two) times daily as needed. 100 tablet 0  . losartan (COZAAR) 50 MG tablet Take 1 tablet (50 mg total) by mouth daily. 90 tablet 3  . lovastatin (MEVACOR) 40 MG tablet Take 1 tablet (40 mg total) by mouth daily. 90 tablet 3  . potassium chloride (MICRO-K) 10 MEQ CR capsule Take 1 capsule (10 mEq total) by mouth 2 (two) times daily. 180 capsule 3  . tamoxifen (NOLVADEX) 20 MG tablet TAKE ONE TABLET BY MOUTH ONCE DAILY 90 tablet 3  . cephALEXin (KEFLEX) 500 MG capsule Take 1 capsule (500 mg total) by mouth 4 (four) times daily. (Patient not taking: Reported on 10/10/2015) 40 capsule 0   No facility-administered medications prior to visit.    ROS Review of Systems  Constitutional: Positive for chills. Negative for activity change, appetite change, fatigue and unexpected weight change.  HENT: Negative for congestion, mouth sores and sinus pressure.   Eyes: Negative for visual disturbance.  Respiratory: Negative for cough and chest tightness.   Gastrointestinal: Positive for abdominal pain. Negative for nausea.  Genitourinary: Positive for dysuria, urgency and dyspareunia. Negative for frequency, difficulty urinating and vaginal pain.  Musculoskeletal: Positive for back pain. Negative for gait problem.  Skin: Negative for pallor  and rash.  Neurological: Negative for dizziness, tremors, weakness, numbness and headaches.  Psychiatric/Behavioral: Negative for confusion and sleep disturbance. The patient is not nervous/anxious.     Objective:  BP 108/64 mmHg  Pulse 102  Temp(Src) 97.8 F (36.6 C) (Oral)  Resp 12  Ht 5' (1.524 m)  Wt 90 lb 12.8 oz (41.187 kg)  BMI 17.73 kg/m2  SpO2 97%  BP Readings from Last 3 Encounters:  10/10/15 108/64  09/13/15 118/60  03/14/15 114/80    Wt Readings from Last 3 Encounters:  10/10/15 90 lb 12.8 oz (41.187 kg)  09/13/15 89 lb (40.37 kg)  03/14/15 94 lb 4 oz (42.752 kg)    Physical Exam  Constitutional: She appears well-developed. No distress.  HENT:  Head: Normocephalic.  Right Ear: External ear normal.  Left Ear: External ear normal.  Nose: Nose normal.  Mouth/Throat: Oropharynx is clear and moist.  Eyes: Conjunctivae are normal. Pupils are equal, round, and reactive to light. Right eye exhibits no discharge. Left eye exhibits no discharge.  Neck: Normal range of motion. Neck supple. No JVD present. No tracheal deviation present. No thyromegaly present.  Cardiovascular: Normal rate, regular rhythm and normal heart sounds.   Pulmonary/Chest: No stridor. No respiratory distress. She has no wheezes.  Abdominal: Soft. Bowel sounds are normal. She exhibits no distension and no mass. There is no tenderness. There is no rebound and no guarding.  Musculoskeletal: She exhibits no edema or tenderness.  Lymphadenopathy:  She has no cervical adenopathy.  Neurological: She displays normal reflexes. No cranial nerve deficit. She exhibits normal muscle tone. Coordination normal.  Skin: No rash noted. No erythema.  Psychiatric: She has a normal mood and affect. Her behavior is normal. Judgment and thought content normal.  NAD but having chills Non-toxic appearing  Lab Results  Component Value Date   WBC 8.6 03/14/2015   HGB 12.1 03/14/2015   HCT 36.8 03/14/2015   PLT  283.0 03/14/2015   GLUCOSE 106* 03/14/2015   CHOL 175 03/14/2015   TRIG 141.0 03/14/2015   HDL 51.10 03/14/2015   LDLCALC 96 03/14/2015   ALT 10 03/14/2015   AST 17 03/14/2015   NA 141 03/14/2015   K 3.8 03/14/2015   CL 112 03/14/2015   CREATININE 0.96 03/14/2015   BUN 22 03/14/2015   CO2 18* 03/14/2015   TSH 0.53 03/14/2015   HGBA1C 6.0 03/14/2015    No results found.  Assessment & Plan:   Tammy Wolfe was seen today for acute visit.  Diagnoses and all orders for this visit:  Painful urination -     POCT urinalysis dipstick   I am having Tammy Wolfe maintain her Vitamin D, cyanocobalamin, tamoxifen, losartan, lovastatin, potassium chloride, HYDROcodone-acetaminophen, cephALEXin, and ALPRAZolam.  No orders of the defined types were placed in this encounter.     Follow-up: No Follow-up on file.  Walker Kehr, MD

## 2015-10-10 NOTE — Assessment & Plan Note (Signed)
Losartan 

## 2015-10-10 NOTE — Assessment & Plan Note (Signed)
5/17 E coli UTI - took Keflex x 10 d - now is sick again Rocephin 1 g im Cipro 250 bid starting tomorrow F/u w/Dr Jenny Reichmann US renal

## 2015-10-24 ENCOUNTER — Ambulatory Visit
Admission: RE | Admit: 2015-10-24 | Discharge: 2015-10-24 | Disposition: A | Discharge status: Home / Self Care | Payer: Medicare Other | Source: Ambulatory Visit | Attending: Internal Medicine | Admitting: Internal Medicine

## 2015-10-24 DIAGNOSIS — N390 Urinary tract infection, site not specified: Secondary | ICD-10-CM | POA: Diagnosis not present

## 2015-12-10 ENCOUNTER — Other Ambulatory Visit: Payer: Self-pay | Admitting: *Deleted

## 2015-12-10 DIAGNOSIS — C50412 Malignant neoplasm of upper-outer quadrant of left female breast: Secondary | ICD-10-CM

## 2015-12-11 ENCOUNTER — Encounter: Payer: Self-pay | Admitting: Oncology

## 2015-12-11 ENCOUNTER — Other Ambulatory Visit (HOSPITAL_BASED_OUTPATIENT_CLINIC_OR_DEPARTMENT_OTHER): Payer: Medicare Other

## 2015-12-11 ENCOUNTER — Telehealth: Payer: Self-pay | Admitting: Oncology

## 2015-12-11 ENCOUNTER — Ambulatory Visit (HOSPITAL_BASED_OUTPATIENT_CLINIC_OR_DEPARTMENT_OTHER): Payer: Medicare Other | Admitting: Oncology

## 2015-12-11 ENCOUNTER — Ambulatory Visit (HOSPITAL_BASED_OUTPATIENT_CLINIC_OR_DEPARTMENT_OTHER): Payer: Medicare Other

## 2015-12-11 VITALS — BP 127/66 | HR 103 | Temp 97.8°F | Resp 18 | Ht 60.0 in | Wt 92.1 lb

## 2015-12-11 DIAGNOSIS — Z853 Personal history of malignant neoplasm of breast: Secondary | ICD-10-CM

## 2015-12-11 DIAGNOSIS — C50412 Malignant neoplasm of upper-outer quadrant of left female breast: Secondary | ICD-10-CM | POA: Diagnosis not present

## 2015-12-11 DIAGNOSIS — E876 Hypokalemia: Secondary | ICD-10-CM

## 2015-12-11 LAB — COMPREHENSIVE METABOLIC PANEL
ALT: <9 U/L (ref 0–55)
ANION GAP: 10 meq/L (ref 3–11)
AST: 14 U/L (ref 5–34)
Albumin: 3.4 g/dL — ABNORMAL LOW (ref 3.5–5.0)
Alkaline Phosphatase: 54 U/L (ref 40–150)
BUN: 17.3 mg/dL (ref 7.0–26.0)
CHLORIDE: 114 meq/L — AB (ref 98–109)
CO2: 17 meq/L — AB (ref 22–29)
Calcium: 8.4 mg/dL (ref 8.4–10.4)
Creatinine: 1.1 mg/dL (ref 0.6–1.1)
EGFR: 47 mL/min/{1.73_m2} — AB (ref >90)
GLUCOSE: 133 mg/dL (ref 70–140)
POTASSIUM: 4.4 meq/L (ref 3.5–5.1)
SODIUM: 140 meq/L (ref 136–145)
TOTAL PROTEIN: 6.4 g/dL (ref 6.4–8.3)

## 2015-12-11 LAB — CBC WITH DIFFERENTIAL/PLATELET
BASO%: 0.5 % (ref 0.0–2.0)
BASOS ABS: 0 10*3/uL (ref 0.0–0.1)
EOS ABS: 0.2 10*3/uL (ref 0.0–0.5)
EOS%: 1.8 % (ref 0.0–7.0)
HCT: 32.9 % — ABNORMAL LOW (ref 34.8–46.6)
HGB: 10.3 g/dL — ABNORMAL LOW (ref 11.6–15.9)
LYMPH%: 15.5 % (ref 14.0–49.7)
MCH: 25.2 pg (ref 25.1–34.0)
MCHC: 31.2 g/dL — AB (ref 31.5–36.0)
MCV: 80.8 fL (ref 79.5–101.0)
MONO#: 0.5 10*3/uL (ref 0.1–0.9)
MONO%: 5.2 % (ref 0.0–14.0)
NEUT#: 7.7 10*3/uL — ABNORMAL HIGH (ref 1.5–6.5)
NEUT%: 77 % — AB (ref 38.4–76.8)
PLATELETS: 166 10*3/uL (ref 145–400)
RBC: 4.08 10*6/uL (ref 3.70–5.45)
RDW: 16 % — ABNORMAL HIGH (ref 11.2–14.5)
WBC: 10 10*3/uL (ref 3.9–10.3)
lymph#: 1.6 10*3/uL (ref 0.9–3.3)

## 2015-12-11 LAB — URINALYSIS, MICROSCOPIC - CHCC
BILIRUBIN (URINE): NEGATIVE
Glucose: NEGATIVE mg/dL
KETONES: NEGATIVE mg/dL
Nitrite: NEGATIVE
PH: 6.5 (ref 4.6–8.0)
PROTEIN: NEGATIVE mg/dL
Specific Gravity, Urine: 1.005 (ref 1.003–1.035)
Urobilinogen, UR: 0.2 mg/dL (ref 0.2–1)

## 2015-12-11 MED ORDER — NITROFURANTOIN MONOHYD MACRO 100 MG PO CAPS: 100.0000 mg | ORAL_CAPSULE | Freq: Every day | ORAL | Status: DC

## 2015-12-11 MED ORDER — POTASSIUM CHLORIDE ER 10 MEQ PO CPCR: 10.0000 meq | ORAL_CAPSULE | Freq: Two times a day (BID) | ORAL | Status: DC

## 2015-12-11 NOTE — Telephone Encounter (Signed)
appt made and avs printed °

## 2015-12-11 NOTE — Progress Notes (Signed)
[facility]  [phone] [phone]  OFFICE PROGRESS NOTE   ID: NAZIYAH Wolfe   DOB: 1941-05-10  MR#: [id number]  JGG#:[id number]   PCP: Cathlean Cower, MD SURGEON:  Rolm Bookbinder, MD, Earlie Raveling MD  CHIEF COMPLAINT:  Hx of Left Breast Cancer  CURRENT TREATMENT:  completing 5 years of tamoxifen  BREAST CANCER HISTORY: From Dr. Eston Esters is New Patient Evaluation Note dated 12/20/2010:  "This is a very pleasant 75 year old woman who is here with her daughter, Tammy Wolfe, to discuss her recently diagnosed breast cancer. This woman has been in good health.  She is a [place] native.  She has undergone annual screening mammography.  She had a screening mammogram on 11/12/2010 which showed the presence of bilateral silicone implants as well as numerous dense hyperdense material.  BSGI was recommended because of this.  A BSGI was performed which showed a 1.4 cm focus of moderate intense activity upper outer quadrant at the 10 o'clock position on the left.  There was a 1.6 cm focus of isotope activity at the 3 o'clock position.  Bilateral ultrasounds were used to evaluate these areas.  An ultrasound did suggest the presence of a mass at the 11 o'clock position in the right breast.  Both these areas were biopsied on 11/20/2010 and pathology did show an unremarkable biopsy on the right; on the left there was an invasive ductal cancer grade 2-3 with LVI noted.  This was felt to be a grade 2-3 lesion, ER positive 100%, PR positive 98%, proliferative index 23%, HER-2 was not amplified with a ratio of 1.48.  The patient did undergo an MRI scan of both breasts.  Initially had a 1.2 x 1.2 x 0.7 cm mass left breast consistent with invasive ductal cancer; on the right side there was a 1.1 x 0.8 x 0.7 cm mass.  The patient elected to undergo bilateral mastectomies which she did on 12/10/2010.  On the left side there was invasive ductal cancer measuring 1.4 cm and was grade 1 of 3  with two negative sentinel lymph nodes.  On the right side there was a benign lymph node and no evidence of malignancy.  Surgical biopsies were all clear.  Once again the prognostic panel suggested a low proliferative index and non-amplified ratio of 1.48.  The patient has had an unremarkable postoperative course."    Her subsequent history is as detailed below.  INTERVAL HISTORY: Tammy Wolfe returns today for follow-up of her estrogen receptor positive breast cancer. She continues on tamoxifen, with excellent tolerance. She obtains it at a very good price.  REVIEW OF SYSTEMS: Tammy Wolfe has some leg cramps which she attributed to her anticholesterol medicine and so she stopped that. She is not hydrating herself aggressively however. She has problems with her dentures. She has had 2 recent urinary tract infections and tells me she has another one now. There is pain with urination. There is no fever or hematuria. She has back pain which is chronic. She takes Norco for this. She also has chronic anxiety. Aside from these issues a detailed review of systems today was noncontributory  PAST MEDICAL HISTORY: Past Medical History  Diagnosis Date  . ABNORMAL THYROID FUNCTION TESTS 12/10/2009  . ALCOHOLIC HEPATITIS, HX OF 02/14/2007  . ALLERGIC RHINITIS 02/14/2007  . ANXIETY 03/19/2007  . Breast implant removal status 12/23/2008  . COLONIC POLYPS, HX OF 02/14/2007  . GOITER, MULTINODULAR 02/14/2007  . HX, PERSONAL, ALCOHOLISM 02/14/2007  . HYPERLIPIDEMIA 02/14/2007  . HYPERTENSION 02/14/2007  .  OSTEOARTHRITIS 02/14/2007  . OSTEOPOROSIS 02/14/2007  . PANCREATITIS, HX OF 02/14/2007  . Anemia, unspecified 04/26/2012  . Breast cancer ([facility])   . Insomnia   . Smoker 09/06/2013  . Coronary artery calcification seen on CT scan 10/31/2013    PAST SURGICAL HISTORY: Past Surgical History  Procedure Laterality Date  . Cataract extraction    . S/p jaw and periodontal surgury  2009  . Peridontal  2010  . Breast surgery  2458     silicone prepectoral implants explanted by Dr. Towanda Malkin after rupture  . Breast surgery      left simple mastectomy and left axillary sentinel node biopsy, right prophylactic mastectomy    FAMILY HISTORY Family History  Problem Relation Age of Onset  . Adopted: Yes  . Hyperlipidemia Brother     GYNECOLOGIC HISTORY:  (Reviewed 10/19/2013) She is G3, P3 and she was on hormone replacement therapy for 15 years but discontinued in 2007.  Menarche at age 63.  SOCIAL HISTORY:  (Reviewed 10/19/2013) She has been divorced for 30 years after being married twice.  Currently lives alone.  [place] 67[place]  She has 3 children, two in [place] and one in [place].  She has 2 grandchildren, a grandson who is attending [employer]. infrequently comes for supper, and a granddaughter going to [employer], who frequently comes for lunch.   ADVANCED DIRECTIVES: In place HEALTH MAINTENANCE:  (Updated 10/19/2013) Social History  Substance Use Topics  . Smoking status: Current Every Day Smoker -- 0.50 packs/day    Types: Cigarettes  . Smokeless tobacco: Never Used     Comment: trying to quit; not as much since cold  . Alcohol Use: 0.0 oz/week    0 Standard drinks or equivalent per week     Comment: one glass/wine occasional/ no drinking now    Colonoscopy: The patient's last colonoscopy we have on record was in 2006 PAP:  04/25/2008 Bone density:  Last bone density scan was on 03/04/2011 which showed osteoporosis with T score of -2.9 Lipid panel: 03/08/2013/Dr. Jenny Reichmann   Allergies  Allergen Reactions  . Tetracycline Anaphylaxis    Current Outpatient Prescriptions  Medication Sig Dispense Refill  . ALPRAZolam (XANAX) 0.25 MG tablet TAKE ONE TABLET THREE TIMES DAILY AS NEEDED 90 tablet 5  . Cholecalciferol (VITAMIN D) 1000 UNITS capsule Take 1,000 Units by mouth daily.      . cyanocobalamin ([facility]S VITAMIN B12) 1000 MCG tablet Take 0.5 tablets (500 mcg  total) by mouth daily. 30 tablet 4  . HYDROcodone-acetaminophen (NORCO/VICODIN) 5-325 MG tablet Take 1 tablet by mouth 2 (two) times daily as needed. 100 tablet 0  . losartan (COZAAR) 50 MG tablet Take 1 tablet (50 mg total) by mouth daily. 90 tablet 3  . nitrofurantoin, macrocrystal-monohydrate, (MACROBID) 100 MG capsule Take 1 capsule (100 mg total) by mouth at bedtime. 30 capsule 0  . potassium chloride (MICRO-K) 10 MEQ CR capsule Take 1 capsule (10 mEq total) by mouth 2 (two) times daily. 180 capsule 3   Current Facility-Administered Medications  Medication Dose Route Frequency Provider Last Rate Last Dose  . lidocaine (PF) (XYLOCAINE) 1 % injection 0.9 mL  0.9 mL Other Once Cassandria Anger, MD        OBJECTIVE: Older a white woman who appears stated age 78 Vitals:   12/11/15 1011  BP: 127/66  Pulse: 103  Temp: 97.8 F (36.6 C)  Resp: 18     Body mass  index is 17.99 kg/(m^2).    ECOG FS:  1 Filed Weights   12/11/15 1011  Weight: 92 lb 1.6 oz (41.776 kg)   Sclerae unicteric, EOMs intact Oropharynx clear and moist No cervical or supraclavicular adenopathy Lungs no rales or rhonchi Heart regular rate and rhythm Abd soft, nontender, positive bowel sounds MSK no focal spinal tenderness, no upper extremity lymphedema Neuro: nonfocal, well oriented, appropriate affect Breasts: Status post bilateral mastectomies, with no evidence of chest wall recurrence. Both axillae are benign.  RESULTS: Lab Results  Component Value Date   WBC 10.0 12/11/2015   NEUTROABS 7.7* 12/11/2015   HGB 10.3* 12/11/2015   HCT 32.9* 12/11/2015   MCV 80.8 12/11/2015   PLT 166 12/11/2015      Chemistry      Component Value Date/Time   NA 140 12/11/2015 0952   NA 141 03/14/2015 1024   K 4.4 12/11/2015 0952   K 3.8 03/14/2015 1024   CL 112 03/14/2015 1024   CL 111* 03/19/2012 1019   CO2 17* 12/11/2015 0952   CO2 18* 03/14/2015 1024   BUN 17.3 12/11/2015 0952   BUN 22 03/14/2015 1024    CREATININE 1.1 12/11/2015 0952   CREATININE 0.96 03/14/2015 1024      Component Value Date/Time   CALCIUM 8.4 12/11/2015 0952   CALCIUM 9.1 03/14/2015 1024   ALKPHOS 54 12/11/2015 0952   ALKPHOS 48 03/14/2015 1024   AST 14 12/11/2015 0952   AST 17 03/14/2015 1024   ALT <9 12/11/2015 0952   ALT 10 03/14/2015 1024   BILITOT <0.30 12/11/2015 0952   BILITOT 0.3 03/14/2015 1024      STUDIES: No results found.  ASSESSMENT: 75 y.o. [place] woman:  1.  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  2.  Status post bilateral breast MRI on 11/18/2010 which noted a history of bilateral silicone breast implants removed in 2010 due to implant rupture.  The MRI also showed a left breast 1.2 cm mass at the 2:30 position, and a 1.1 cm intramammary lymph node on the right breast.    3.  Status post right breast needle core biopsy on 11/20/2010 which showed no evidence of malignancy.  4.  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 The patient started antiestrogen therapy with Tamoxifen in 11/2010, the plan being to continue for total of 5 years (until July 2017)  6.  Osteoporosis with T-2.9 score 03/04/2011 (at [facility])    PLAN: Tammy Wolfe is five years out from definitive surgery for her breast cancer with no evidence of disease recurrence. This is very favorable.  She is completing 5 years of tamoxifen. In her case I do not believe continuing an additional 5 years would make a significant difference. The likely reduction in risk of systemic recurrence would be in the 1% range.  Accordingly we are stopping tamoxifen at this time.  She has significant problems with recurrent urinary tract infections. We discussed referral to urology but she just wants to get a culture and start antibiotics. I am prescribing Macrobid for her to take 100 mg daily for 30 days. If she  continues to have problems I would strongly suggest that she see Dr. McDiarmid  Her MCV has dropped and she is moderately anemic. This is suggestive of arm deficiency.She tells me she had a colonoscopy 5 years ago under Dr. Catalina Antigua off. Possibly she should have a repeat workup for this. In the meantime she will consider resuming iron supplementation  At this point I feel comfortable releasing her to her primary care physician. As far as breast cancer is concerned all she will need is a yearly physician chest exam  We did discuss our survivorship program and she is very interested in participating. I have made her an appointment with our survivorship nurse practitioner for November and she will have lab work including iron studies prior to that visit.  I will be glad to see gray again at any point in the future if on when the need arises, but as of now we're making no further routine appointments for herwith me here     Chauncey Cruel, MD  12/11/2015, 10:43 AM

## 2015-12-13 ENCOUNTER — Encounter: Payer: Self-pay | Admitting: Oncology

## 2015-12-13 LAB — URINE CULTURE

## 2015-12-14 ENCOUNTER — Telehealth: Payer: Self-pay | Admitting: *Deleted

## 2015-12-14 MED ORDER — CIPROFLOXACIN HCL 500 MG PO TABS: 500.0000 mg | ORAL_TABLET | Freq: Two times a day (BID) | ORAL | Status: DC

## 2015-12-14 NOTE — Telephone Encounter (Signed)
Per urine culture MD requested pt to stop macrobid , and initiate cipro.  Discussed with pt who verbalized understanding.  Tammy Wolfe stated she would like to resume taking the macrobid at low dose post completing Cipro to retard any recurrence due to ongoing issue.  Of note she is taking cranberry extract as supplement for bladder benefit.

## 2015-12-24 ENCOUNTER — Other Ambulatory Visit: Payer: Self-pay | Admitting: Internal Medicine

## 2016-01-15 ENCOUNTER — Other Ambulatory Visit: Payer: Self-pay | Admitting: *Deleted

## 2016-01-15 DIAGNOSIS — R300 Dysuria: Secondary | ICD-10-CM

## 2016-01-15 DIAGNOSIS — N390 Urinary tract infection, site not specified: Secondary | ICD-10-CM

## 2016-01-15 DIAGNOSIS — C50412 Malignant neoplasm of upper-outer quadrant of left female breast: Secondary | ICD-10-CM

## 2016-01-15 MED ORDER — CIPROFLOXACIN HCL 500 MG PO TABS: 500.0000 mg | ORAL_TABLET | Freq: Two times a day (BID) | ORAL | 0 refills | Status: DC

## 2016-01-23 ENCOUNTER — Telehealth: Payer: Self-pay | Admitting: *Deleted

## 2016-01-23 NOTE — Telephone Encounter (Signed)
This RN faxed referral for Dr Matilde Sprang for urology.

## 2016-02-07 DIAGNOSIS — C50912 Malignant neoplasm of unspecified site of left female breast: Secondary | ICD-10-CM | POA: Diagnosis not present

## 2016-02-19 ENCOUNTER — Telehealth: Payer: Self-pay | Admitting: *Deleted

## 2016-02-19 ENCOUNTER — Encounter: Payer: Self-pay | Admitting: *Deleted

## 2016-02-19 NOTE — Telephone Encounter (Signed)
Patient called wanting to know if MD Magrinat can give her another refill for her Cipro (recurrent UTI). Patient will be seeing urologist October 17th and PCP October 18th.

## 2016-02-20 NOTE — Telephone Encounter (Signed)
"  I need to know what to do about my bladder.  I know I have a Urinary track infection.  I do not see Dr. Matilde Sprang until October 18 th.  Can Dr. Jana Hakim refill the Cipro?  Return number [phone]."

## 2016-02-21 DIAGNOSIS — N390 Urinary tract infection, site not specified: Secondary | ICD-10-CM | POA: Diagnosis not present

## 2016-02-21 NOTE — Telephone Encounter (Signed)
This RN attempted to reach pt per MD review with request to come in for urine culture due to concern for cystitis vs infection.  Per multiple calls - phone would ring and either directly disconnect or answering machine would partially pick up and then disconnect.

## 2016-02-22 DIAGNOSIS — N390 Urinary tract infection, site not specified: Secondary | ICD-10-CM | POA: Diagnosis not present

## 2016-03-11 ENCOUNTER — Telehealth: Payer: Self-pay

## 2016-03-11 NOTE — Telephone Encounter (Signed)
Call to reschedule apt; To see Dr. Jenny Reichmann. AWV will be rescheduled due to a health fair

## 2016-03-12 ENCOUNTER — Ambulatory Visit: Payer: Medicare Other

## 2016-03-12 ENCOUNTER — Other Ambulatory Visit (INDEPENDENT_AMBULATORY_CARE_PROVIDER_SITE_OTHER): Payer: Medicare Other

## 2016-03-12 ENCOUNTER — Ambulatory Visit (INDEPENDENT_AMBULATORY_CARE_PROVIDER_SITE_OTHER): Payer: Medicare Other | Admitting: Internal Medicine

## 2016-03-12 ENCOUNTER — Encounter: Payer: Self-pay | Admitting: Internal Medicine

## 2016-03-12 VITALS — BP 112/68 | HR 88 | Temp 98.0°F | Resp 20 | Wt 90.0 lb

## 2016-03-12 DIAGNOSIS — Z23 Encounter for immunization: Secondary | ICD-10-CM

## 2016-03-12 DIAGNOSIS — Z0001 Encounter for general adult medical examination with abnormal findings: Secondary | ICD-10-CM

## 2016-03-12 DIAGNOSIS — Z1211 Encounter for screening for malignant neoplasm of colon: Secondary | ICD-10-CM

## 2016-03-12 DIAGNOSIS — E876 Hypokalemia: Secondary | ICD-10-CM | POA: Diagnosis not present

## 2016-03-12 DIAGNOSIS — M545 Low back pain, unspecified: Secondary | ICD-10-CM | POA: Insufficient documentation

## 2016-03-12 DIAGNOSIS — I10 Essential (primary) hypertension: Secondary | ICD-10-CM

## 2016-03-12 DIAGNOSIS — R7302 Impaired glucose tolerance (oral): Secondary | ICD-10-CM

## 2016-03-12 DIAGNOSIS — R350 Frequency of micturition: Secondary | ICD-10-CM | POA: Diagnosis not present

## 2016-03-12 DIAGNOSIS — N390 Urinary tract infection, site not specified: Secondary | ICD-10-CM | POA: Diagnosis not present

## 2016-03-12 DIAGNOSIS — M810 Age-related osteoporosis without current pathological fracture: Secondary | ICD-10-CM

## 2016-03-12 DIAGNOSIS — G8929 Other chronic pain: Secondary | ICD-10-CM | POA: Insufficient documentation

## 2016-03-12 DIAGNOSIS — E785 Hyperlipidemia, unspecified: Secondary | ICD-10-CM

## 2016-03-12 DIAGNOSIS — R351 Nocturia: Secondary | ICD-10-CM | POA: Diagnosis not present

## 2016-03-12 LAB — BASIC METABOLIC PANEL
BUN: 19 mg/dL (ref 6–23)
CHLORIDE: 112 meq/L (ref 96–112)
CO2: 20 mEq/L (ref 19–32)
CREATININE: 0.95 mg/dL (ref 0.40–1.20)
Calcium: 9.2 mg/dL (ref 8.4–10.5)
GFR: 60.85 mL/min (ref >60.00)
Glucose, Bld: 100 mg/dL — ABNORMAL HIGH (ref 70–99)
POTASSIUM: 3.6 meq/L (ref 3.5–5.1)
Sodium: 140 mEq/L (ref 135–145)

## 2016-03-12 LAB — URINALYSIS, ROUTINE W REFLEX MICROSCOPIC
BILIRUBIN URINE: NEGATIVE
Ketones, ur: NEGATIVE
NITRITE: NEGATIVE
Specific Gravity, Urine: 1.01 (ref 1.000–1.030)
TOTAL PROTEIN, URINE-UPE24: NEGATIVE
Urine Glucose: NEGATIVE
Urobilinogen, UA: 0.2 (ref 0.0–1.0)
pH: 6 (ref 5.0–8.0)

## 2016-03-12 LAB — CBC WITH DIFFERENTIAL/PLATELET
BASOS PCT: 0.5 % (ref 0.0–3.0)
Basophils Absolute: 0 10*3/uL (ref 0.0–0.1)
EOS ABS: 0.1 10*3/uL (ref 0.0–0.7)
EOS PCT: 1.8 % (ref 0.0–5.0)
HEMATOCRIT: 34.7 % — AB (ref 36.0–46.0)
Hemoglobin: 11.2 g/dL — ABNORMAL LOW (ref 12.0–15.0)
LYMPHS PCT: 22 % (ref 12.0–46.0)
Lymphs Abs: 1.7 10*3/uL (ref 0.7–4.0)
MCHC: 32.4 g/dL (ref 30.0–36.0)
MCV: 79.4 fl (ref 78.0–100.0)
Monocytes Absolute: 0.6 10*3/uL (ref 0.1–1.0)
Monocytes Relative: 7.1 % (ref 3.0–12.0)
NEUTROS ABS: 5.3 10*3/uL (ref 1.4–7.7)
Neutrophils Relative %: 68.6 % (ref 43.0–77.0)
PLATELETS: 348 10*3/uL (ref 150.0–400.0)
RBC: 4.37 Mil/uL (ref 3.87–5.11)
RDW: 17.2 % — AB (ref 11.5–15.5)
WBC: 7.8 10*3/uL (ref 4.0–10.5)

## 2016-03-12 LAB — LIPID PANEL
CHOL/HDL RATIO: 3
Cholesterol: 178 mg/dL (ref 0–200)
HDL: 56 mg/dL (ref >39.00)
LDL CALC: 101 mg/dL — AB (ref 0–99)
NONHDL: 122.35
Triglycerides: 106 mg/dL (ref 0.0–149.0)
VLDL: 21.2 mg/dL (ref 0.0–40.0)

## 2016-03-12 LAB — HEPATIC FUNCTION PANEL
ALT: 13 U/L (ref 0–35)
AST: 18 U/L (ref 0–37)
Albumin: 4.2 g/dL (ref 3.5–5.2)
Alkaline Phosphatase: 54 U/L (ref 39–117)
BILIRUBIN DIRECT: 0.1 mg/dL (ref 0.0–0.3)
BILIRUBIN TOTAL: 0.4 mg/dL (ref 0.2–1.2)
Total Protein: 7.4 g/dL (ref 6.0–8.3)

## 2016-03-12 LAB — TSH: TSH: 0.63 u[IU]/mL (ref 0.35–4.50)

## 2016-03-12 LAB — HEMOGLOBIN A1C: Hgb A1c MFr Bld: 5.6 % (ref 4.6–6.5)

## 2016-03-12 MED ORDER — POTASSIUM CHLORIDE ER 10 MEQ PO CPCR
10.0000 meq | ORAL_CAPSULE | Freq: Every day | ORAL | 3 refills | Status: DC
Start: 2016-03-12 — End: 2017-03-26

## 2016-03-12 MED ORDER — HYDROCODONE-ACETAMINOPHEN 5-325 MG PO TABS: 1.0000 | ORAL_TABLET | Freq: Two times a day (BID) | ORAL | 0 refills | Status: DC | PRN

## 2016-03-12 MED ORDER — POTASSIUM CHLORIDE ER 10 MEQ PO CPCR: 10.0000 meq | ORAL_CAPSULE | Freq: Every day | ORAL | 3 refills | Status: DC

## 2016-03-12 MED ORDER — PRAVASTATIN SODIUM 20 MG PO TABS: 20.0000 mg | ORAL_TABLET | Freq: Every day | ORAL | 3 refills | Status: DC

## 2016-03-12 MED ORDER — LOSARTAN POTASSIUM 50 MG PO TABS: 50.0000 mg | ORAL_TABLET | Freq: Every day | ORAL | 3 refills | Status: DC

## 2016-03-12 NOTE — Progress Notes (Signed)
Pre visit review using our clinic review tool, if applicable. No additional management support is needed unless otherwise documented below in the visit note. 

## 2016-03-12 NOTE — Progress Notes (Signed)
Subjective:    Patient ID: Tammy Wolfe, female    DOB: 09-Aug-1940, 75 y.o.   MRN: [id number]  HPI  Here for wellness and f/u;  Overall doing ok;  Pt denies Chest pain, worsening SOB, DOE, wheezing, orthopnea, PND, worsening LE edema, palpitations, dizziness or syncope.  Pt denies neurological change such as new headache, facial or extremity weakness.  Pt denies polydipsia, polyuria, or low sugar symptoms. Pt states overall good compliance with treatment and medications, good tolerability, and has been trying to follow appropriate diet.  Pt denies worsening depressive symptoms, suicidal ideation or panic. No fever, night sweats, wt loss, loss of appetite, or other constitutional symptoms.  Pt states good ability with ADL's, has low fall risk, home safety reviewed and adequate, no other significant changes in hearing or vision, and only occasionally active with exercise. Due for flu shot and dxa.    Pt continues to have recurring LBP without change in severity, bowel or bladder change, fever, wt loss,  worsening LE pain/numbness/weakness, gait change or falls - due for her yearly #100 vicodin that lasts 1 yr.  Does not abuse or divert.  Walking and gardening always seem to help keep away the back pain so much.  Since April has also had several UTI - cephalexin did not work well, but cipro worked ok.  Renal u/s neg for acute. Also tx per Dr Jana Hakim with another UTI, referred to urology, so today with appt with Dr Vikki Ports.  Has not taken oral otc iron for 2 wks due ot nausea, and has been focused on tx for uti for now. Only taking one K per day as well.  Not taking statin due to cramps that resolved with stopping since April 2017  For start low dose CT lung Ca screening;  Per CMS guidelines, I have determined the eligibility including patient age (55-80), and absence of any signs of lung cancer.  Specific calculation of number of pack years is documented.  Quit smoking years is documented.  Shared decision  making engaged today, including a discussion of the benefits and harms of screening, discussion of need for followup with additional testing, risks of over-diagnosis, risk of false-positive screening examinations, and risk of radiation exposure.  Counseling done today on the importance of adherence to annual lunge cancer LDCT screening, importance of quit smoking and remaining quit, and tobacco cessation instructions given.  There is also discussion with patient regarding the impact of comorbidities, and patient states is able and willing to undergo diagnosis and treatment.   Past Medical History:  Diagnosis Date  . ABNORMAL THYROID FUNCTION TESTS 12/10/2009  . ALCOHOLIC HEPATITIS, HX OF 02/14/2007  . ALLERGIC RHINITIS 02/14/2007  . Anemia, unspecified 04/26/2012  . ANXIETY 03/19/2007  . Breast cancer ([place])   . Breast implant removal status 12/23/2008  . COLONIC POLYPS, HX OF 02/14/2007  . Coronary artery calcification seen on CT scan 10/31/2013  . GOITER, MULTINODULAR 02/14/2007  . HX, PERSONAL, ALCOHOLISM 02/14/2007  . HYPERLIPIDEMIA 02/14/2007  . HYPERTENSION 02/14/2007  . Insomnia   . OSTEOARTHRITIS 02/14/2007  . OSTEOPOROSIS 02/14/2007  . PANCREATITIS, HX OF 02/14/2007  . Smoker 09/06/2013   Past Surgical History:  Procedure Laterality Date  . BREAST SURGERY  [id number]   silicone prepectoral implants explanted by Dr. Towanda Malkin after rupture  . BREAST SURGERY     left simple mastectomy and left axillary sentinel node biopsy, right prophylactic mastectomy  . CATARACT EXTRACTION    . peridontal  2010  . s/p  jaw and periodontal surgury  2009    reports that she has been smoking Cigarettes.  She has been smoking about 0.50 packs per day. She has never used smokeless tobacco. She reports that she drinks alcohol. She reports that she does not use drugs. family history includes Hyperlipidemia in her brother. She was adopted. Allergies  Allergen Reactions  . Tetracycline Anaphylaxis   Current  Outpatient Prescriptions on File Prior to Visit  Medication Sig Dispense Refill  . ALPRAZolam (XANAX) 0.25 MG tablet TAKE ONE TABLET THREE TIMES DAILY AS NEEDED 90 tablet 5  . Cholecalciferol (VITAMIN D) 1000 UNITS capsule Take 1,000 Units by mouth daily.      . cyanocobalamin ([employer]S VITAMIN B12) 1000 MCG tablet Take 0.5 tablets (500 mcg total) by mouth daily. 30 tablet 4  . losartan (COZAAR) 50 MG tablet TAKE ONE TABLET BY MOUTH ONCE DAILY 90 tablet 0  . nitrofurantoin, macrocrystal-monohydrate, (MACROBID) 100 MG capsule Take 1 capsule (100 mg total) by mouth at bedtime. 30 capsule 0  . potassium chloride (MICRO-K) 10 MEQ CR capsule Take 1 capsule (10 mEq total) by mouth 2 (two) times daily. 180 capsule 3   Current Facility-Administered Medications on File Prior to Visit  Medication Dose Route Frequency Provider Last Rate Last Dose  . lidocaine (PF) (XYLOCAINE) 1 % injection 0.9 mL  0.9 mL Other Once Cassandria Anger, MD       Review of Systems Constitutional: Negative for increased diaphoresis, or other activity, appetite or siginficant weight change other than noted HENT: Negative for worsening hearing loss, ear pain, facial swelling, mouth sores and neck stiffness.   Eyes: Negative for other worsening pain, redness or visual disturbance.  Respiratory: Negative for choking or stridor Cardiovascular: Negative for other chest pain and palpitations.  Gastrointestinal: Negative for worsening diarrhea, blood in stool, or abdominal distention Genitourinary: Negative for hematuria, flank pain or change in urine volume.  Musculoskeletal: Negative for myalgias or other joint complaints.  Skin: Negative for other color change and wound or drainage.  Neurological: Negative for syncope and numbness. other than noted Hematological: Negative for adenopathy. or other swelling Psychiatric/Behavioral: Negative for hallucinations, SI, self-injury, decreased concentration or other worsening agitation.       Objective:   Physical Exam BP 112/68   Pulse 88   Temp 98 F (36.7 C) (Oral)   Resp 20   Wt 90 lb (40.8 kg)   SpO2 96%   BMI 17.58 kg/m  VS noted,  Constitutional: Pt is oriented to person, place, and time. Appears well-developed and well-nourished, in no significant distress Head: Normocephalic and atraumatic  Eyes: Conjunctivae and EOM are normal. Pupils are equal, round, and reactive to light Right Ear: External ear normal.  Left Ear: External ear normal Nose: Nose normal.  Mouth/Throat: Oropharynx is clear and moist  Neck: Normal range of motion. Neck supple. No JVD present. No tracheal deviation present or significant neck LA or mass Cardiovascular: Normal rate, regular rhythm, normal heart sounds and intact distal pulses.   Pulmonary/Chest: Effort normal and breath sounds without rales or wheezing  Abdominal: Soft. Bowel sounds are normal. NT. No HSM  Musculoskeletal: Normal range of motion. Exhibits no edema Lymphadenopathy: Has no cervical adenopathy.  Neurological: Pt is alert and oriented to person, place, and time. Pt has normal reflexes. No cranial nerve deficit. Motor grossly intact Skin: Skin is warm and dry. No rash noted or new ulcers Psychiatric:  Has nervous mood and affect. Behavior is normal.  Assessment & Plan:

## 2016-03-12 NOTE — Patient Instructions (Addendum)
You had the flu shot today  Please take all new medication as prescribed - the low dose pravastatin 20 mg per day  OK to continue the potassium at one per day, pending labs today  Please continue all other medications as before, and refills have been done if requested.  Please have the pharmacy call with any other refills you may need.  Please continue your efforts at being more active, low cholesterol diet, and weight control.  You are otherwise up to date with prevention measures today.  Please keep your appointments with your specialists as you may have planned - urology later today  Please stop smoking  You will be contacted regarding the referral for: Low Dose CT scan of the Chest for lung cancer screening (you may wish to call your insurance to see if you have a copay for this)  Please go to the LAB in the Basement (turn left off the elevator) for the tests to be done today  You will be contacted by phone if any changes need to be made immediately.  Otherwise, you will receive a letter about your results with an explanation, but please check with MyChart first.  Please remember to sign up for MyChart if you have not done so, as this will be important to you in the future with finding out test results, communicating by private email, and scheduling acute appointments online when needed.  Please return in 1 year for your yearly visit, or sooner if needed, with Lab testing done 3-5 days before

## 2016-03-14 ENCOUNTER — Other Ambulatory Visit: Payer: Self-pay | Admitting: Internal Medicine

## 2016-03-14 NOTE — Telephone Encounter (Signed)
Opened in error

## 2016-03-16 NOTE — Assessment & Plan Note (Signed)
stable overall by history and exam, recent data reviewed with pt, and pt to continue medical treatment as before,  to f/u any worsening symptoms or concerns Lab Results  Component Value Date   HGBA1C 5.6 03/12/2016

## 2016-03-16 NOTE — Assessment & Plan Note (Signed)
For f/u dxa,  to f/u any worsening symptoms or concerns  

## 2016-03-16 NOTE — Assessment & Plan Note (Signed)

## 2016-03-16 NOTE — Assessment & Plan Note (Addendum)
Chronic, stable overall by history and exam, and pt to continue medical treatment as before,  to f/u any worsening symptoms or concerns  In addition to the time spent performing CPE, I spent an additional 25 minutes face to face,in which greater than 50% of this time was spent in counseling and coordination of care for patient's acute illness as documented.

## 2016-03-16 NOTE — Assessment & Plan Note (Signed)
Ok for 1 qd

## 2016-03-16 NOTE — Assessment & Plan Note (Signed)
Ok for change pravastatin asd for better control, to f/u any worsening symptoms or concerns Lab Results  Component Value Date   LDLCALC 101 (H) 03/12/2016

## 2016-03-16 NOTE — Assessment & Plan Note (Signed)
stable overall by history and exam, recent data reviewed with pt, and pt to continue medical treatment as before,  to f/u any worsening symptoms or concerns BP Readings from Last 3 Encounters:  03/12/16 112/68  12/11/15 127/66  10/10/15 108/64

## 2016-03-18 ENCOUNTER — Other Ambulatory Visit: Payer: Self-pay | Admitting: Internal Medicine

## 2016-03-18 DIAGNOSIS — F17200 Nicotine dependence, unspecified, uncomplicated: Secondary | ICD-10-CM

## 2016-04-03 ENCOUNTER — Other Ambulatory Visit (HOSPITAL_BASED_OUTPATIENT_CLINIC_OR_DEPARTMENT_OTHER): Payer: Medicare Other

## 2016-04-03 DIAGNOSIS — Z853 Personal history of malignant neoplasm of breast: Secondary | ICD-10-CM | POA: Diagnosis not present

## 2016-04-03 DIAGNOSIS — M8588 Other specified disorders of bone density and structure, other site: Secondary | ICD-10-CM | POA: Diagnosis not present

## 2016-04-03 DIAGNOSIS — C50412 Malignant neoplasm of upper-outer quadrant of left female breast: Secondary | ICD-10-CM

## 2016-04-03 LAB — CBC & DIFF AND RETIC
BASO%: 0.2 % (ref 0.0–2.0)
BASOS ABS: 0 10*3/uL (ref 0.0–0.1)
EOS ABS: 0.1 10*3/uL (ref 0.0–0.5)
EOS%: 1.3 % (ref 0.0–7.0)
HEMATOCRIT: 35.6 % (ref 34.8–46.6)
HEMOGLOBIN: 11.3 g/dL — AB (ref 11.6–15.9)
Immature Retic Fract: 6.1 % (ref 1.60–10.00)
LYMPH%: 22.6 % (ref 14.0–49.7)
MCH: 25.5 pg (ref 25.1–34.0)
MCHC: 31.7 g/dL (ref 31.5–36.0)
MCV: 80.4 fL (ref 79.5–101.0)
MONO#: 0.5 10*3/uL (ref 0.1–0.9)
MONO%: 5.6 % (ref 0.0–14.0)
NEUT#: 6.1 10*3/uL (ref 1.5–6.5)
NEUT%: 70.3 % (ref 38.4–76.8)
Platelets: 310 10*3/uL (ref 145–400)
RBC: 4.43 10*6/uL (ref 3.70–5.45)
RDW: 15.4 % — AB (ref 11.2–14.5)
RETIC %: 0.95 % (ref 0.70–2.10)
RETIC CT ABS: 42.09 10*3/uL (ref 33.70–90.70)
WBC: 8.7 10*3/uL (ref 3.9–10.3)
lymph#: 2 10*3/uL (ref 0.9–3.3)

## 2016-04-03 LAB — IRON AND TIBC
%SAT: 27 % (ref 21–57)
Iron: 74 ug/dL (ref 41–142)
TIBC: 278 ug/dL (ref 236–444)
UIBC: 204 ug/dL (ref 120–384)

## 2016-04-03 LAB — FERRITIN: Ferritin: 80 ng/ml (ref 9–269)

## 2016-04-10 ENCOUNTER — Encounter: Payer: Self-pay | Admitting: Adult Health

## 2016-04-10 ENCOUNTER — Ambulatory Visit (HOSPITAL_BASED_OUTPATIENT_CLINIC_OR_DEPARTMENT_OTHER): Payer: Medicare Other | Admitting: Adult Health

## 2016-04-10 VITALS — BP 118/57 | HR 90 | Temp 98.0°F | Resp 18 | Ht 60.0 in | Wt 90.0 lb

## 2016-04-10 DIAGNOSIS — Z720 Tobacco use: Secondary | ICD-10-CM

## 2016-04-10 DIAGNOSIS — Z170 Estrogen receptor positive status [ER+]: Secondary | ICD-10-CM | POA: Diagnosis not present

## 2016-04-10 DIAGNOSIS — D509 Iron deficiency anemia, unspecified: Secondary | ICD-10-CM | POA: Diagnosis not present

## 2016-04-10 DIAGNOSIS — Z853 Personal history of malignant neoplasm of breast: Secondary | ICD-10-CM | POA: Diagnosis not present

## 2016-04-10 DIAGNOSIS — C50412 Malignant neoplasm of upper-outer quadrant of left female breast: Secondary | ICD-10-CM

## 2016-04-10 NOTE — Progress Notes (Signed)
CLINIC:  Survivorship   REASON FOR VISIT:  Routine follow-up for history of breast cancer.   BRIEF ONCOLOGIC HISTORY:  (From Dr. Virgie Dad last note on 12/11/15)    INTERVAL HISTORY:  Tammy Wolfe presents to the [facility] today for routine follow-up for her history of breast cancer.  Overall, she reports feeling pretty well since her last visit to see Dr. Jana Hakim in 11/2015.  She continues to have chronic arthritis and back pain, which she relates to her history of osteoporosis; this pain is not worse than previous. She continues to have intermittent anxiety, which she manages with Xanax as needed.  She was referred to Dr. Matilde Sprang at [facility] for recurrent UTIs.  She tells me she had cystoscopy at that time and he did not note any structural abnormalities. She tells me that he started her on low-dose Bactrim, which she will take once daily for the next year. She has not had any recurrent UTI symptoms, which she is very grateful for. She tells me that Dr. Matilde Sprang wants to see her every 6 weeks for continued evaluation.  At her last visit with Dr. Jana Hakim, they discussed restarting her oral iron supplementation. She tells me that she occasionally takes the iron, but it is very difficult for her to be able to tolerate due to nausea. She is curious to see what her last studies will show today.  She continues to smoke cigarettes, but is actively working on quitting. She is now smoking about 4 cigarettes per day.   She continues to sees her breast surgeon, Dr. Donne Hazel, once a year. She tells me she saw him about 6 months ago.    REVIEW OF SYSTEMS:  Review of Systems  Constitutional: Negative.  Negative for fatigue.  HENT:  Negative.   Eyes: Negative.   Respiratory:       She has some dyspnea on exertion, but this is not concerning for her.  Cardiovascular: Negative.   Gastrointestinal: Positive for nausea. Negative for blood in stool.       She has nausea only  when she takes iron supplements.  Endocrine: Negative.   Genitourinary: Negative for dysuria, hematuria and vaginal bleeding.   Musculoskeletal: Positive for arthralgias and back pain.  Skin: Negative.   Neurological: Negative.   Hematological: Negative.   Psychiatric/Behavioral: The patient is nervous/anxious.   Breast/chest wall: Denies any new nodularity, masses, tenderness, nipple changes, or nipple discharge.    A 14-point review of systems was completed and was negative, except as noted above.    PAST MEDICAL/SURGICAL HISTORY:  Past Medical History:  Diagnosis Date  . ABNORMAL THYROID FUNCTION TESTS 12/10/2009  . ALCOHOLIC HEPATITIS, HX OF 02/14/2007  . ALLERGIC RHINITIS 02/14/2007  . Anemia, unspecified 04/26/2012  . ANXIETY 03/19/2007  . Breast cancer ([place])   . Breast implant removal status 12/23/2008  . COLONIC POLYPS, HX OF 02/14/2007  . Coronary artery calcification seen on CT scan 10/31/2013  . GOITER, MULTINODULAR 02/14/2007  . HX, PERSONAL, ALCOHOLISM 02/14/2007  . HYPERLIPIDEMIA 02/14/2007  . HYPERTENSION 02/14/2007  . Insomnia   . OSTEOARTHRITIS 02/14/2007  . OSTEOPOROSIS 02/14/2007  . PANCREATITIS, HX OF 02/14/2007  . Smoker 09/06/2013   Past Surgical History:  Procedure Laterality Date  . BREAST SURGERY  [id number]   silicone prepectoral implants explanted by Dr. Towanda Malkin after rupture  . BREAST SURGERY     left simple mastectomy and left axillary sentinel node biopsy, right prophylactic mastectomy  . CATARACT EXTRACTION    .  peridontal  2010  . s/p jaw and periodontal surgury  2009     ALLERGIES:  Allergies  Allergen Reactions  . Tetracycline Anaphylaxis  . Lovastatin Other (See Comments)    Muscle cramps     CURRENT MEDICATIONS:  Outpatient Encounter Prescriptions as of 04/10/2016  Medication Sig  . ALPRAZolam (XANAX) 0.25 MG tablet TAKE ONE TABLET THREE TIMES DAILY AS NEEDED  . Cholecalciferol (VITAMIN D) 1000 UNITS capsule Take 1,000 Units by mouth  daily.    . cyanocobalamin ([employer]S VITAMIN B12) 1000 MCG tablet Take 0.5 tablets (500 mcg total) by mouth daily.  Marland Kitchen HYDROcodone-acetaminophen (NORCO/VICODIN) 5-325 MG tablet Take 1 tablet by mouth 2 (two) times daily as needed.  Marland Kitchen losartan (COZAAR) 50 MG tablet Take 1 tablet (50 mg total) by mouth daily.  . nitrofurantoin, macrocrystal-monohydrate, (MACROBID) 100 MG capsule Take 1 capsule (100 mg total) by mouth at bedtime.  . potassium chloride (MICRO-K) 10 MEQ CR capsule Take 1 capsule (10 mEq total) by mouth daily.  . pravastatin (PRAVACHOL) 20 MG tablet Take 1 tablet (20 mg total) by mouth daily.   Facility-Administered Encounter Medications as of 04/10/2016  Medication  . lidocaine (PF) (XYLOCAINE) 1 % injection 0.9 mL     ONCOLOGIC FAMILY HISTORY:  Family History  Problem Relation Age of Onset  . Adopted: Yes  . Hyperlipidemia Brother     GENETIC COUNSELING/TESTING: No records available for review.   SOCIAL HISTORY:  Tammy Wolfe is single and lives alone in [place], [place]. She has 3 adult children and 2 grandchildren.  She enjoys cooking for her grandchildren, who live in [place], and visit her often.  She is retired. She denies any current illicit drug use.  She drinks alcohol occasionally.  Currently smokes 4 cigarettes/day (down from 1 pack per day for about 55 years); actively working on quitting smoking at present.     PHYSICAL EXAMINATION:  Vital Signs: Vitals:   04/10/16 0919  BP: (!) 118/57  Pulse: 90  Resp: 18  Temp: 98 F (36.7 C)   Filed Weights   04/10/16 0919  Weight: 90 lb (40.8 kg)    General: Thin female in no acute distress.  Unaccompanied today.   HEENT: Head is normocephalic.  Pupils equal and reactive to light. Conjunctivae clear without exudate.  Sclerae anicteric. Oral mucosa is pink, moist.  Oropharynx is pink without lesions or erythema.  Lymph: No cervical, supraclavicular, or infraclavicular lymphadenopathy noted on palpation.    Cardiovascular: Regular rate and rhythm.Marland Kitchen Respiratory: Clear to auscultation bilaterally. Chest expansion symmetric; breathing non-labored.  Breast Exam:  -Left breast: s/p mastectomy without reconstruction. No appreciable masses or nodularity on palpation. No skin redness, thickening, or peau d'orange appearance; healed scar without erythema or nodularity.  -Right breast: s/p mastectomy without reconstruction. No appreciable masses or nodularity on palpation. No skin redness, thickening, or peau d'orange appearance; healed scar without erythema or nodularity.  -Axilla: No axillary adenopathy bilaterally.  GI: Abdomen soft and flat; non-tender, non-distended. Bowel sounds normoactive. No hepatosplenomegaly.   GU: Deferred.  Neuro/MSK: No focal deficits. Steady gait.  (+) Kyphosis.  Psych: Mood and affect normal and appropriate for situation.  Extremities: No edema. Skin: Warm and dry.  LABORATORY DATA:  CBC Latest Ref Rng & Units 04/03/2016 03/12/2016 12/11/2015  WBC 3.9 - 10.3 10e3/uL 8.7 7.8 10.0  Hemoglobin 11.6 - 15.9 g/dL 11.3(L) 11.2(L) 10.3(L)  Hematocrit 34.8 - 46.6 % 35.6 34.7(L) 32.9(L)  Platelets 145 - 400 10e3/uL 310 348.0 166  CMP Latest Ref Rng & Units 03/12/2016 12/11/2015 03/14/2015  Glucose 70 - 99 mg/dL 100(H) 133 106(H)  BUN 6 - 23 mg/dL 19 17.3 22  Creatinine 0.40 - 1.20 mg/dL 0.95 1.1 0.96  Sodium 135 - 145 mEq/L 140 140 141  Potassium 3.5 - 5.1 mEq/L 3.6 4.4 3.8  Chloride 96 - 112 mEq/L 112 - 112  CO2 19 - 32 mEq/L 20 17(L) 18(L)  Calcium 8.4 - 10.5 mg/dL 9.2 8.4 9.1  Total Protein 6.0 - 8.3 g/dL 7.4 6.4 7.3  Total Bilirubin 0.2 - 1.2 mg/dL 0.4 <0.30 0.3  Alkaline Phos 39 - 117 U/L 54 54 48  AST 0 - 37 U/L 18 14 17   ALT 0 - 35 U/L 13 <9 10     Results for ESTELA, VINAL (MRN [id number]) as of 04/10/2016   Ref. Range 04/03/2016 11:10  Iron Latest Ref Range: 41 - 142 ug/dL 74  UIBC Latest Ref Range: 120 - 384 ug/dL 204  TIBC Latest Ref Range: 236 - 444 ug/dL  278  %SAT Latest Ref Range: 21 - 57 % 27  Ferritin Latest Ref Range: 9 - 269 ng/ml 80     DIAGNOSTIC IMAGING:  Most recent mammogram: None (bilateral mastectomies)     ASSESSMENT AND PLAN:  Ms.. Clowdus is a pleasant 75 y.o. female with history of Stage IA left breast invasive ductal carcinoma, ER+/PR+/HER2-, diagnosed in 10/2010;  treated with bilateral mastectomies with reconstruction and anti-estrogen therapy with Tamoxifen for a total of 5 years from 11/2010-11/2015.  She presents to the [facility]orship [facility] for surveillance and routine follow-up.   1. History of Stage IA left breast cancer:  Ms. Brackeen is currently clinically without evidence of disease or recurrence of breast cancer. She completed 5 years of tamoxifen in 11/2015. We briefly reviewed that extension of tamoxifen therapy to 10 years would likely only give her an approximate 1% decreased risk of reduction, per Dr. Virgie Dad last discussion with the patient.  The decision was made at that time not to continue Tamoxifen. She really finds great comfort in being seen at the cancer center annually and we can certainly continue to accommodate that for her in survivorship.  She will return to the cancer center to see Survivorship NP in 03/2017.  No mammogram orders placed given bilateral mastectomies.  I encouraged her to call me with any questions or concerns before her next visit at the cancer center, and I would be happy to see her sooner, if needed.    2. Iron deficiency anemia: Her anemia is slightly improved from 4 months ago.  Her hemoglobin is up 1 point, to 11.3 today, which is encouraging.  Her total iron is 74, ferritin 80, and UIBC 204, which indicates mild iron deficiency anemia.  I do not think she needs aggressive iron repletion with IV iron at this time.  We discussed restarting her oral iron supplements. Her biggest concern with this is nausea, which is certainly a common side effect of oral iron.  Although the absorption of oral  iron supplementation is slightly decreased when taken with food, I would rather she take the supplement with food than not at all.  I recommended that she take the iron supplementation with either orange juice or vitamin C supplement to increase absorption. She has also modified her diet to include more foods rich in iron, which I encouraged her to continue.  We will recheck iron studies next year to assess stability, at which point we will stop  checking routine labs since her PCP also collects labs.  She agreed with this plan.   3. Tobacco use disorder: I spent greater than 10 minutes counseling patient on smoking cessation.  Her biggest triggers for tobacco use are being at her home. She reports that she does not smoke when she is out with friends, as they do not smoke.  She does not smoke inside her house, and since the weather is turning colder, she is less likely to go outside and smoke.  She has tried nicotine patches in the past for about 2 weeks, but did not like the way they made her feel.  She has never tried Chantix, but is reluctant to try it because she is concerned "it will make me crazy and make my anxiety worse."  We discussed that one strategy that may help reduce any cravings would be to try the nicotine gum or lozenges, which act quickly to reduce withdrawal symptoms.  Since she is only smoking about 4 cigarettes/day, she is well on her way to quitting.  We discussed different strategies to help further decrease her tobacco use.  She is planning to reduce her daily smoking amount each month (i.e.-next month she will only smoke 3 cigarettes/day).  She prefers to do her own method of cessation versus using any medications or other resources.  I did recommend that she actually pick a "Quit Date."  We discussed that having a quit date on a calendar is a helpful reminder of her continued efforts to stop smoking.  It can be several months from now, but having an actual date where patients are  committed to not smoking 1 more cigarette past that date has been shown to be very effective in cessation efforts.  I encouraged her to let me know if he needs any additional help/resources in her efforts and commended the work she has already done in cutting down on her tobacco use.    4. Bone health:  Given Ms. Bernath's age & history of breast cancer, she is at risk for bone demineralization. Her last DEXA scan was on 03/04/2011 and showed osteoporosis. I will defer to her PCP, who previously ordered her last DEXA scan to manage her osteoporosis, as they deem clinically appropriate.  In the meantime, she was encouraged to increase her consumption of foods rich in calcium, as well as increase her weight-bearing activities.  She was given education on specific food and activities to promote bone health.  5. Health maintenance and wellness promotion: Ms. Frieson was encouraged to consume 5-7 servings of fruits and vegetables per day. She was also encouraged to engage in moderate to vigorous exercise for 30 minutes per day most days of the week. She is walking regularly, which I encouraged her to continue to do.   She was instructed to limit her alcohol consumption and was encouraged stop smoking.       Dispo:  -Return to cancer center to see Survivorship NP in 03/2017 with repeat labs, including iron panel.    A total of 35 minutes of face-to-face time was spent with this patient with greater than 50% of that time in counseling and care-coordination.   Mike Craze, NP Survivorship Program [place] [phone]   Note: PRIMARY CARE PROVIDER Cathlean Cower, [place] [phone]

## 2016-04-11 ENCOUNTER — Telehealth: Payer: Self-pay

## 2016-04-11 MED ORDER — ALPRAZOLAM 0.25 MG PO TABS: ORAL_TABLET | ORAL | 5 refills | Status: DC

## 2016-04-11 NOTE — Telephone Encounter (Signed)
Done Done hardcopy to Corinne  

## 2016-04-11 NOTE — Telephone Encounter (Signed)
rf rq for alprazolam.   Last filled on 03/06/2016.  pls advise

## 2016-04-15 NOTE — Telephone Encounter (Signed)
faxed

## 2016-04-16 DIAGNOSIS — H524 Presbyopia: Secondary | ICD-10-CM | POA: Diagnosis not present

## 2016-04-23 DIAGNOSIS — R350 Frequency of micturition: Secondary | ICD-10-CM | POA: Diagnosis not present

## 2016-06-10 ENCOUNTER — Inpatient Hospital Stay: Admission: RE | Admit: 2016-06-10 | Payer: Medicare Other | Source: Ambulatory Visit

## 2016-07-18 ENCOUNTER — Telehealth: Payer: Self-pay | Admitting: Acute Care

## 2016-07-18 NOTE — Telephone Encounter (Signed)
LMTC x 1 for pt.  Lung screening referral is no linger in [facility]que.  Does pt still want to scheduled SDMV and CT?

## 2016-08-07 NOTE — Telephone Encounter (Signed)
LMTC x 1  

## 2016-08-18 NOTE — Telephone Encounter (Signed)
LMOM for pt.  Due to multiple unsuccessful attempts to contact pt to scheduled [facility] referral will now be cancelled.  Letter sent to Dr Gwynn Burly office to notify that new referral will need to be placed if he would still like pt to be seen.  Letter also sent to pt.

## 2016-10-27 ENCOUNTER — Other Ambulatory Visit: Payer: Self-pay | Admitting: Internal Medicine

## 2016-10-28 NOTE — Telephone Encounter (Signed)
faxed

## 2016-10-28 NOTE — Telephone Encounter (Signed)
Done hardcopy to Shirron  

## 2017-03-12 DIAGNOSIS — C50912 Malignant neoplasm of unspecified site of left female breast: Secondary | ICD-10-CM | POA: Diagnosis not present

## 2017-03-17 ENCOUNTER — Encounter: Payer: Medicare Other | Admitting: Internal Medicine

## 2017-03-26 ENCOUNTER — Encounter: Payer: Self-pay | Admitting: Internal Medicine

## 2017-03-26 ENCOUNTER — Other Ambulatory Visit (INDEPENDENT_AMBULATORY_CARE_PROVIDER_SITE_OTHER): Payer: Medicare Other

## 2017-03-26 ENCOUNTER — Ambulatory Visit (INDEPENDENT_AMBULATORY_CARE_PROVIDER_SITE_OTHER): Payer: Medicare Other | Admitting: Internal Medicine

## 2017-03-26 ENCOUNTER — Other Ambulatory Visit: Payer: Medicare Other

## 2017-03-26 ENCOUNTER — Telehealth: Payer: Self-pay

## 2017-03-26 VITALS — BP 122/76 | HR 110 | Temp 98.0°F | Ht 60.0 in | Wt 91.0 lb

## 2017-03-26 DIAGNOSIS — R7302 Impaired glucose tolerance (oral): Secondary | ICD-10-CM | POA: Diagnosis not present

## 2017-03-26 DIAGNOSIS — E876 Hypokalemia: Secondary | ICD-10-CM

## 2017-03-26 DIAGNOSIS — Z0000 Encounter for general adult medical examination without abnormal findings: Secondary | ICD-10-CM | POA: Diagnosis not present

## 2017-03-26 DIAGNOSIS — Z23 Encounter for immunization: Secondary | ICD-10-CM | POA: Diagnosis not present

## 2017-03-26 LAB — CBC WITH DIFFERENTIAL/PLATELET
BASOS ABS: 0 10*3/uL (ref 0.0–0.1)
Basophils Relative: 0.5 % (ref 0.0–3.0)
Eosinophils Absolute: 0.1 10*3/uL (ref 0.0–0.7)
Eosinophils Relative: 0.9 % (ref 0.0–5.0)
HCT: 37.6 % (ref 36.0–46.0)
Hemoglobin: 12 g/dL (ref 12.0–15.0)
LYMPHS ABS: 2 10*3/uL (ref 0.7–4.0)
Lymphocytes Relative: 21.6 % (ref 12.0–46.0)
MCHC: 31.9 g/dL (ref 30.0–36.0)
MCV: 81.3 fl (ref 78.0–100.0)
MONO ABS: 0.4 10*3/uL (ref 0.1–1.0)
Monocytes Relative: 4.8 % (ref 3.0–12.0)
NEUTROS ABS: 6.8 10*3/uL (ref 1.4–7.7)
NEUTROS PCT: 72.2 % (ref 43.0–77.0)
PLATELETS: 334 10*3/uL (ref 150.0–400.0)
RBC: 4.62 Mil/uL (ref 3.87–5.11)
RDW: 15.8 % — ABNORMAL HIGH (ref 11.5–15.5)
WBC: 9.4 10*3/uL (ref 4.0–10.5)

## 2017-03-26 LAB — URINALYSIS, ROUTINE W REFLEX MICROSCOPIC
Bilirubin Urine: NEGATIVE
KETONES UR: NEGATIVE
Nitrite: NEGATIVE
PH: 6.5 (ref 5.0–8.0)
SPECIFIC GRAVITY, URINE: 1.01 (ref 1.000–1.030)
Total Protein, Urine: NEGATIVE
UROBILINOGEN UA: 0.2 (ref 0.0–1.0)
Urine Glucose: NEGATIVE

## 2017-03-26 LAB — BASIC METABOLIC PANEL
BUN: 23 mg/dL (ref 6–23)
CALCIUM: 9.4 mg/dL (ref 8.4–10.5)
CO2: 21 mEq/L (ref 19–32)
CREATININE: 1.14 mg/dL (ref 0.40–1.20)
Chloride: 110 mEq/L (ref 96–112)
GFR: 49.17 mL/min — AB (ref >60.00)
Glucose, Bld: 98 mg/dL (ref 70–99)
Potassium: 3.9 mEq/L (ref 3.5–5.1)
Sodium: 140 mEq/L (ref 135–145)

## 2017-03-26 LAB — HEPATIC FUNCTION PANEL
ALBUMIN: 4.2 g/dL (ref 3.5–5.2)
ALK PHOS: 61 U/L (ref 39–117)
ALT: 11 U/L (ref 0–35)
AST: 17 U/L (ref 0–37)
BILIRUBIN TOTAL: 0.3 mg/dL (ref 0.2–1.2)
Bilirubin, Direct: 0 mg/dL (ref 0.0–0.3)
TOTAL PROTEIN: 7.4 g/dL (ref 6.0–8.3)

## 2017-03-26 LAB — LIPID PANEL
CHOL/HDL RATIO: 3
Cholesterol: 182 mg/dL (ref 0–200)
HDL: 59.2 mg/dL (ref >39.00)
LDL CALC: 98 mg/dL (ref 0–99)
NONHDL: 122.42
TRIGLYCERIDES: 120 mg/dL (ref 0.0–149.0)
VLDL: 24 mg/dL (ref 0.0–40.0)

## 2017-03-26 LAB — TSH: TSH: 0.58 u[IU]/mL (ref 0.35–4.50)

## 2017-03-26 LAB — HEMOGLOBIN A1C: HEMOGLOBIN A1C: 6 % (ref 4.6–6.5)

## 2017-03-26 MED ORDER — POTASSIUM CHLORIDE ER 10 MEQ PO CPCR: 10.0000 meq | ORAL_CAPSULE | Freq: Every day | ORAL | 3 refills | Status: DC

## 2017-03-26 MED ORDER — HYDROCODONE-ACETAMINOPHEN 5-325 MG PO TABS: 1.0000 | ORAL_TABLET | Freq: Two times a day (BID) | ORAL | 0 refills | Status: DC | PRN

## 2017-03-26 MED ORDER — ALPRAZOLAM 0.25 MG PO TABS: ORAL_TABLET | ORAL | 5 refills | Status: DC

## 2017-03-26 MED ORDER — LOSARTAN POTASSIUM 50 MG PO TABS: 50.0000 mg | ORAL_TABLET | Freq: Every day | ORAL | 3 refills | Status: DC

## 2017-03-26 MED ORDER — PRAVASTATIN SODIUM 20 MG PO TABS: 20.0000 mg | ORAL_TABLET | Freq: Every day | ORAL | 3 refills | Status: DC

## 2017-03-26 NOTE — Telephone Encounter (Signed)
-----   Message from Biagio Borg, MD sent at 03/26/2017  1:17 PM EDT ----- Regarding: new prolia Please check in to new Prolia start for this pt

## 2017-03-26 NOTE — Progress Notes (Signed)
Subjective:    Patient ID: Tammy Wolfe, female    DOB: 1940/06/24, 76 y.o.   MRN: [id number]  HPI    Here for wellness and f/u;  Overall doing ok;  Pt denies Chest pain, worsening SOB, DOE, wheezing, orthopnea, PND, worsening LE edema, palpitations, dizziness or syncope.  Pt denies neurological change such as new headache, facial or extremity weakness.  Pt denies polydipsia, polyuria, or low sugar symptoms. Pt states overall good compliance with treatment and medications, good tolerability, and has been trying to follow appropriate diet.  Pt denies worsening depressive symptoms, suicidal ideation or panic. No fever, night sweats, wt loss, loss of appetite, or other constitutional symptoms.  Pt states good ability with ADL's, has low fall risk, home safety reviewed and adequate, no other significant changes in hearing or vision, and only occasionally active with exercise.  Doing very well on the trimethoprim, no UTI in the past yr after start per urology, Dr McDermid.  Has appt next wk in f/u.  Cannot tolerate the statins due to leg cramps.Pt continues to have recurring LBP without change in severity, bowel or bladder change, fever, wt loss,  worsening LE pain/numbness/weakness, gait change or falls, but still needs about #100/yr vicodin prn, asks for refill for flares not covered with advil. No other interval hx Past Medical History:  Diagnosis Date  . ABNORMAL THYROID FUNCTION TESTS 12/10/2009  . ALCOHOLIC HEPATITIS, HX OF 02/14/2007  . ALLERGIC RHINITIS 02/14/2007  . Anemia, unspecified 04/26/2012  . ANXIETY 03/19/2007  . Breast cancer ([place])   . Breast implant removal status 12/23/2008  . COLONIC POLYPS, HX OF 02/14/2007  . Coronary artery calcification seen on CT scan 10/31/2013  . GOITER, MULTINODULAR 02/14/2007  . HX, PERSONAL, ALCOHOLISM 02/14/2007  . HYPERLIPIDEMIA 02/14/2007  . HYPERTENSION 02/14/2007  . Insomnia   . OSTEOARTHRITIS 02/14/2007  . OSTEOPOROSIS 02/14/2007  . PANCREATITIS, HX OF  02/14/2007  . Smoker 09/06/2013   Past Surgical History:  Procedure Laterality Date  . BREAST SURGERY  6967   silicone prepectoral implants explanted by Dr. Towanda Malkin after rupture  . BREAST SURGERY     left simple mastectomy and left axillary sentinel node biopsy, right prophylactic mastectomy  . CATARACT EXTRACTION    . peridontal  2010  . s/p jaw and periodontal surgury  2009    reports that she has been smoking Cigarettes.  She has been smoking about 0.50 packs per day. She has never used smokeless tobacco. She reports that she drinks alcohol. She reports that she does not use drugs. family history includes Hyperlipidemia in her brother. She was adopted. Allergies  Allergen Reactions  . Tetracycline Anaphylaxis  . Lovastatin Other (See Comments)    Muscle cramps  . Pravastatin Other (See Comments)    Leg cramps   Current Outpatient Prescriptions on File Prior to Visit  Medication Sig Dispense Refill  . Cholecalciferol (VITAMIN D) 1000 UNITS capsule Take 1,000 Units by mouth daily.      . cyanocobalamin (CVS VITAMIN B12) 1000 MCG tablet Take 0.5 tablets (500 mcg total) by mouth daily. 30 tablet 4  . trimethoprim (TRIMPEX) 100 MG tablet Take 100 mg by mouth daily.     Current Facility-Administered Medications on File Prior to Visit  Medication Dose Route Frequency Provider Last Rate Last Dose  . lidocaine (PF) (XYLOCAINE) 1 % injection 0.9 mL  0.9 mL Other Once Plotnikov, Evie Lacks, MD       Review of Systems Constitutional: Negative for other unusual  diaphoresis, sweats, appetite or weight changes HENT: Negative for other worsening hearing loss, ear pain, facial swelling, mouth sores or neck stiffness.   Eyes: Negative for other worsening pain, redness or other visual disturbance.  Respiratory: Negative for other stridor or swelling Cardiovascular: Negative for other palpitations or other chest pain  Gastrointestinal: Negative for worsening diarrhea or loose stools, blood in  stool, distention or other pain Genitourinary: Negative for hematuria, flank pain or other change in urine volume.  Musculoskeletal: Negative for myalgias or other joint swelling.  Skin: Negative for other color change, or other wound or worsening drainage.  Neurological: Negative for other syncope or numbness. Hematological: Negative for other adenopathy or swelling Psychiatric/Behavioral: Negative for hallucinations, other worsening agitation, SI, self-injury, or new decreased concentration All other system neg per pt    Objective:   Physical Exam BP 122/76   Pulse (!) 110   Temp 98 F (36.7 C) (Oral)   Ht 5' (1.524 m)   Wt 91 lb (41.3 kg)   SpO2 98%   BMI 17.77 kg/m  VS noted,  Constitutional: Pt is oriented to person, place, and time. Appears well-developed and well-nourished, in no significant distress and comfortable Head: Normocephalic and atraumatic  Eyes: Conjunctivae and EOM are normal. Pupils are equal, round, and reactive to light Right Ear: External ear normal without discharge Left Ear: External ear normal without discharge Nose: Nose without discharge or deformity Mouth/Throat: Oropharynx is without other ulcerations and moist  Neck: Normal range of motion. Neck supple. No JVD present. No tracheal deviation present or significant neck LA or mass Cardiovascular: Normal rate, regular rhythm, normal heart sounds and intact distal pulses.   Pulmonary/Chest: WOB normal and breath sounds without rales or wheezing  Abdominal: Soft. Bowel sounds are normal. NT. No HSM  Musculoskeletal: Normal range of motion. Exhibits no edema Lymphadenopathy: Has no other cervical adenopathy.  Neurological: Pt is alert and oriented to person, place, and time. Pt has normal reflexes. No cranial nerve deficit. Motor grossly intact, Gait intact Skin: Skin is warm and dry. No rash noted or new ulcerations Psychiatric:  Has normal mood and affect. Behavior is normal without agitation No other  exam findings Lab Results  Component Value Date   WBC 9.4 03/26/2017   HGB 12.0 03/26/2017   HCT 37.6 03/26/2017   PLT 334.0 03/26/2017   GLUCOSE 98 03/26/2017   CHOL 182 03/26/2017   TRIG 120.0 03/26/2017   HDL 59.20 03/26/2017   LDLCALC 98 03/26/2017   ALT 11 03/26/2017   AST 17 03/26/2017   NA 140 03/26/2017   K 3.9 03/26/2017   CL 110 03/26/2017   CREATININE 1.14 03/26/2017   BUN 23 03/26/2017   CO2 21 03/26/2017   TSH 0.58 03/26/2017   HGBA1C 6.0 03/26/2017      Assessment & Plan:

## 2017-03-26 NOTE — Patient Instructions (Addendum)
Jonelle Sidle (the head office nurse) should contact you regarding the Prolia copay and start  Please continue all other medications as before, and refills have been done if requested.  Please have the pharmacy call with any other refills you may need.  Please continue your efforts at being more active, low cholesterol diet, and weight control.  You are otherwise up to date with prevention measures today.  Please keep your appointments with your specialists as you may have planned  Please go to the LAB in the Basement (turn left off the elevator) for the tests to be done today  .You will be contacted by phone if any changes need to be made immediately.  Otherwise, you will receive a letter about your results with an explanation, but please check with MyChart first.  Please remember to sign up for MyChart if you have not done so, as this will be important to you in the future with finding out test results, communicating by private email, and scheduling acute appointments online when needed.  Please return in 1 year for your yearly visit, or sooner if needed, with Lab testing done 3-5 days before

## 2017-03-26 NOTE — Telephone Encounter (Signed)
I will be verifying insurance and call patient back with coverage/benefits

## 2017-03-28 NOTE — Assessment & Plan Note (Signed)

## 2017-03-28 NOTE — Assessment & Plan Note (Signed)
Asympt, for a1c with labs 

## 2017-03-31 DIAGNOSIS — N390 Urinary tract infection, site not specified: Secondary | ICD-10-CM | POA: Diagnosis not present

## 2017-03-31 DIAGNOSIS — R351 Nocturia: Secondary | ICD-10-CM | POA: Diagnosis not present

## 2017-04-08 ENCOUNTER — Ambulatory Visit (HOSPITAL_BASED_OUTPATIENT_CLINIC_OR_DEPARTMENT_OTHER): Payer: Medicare Other

## 2017-04-08 ENCOUNTER — Ambulatory Visit (HOSPITAL_BASED_OUTPATIENT_CLINIC_OR_DEPARTMENT_OTHER): Payer: Medicare Other | Admitting: Adult Health

## 2017-04-08 ENCOUNTER — Encounter: Payer: Self-pay | Admitting: Adult Health

## 2017-04-08 ENCOUNTER — Telehealth: Payer: Self-pay

## 2017-04-08 VITALS — BP 131/52 | HR 108 | Temp 98.2°F | Resp 16 | Ht 60.0 in | Wt 91.0 lb

## 2017-04-08 DIAGNOSIS — D508 Other iron deficiency anemias: Secondary | ICD-10-CM

## 2017-04-08 DIAGNOSIS — C50412 Malignant neoplasm of upper-outer quadrant of left female breast: Secondary | ICD-10-CM

## 2017-04-08 DIAGNOSIS — Z170 Estrogen receptor positive status [ER+]: Secondary | ICD-10-CM

## 2017-04-08 DIAGNOSIS — D509 Iron deficiency anemia, unspecified: Secondary | ICD-10-CM

## 2017-04-08 DIAGNOSIS — Z853 Personal history of malignant neoplasm of breast: Secondary | ICD-10-CM

## 2017-04-08 LAB — IRON AND TIBC
%SAT: 41 % (ref 21–57)
IRON: 118 ug/dL (ref 41–142)
TIBC: 286 ug/dL (ref 236–444)
UIBC: 168 ug/dL (ref 120–384)

## 2017-04-08 LAB — FERRITIN: Ferritin: 62 ng/ml (ref 9–269)

## 2017-04-08 NOTE — Progress Notes (Signed)
CLINIC:  Survivorship   REASON FOR VISIT:  Routine follow-up for history of breast cancer.   BRIEF ONCOLOGIC HISTORY:  Per Dr. Virgie Dad last note from 12/11/2015:  1.  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  2.  Status post bilateral breast MRI on 11/18/2010 which noted a history of bilateral silicone breast implants removed in 2010 due to implant rupture.  The MRI also showed a left breast 1.2 cm mass at the 2:30 position, and a 1.1 cm intramammary lymph node on the right breast.    3.  Status post right breast needle core biopsy on 11/20/2010 which showed no evidence of malignancy.  4.  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 The patient started antiestrogen therapy with Tamoxifen in 11/2010, the plan being to continue for total of 5 years (until July 2017)  6.  Osteoporosis with T-2.9 score 03/04/2011 (at [place])   INTERVAL HISTORY:  Ms. Leuthold presents to the [facility] today for routine follow-up for her history of breast cancer.  Overall, she reports feeling quite well. She sees her PCP Dr. Jenny Reichmann regularly.  She has upcoming bone density and CT lung cancer screen ordered by him.  She walks daily.  She denies any issues, questions, or concerns today.    She discussed her iron deficiency anemia with Gretchen at her last appointment, and tried iron supplementation following the appointment, however was unable to tolerate due to nausea.  She has changed her diet.  She recently had labs drawn with Dr. Jenny Reichmann and her hemoglobin was up to 12.  She  says she is eating iron rich diet.     REVIEW OF SYSTEMS:  Review of Systems  Constitutional: Negative for appetite change, chills, fatigue and fever.  HENT:   Negative for hearing loss and lump/mass.   Eyes: Negative for eye problems and icterus.  Respiratory: Negative for chest tightness, cough and shortness of breath.   Cardiovascular: Negative for chest pain, leg swelling and palpitations.  Gastrointestinal: Negative for abdominal distention, abdominal pain, constipation, diarrhea, nausea and vomiting.  Endocrine: Negative for hot flashes.  Musculoskeletal: Negative for arthralgias.  Skin: Negative for itching and rash.  Neurological: Negative for dizziness, extremity weakness, headaches and numbness.  Hematological: Negative for adenopathy.  Psychiatric/Behavioral: Negative for depression. The patient is not nervous/anxious.          PAST MEDICAL/SURGICAL HISTORY:  Past Medical History:  Diagnosis Date  . ABNORMAL THYROID FUNCTION TESTS 12/10/2009  . ALCOHOLIC HEPATITIS, HX OF 02/14/2007  . ALLERGIC RHINITIS 02/14/2007  . Anemia, unspecified 04/26/2012  . ANXIETY 03/19/2007  . Breast cancer ([place])   . Breast implant removal status 12/23/2008  . COLONIC POLYPS, HX OF 02/14/2007  . Coronary artery calcification seen on CT scan 10/31/2013  . GOITER, MULTINODULAR 02/14/2007  . HX, PERSONAL, ALCOHOLISM 02/14/2007  . HYPERLIPIDEMIA 02/14/2007  . HYPERTENSION 02/14/2007  . Insomnia   . OSTEOARTHRITIS 02/14/2007  . OSTEOPOROSIS 02/14/2007  . PANCREATITIS, HX OF 02/14/2007  . Smoker 09/06/2013   Past Surgical History:  Procedure Laterality Date  . BREAST SURGERY  1275   silicone prepectoral implants  explanted by Dr. Towanda Malkin after rupture  . BREAST SURGERY     left simple mastectomy and left axillary sentinel node biopsy, right prophylactic mastectomy  . CATARACT EXTRACTION    . peridontal  2010  . s/p jaw and periodontal surgury  2009     ALLERGIES:  Allergies  Allergen  Reactions  . Tetracycline Anaphylaxis  . Lovastatin Other (See Comments)    Muscle cramps  . Pravastatin Other (See Comments)    Leg cramps     CURRENT MEDICATIONS:  Outpatient Encounter Medications as of 04/08/2017  Medication Sig  . ALPRAZolam (XANAX) 0.25 MG tablet TAKE ONE TABLET THREE TIMES DAILY AS NEEDED  . Cholecalciferol (VITAMIN D) 1000 UNITS capsule Take 1,000 Units by mouth daily.    Marland Kitchen HYDROcodone-acetaminophen (NORCO/VICODIN) 5-325 MG tablet Take 1 tablet by mouth 2 (two) times daily as needed.  Marland Kitchen losartan (COZAAR) 50 MG tablet Take 1 tablet (50 mg total) by mouth daily.  . potassium chloride (MICRO-K) 10 MEQ CR capsule Take 1 capsule (10 mEq total) by mouth daily.  Marland Kitchen trimethoprim (TRIMPEX) 100 MG tablet Take 100 mg by mouth daily.  . [DISCONTINUED] cyanocobalamin (CVS VITAMIN B12) 1000 MCG tablet Take 0.5 tablets (500 mcg total) by mouth daily. (Patient not taking: Reported on 04/08/2017)   Facility-Administered Encounter Medications as of 04/08/2017  Medication  . lidocaine (PF) (XYLOCAINE) 1 % injection 0.9 mL     ONCOLOGIC FAMILY HISTORY:  Family History  Adopted: Yes  Problem Relation Age of Onset  . Hyperlipidemia Brother     GENETIC COUNSELING/TESTING: Not at this time  SOCIAL HISTORY:  Ellanore Vanhook is divorced and lives alone in [place], [place].  She has 3 children (1 deceased from heart attack at age 64) and they live in [place] and [place].  Ms. Hornbaker is currently retired.  She denies any  alcohol, or illicit drug use.  She is a current every day smoker of 1/2 ppd for about 60 years.     PHYSICAL EXAMINATION:  Vital Signs: Vitals:   04/08/17 0952  BP: (!) 131/52  Pulse: (!) 108  Resp: 16  Temp: 98.2 F (36.8 C)  SpO2: 99%   Filed Weights   04/08/17 0952  Weight: 91 lb (41.3 kg)   General: Well-nourished, well-appearing female in no acute distress.  Unaccompanied today.   HEENT: Head is normocephalic.  Pupils equal and  reactive to light. Conjunctivae clear without exudate.  Sclerae anicteric. Oral mucosa is pink, moist.  Oropharynx is pink without lesions or erythema.  Lymph: No cervical, supraclavicular, or infraclavicular lymphadenopathy noted on palpation.  Cardiovascular: Regular rate and rhythm.Marland Kitchen Respiratory: Clear to auscultation bilaterally. Chest expansion symmetric; breathing non-labored.  Breast Exam:  -s/p bilateral  Mastectomies no nodularity, swelling, thickness, skin change or sign of recurrence -Axilla: No axillary adenopathy bilaterally.  GI: Abdomen soft and round; non-tender, non-distended. Bowel sounds normoactive. No hepatosplenomegaly.   GU: Deferred.  Neuro: No focal deficits. Steady gait.  Psych: Mood and affect normal and appropriate for situation.  MSK: No focal spinal tenderness to palpation, full range of motion in bilateral upper extremities Extremities: No edema. Skin: Warm and dry.  LABORATORY DATA:  None for this visit   DIAGNOSTIC IMAGING:  Most recent mammogram:  N/a s/p bilateral mastectomies    ASSESSMENT AND PLAN:  Ms.. Radman is a pleasant 76 y.o. female with history of Stage IA left breast invasive ductal carcinoma, ER+/PR+/HER2-, diagnosed in 10/2010, treated with bilateral mastectomies with reconstruction and anti estrogen  therapy with Tamoxifen x 5 years finishing in 11/2015..  She [facility] for surveillance and routine follow-up.   1. History of breast cancer:  Ms. Yera is currently clinically and radiographically without evidence of disease or recurrence of breast cancer. I counseled her that she no longer "needs" to f/u with [place] for evaluation, and that she could graduate from our care, or that she could see either me or Dr. Donne Hazel at this point, instead of both of [place].  She told me that she likes seeing both places and wants to continue to ensure she is cancer free.  I encouraged her to call me with any questions or concerns before her  next visit at the cancer center, and I would be happy to see her sooner, if needed.    2. Iron deficiency anemia.Her hemoglobin a couple of weeks ago was 12, which is improved.  She is not taking oral iron, but is working on a healthy diet.  We again reviewed that vitamin c will aid in iron absorption.  We will check iron studies today, but I suspect they will be improved, as she is clinically well, and her hgb is better.  3.  Tobacco use disorder: Patient aware of health risks related to a 60 year smoking history.  She is not yet ready to quit, and will let us know if/when she is.    4. Bone health:  Given Ms. Loh's age, history of breast cancer, h/o tobacco use, she is at risk for bone demineralization.  She has a bone density ordered by her PCP.  We will defer to Dr. Jenny Reichmann regarding bone density testing and management.  She was given education on specific food and activities to promote bone health.  5. Cancer screening:  Due to Ms. Fines's history and her age, she should receive screening for skin cancers, colon cancer, and lung cancer. She was encouraged to follow-up with her PCP for appropriate cancer screenings.   6. Health maintenance and wellness promotion: Ms. Gonsalves was encouraged to consume 5-7 servings of fruits and vegetables per day. She was also encouraged to engage in moderate to vigorous exercise for 30 minutes per day most days of the week. She was instructed to limit her alcohol consumption and was encouraged stop smoking.      Dispo:  -Return to cancer center in one year for LTS follow up    A total of (30) minutes of face-to-face time was spent with this patient with greater than 50% of that time in counseling and care-coordination.   Gardenia Phlegm, [place] [phone]   Note: PRIMARY CARE PROVIDER Biagio Borg, [place] [phone]

## 2017-04-08 NOTE — Telephone Encounter (Signed)
Called pt and lvm to notify her that her iron levels were normal and no additional supplementation is needed at this time. Call back number provided to have pt confirm that she received message and instruction regarding iron level results.

## 2017-04-09 ENCOUNTER — Encounter: Payer: Medicare Other | Admitting: Adult Health

## 2017-04-15 ENCOUNTER — Telehealth: Payer: Self-pay | Admitting: Adult Health

## 2017-04-15 NOTE — Telephone Encounter (Signed)
Scheduled appt per 11/14 los - sent reminder letter in the mail - f/u for LTS in one  Year.

## 2017-04-28 DIAGNOSIS — H52223 Regular astigmatism, bilateral: Secondary | ICD-10-CM | POA: Diagnosis not present

## 2017-06-09 ENCOUNTER — Telehealth: Payer: Self-pay

## 2017-06-09 NOTE — Telephone Encounter (Signed)
Left message asking patient to call [facility] office back, can talk with tamara, about starting prolia injections---I have verified insurance for 2019 and patient will owe estimated $220 copay---she may qualify for patient assistance funding if she would like to try that --this would be funding to cover copay amount---needs to talk with tamara, RN at [place] office to answer further questions and check on patient assistance funding

## 2017-10-01 ENCOUNTER — Other Ambulatory Visit: Payer: Self-pay | Admitting: Internal Medicine

## 2017-10-01 NOTE — Telephone Encounter (Signed)
Done erx 

## 2017-10-01 NOTE — Telephone Encounter (Signed)
Pt will not pull up in [employer] for look up. Please advise.

## 2017-10-23 NOTE — Telephone Encounter (Signed)
Left message regarding Prolia injection. See message below.

## 2018-03-29 ENCOUNTER — Telehealth: Payer: Self-pay | Admitting: *Deleted

## 2018-03-29 NOTE — Telephone Encounter (Signed)
Called Patient and scheduled an AWV for 03/30/18 after her PCP appointment.

## 2018-03-29 NOTE — Progress Notes (Addendum)
Subjective:   Tammy Wolfe is a 77 y.o. female who presents for Medicare Annual (Subsequent) preventive examination.  Review of Systems:  No ROS.  Medicare Wellness Visit. Additional risk factors are reflected in the social history.  Cardiac Risk Factors include: advanced age (>77men, >12 women);hypertension Sleep patterns: gets up 1-2 times nightly to void and sleeps 6-7 hours nightly.    Home Safety/Smoke Alarms: Feels safe in home. Smoke alarms in place.  Living environment; residence and Firearm Safety: 2-story house, no firearms. Lives alone, no needs for DME, good support system Seat Belt Safety/Bike Helmet: Wears seat belt.      Objective:     Vitals: BP 114/68   Pulse 98   Ht 5' (1.524 m)   Wt 82 lb (37.2 kg)   SpO2 100%   BMI 16.01 kg/m   Body mass index is 16.01 kg/m.  Advanced Directives 03/30/2018  Does Patient Have a Medical Advance Directive? Yes  Type of Paramedic of [place];Living will  Copy of [place] in Chart? No - copy requested    Tobacco Social History   Tobacco Use  Smoking Status Current Every Day Smoker  . Packs/day: 0.50  . Types: Cigarettes  Smokeless Tobacco Never Used  Tobacco Comment   smoking about 4 cigs/day (as of 04/10/16)-gwd     Ready to quit: No Counseling given: No Comment: smoking about 4 cigs/day (as of 04/10/16)-gwd  Past Medical History:  Diagnosis Date  . ABNORMAL THYROID FUNCTION TESTS 12/10/2009  . ALCOHOLIC HEPATITIS, HX OF 02/14/2007  . ALLERGIC RHINITIS 02/14/2007  . Anemia, unspecified 04/26/2012  . ANXIETY 03/19/2007  . Breast cancer ([place])   . Breast implant removal status 12/23/2008  . COLONIC POLYPS, HX OF 02/14/2007  . Coronary artery calcification seen on CT scan 10/31/2013  . GOITER, MULTINODULAR 02/14/2007  . HX, PERSONAL, ALCOHOLISM 02/14/2007  . HYPERLIPIDEMIA 02/14/2007  . HYPERTENSION 02/14/2007  . Insomnia   . OSTEOARTHRITIS 02/14/2007  . OSTEOPOROSIS  02/14/2007  . PANCREATITIS, HX OF 02/14/2007  . Smoker 09/06/2013   Past Surgical History:  Procedure Laterality Date  . BREAST SURGERY  8938   silicone prepectoral implants explanted by Dr. Towanda Malkin after rupture  . BREAST SURGERY     left simple mastectomy and left axillary sentinel node biopsy, right prophylactic mastectomy  . CATARACT EXTRACTION    . peridontal  2010  . s/p jaw and periodontal surgury  2009   Family History  Adopted: Yes  Problem Relation Age of Onset  . Hyperlipidemia Brother    Social History   Socioeconomic History  . Marital status: Divorced    Spouse name: Not on file  . Number of children: 3  . Years of education: Not on file  . Highest education level: Not on file  Occupational History  . Occupation: retired Psychologist, sport and exercise  Social Needs  . Financial resource strain: Not hard at all  . Food insecurity:    Worry: Never true    Inability: Never true  . Transportation needs:    Medical: No    Non-medical: No  Tobacco Use  . Smoking status: Current Every Day Smoker    Packs/day: 0.50    Types: Cigarettes  . Smokeless tobacco: Never Used  . Tobacco comment: smoking about 4 cigs/day (as of 04/10/16)-gwd  Substance and Sexual Activity  . Alcohol use: Yes    Alcohol/week: 0.0 standard drinks    Comment: one glass/Ericia Moxley occasional/ no drinking now  . Drug  use: No  . Sexual activity: Not Currently  Lifestyle  . Physical activity:    Days per week: 4 days    Minutes per session: 40 min  . Stress: Not at all  Relationships  . Social connections:    Talks on phone: More than three times a week    Gets together: More than three times a week    Attends religious service: 1 to 4 times per year    Active member of club or organization: Yes    Attends meetings of clubs or organizations: More than 4 times per year    Relationship status: Not on file  Other Topics Concern  . Not on file  Social History Narrative  . Not on file    Outpatient Encounter  Medications as of 03/30/2018  Medication Sig  . Cholecalciferol (VITAMIN D) 1000 UNITS capsule Take 1,000 Units by mouth daily.    Marland Kitchen losartan (COZAAR) 50 MG tablet Take 1 tablet (50 mg total) by mouth daily.  . potassium chloride (MICRO-K) 10 MEQ CR capsule Take 1 capsule (10 mEq total) by mouth daily.  . [DISCONTINUED] ALPRAZolam (XANAX) 0.25 MG tablet TAKE ONE TABLET THREE TIMES DAILY AS NEEDED  . [DISCONTINUED] HYDROcodone-acetaminophen (NORCO/VICODIN) 5-325 MG tablet Take 1 tablet by mouth 2 (two) times daily as needed.  . [DISCONTINUED] losartan (COZAAR) 50 MG tablet Take 1 tablet (50 mg total) by mouth daily.  . [DISCONTINUED] potassium chloride (MICRO-K) 10 MEQ CR capsule Take 1 capsule (10 mEq total) by mouth daily.  . [DISCONTINUED] trimethoprim (TRIMPEX) 100 MG tablet Take 100 mg by mouth daily.   Facility-Administered Encounter Medications as of 03/30/2018  Medication  . lidocaine (PF) (XYLOCAINE) 1 % injection 0.9 mL    Activities of Daily Living In your present state of health, do you have any difficulty performing the following activities: 03/30/2018  Hearing? N  Vision? N  Difficulty concentrating or making decisions? N  Walking or climbing stairs? N  Dressing or bathing? N  Doing errands, shopping? N  Preparing Food and eating ? N  Using the Toilet? N  In the past six months, have you accidently leaked urine? N  Do you have problems with loss of bowel control? N  Managing your Medications? N  Managing your Finances? N  Housekeeping or managing your Housekeeping? N  Some recent data might be hidden    Patient Care Team: Biagio Borg, MD as PCP - General Bjorn Loser, MD as Consulting Physician (Urology)    Assessment:   This is a routine wellness examination for [name]. Physical assessment deferred to PCP.   Exercise Activities and Dietary recommendations Current Exercise Habits: Home exercise routine, Type of exercise: walking(maintains yard year round),  Time (Minutes): 45, Frequency (Times/Week): 4, Weekly Exercise (Minutes/Week): 180, Intensity: Mild, Exercise limited by: None identified  Diet (meal preparation, eat out, water intake, caffeinated beverages, dairy products, fruits and vegetables): in general, a "healthy" diet  , well balanced   Reviewed heart healthy diet. Encouraged patient to increase daily water and healthy fluid intake.  Goals      Patient Stated   . patient (pt-stated)     Wants to be healthy as she can be      Other   . Patient Stated     Continue to be as healthy and as independent as possible. Love family and enjoy life.       Fall Risk Fall Risk  03/30/2018 03/30/2018 03/26/2017 03/14/2015 03/14/2015  Falls in  the past year? 0 0 No No No  Follow up Falls prevention discussed - - - -   Depression Screen PHQ 2/9 Scores 03/30/2018 03/30/2018 03/26/2017 03/12/2016  PHQ - 2 Score 0 0 0 0     Cognitive Function MMSE - Mini Mental State Exam 03/30/2018  Orientation to time 5  Orientation to Place 5  Registration 3  Attention/ Calculation 5  Recall 2  Language- name 2 objects 2  Language- repeat 1  Language- follow 3 step command 3  Language- read & follow direction 1  Write a sentence 1  Copy design 1  Total score 29        Immunization History  Administered Date(s) Administered  . Influenza Whole 03/19/2007  . Influenza, High Dose Seasonal PF 03/08/2013, 03/07/2014, 03/14/2015, 03/26/2017, 03/30/2018  . Influenza,inj,Quad PF,6+ Mos 03/12/2016  . Pneumococcal Conjugate-13 03/29/2013  . Pneumococcal Polysaccharide-23 01/21/2006  . Td 11/12/2009  . Zoster 01/21/2006   Screening Tests Health Maintenance  Topic Date Due  . INFLUENZA VACCINE  12/24/2017  . TETANUS/TDAP  11/13/2019  . DEXA SCAN  Completed  . PNA vac Low Risk Adult  Completed      Plan:     Continue doing brain stimulating activities (puzzles, reading, adult coloring books, staying active) to keep memory sharp.   Continue  to eat heart healthy diet (full of fruits, vegetables, whole grains, lean protein, water--limit salt, fat, and sugar intake) and increase physical activity as tolerated.   I have personally reviewed and noted the following in the patient's chart:   . Medical and social history . Use of alcohol, tobacco or illicit drugs  . Current medications and supplements . Functional ability and status . Nutritional status . Physical activity . Advanced directives . List of other physicians . Vitals . Screenings to include cognitive, depression, and falls . Referrals and appointments  In addition, I have reviewed and discussed with patient certain preventive protocols, quality metrics, and best practice recommendations. A written personalized care plan for preventive services as well as general preventive health recommendations were provided to patient.     Michiel Cowboy, RN  03/30/2018    Medical screening examination/treatment/procedure(s) were performed by non-physician practitioner and as supervising physician I was immediately available for consultation/collaboration. I agree with above. Cathlean Cower, MD

## 2018-03-30 ENCOUNTER — Encounter: Payer: Self-pay | Admitting: Internal Medicine

## 2018-03-30 ENCOUNTER — Ambulatory Visit (INDEPENDENT_AMBULATORY_CARE_PROVIDER_SITE_OTHER): Payer: Medicare Other | Admitting: Internal Medicine

## 2018-03-30 ENCOUNTER — Ambulatory Visit (INDEPENDENT_AMBULATORY_CARE_PROVIDER_SITE_OTHER): Payer: Medicare Other | Admitting: *Deleted

## 2018-03-30 ENCOUNTER — Other Ambulatory Visit (INDEPENDENT_AMBULATORY_CARE_PROVIDER_SITE_OTHER): Payer: Medicare Other

## 2018-03-30 VITALS — BP 114/68 | HR 98 | Temp 98.1°F | Ht 60.0 in | Wt 82.0 lb

## 2018-03-30 VITALS — BP 114/68 | HR 98 | Ht 60.0 in | Wt 82.0 lb

## 2018-03-30 DIAGNOSIS — R7302 Impaired glucose tolerance (oral): Secondary | ICD-10-CM

## 2018-03-30 DIAGNOSIS — E876 Hypokalemia: Secondary | ICD-10-CM | POA: Diagnosis not present

## 2018-03-30 DIAGNOSIS — Z0000 Encounter for general adult medical examination without abnormal findings: Secondary | ICD-10-CM | POA: Diagnosis not present

## 2018-03-30 DIAGNOSIS — E785 Hyperlipidemia, unspecified: Secondary | ICD-10-CM | POA: Diagnosis not present

## 2018-03-30 DIAGNOSIS — I10 Essential (primary) hypertension: Secondary | ICD-10-CM

## 2018-03-30 DIAGNOSIS — Z23 Encounter for immunization: Secondary | ICD-10-CM

## 2018-03-30 LAB — HEPATIC FUNCTION PANEL
ALBUMIN: 4 g/dL (ref 3.5–5.2)
ALK PHOS: 66 U/L (ref 39–117)
ALT: 11 U/L (ref 0–35)
AST: 24 U/L (ref 0–37)
BILIRUBIN DIRECT: 0.1 mg/dL (ref 0.0–0.3)
Total Bilirubin: 0.4 mg/dL (ref 0.2–1.2)
Total Protein: 6.8 g/dL (ref 6.0–8.3)

## 2018-03-30 LAB — LIPID PANEL
Cholesterol: 149 mg/dL (ref 0–200)
HDL: 70 mg/dL (ref >39.00)
LDL Cholesterol: 65 mg/dL (ref 0–99)
NONHDL: 79.03
TRIGLYCERIDES: 70 mg/dL (ref 0.0–149.0)
Total CHOL/HDL Ratio: 2
VLDL: 14 mg/dL (ref 0.0–40.0)

## 2018-03-30 LAB — URINALYSIS, ROUTINE W REFLEX MICROSCOPIC
BILIRUBIN URINE: NEGATIVE
HGB URINE DIPSTICK: NEGATIVE
KETONES UR: NEGATIVE
NITRITE: NEGATIVE
RBC / HPF: NONE SEEN (ref 0–?)
Specific Gravity, Urine: <=1.005 — AB (ref 1.000–1.030)
TOTAL PROTEIN, URINE-UPE24: NEGATIVE
URINE GLUCOSE: NEGATIVE
Urobilinogen, UA: 0.2 (ref 0.0–1.0)
pH: 6.5 (ref 5.0–8.0)

## 2018-03-30 LAB — CBC WITH DIFFERENTIAL/PLATELET
BASOS ABS: 0 10*3/uL (ref 0.0–0.1)
Basophils Relative: 0.5 % (ref 0.0–3.0)
EOS ABS: 0.1 10*3/uL (ref 0.0–0.7)
Eosinophils Relative: 0.7 % (ref 0.0–5.0)
HCT: 33.5 % — ABNORMAL LOW (ref 36.0–46.0)
Hemoglobin: 11.1 g/dL — ABNORMAL LOW (ref 12.0–15.0)
LYMPHS ABS: 2 10*3/uL (ref 0.7–4.0)
Lymphocytes Relative: 26.1 % (ref 12.0–46.0)
MCHC: 33.2 g/dL (ref 30.0–36.0)
MCV: 86.2 fl (ref 78.0–100.0)
Monocytes Absolute: 0.5 10*3/uL (ref 0.1–1.0)
Monocytes Relative: 6.6 % (ref 3.0–12.0)
NEUTROS ABS: 5.2 10*3/uL (ref 1.4–7.7)
NEUTROS PCT: 66.1 % (ref 43.0–77.0)
PLATELETS: 311 10*3/uL (ref 150.0–400.0)
RBC: 3.89 Mil/uL (ref 3.87–5.11)
RDW: 15.4 % (ref 11.5–15.5)
WBC: 7.8 10*3/uL (ref 4.0–10.5)

## 2018-03-30 LAB — HEMOGLOBIN A1C: HEMOGLOBIN A1C: 5.4 % (ref 4.6–6.5)

## 2018-03-30 LAB — BASIC METABOLIC PANEL
BUN: 20 mg/dL (ref 6–23)
CHLORIDE: 104 meq/L (ref 96–112)
CO2: 21 meq/L (ref 19–32)
CREATININE: 1.07 mg/dL (ref 0.40–1.20)
Calcium: 9.3 mg/dL (ref 8.4–10.5)
GFR: 52.76 mL/min — ABNORMAL LOW (ref >60.00)
Glucose, Bld: 101 mg/dL — ABNORMAL HIGH (ref 70–99)
Potassium: 3.7 mEq/L (ref 3.5–5.1)
Sodium: 133 mEq/L — ABNORMAL LOW (ref 135–145)

## 2018-03-30 LAB — TSH: TSH: 0.45 u[IU]/mL (ref 0.35–4.50)

## 2018-03-30 MED ORDER — LOSARTAN POTASSIUM 50 MG PO TABS: 50.0000 mg | ORAL_TABLET | Freq: Every day | ORAL | 3 refills | Status: DC

## 2018-03-30 MED ORDER — POTASSIUM CHLORIDE ER 10 MEQ PO CPCR: 10.0000 meq | ORAL_CAPSULE | Freq: Every day | ORAL | 3 refills | Status: DC

## 2018-03-30 MED ORDER — HYDROCODONE-ACETAMINOPHEN 5-325 MG PO TABS: 1.0000 | ORAL_TABLET | Freq: Two times a day (BID) | ORAL | 0 refills | Status: DC | PRN

## 2018-03-30 MED ORDER — ZOSTER VAC RECOMB ADJUVANTED 50 MCG/0.5ML IM SUSR: 0.5000 mL | Freq: Once | INTRAMUSCULAR | 1 refills | Status: AC

## 2018-03-30 MED ORDER — TRIMETHOPRIM 100 MG PO TABS: 100.0000 mg | ORAL_TABLET | Freq: Every day | ORAL | 3 refills | Status: DC

## 2018-03-30 MED ORDER — ALPRAZOLAM 0.25 MG PO TABS: ORAL_TABLET | ORAL | 5 refills | Status: DC

## 2018-03-30 NOTE — Patient Instructions (Addendum)
Continue doing brain stimulating activities (puzzles, reading, adult coloring books, staying active) to keep memory sharp.   Continue to eat heart healthy diet (full of fruits, vegetables, whole grains, lean protein, water--limit salt, fat, and sugar intake) and increase physical activity as tolerated.   Ms. Tammy Wolfe , Thank you for taking time to come for your Medicare Wellness Visit. I appreciate your ongoing commitment to your health goals. Please review the following plan we discussed and let me know if I can assist you in the future.   These are the goals we discussed: Goals      Patient Stated   . patient (pt-stated)     Wants to be healthy as she can be      Other   . Patient Stated     Continue to be as healthy and as independent as possible. Love family and enjoy life.       This is a list of the screening recommended for you and due dates:  Health Maintenance  Topic Date Due  . Flu Shot  12/24/2017  . Tetanus Vaccine  11/13/2019  . DEXA scan (bone density measurement)  Completed  . Pneumonia vaccines  Completed   Health Maintenance, Female Adopting a healthy lifestyle and getting preventive care can go a long way to promote health and wellness. Talk with your health care provider about what schedule of regular examinations is right for you. This is a good chance for you to check in with your provider about disease prevention and staying healthy. In between checkups, there are plenty of things you can do on your own. Experts have done a lot of research about which lifestyle changes and preventive measures are most likely to keep you healthy. Ask your health care provider for more information. Weight and diet Eat a healthy diet  Be sure to include plenty of vegetables, fruits, low-fat dairy products, and lean protein.  Do not eat a lot of foods high in solid fats, added sugars, or salt.  Get regular exercise. This is one of the most important things you can do for your  health. ? Most adults should exercise for at least 150 minutes each week. The exercise should increase your heart rate and make you sweat (moderate-intensity exercise). ? Most adults should also do strengthening exercises at least twice a week. This is in addition to the moderate-intensity exercise.  Maintain a healthy weight  Body mass index (BMI) is a measurement that can be used to identify possible weight problems. It estimates body fat based on height and weight. Your health care provider can help determine your BMI and help you achieve or maintain a healthy weight.  For females 47 years of age and older: ? A BMI below 18.5 is considered underweight. ? A BMI of 18.5 to 24.9 is normal. ? A BMI of 25 to 29.9 is considered overweight. ? A BMI of 30 and above is considered obese.  Watch levels of cholesterol and blood lipids  You should start having your blood tested for lipids and cholesterol at 77 years of age, then have this test every 5 years.  You may need to have your cholesterol levels checked more often if: ? Your lipid or cholesterol levels are high. ? You are older than 77 years of age. ? You are at high risk for heart disease.  Cancer screening Lung Cancer  Lung cancer screening is recommended for adults 13-7 years old who are at high risk for lung cancer because  of a history of smoking.  A yearly low-dose CT scan of the lungs is recommended for people who: ? Currently smoke. ? Have quit within the past 15 years. ? Have at least a 30-pack-year history of smoking. A pack year is smoking an average of one pack of cigarettes a day for 1 year.  Yearly screening should continue until it has been 15 years since you quit.  Yearly screening should stop if you develop a health problem that would prevent you from having lung cancer treatment.  Breast Cancer  Practice breast self-awareness. This means understanding how your breasts normally appear and feel.  It also means  doing regular breast self-exams. Let your health care provider know about any changes, no matter how small.  If you are in your 20s or 30s, you should have a clinical breast exam (CBE) by a health care provider every 1-3 years as part of a regular health exam.  If you are 29 or older, have a CBE every year. Also consider having a breast X-ray (mammogram) every year.  If you have a family history of breast cancer, talk to your health care provider about genetic screening.  If you are at high risk for breast cancer, talk to your health care provider about having an MRI and a mammogram every year.  Breast cancer gene (BRCA) assessment is recommended for women who have family members with BRCA-related cancers. BRCA-related cancers include: ? Breast. ? Ovarian. ? Tubal. ? Peritoneal cancers.  Results of the assessment will determine the need for genetic counseling and BRCA1 and BRCA2 testing.  Cervical Cancer Your health care provider may recommend that you be screened regularly for cancer of the pelvic organs (ovaries, uterus, and vagina). This screening involves a pelvic examination, including checking for microscopic changes to the surface of your cervix (Pap test). You may be encouraged to have this screening done every 3 years, beginning at age 46.  For women ages 86-65, health care providers may recommend pelvic exams and Pap testing every 3 years, or they may recommend the Pap and pelvic exam, combined with testing for human papilloma virus (HPV), every 5 years. Some types of HPV increase your risk of cervical cancer. Testing for HPV may also be done on women of any age with unclear Pap test results.  Other health care providers may not recommend any screening for nonpregnant women who are considered low risk for pelvic cancer and who do not have symptoms. Ask your health care provider if a screening pelvic exam is right for you.  If you have had past treatment for cervical cancer or a  condition that could lead to cancer, you need Pap tests and screening for cancer for at least 20 years after your treatment. If Pap tests have been discontinued, your risk factors (such as having a new sexual partner) need to be reassessed to determine if screening should resume. Some women have medical problems that increase the chance of getting cervical cancer. In these cases, your health care provider may recommend more frequent screening and Pap tests.  Colorectal Cancer  This type of cancer can be detected and often prevented.  Routine colorectal cancer screening usually begins at 77 years of age and continues through 77 years of age.  Your health care provider may recommend screening at an earlier age if you have risk factors for colon cancer.  Your health care provider may also recommend using home test kits to check for hidden blood in the stool.  A small camera at the end of a tube can be used to examine your colon directly (sigmoidoscopy or colonoscopy). This is done to check for the earliest forms of colorectal cancer.  Routine screening usually begins at age 52.  Direct examination of the colon should be repeated every 5-10 years through 77 years of age. However, you may need to be screened more often if early forms of precancerous polyps or small growths are found.  Skin Cancer  Check your skin from head to toe regularly.  Tell your health care provider about any new moles or changes in moles, especially if there is a change in a mole's shape or color.  Also tell your health care provider if you have a mole that is larger than the size of a pencil eraser.  Always use sunscreen. Apply sunscreen liberally and repeatedly throughout the day.  Protect yourself by wearing long sleeves, pants, a wide-brimmed hat, and sunglasses whenever you are outside.  Heart disease, diabetes, and high blood pressure  High blood pressure causes heart disease and increases the risk of stroke.  High blood pressure is more likely to develop in: ? People who have blood pressure in the high end of the normal range (130-139/85-89 mm Hg). ? People who are overweight or obese. ? People who are African American.  If you are 35-68 years of age, have your blood pressure checked every 3-5 years. If you are 42 years of age or older, have your blood pressure checked every year. You should have your blood pressure measured twice-once when you are at a hospital or clinic, and once when you are not at a hospital or clinic. Record the average of the two measurements. To check your blood pressure when you are not at a hospital or clinic, you can use: ? An automated blood pressure machine at a pharmacy. ? A home blood pressure monitor.  If you are between 78 years and 77 years old, ask your health care provider if you should take aspirin to prevent strokes.  Have regular diabetes screenings. This involves taking a blood sample to check your fasting blood sugar level. ? If you are at a normal weight and have a low risk for diabetes, have this test once every three years after 77 years of age. ? If you are overweight and have a high risk for diabetes, consider being tested at a younger age or more often. Preventing infection Hepatitis B  If you have a higher risk for hepatitis B, you should be screened for this virus. You are considered at high risk for hepatitis B if: ? You were born in a country where hepatitis B is common. Ask your health care provider which countries are considered high risk. ? Your parents were born in a high-risk country, and you have not been immunized against hepatitis B (hepatitis B vaccine). ? You have HIV or AIDS. ? You use needles to inject street drugs. ? You live with someone who has hepatitis B. ? You have had sex with someone who has hepatitis B. ? You get hemodialysis treatment. ? You take certain medicines for conditions, including cancer, organ transplantation, and  autoimmune conditions.  Hepatitis C  Blood testing is recommended for: ? Everyone born from [birth year] through 1965. ? Anyone with known risk factors for hepatitis C.  Sexually transmitted infections (STIs)  You should be screened for sexually transmitted infections (STIs) including gonorrhea and chlamydia if: ? You are sexually active and are younger than 77  years of age. ? You are older than 77 years of age and your health care provider tells you that you are at risk for this type of infection. ? Your sexual activity has changed since you were last screened and you are at an increased risk for chlamydia or gonorrhea. Ask your health care provider if you are at risk.  If you do not have HIV, but are at risk, it may be recommended that you take a prescription medicine daily to prevent HIV infection. This is called pre-exposure prophylaxis (PrEP). You are considered at risk if: ? You are sexually active and do not regularly use condoms or know the HIV status of your partner(s). ? You take drugs by injection. ? You are sexually active with a partner who has HIV.  Talk with your health care provider about whether you are at high risk of being infected with HIV. If you choose to begin PrEP, you should first be tested for HIV. You should then be tested every 3 months for as long as you are taking PrEP. Pregnancy  If you are premenopausal and you may become pregnant, ask your health care provider about preconception counseling.  If you may become pregnant, take 400 to 800 micrograms (mcg) of folic acid every day.  If you want to prevent pregnancy, talk to your health care provider about birth control (contraception). Osteoporosis and menopause  Osteoporosis is a disease in which the bones lose minerals and strength with aging. This can result in serious bone fractures. Your risk for osteoporosis can be identified using a bone density scan.  If you are 28 years of age or older, or if you are at  risk for osteoporosis and fractures, ask your health care provider if you should be screened.  Ask your health care provider whether you should take a calcium or vitamin D supplement to lower your risk for osteoporosis.  Menopause may have certain physical symptoms and risks.  Hormone replacement therapy may reduce some of these symptoms and risks. Talk to your health care provider about whether hormone replacement therapy is right for you. Follow these instructions at home:  Schedule regular health, dental, and eye exams.  Stay current with your immunizations.  Do not use any tobacco products including cigarettes, chewing tobacco, or electronic cigarettes.  If you are pregnant, do not drink alcohol.  If you are breastfeeding, limit how much and how often you drink alcohol.  Limit alcohol intake to no more than 1 drink per day for nonpregnant women. One drink equals 12 ounces of beer, 5 ounces of wine, or 1 ounces of hard liquor.  Do not use street drugs.  Do not share needles.  Ask your health care provider for help if you need support or information about quitting drugs.  Tell your health care provider if you often feel depressed.  Tell your health care provider if you have ever been abused or do not feel safe at home. This information is not intended to replace advice given to you by your health care provider. Make sure you discuss any questions you have with your health care provider. Document Released: 11/25/2010 Document Revised: 10/18/2015 Document Reviewed: 02/13/2015 Elsevier Interactive Patient Education  [employer].

## 2018-03-30 NOTE — Patient Instructions (Addendum)
You had the flu shot today  Your shingles shot was sent to the pharmacy  Please continue all other medications as before, and refills have been done if requested.  Please have the pharmacy call with any other refills you may need.  Please continue your efforts at being more active, low cholesterol diet, and weight control.  You are otherwise up to date with prevention measures today.  Please keep your appointments with your specialists as you may have planned  Please go to the LAB in the Basement (turn left off the elevator) for the tests to be done today  You will be contacted by phone if any changes need to be made immediately.  Otherwise, you will receive a letter about your results with an explanation, but please check with MyChart first.  Please remember to sign up for MyChart if you have not done so, as this will be important to you in the future with finding out test results, communicating by private email, and scheduling acute appointments online when needed.  Please return in 1 year for your yearly visit, or sooner if needed, with Lab testing done 3-5 days before  

## 2018-03-31 DIAGNOSIS — C50912 Malignant neoplasm of unspecified site of left female breast: Secondary | ICD-10-CM | POA: Diagnosis not present

## 2018-04-02 DIAGNOSIS — N390 Urinary tract infection, site not specified: Secondary | ICD-10-CM | POA: Diagnosis not present

## 2018-04-02 NOTE — Assessment & Plan Note (Signed)

## 2018-04-02 NOTE — Assessment & Plan Note (Signed)
stable overall by history and exam, recent data reviewed with pt, and pt to continue medical treatment as before,  to f/u any worsening symptoms or concerns  

## 2018-04-02 NOTE — Progress Notes (Signed)
Subjective:    Patient ID: Tammy Wolfe, female    DOB: 12-31-1940, 77 y.o.   MRN: [id number]  HPI  Here for wellness and f/u;  Overall doing ok;  Pt denies Chest pain, worsening SOB, DOE, wheezing, orthopnea, PND, worsening LE edema, palpitations, dizziness or syncope.  Pt denies neurological change such as new headache, facial or extremity weakness.  Pt denies polydipsia, polyuria, or low sugar symptoms. Pt states overall good compliance with treatment and medications, good tolerability, and has been trying to follow appropriate diet.  Pt denies worsening depressive symptoms, suicidal ideation or panic. No fever, night sweats, wt loss, loss of appetite, or other constitutional symptoms.  Pt states good ability with ADL's, has low fall risk, home safety reviewed and adequate, no other significant changes in hearing or vision, and only occasionally active with exercise.  No other acute complaints Past Medical History:  Diagnosis Date  . ABNORMAL THYROID FUNCTION TESTS 12/10/2009  . ALCOHOLIC HEPATITIS, HX OF 02/14/2007  . ALLERGIC RHINITIS 02/14/2007  . Anemia, unspecified 04/26/2012  . ANXIETY 03/19/2007  . Breast cancer ([place])   . Breast implant removal status 12/23/2008  . COLONIC POLYPS, HX OF 02/14/2007  . Coronary artery calcification seen on CT scan 10/31/2013  . GOITER, MULTINODULAR 02/14/2007  . HX, PERSONAL, ALCOHOLISM 02/14/2007  . HYPERLIPIDEMIA 02/14/2007  . HYPERTENSION 02/14/2007  . Insomnia   . OSTEOARTHRITIS 02/14/2007  . OSTEOPOROSIS 02/14/2007  . PANCREATITIS, HX OF 02/14/2007  . Smoker 09/06/2013   Past Surgical History:  Procedure Laterality Date  . BREAST SURGERY  4239   silicone prepectoral implants explanted by Dr. Towanda Malkin after rupture  . BREAST SURGERY     left simple mastectomy and left axillary sentinel node biopsy, right prophylactic mastectomy  . CATARACT EXTRACTION    . peridontal  2010  . s/p jaw and periodontal surgury  2009    reports that she has been smoking  cigarettes. She has been smoking about 0.50 packs per day. She has never used smokeless tobacco. She reports that she drinks alcohol. She reports that she does not use drugs. family history includes Hyperlipidemia in her brother. She was adopted. Allergies  Allergen Reactions  . Tetracycline Anaphylaxis  . Lovastatin Other (See Comments)    Muscle cramps  . Pravastatin Other (See Comments)    Leg cramps   Current Outpatient Medications on File Prior to Visit  Medication Sig Dispense Refill  . Cholecalciferol (VITAMIN D) 1000 UNITS capsule Take 1,000 Units by mouth daily.       Current Facility-Administered Medications on File Prior to Visit  Medication Dose Route Frequency Provider Last Rate Last Dose  . lidocaine (PF) (XYLOCAINE) 1 % injection 0.9 mL  0.9 mL Other Once Plotnikov, Evie Lacks, MD       Review of Systems Constitutional: Negative for other unusual diaphoresis, sweats, appetite or weight changes HENT: Negative for other worsening hearing loss, ear pain, facial swelling, mouth sores or neck stiffness.   Eyes: Negative for other worsening pain, redness or other visual disturbance.  Respiratory: Negative for other stridor or swelling Cardiovascular: Negative for other palpitations or other chest pain  Gastrointestinal: Negative for worsening diarrhea or loose stools, blood in stool, distention or other pain Genitourinary: Negative for hematuria, flank pain or other change in urine volume.  Musculoskeletal: Negative for myalgias or other joint swelling.  Skin: Negative for other color change, or other wound or worsening drainage.  Neurological: Negative for other syncope or numbness. Hematological: Negative for  other adenopathy or swelling Psychiatric/Behavioral: Negative for hallucinations, other worsening agitation, SI, self-injury, or new decreased concentration All other system neg per pt    Objective:   Physical Exam BP 114/68   Pulse 98   Temp 98.1 F (36.7 C)  (Oral)   Ht 5' (1.524 m)   Wt 82 lb (37.2 kg)   SpO2 100%   BMI 16.01 kg/m  VS noted,  Constitutional: Pt is oriented to person, place, and time. Appears well-developed and well-nourished, in no significant distress and comfortable Head: Normocephalic and atraumatic  Eyes: Conjunctivae and EOM are normal. Pupils are equal, round, and reactive to light Right Ear: External ear normal without discharge Left Ear: External ear normal without discharge Nose: Nose without discharge or deformity Mouth/Throat: Oropharynx is without other ulcerations and moist  Neck: Normal range of motion. Neck supple. No JVD present. No tracheal deviation present or significant neck LA or mass Cardiovascular: Normal rate, regular rhythm, normal heart sounds and intact distal pulses.   Pulmonary/Chest: WOB normal and breath sounds without rales or wheezing  Abdominal: Soft. Bowel sounds are normal. NT. No HSM  Musculoskeletal: Normal range of motion. Exhibits no edema Lymphadenopathy: Has no other cervical adenopathy.  Neurological: Pt is alert and oriented to person, place, and time. Pt has normal reflexes. No cranial nerve deficit. Motor grossly intact, Gait intact Skin: Skin is warm and dry. No rash noted or new ulcerations Psychiatric:  Has nervous mood and affect. Behavior is normal without agitation No other exam findings Lab Results  Component Value Date   WBC 7.8 03/30/2018   HGB 11.1 (L) 03/30/2018   HCT 33.5 (L) 03/30/2018   PLT 311.0 03/30/2018   GLUCOSE 101 (H) 03/30/2018   CHOL 149 03/30/2018   TRIG 70.0 03/30/2018   HDL 70.00 03/30/2018   LDLCALC 65 03/30/2018   ALT 11 03/30/2018   AST 24 03/30/2018   NA 133 (L) 03/30/2018   K 3.7 03/30/2018   CL 104 03/30/2018   CREATININE 1.07 03/30/2018   BUN 20 03/30/2018   CO2 21 03/30/2018   TSH 0.45 03/30/2018   HGBA1C 5.4 03/30/2018          Assessment & Plan:

## 2018-04-02 NOTE — Assessment & Plan Note (Signed)
For f/u lab 

## 2018-04-08 ENCOUNTER — Encounter: Payer: Self-pay | Admitting: Adult Health

## 2018-04-08 ENCOUNTER — Telehealth: Payer: Self-pay | Admitting: Adult Health

## 2018-04-08 ENCOUNTER — Inpatient Hospital Stay: Payer: Medicare Other | Attending: Adult Health | Admitting: Adult Health

## 2018-04-08 VITALS — BP 135/79 | HR 97 | Temp 98.0°F | Resp 18 | Ht 60.0 in | Wt 85.4 lb

## 2018-04-08 DIAGNOSIS — I10 Essential (primary) hypertension: Secondary | ICD-10-CM | POA: Insufficient documentation

## 2018-04-08 DIAGNOSIS — I2510 Atherosclerotic heart disease of native coronary artery without angina pectoris: Secondary | ICD-10-CM | POA: Diagnosis not present

## 2018-04-08 DIAGNOSIS — Z9013 Acquired absence of bilateral breasts and nipples: Secondary | ICD-10-CM | POA: Diagnosis not present

## 2018-04-08 DIAGNOSIS — M1990 Unspecified osteoarthritis, unspecified site: Secondary | ICD-10-CM | POA: Diagnosis not present

## 2018-04-08 DIAGNOSIS — R229 Localized swelling, mass and lump, unspecified: Secondary | ICD-10-CM | POA: Insufficient documentation

## 2018-04-08 DIAGNOSIS — M810 Age-related osteoporosis without current pathological fracture: Secondary | ICD-10-CM

## 2018-04-08 DIAGNOSIS — F17210 Nicotine dependence, cigarettes, uncomplicated: Secondary | ICD-10-CM | POA: Diagnosis not present

## 2018-04-08 DIAGNOSIS — C50412 Malignant neoplasm of upper-outer quadrant of left female breast: Secondary | ICD-10-CM

## 2018-04-08 DIAGNOSIS — Z79899 Other long term (current) drug therapy: Secondary | ICD-10-CM | POA: Diagnosis not present

## 2018-04-08 DIAGNOSIS — Z853 Personal history of malignant neoplasm of breast: Secondary | ICD-10-CM

## 2018-04-08 DIAGNOSIS — D509 Iron deficiency anemia, unspecified: Secondary | ICD-10-CM | POA: Insufficient documentation

## 2018-04-08 DIAGNOSIS — E785 Hyperlipidemia, unspecified: Secondary | ICD-10-CM | POA: Diagnosis not present

## 2018-04-08 DIAGNOSIS — Z170 Estrogen receptor positive status [ER+]: Secondary | ICD-10-CM

## 2018-04-08 NOTE — Patient Instructions (Signed)
Steps to Quit Smoking Smoking tobacco can be bad for your health. It can also affect almost every organ in your body. Smoking puts you and people around you at risk for many serious long-lasting (chronic) diseases. Quitting smoking is hard, but it is one of the best things that you can do for your health. It is never too late to quit. What are the benefits of quitting smoking? When you quit smoking, you lower your risk for getting serious diseases and conditions. They can include:  Lung cancer or lung disease.  Heart disease.  Stroke.  Heart attack.  Not being able to have children (infertility).  Weak bones (osteoporosis) and broken bones (fractures).  If you have coughing, wheezing, and shortness of breath, those symptoms may get better when you quit. You may also get sick less often. If you are pregnant, quitting smoking can help to lower your chances of having a baby of low birth weight. What can I do to help me quit smoking? Talk with your doctor about what can help you quit smoking. Some things you can do (strategies) include:  Quitting smoking totally, instead of slowly cutting back how much you smoke over a period of time.  Going to in-person counseling. You are more likely to quit if you go to many counseling sessions.  Using resources and support systems, such as: ? Online chats with a counselor. ? Phone quitlines. ? Printed self-help materials. ? Support groups or group counseling. ? Text messaging programs. ? Mobile phone apps or applications.  Taking medicines. Some of these medicines may have nicotine in them. If you are pregnant or breastfeeding, do not take any medicines to quit smoking unless your doctor says it is okay. Talk with your doctor about counseling or other things that can help you.  Talk with your doctor about using more than one strategy at the same time, such as taking medicines while you are also going to in-person counseling. This can help make  quitting easier. What things can I do to make it easier to quit? Quitting smoking might feel very hard at first, but there is a lot that you can do to make it easier. Take these steps:  Talk to your family and friends. Ask them to support and encourage you.  Call phone quitlines, reach out to support groups, or work with a counselor.  Ask people who smoke to not smoke around you.  Avoid places that make you want (trigger) to smoke, such as: ? Bars. ? Parties. ? Smoke-break areas at work.  Spend time with people who do not smoke.  Lower the stress in your life. Stress can make you want to smoke. Try these things to help your stress: ? Getting regular exercise. ? Deep-breathing exercises. ? Yoga. ? Meditating. ? Doing a body scan. To do this, close your eyes, focus on one area of your body at a time from head to toe, and notice which parts of your body are tense. Try to relax the muscles in those areas.  Download or buy apps on your mobile phone or tablet that can help you stick to your quit plan. There are many free apps, such as QuitGuide from the CDC (Centers for Disease Control and Prevention). You can find more support from smokefree.gov and other websites.  This information is not intended to replace advice given to you by your health care provider. Make sure you discuss any questions you have with your health care provider. Document Released: 03/08/2009 Document   Revised: 01/08/2016 Document Reviewed: 09/26/2014 Elsevier Interactive Patient Education  2018 [place] protect organs, store calcium, and anchor muscles. Good health habits, such as eating nutritious foods and exercising regularly, are important for maintaining healthy bones. They can also help to prevent a condition that causes bones to lose density and become weak and brittle (osteoporosis). Why is bone mass important? Bone mass refers to the amount of bone tissue that you have. The higher your  bone mass, the stronger your bones. An important step toward having healthy bones throughout life is to have strong and dense bones during childhood. A young adult who has a high bone mass is more likely to have a high bone mass later in life. Bone mass at its greatest it is called peak bone mass. A large decline in bone mass occurs in older adults. In women, it occurs about the time of menopause. During this time, it is important to practice good health habits, because if more bone is lost than what is replaced, the bones will become less healthy and more likely to break (fracture). If you find that you have a low bone mass, you may be able to prevent osteoporosis or further bone loss by changing your diet and lifestyle. How can I find out if my bone mass is low? Bone mass can be measured with an X-ray test that is called a bone mineral density (BMD) test. This test is recommended for all women who are age 63 or older. It may also be recommended for men who are age 56 or older, or for people who are more likely to develop osteoporosis due to:  Having bones that break easily.  Having a long-term disease that weakens bones, such as kidney disease or rheumatoid arthritis.  Having menopause earlier than normal.  Taking medicine that weakens bones, such as steroids, thyroid hormones, or hormone treatment for breast cancer or prostate cancer.  Smoking.  Drinking three or more alcoholic drinks each day.  What are the nutritional recommendations for healthy bones? To have healthy bones, you need to get enough of the right minerals and vitamins. Most nutrition experts recommend getting these nutrients from the foods that you eat. Nutritional recommendations vary from person to person. Ask your health care provider what is healthy for you. Here are some general guidelines. Calcium Recommendations Calcium is the most important (essential) mineral for bone health. Most people can get enough calcium from  their diet, but supplements may be recommended for people who are at risk for osteoporosis. Good sources of calcium include:  Dairy products, such as low-fat or nonfat milk, cheese, and yogurt.  Dark green leafy vegetables, such as bok choy and broccoli.  Calcium-fortified foods, such as orange juice, cereal, bread, soy beverages, and tofu products.  Nuts, such as almonds.  Follow these recommended amounts for daily calcium intake:  Children, age 50?3: 700 mg.  Children, age 5?8: 1,000 mg.  Children, age 71?13: 1,300 mg.  Teens, age 9?18: 1,300 mg.  Adults, age 53?50: 1,000 mg.  Adults, age 7?70: ? Men: 1,000 mg. ? Women: 1,200 mg.  Adults, age 58 or older: 1,200 mg.  Pregnant and breastfeeding females: ? Teens: 1,300 mg. ? Adults: 1,000 mg.  Vitamin D Recommendations Vitamin D is the most essential vitamin for bone health. It helps the body to absorb calcium. Sunlight stimulates the skin to make vitamin D, so be sure to get enough sunlight. If you live in a cold climate or you do  not get outside often, your health care provider may recommend that you take vitamin D supplements. Good sources of vitamin D in your diet include:  Egg yolks.  Saltwater fish.  Milk and cereal fortified with vitamin D.  Follow these recommended amounts for daily vitamin D intake:  Children and teens, age 84?18: 62 international units.  Adults, age 22 or younger: 400-800 international units.  Adults, age 36 or older: 800-1,000 international units.  Other Nutrients Other nutrients for bone health include:  Phosphorus. This mineral is found in meat, poultry, dairy foods, nuts, and legumes. The recommended daily intake for adult men and adult women is 700 mg.  Magnesium. This mineral is found in seeds, nuts, dark green vegetables, and legumes. The recommended daily intake for adult men is 400?420 mg. For adult women, it is 310?320 mg.  Vitamin K. This vitamin is found in green leafy  vegetables. The recommended daily intake is 120 mg for adult men and 90 mg for adult women.  What type of physical activity is best for building and maintaining healthy bones? Weight-bearing and strength-building activities are important for building and maintaining peak bone mass. Weight-bearing activities cause muscles and bones to work against gravity. Strength-building activities increases muscle strength that supports bones. Weight-bearing and muscle-building activities include:  Walking and hiking.  Jogging and running.  Dancing.  Gym exercises.  Lifting weights.  Tennis and racquetball.  Climbing stairs.  Aerobics.  Adults should get at least 30 minutes of moderate physical activity on most days. Children should get at least 60 minutes of moderate physical activity on most days. Ask your health care provide what type of exercise is best for you. Where can I find more information? For more information, check out the following websites:  [place]: YardHomes.se  [employer] of Health: http://www.niams.AnonymousEar.fr.asp  This information is not intended to replace advice given to you by your health care provider. Make sure you discuss any questions you have with your health care provider. Document Released: 08/02/2003 Document Revised: 11/30/2015 Document Reviewed: 05/17/2014 Elsevier Interactive Patient Education  [employer].

## 2018-04-08 NOTE — Progress Notes (Signed)
CLINIC:  Survivorship   REASON FOR VISIT:  Routine follow-up for history of breast cancer.   BRIEF ONCOLOGIC HISTORY:  Per Dr. Virgie Dad last note from 12/11/2015:  1.  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  2.  Status post bilateral breast MRI on 11/18/2010 which noted a history of bilateral silicone breast implants removed in 2010 due to implant rupture.  The MRI also showed a left breast 1.2 cm mass at the 2:30 position, and a 1.1 cm intramammary lymph node on the right breast.    3.  Status post right breast needle core biopsy on 11/20/2010 which showed no evidence of malignancy.  4.  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 The patient started antiestrogen therapy with Tamoxifen in 11/2010, the plan being to continue for total of 5 years (until July 2017)  6.  Osteoporosis with T-2.9 score 03/04/2011 (at [place])   INTERVAL HISTORY:  Tammy Wolfe presents to the [facility] today for routine follow-up for her history of breast cancer.  Overall, she reports feeling quite well. She saw Dr. Donne Hazel and Dr. Jenny Reichmann last week.  She had some labs done and just received them in the mail.  She wants to know if I will explain her LDL/nonHDL goals and what that means.  She has a hemoglobin of 11.1 and h/o iron deficiency.  She is practicing an iron rich diet.  This is managed by Dr. Jenny Reichmann.  She is also due for CT lung cancer screening.  She continues to smoke.  She says she is trying to quit every day.      REVIEW OF SYSTEMS:  Review of Systems    Constitutional: Negative for appetite change, chills, fatigue, fever and unexpected weight change.  HENT:   Negative for hearing loss, lump/mass, sore throat and trouble swallowing.   Eyes: Negative for eye problems and icterus.  Respiratory: Negative for chest tightness, cough and shortness of breath.   Cardiovascular: Negative for chest pain, leg swelling and palpitations.  Gastrointestinal: Negative for abdominal distention, abdominal pain, constipation, diarrhea, nausea and vomiting.  Endocrine: Negative for hot flashes.  Musculoskeletal: Negative for arthralgias.  Skin: Negative for itching and rash.  Neurological: Negative for dizziness, extremity weakness and numbness.  Hematological: Negative for adenopathy. Does not bruise/bleed easily.  Psychiatric/Behavioral: Negative for depression. The patient is not nervous/anxious.         PAST MEDICAL/SURGICAL HISTORY:  Past Medical History:  Diagnosis Date  . ABNORMAL THYROID FUNCTION TESTS 12/10/2009  . ALCOHOLIC HEPATITIS, HX OF 02/14/2007  . ALLERGIC RHINITIS 02/14/2007  . Anemia, unspecified 04/26/2012  . ANXIETY 03/19/2007  . Breast cancer ([place])   . Breast implant removal status 12/23/2008  . COLONIC POLYPS, HX OF 02/14/2007  . Coronary artery calcification seen on CT scan 10/31/2013  . GOITER, MULTINODULAR 02/14/2007  . HX, PERSONAL, ALCOHOLISM 02/14/2007  . HYPERLIPIDEMIA 02/14/2007  . HYPERTENSION 02/14/2007  . Insomnia   . OSTEOARTHRITIS 02/14/2007  . OSTEOPOROSIS 02/14/2007  . PANCREATITIS, HX OF 02/14/2007  . Smoker 09/06/2013   Past Surgical History:  Procedure Laterality Date  . BREAST SURGERY  4742   silicone prepectoral implants explanted by Dr. Towanda Malkin after rupture  . BREAST SURGERY     left simple mastectomy and left axillary sentinel node biopsy, right prophylactic mastectomy  . CATARACT EXTRACTION    . peridontal  2010  . s/p jaw and periodontal surgury  2009     ALLERGIES:  Allergies  Allergen Reactions   . Tetracycline Anaphylaxis  . Lovastatin Other (See Comments)    Muscle cramps  . Pravastatin Other (See Comments)    Leg cramps     CURRENT MEDICATIONS:  Outpatient Encounter Medications as of 04/08/2018  Medication Sig  . ALPRAZolam (XANAX) 0.25 MG tablet 1 tab by mouth three times daily as needed  . Cholecalciferol (VITAMIN D) 1000 UNITS capsule Take 1,000 Units by mouth daily.    Marland Kitchen HYDROcodone-acetaminophen (NORCO/VICODIN) 5-325 MG tablet Take 1 tablet by mouth 2 (two) times daily as needed.  Marland Kitchen losartan (COZAAR) 50 MG tablet Take 1 tablet (50 mg total) by mouth daily.  . potassium chloride (MICRO-K) 10 MEQ CR capsule Take 1 capsule (10 mEq total) by mouth daily.  Marland Kitchen trimethoprim (TRIMPEX) 100 MG tablet Take 1 tablet (100 mg total) by mouth daily.   Facility-Administered Encounter Medications as of 04/08/2018  Medication  . lidocaine (PF) (XYLOCAINE) 1 % injection 0.9 mL     ONCOLOGIC FAMILY HISTORY:  Family History  Adopted: Yes  Problem Relation Age of Onset  . Hyperlipidemia Brother     GENETIC COUNSELING/TESTING: Not at this time  SOCIAL HISTORY:  Tammy Wolfe is divorced and lives alone in [place], [place].  She has 3 children (1 deceased from heart attack at age 76) and they live in [place] and [place].  Tammy Wolfe is currently retired.  She denies any  alcohol, or illicit drug use.  She is a current every day smoker of 1/2 ppd for about 60 years.     PHYSICAL EXAMINATION:  Vital Signs: Vitals:   04/08/18 1104  BP: 135/79  Pulse: 97  Resp: 18  Temp: 98 F (36.7 C)  SpO2: 99%   Filed Weights   04/08/18 1104  Weight: 85 lb 6.4 oz (38.7 kg)   General: Well-nourished, well-appearing female in no acute distress.  Unaccompanied today.   HEENT: Head is normocephalic.  Pupils equal and reactive to light. Conjunctivae clear without exudate.  Sclerae anicteric. Oral mucosa is pink, moist.  Oropharynx is pink without lesions or erythema.  Lymph: No  cervical, supraclavicular, or infraclavicular lymphadenopathy noted on palpation.  Cardiovascular: Regular rate and rhythm.Marland Kitchen Respiratory: Clear to auscultation bilaterally. Chest expansion symmetric; breathing non-labored.  Breast Exam:  -s/p bilateral  Mastectomies no nodularity, swelling, thickness, skin change or sign of recurrence -Axilla: No axillary adenopathy bilaterally. Some very mildly enlarged (shotty) nodules in right anterior axillary fold GI: Abdomen soft and round; non-tender, non-distended. Bowel sounds normoactive. No hepatosplenomegaly.   GU: Deferred.  Neuro: No focal deficits. Steady gait.  Psych: Mood and affect normal and appropriate for situation.  MSK: No focal spinal tenderness to palpation, full range of motion in bilateral upper extremities Extremities: No edema. Skin: Warm and dry.  LABORATORY DATA:  None for this visit   DIAGNOSTIC IMAGING:  Most recent mammogram:  N/a s/p bilateral mastectomies    ASSESSMENT AND PLAN:  Ms.. Wolfe is a pleasant 77 y.o. female with history of Stage IA left breast invasive ductal carcinoma, ER+/PR+/HER2-, diagnosed in 10/2010, treated with bilateral mastectomies with reconstruction and anti estrogen therapy with Tamoxifen x 5  years finishing in 11/2015.  She [facility] for surveillance and routine follow-up.   1. History of breast cancer:  Tammy Wolfe is currently clinically and radiographically without evidence of disease or recurrence of breast cancer. I counseled her that she no longer "needs" to f/u with [place] for evaluation, and that she could graduate from our care, or that she could see either me or Dr. Donne Hazel at this point, instead of both of [place].  She told me that she likes seeing both places and wants to continue to ensure she is cancer free.  I also offered for her to space out the appointments between Dr. Donne Hazel and I, she declined this as well.  I will see her back in one year.  I encouraged her  to call me with any questions or concerns before her next visit at the cancer center, and I would be happy to see her sooner, if needed.    2. Iron deficiency anemia. This is managed by Dr. Jenny Reichmann.  She is not taking oral iron, but is eating an iron rich diet.  She will continue this.     3.  Tobacco use disorder: Patient aware of health risks related to a 60 year smoking history.  She says she is trying to quit.  Right now she says she is using will power.  I offered some tips to assist her, such as finding replacements for smoking during her triggers to reach for a cigarette.  She appreciated this.  I also gave her info in her AVS on quitting smoking.  The patch, and the chantix haven't worked well for her.  .    4. Bone health:  Given Tammy Wolfe's age, history of breast cancer, h/o tobacco use, she is at risk for bone demineralization.  She has a bone density ordered by her PCP.  We will defer to Dr. Jenny Reichmann regarding bone density testing and management.  She notes she has osteoporosis and is careful about not falling. She was given education on specific food and activities to promote bone health.  5. Cancer screening:  Due to Tammy Wolfe's history and her age, she should receive screening for skin cancers, colon cancer, and lung cancer. She was encouraged to follow-up with her PCP for appropriate cancer screenings.   6. Health maintenance and wellness promotion: Tammy Wolfe was encouraged to consume 5-7 servings of fruits and vegetables per day. She was also encouraged to engage in moderate to vigorous exercise for 30 minutes per day most days of the week. She was instructed to limit her alcohol consumption and was encouraged stop smoking.     7. Nodules in right anterior axillary fold: These are very small, and minimally palpable.  I called and reviewed these with Dr. Donne Hazel.  Her labs are normal at this point, this is not the side she had breast cancer on, and for now we will watch them.    Dispo:    -Return to cancer center in one year for LTS follow up     A total of (30) minutes of face-to-face time was spent with this patient with greater than 50% of that time in counseling and care-coordination.   Gardenia Phlegm, [place] [phone]   Note: PRIMARY CARE PROVIDER Biagio Borg, [place] [phone]

## 2018-04-08 NOTE — Telephone Encounter (Signed)
Printed calendar and avs. °

## 2018-05-04 DIAGNOSIS — H52223 Regular astigmatism, bilateral: Secondary | ICD-10-CM | POA: Diagnosis not present

## 2018-07-05 ENCOUNTER — Telehealth: Payer: Self-pay | Admitting: Internal Medicine

## 2018-07-05 NOTE — Telephone Encounter (Signed)
Insurance has been submitted and verified for Prolia. Patient is responsible for a $230 copay. Due anytime. Left message for patient to call back to schedule.  Okay to schedule... Visit Note: Prolia ($230 copay - okay to give per Gareth Eagle) Visit Type: Nurse Provider: Nurse

## 2018-08-18 ENCOUNTER — Encounter: Payer: Self-pay | Admitting: Internal Medicine

## 2018-09-30 ENCOUNTER — Other Ambulatory Visit: Payer: Self-pay | Admitting: Internal Medicine

## 2018-09-30 NOTE — Telephone Encounter (Signed)
Done erx 

## 2019-04-04 NOTE — Progress Notes (Addendum)
Subjective:   Tammy Wolfe is a 78 y.o. female who presents for Medicare Annual (Subsequent) preventive examination.  Review of Systems:   Cardiac Risk Factors include: advanced age (>8men, >50 women);dyslipidemia;hypertension Sleep patterns: feels rested on waking, gets up 1-2 times nightly to void and sleeps 7 hours nightly.    Home Safety/Smoke Alarms: Feels safe in home. Smoke alarms in place.  Living environment; residence and Firearm Safety: [place], can live on one level. Lives alone, no needs for DME, good support system Seat Belt Safety/Bike Helmet: Wears seat belt.     Objective:     Vitals: There were no vitals taken for this visit.  There is no height or weight on file to calculate BMI.  Advanced Directives 04/05/2019 03/30/2018  Does Patient Have a Medical Advance Directive? Yes Yes  Type of Paramedic of [place];Living will [place];Living will  Copy of [place] in Chart? No - copy requested No - copy requested    Tobacco Social History   Tobacco Use  Smoking Status Current Every Day Smoker  . Packs/day: 0.50  . Types: Cigarettes  Smokeless Tobacco Never Used  Tobacco Comment   smoking about 4 cigs/day (as of 04/10/16)-gwd     Ready to quit: Not Answered Counseling given: Not Answered Comment: smoking about 4 cigs/day (as of 04/10/16)-gwd  Past Medical History:  Diagnosis Date  . ABNORMAL THYROID FUNCTION TESTS 12/10/2009  . ALCOHOLIC HEPATITIS, HX OF 02/14/2007  . ALLERGIC RHINITIS 02/14/2007  . Anemia, unspecified 04/26/2012  . ANXIETY 03/19/2007  . Breast cancer ([place])   . Breast implant removal status 12/23/2008  . COLONIC POLYPS, HX OF 02/14/2007  . Coronary artery calcification seen on CT scan 10/31/2013  . GOITER, MULTINODULAR 02/14/2007  . HX, PERSONAL, ALCOHOLISM 02/14/2007  . HYPERLIPIDEMIA 02/14/2007  . HYPERTENSION 02/14/2007  . Insomnia   . OSTEOARTHRITIS 02/14/2007  .  OSTEOPOROSIS 02/14/2007  . PANCREATITIS, HX OF 02/14/2007  . Smoker 09/06/2013   Past Surgical History:  Procedure Laterality Date  . BREAST SURGERY  [id number]   silicone prepectoral implants explanted by Dr. Towanda Malkin after rupture  . BREAST SURGERY     left simple mastectomy and left axillary sentinel node biopsy, right prophylactic mastectomy  . CATARACT EXTRACTION    . peridontal  2010  . s/p jaw and periodontal surgury  2009   Family History  Adopted: Yes  Problem Relation Age of Onset  . Hyperlipidemia Brother    Social History   Socioeconomic History  . Marital status: Divorced    Spouse name: Not on file  . Number of children: 3  . Years of education: Not on file  . Highest education level: Not on file  Occupational History  . Occupation: retired Psychologist, sport and exercise  Social Needs  . Financial resource strain: Not hard at all  . Food insecurity    Worry: Never true    Inability: Never true  . Transportation needs    Medical: No    Non-medical: No  Tobacco Use  . Smoking status: Current Every Day Smoker    Packs/day: 0.50    Types: Cigarettes  . Smokeless tobacco: Never Used  . Tobacco comment: smoking about 4 cigs/day (as of 04/10/16)-gwd  Substance and Sexual Activity  . Alcohol use: Not Currently    Alcohol/week: 0.0 standard drinks    Comment: one glass/ occasional/ no drinking now  . Drug use: No  . Sexual activity: Not Currently  Lifestyle  .  Physical activity    Days per week: 2 days    Minutes per session: 30 min  . Stress: Only a little  Relationships  . Social connections    Talks on phone: More than three times a week    Gets together: More than three times a week    Attends religious service: 1 to 4 times per year    Active member of club or organization: Yes    Attends meetings of clubs or organizations: More than 4 times per year    Relationship status: Divorced  Other Topics Concern  . Not on file  Social History Narrative  . Not on file     Outpatient Encounter Medications as of 04/05/2019  Medication Sig  . Cholecalciferol (VITAMIN D) 1000 UNITS capsule Take 1,000 Units by mouth daily.    Marland Kitchen losartan (COZAAR) 50 MG tablet Take 1 tablet (50 mg total) by mouth daily.  . potassium chloride (MICRO-K) 10 MEQ CR capsule Take 1 capsule (10 mEq total) by mouth daily.  Marland Kitchen trimethoprim (TRIMPEX) 100 MG tablet Take 1 tablet (100 mg total) by mouth daily.  . [DISCONTINUED] ALPRAZolam (XANAX) 0.25 MG tablet TAKE ONE TABLET THREE TIMES DAILY AS NEEDED  . [DISCONTINUED] HYDROcodone-acetaminophen (NORCO/VICODIN) 5-325 MG tablet Take 1 tablet by mouth 2 (two) times daily as needed.  . [DISCONTINUED] losartan (COZAAR) 50 MG tablet Take 1 tablet (50 mg total) by mouth daily.   Facility-Administered Encounter Medications as of 04/05/2019  Medication  . lidocaine (PF) (XYLOCAINE) 1 % injection 0.9 mL    Activities of Daily Living In your present state of health, do you have any difficulty performing the following activities: 04/05/2019  Hearing? N  Vision? N  Difficulty concentrating or making decisions? N  Walking or climbing stairs? N  Dressing or bathing? N  Doing errands, shopping? N  Preparing Food and eating ? N  Using the Toilet? N  In the past six months, have you accidently leaked urine? N  Do you have problems with loss of bowel control? N  Managing your Medications? N  Managing your Finances? N  Housekeeping or managing your Housekeeping? N  Some recent data might be hidden    Patient Care Team: Biagio Borg, MD as PCP - General Bjorn Loser, MD as Consulting Physician (Urology) Luberta Mutter, MD as Consulting Physician (Ophthalmology) Magrinat, Virgie Dad, MD as Consulting Physician (Oncology)    Assessment:   This is a routine wellness examination for Tammy Wolfe. Physical assessment deferred to PCP.  Exercise Activities and Dietary recommendations Current Exercise Habits: Home exercise routine, Type of exercise:  walking, Time (Minutes): 30, Frequency (Times/Week): 2, Weekly Exercise (Minutes/Week): 60, Exercise limited by: orthopedic condition(s)  Diet (meal preparation, eat out, water intake, caffeinated beverages, dairy products, fruits and vegetables): in general, a "healthy" diet   Reports having a good appetite and states she has been extremely thin all of her life.  Reviewed heart healthy diet. Encouraged patient to increase daily water and healthy fluid intake.  Goals      Patient Stated   . patient (pt-stated)     Wants to be healthy as she can be      Other   . Patient Stated     Continue to be as healthy and as independent as possible. Love family and enjoy life.       Fall Risk Fall Risk  04/05/2019 03/30/2018 03/30/2018 03/26/2017 03/14/2015  Falls in the past year? 1 0 0 No No  Number falls in past yr: 0 - - - -  Comment fell up the stairs with 2 black eyes and sore still all over - - - -  Injury with Fall? 0 - - - -  Follow up - Falls prevention discussed - - -   Is the patient's home free of loose throw rugs in walkways, pet beds, electrical cords, etc?   yes      Grab bars in the bathroom? yes      Handrails on the stairs?   yes      Adequate lighting?   yes  Depression Screen PHQ 2/9 Scores 04/05/2019 03/30/2018 03/30/2018 03/26/2017  PHQ - 2 Score 1 0 0 0  PHQ- 9 Score 2 - - -     Cognitive Function MMSE - Mini Mental State Exam 03/30/2018  Orientation to time 5  Orientation to Place 5  Registration 3  Attention/ Calculation 5  Recall 2  Language- name 2 objects 2  Language- repeat 1  Language- follow 3 step command 3  Language- read & follow direction 1  Write a sentence 1  Copy design 1  Total score 29       Ad8 score reviewed for issues:  Issues making decisions: no  Less interest in hobbies / activities: no  Repeats questions, stories (family complaining): no  Trouble using ordinary gadgets (microwave, computer, phone):no  Forgets the month or  year: no  Mismanaging finances: no  Remembering appts: no  Daily problems with thinking and/or memory: no Ad8 score is= 0  Immunization History  Administered Date(s) Administered  . Fluad Quad(high Dose 65+) 04/05/2019  . Influenza Whole 03/19/2007  . Influenza, High Dose Seasonal PF 03/08/2013, 03/07/2014, 03/14/2015, 03/26/2017, 03/30/2018  . Influenza,inj,Quad PF,6+ Mos 03/12/2016  . Pneumococcal Conjugate-13 03/29/2013  . Pneumococcal Polysaccharide-23 01/21/2006  . Td 11/12/2009  . Zoster 01/21/2006   Screening Tests Health Maintenance  Topic Date Due  . INFLUENZA VACCINE  12/25/2018  . TETANUS/TDAP  11/13/2019  . DEXA SCAN  Completed  . PNA vac Low Risk Adult  Completed      Plan:     Reviewed health maintenance screenings with patient today and relevant education, vaccines, and/or referrals were provided.   I have personally reviewed and noted the following in the patient's chart:   . Medical and social history . Use of alcohol, tobacco or illicit drugs  . Current medications and supplements . Functional ability and status . Nutritional status . Physical activity . Advanced directives . List of other physicians . Vitals . Screenings to include cognitive, depression, and falls . Referrals and appointments  In addition, I have reviewed and discussed with patient certain preventive protocols, quality metrics, and best practice recommendations. A written personalized care plan for preventive services as well as general preventive health recommendations were provided to patient.     Michiel Cowboy, RN  04/05/2019    Medical screening examination/treatment/procedure(s) were performed by non-physician practitioner and as supervising physician I was immediately available for consultation/collaboration. I agree with above. Cathlean Cower, MD

## 2019-04-05 ENCOUNTER — Ambulatory Visit (INDEPENDENT_AMBULATORY_CARE_PROVIDER_SITE_OTHER): Payer: Medicare Other | Admitting: *Deleted

## 2019-04-05 ENCOUNTER — Encounter: Payer: Self-pay | Admitting: Internal Medicine

## 2019-04-05 ENCOUNTER — Other Ambulatory Visit (INDEPENDENT_AMBULATORY_CARE_PROVIDER_SITE_OTHER): Payer: Medicare Other

## 2019-04-05 ENCOUNTER — Ambulatory Visit (INDEPENDENT_AMBULATORY_CARE_PROVIDER_SITE_OTHER): Payer: Medicare Other | Admitting: Internal Medicine

## 2019-04-05 ENCOUNTER — Other Ambulatory Visit: Payer: Self-pay

## 2019-04-05 VITALS — BP 126/84 | HR 121 | Ht 64.0 in | Wt 85.0 lb

## 2019-04-05 VITALS — BP 126/84 | HR 121 | Temp 98.2°F | Ht 64.0 in | Wt 85.0 lb

## 2019-04-05 DIAGNOSIS — E611 Iron deficiency: Secondary | ICD-10-CM

## 2019-04-05 DIAGNOSIS — Z23 Encounter for immunization: Secondary | ICD-10-CM

## 2019-04-05 DIAGNOSIS — E559 Vitamin D deficiency, unspecified: Secondary | ICD-10-CM | POA: Diagnosis not present

## 2019-04-05 DIAGNOSIS — R7302 Impaired glucose tolerance (oral): Secondary | ICD-10-CM | POA: Diagnosis not present

## 2019-04-05 DIAGNOSIS — Z0000 Encounter for general adult medical examination without abnormal findings: Secondary | ICD-10-CM

## 2019-04-05 DIAGNOSIS — E538 Deficiency of other specified B group vitamins: Secondary | ICD-10-CM | POA: Diagnosis not present

## 2019-04-05 DIAGNOSIS — F17200 Nicotine dependence, unspecified, uncomplicated: Secondary | ICD-10-CM | POA: Diagnosis not present

## 2019-04-05 LAB — BASIC METABOLIC PANEL
BUN: 24 mg/dL — ABNORMAL HIGH (ref 6–23)
CO2: 18 mEq/L — ABNORMAL LOW (ref 19–32)
Calcium: 9.7 mg/dL (ref 8.4–10.5)
Chloride: 103 mEq/L (ref 96–112)
Creatinine, Ser: 1.22 mg/dL — ABNORMAL HIGH (ref 0.40–1.20)
GFR: 42.55 mL/min — ABNORMAL LOW (ref >60.00)
Glucose, Bld: 105 mg/dL — ABNORMAL HIGH (ref 70–99)
Potassium: 3.8 mEq/L (ref 3.5–5.1)
Sodium: 131 mEq/L — ABNORMAL LOW (ref 135–145)

## 2019-04-05 LAB — CBC WITH DIFFERENTIAL/PLATELET
Basophils Absolute: 0.1 10*3/uL (ref 0.0–0.1)
Basophils Relative: 0.6 % (ref 0.0–3.0)
Eosinophils Absolute: 0 10*3/uL (ref 0.0–0.7)
Eosinophils Relative: 0.2 % (ref 0.0–5.0)
HCT: 35.4 % — ABNORMAL LOW (ref 36.0–46.0)
Hemoglobin: 11.5 g/dL — ABNORMAL LOW (ref 12.0–15.0)
Lymphocytes Relative: 10.3 % — ABNORMAL LOW (ref 12.0–46.0)
Lymphs Abs: 1.2 10*3/uL (ref 0.7–4.0)
MCHC: 32.5 g/dL (ref 30.0–36.0)
MCV: 85.9 fl (ref 78.0–100.0)
Monocytes Absolute: 0.6 10*3/uL (ref 0.1–1.0)
Monocytes Relative: 5.1 % (ref 3.0–12.0)
Neutro Abs: 10.1 10*3/uL — ABNORMAL HIGH (ref 1.4–7.7)
Neutrophils Relative %: 83.8 % — ABNORMAL HIGH (ref 43.0–77.0)
Platelets: 369 10*3/uL (ref 150.0–400.0)
RBC: 4.12 Mil/uL (ref 3.87–5.11)
RDW: 14.9 % (ref 11.5–15.5)
WBC: 12 10*3/uL — ABNORMAL HIGH (ref 4.0–10.5)

## 2019-04-05 LAB — HEMOGLOBIN A1C: Hgb A1c MFr Bld: 5.4 % (ref 4.6–6.5)

## 2019-04-05 LAB — LIPID PANEL
Cholesterol: 171 mg/dL (ref 0–200)
HDL: 79.5 mg/dL (ref >39.00)
LDL Cholesterol: 73 mg/dL (ref 0–99)
NonHDL: 91.36
Total CHOL/HDL Ratio: 2
Triglycerides: 91 mg/dL (ref 0.0–149.0)
VLDL: 18.2 mg/dL (ref 0.0–40.0)

## 2019-04-05 LAB — IBC PANEL
Iron: 80 ug/dL (ref 42–145)
Saturation Ratios: 26.8 % (ref 20.0–50.0)
Transferrin: 213 mg/dL (ref 212.0–360.0)

## 2019-04-05 LAB — HEPATIC FUNCTION PANEL
ALT: 12 U/L (ref 0–35)
AST: 19 U/L (ref 0–37)
Albumin: 4.2 g/dL (ref 3.5–5.2)
Alkaline Phosphatase: 98 U/L (ref 39–117)
Bilirubin, Direct: 0.1 mg/dL (ref 0.0–0.3)
Total Bilirubin: 0.3 mg/dL (ref 0.2–1.2)
Total Protein: 7.4 g/dL (ref 6.0–8.3)

## 2019-04-05 LAB — URINALYSIS, ROUTINE W REFLEX MICROSCOPIC
Bilirubin Urine: NEGATIVE
Ketones, ur: NEGATIVE
Leukocytes,Ua: NEGATIVE
Nitrite: NEGATIVE
Specific Gravity, Urine: <=1.005 — AB (ref 1.000–1.030)
Total Protein, Urine: NEGATIVE
Urine Glucose: NEGATIVE
Urobilinogen, UA: 0.2 (ref 0.0–1.0)
pH: 6 (ref 5.0–8.0)

## 2019-04-05 LAB — VITAMIN D 25 HYDROXY (VIT D DEFICIENCY, FRACTURES): VITD: 69.61 ng/mL (ref 30.00–100.00)

## 2019-04-05 LAB — VITAMIN B12: Vitamin B-12: 559 pg/mL (ref 211–911)

## 2019-04-05 LAB — TSH: TSH: 0.45 u[IU]/mL (ref 0.35–4.50)

## 2019-04-05 MED ORDER — ALPRAZOLAM 0.25 MG PO TABS: ORAL_TABLET | ORAL | 5 refills | Status: DC

## 2019-04-05 MED ORDER — HYDROCODONE-ACETAMINOPHEN 5-325 MG PO TABS: 1.0000 | ORAL_TABLET | Freq: Two times a day (BID) | ORAL | 0 refills | Status: DC | PRN

## 2019-04-05 MED ORDER — ZOSTER VAC RECOMB ADJUVANTED 50 MCG/0.5ML IM SUSR: 0.5000 mL | Freq: Once | INTRAMUSCULAR | 1 refills | Status: AC

## 2019-04-05 MED ORDER — LOSARTAN POTASSIUM 50 MG PO TABS: 50.0000 mg | ORAL_TABLET | Freq: Every day | ORAL | 3 refills | Status: DC

## 2019-04-05 NOTE — Assessment & Plan Note (Signed)
stable overall by history and exam, recent data reviewed with pt, and pt to continue medical treatment as before,  to f/u any worsening symptoms or concerns  

## 2019-04-05 NOTE — Patient Instructions (Addendum)
You had the flu shot today  Please continue all other medications as before, and refills have been done if requested - the vicodin for painm xanax, BP meds  Please quit smoking  Please have the pharmacy call with any other refills you may need.  Please continue your efforts at being more active, low cholesterol diet, and weight control.  You are otherwise up to date with prevention measures today.  Please keep your appointments with your specialists as you may have planned  Please go to the LAB in the Basement (turn left off the elevator) for the tests to be done today  You will be contacted by phone if any changes need to be made immediately.  Otherwise, you will receive a letter about your results with an explanation, but please check with MyChart first.  Please remember to sign up for MyChart if you have not done so, as this will be important to you in the future with finding out test results, communicating by private email, and scheduling acute appointments online when needed.  Please return in 1 year for your yearly visit, or sooner if needed, with Lab testing done 3-5 days before

## 2019-04-05 NOTE — Assessment & Plan Note (Signed)
Urged to quit again,  to f/u any worsening symptoms or concerns

## 2019-04-05 NOTE — Progress Notes (Signed)
Subjective:    Patient ID: Tammy Wolfe, female    DOB: 11/24/1940, 78 y.o.   MRN: [id number]  HPI   Here for wellness and f/u;  Overall doing ok;  Pt denies Chest pain, worsening SOB, DOE, wheezing, orthopnea, PND, worsening LE edema, palpitations, dizziness or syncope.  Pt denies neurological change such has new headache, facial or extremity weakness.  Pt denies polydipsia, polyuria, or low sugar symptoms. Pt states overall good compliance with treatment and medications, good tolerability, and has been trying to follow appropriate diet.  Pt denies worsening depressive symptoms, suicidal ideation or panic. No fever, night sweats, wt loss, loss of appetite, or other constitutional symptoms.  Pt states good ability with ADL's, has low fall risk, home safety reviewed and adequate, no other significant changes in hearing or vision, and only occasionally active with exercise.Fell up stairs one wk ago with some bruising to the maxillary sinus areas but no other injury. Asks for yearly vicodin refill.  Due for lfu shot Past Medical History:  Diagnosis Date  . ABNORMAL THYROID FUNCTION TESTS 12/10/2009  . ALCOHOLIC HEPATITIS, HX OF 02/14/2007  . ALLERGIC RHINITIS 02/14/2007  . Anemia, unspecified 04/26/2012  . ANXIETY 03/19/2007  . Breast cancer ([place])   . Breast implant removal status 12/23/2008  . COLONIC POLYPS, HX OF 02/14/2007  . Coronary artery calcification seen on CT scan 10/31/2013  . GOITER, MULTINODULAR 02/14/2007  . HX, PERSONAL, ALCOHOLISM 02/14/2007  . HYPERLIPIDEMIA 02/14/2007  . HYPERTENSION 02/14/2007  . Insomnia   . OSTEOARTHRITIS 02/14/2007  . OSTEOPOROSIS 02/14/2007  . PANCREATITIS, HX OF 02/14/2007  . Smoker 09/06/2013   Past Surgical History:  Procedure Laterality Date  . BREAST SURGERY  [id number]   silicone prepectoral implants explanted by Dr. Towanda Malkin after rupture  . BREAST SURGERY     left simple mastectomy and left axillary sentinel node biopsy, right prophylactic mastectomy  .  CATARACT EXTRACTION    . peridontal  2010  . s/p jaw and periodontal surgury  2009    reports that she has been smoking cigarettes. She has been smoking about 0.50 packs per day. She has never used smokeless tobacco. She reports current alcohol use. She reports that she does not use drugs. family history includes Hyperlipidemia in her brother. She was adopted. Allergies  Allergen Reactions  . Tetracycline Anaphylaxis  . Lovastatin Other (See Comments)    Muscle cramps  . Pravastatin Other (See Comments)    Leg cramps   Current Outpatient Medications on File Prior to Visit  Medication Sig Dispense Refill  . Cholecalciferol (VITAMIN D) 1000 UNITS capsule Take 1,000 Units by mouth daily.      . potassium chloride (MICRO-K) 10 MEQ CR capsule Take 1 capsule (10 mEq total) by mouth daily. 90 capsule 3  . trimethoprim (TRIMPEX) 100 MG tablet Take 1 tablet (100 mg total) by mouth daily. 90 tablet 3   Current Facility-Administered Medications on File Prior to Visit  Medication Dose Route Frequency Provider Last Rate Last Dose  . lidocaine (PF) (XYLOCAINE) 1 % injection 0.9 mL  0.9 mL Other Once Plotnikov, Evie Lacks, MD        Review of Systems Constitutional: Negative for other unusual diaphoresis, sweats, appetite or weight changes HENT: Negative for other worsening hearing loss, ear pain, facial swelling, mouth sores or neck stiffness.   Eyes: Negative for other worsening pain, redness or other visual disturbance.  Respiratory: Negative for other stridor or swelling Cardiovascular: Negative for other palpitations or other  chest pain  Gastrointestinal: Negative for worsening diarrhea or loose stools, blood in stool, distention or other pain Genitourinary: Negative for hematuria, flank pain or other change in urine volume.  Musculoskeletal: Negative for myalgias or other joint swelling.  Skin: Negative for other color change, or other wound or worsening drainage.  Neurological: Negative for  other syncope or numbness. Hematological: Negative for other adenopathy or swelling Psychiatric/Behavioral: Negative for hallucinations, other worsening agitation, SI, self-injury, or new decreased concentration All otherwise neg per pt     Objective:   Physical Exam BP 126/84   Pulse (!) 121   Temp 98.2 F (36.8 C) (Oral)   Ht 5\' 4"  (1.626 m)   Wt 85 lb (38.6 kg)   SpO2 97%   BMI 14.59 kg/m  VS noted,  Constitutional: Pt is oriented to person, place, and time. Appears well-developed and well-nourished, in no significant distress and comfortable Head: Normocephalic and atraumatic  Eyes: Conjunctivae and EOM are normal. Pupils are equal, round, and reactive to light Right Ear: External ear normal without discharge Left Ear: External ear normal without discharge Nose: Nose without discharge or deformity Mouth/Throat: Oropharynx is without other ulcerations and moist  Neck: Normal range of motion. Neck supple. No JVD present. No tracheal deviation present or significant neck LA or mass Cardiovascular: Normal rate, regular rhythm, normal heart sounds and intact distal pulses.   Pulmonary/Chest: WOB normal and breath sounds decresased bilat without rales or wheezing  Abdominal: Soft. Bowel sounds are normal. NT. No HSM  Musculoskeletal: Normal range of motion. Exhibits no edema Lymphadenopathy: Has no other cervical adenopathy.  Neurological: Pt is alert and oriented to person, place, and time. Pt has normal reflexes. No cranial nerve deficit. Motor grossly intact, Gait intact Skin: Skin is warm and dry. No rash noted or new ulcerations Psychiatric:  Has normal mood and affect. Behavior is normal without agitation All otherwise neg per pt Lab Results  Component Value Date   WBC 7.8 03/30/2018   HGB 11.1 (L) 03/30/2018   HCT 33.5 (L) 03/30/2018   PLT 311.0 03/30/2018   GLUCOSE 101 (H) 03/30/2018   CHOL 149 03/30/2018   TRIG 70.0 03/30/2018   HDL 70.00 03/30/2018   LDLCALC 65  03/30/2018   ALT 11 03/30/2018   AST 24 03/30/2018   NA 133 (L) 03/30/2018   K 3.7 03/30/2018   CL 104 03/30/2018   CREATININE 1.07 03/30/2018   BUN 20 03/30/2018   CO2 21 03/30/2018   TSH 0.45 03/30/2018   HGBA1C 5.4 03/30/2018         Assessment & Plan:

## 2019-04-07 ENCOUNTER — Encounter: Payer: Self-pay | Admitting: Internal Medicine

## 2019-04-12 ENCOUNTER — Encounter: Payer: Medicare Other | Admitting: Adult Health

## 2019-04-19 ENCOUNTER — Ambulatory Visit
Admission: RE | Admit: 2019-04-19 | Discharge: 2019-04-19 | Disposition: A | Discharge status: Home / Self Care | Payer: Medicare Other | Source: Ambulatory Visit | Attending: General Surgery | Admitting: General Surgery

## 2019-04-19 ENCOUNTER — Other Ambulatory Visit: Payer: Self-pay | Admitting: General Surgery

## 2019-04-19 DIAGNOSIS — M25552 Pain in left hip: Secondary | ICD-10-CM | POA: Diagnosis not present

## 2019-04-19 DIAGNOSIS — C50912 Malignant neoplasm of unspecified site of left female breast: Secondary | ICD-10-CM | POA: Diagnosis not present

## 2019-04-19 DIAGNOSIS — S79912A Unspecified injury of left hip, initial encounter: Secondary | ICD-10-CM | POA: Diagnosis not present

## 2019-04-28 DIAGNOSIS — R350 Frequency of micturition: Secondary | ICD-10-CM | POA: Diagnosis not present

## 2019-04-28 DIAGNOSIS — R351 Nocturia: Secondary | ICD-10-CM | POA: Diagnosis not present

## 2019-05-04 DIAGNOSIS — H52223 Regular astigmatism, bilateral: Secondary | ICD-10-CM | POA: Diagnosis not present

## 2019-07-21 ENCOUNTER — Telehealth: Payer: Self-pay | Admitting: *Deleted

## 2019-07-21 NOTE — Telephone Encounter (Signed)
Scheduled 1st Covid 19 vaccine for 2/28@8 :15a at [facility].

## 2019-07-24 ENCOUNTER — Other Ambulatory Visit: Payer: Self-pay

## 2019-07-24 ENCOUNTER — Ambulatory Visit: Payer: Medicare Other | Attending: Internal Medicine

## 2019-07-24 DIAGNOSIS — Z23 Encounter for immunization: Secondary | ICD-10-CM | POA: Insufficient documentation

## 2019-07-24 NOTE — Progress Notes (Signed)
   Covid-19 Vaccination Clinic  Name:  Tammy Wolfe    MRN: [id number] DOB: 03/10/1941  07/24/2019  [name] was observed post Covid-19 immunization for 15 minutes without incidence. She was provided with Vaccine Information Sheet and instruction to access the V-Safe system.   [name] was instructed to call 911 with any severe reactions post vaccine: Marland Kitchen Difficulty breathing  . Swelling of your face and throat  . A fast heartbeat  . A bad rash all over your body  . Dizziness and weakness    Immunizations Administered    Name Date Dose VIS Date Route   Pfizer COVID-19 Vaccine 07/24/2019  8:26 AM 0.3 mL 05/06/2019 Intramuscular   Manufacturer: [place]   Lot: [id number]   [place]: [id number]

## 2019-08-17 ENCOUNTER — Ambulatory Visit: Payer: Medicare Other | Attending: Internal Medicine

## 2019-08-17 DIAGNOSIS — Z23 Encounter for immunization: Secondary | ICD-10-CM

## 2019-08-17 NOTE — Progress Notes (Signed)
   Covid-19 Vaccination Clinic  Name:  Tammy Wolfe    MRN: [id number] DOB: 16-Mar-1941  08/17/2019  [name] was observed post Covid-19 immunization for 30 minutes based on pre-vaccination screening without incident. She was provided with Vaccine Information Sheet and instruction to access the V-Safe system.   [name] was instructed to call 911 with any severe reactions post vaccine: Marland Kitchen Difficulty breathing  . Swelling of face and throat  . A fast heartbeat  . A bad rash all over body  . Dizziness and weakness   Immunizations Administered    Name Date Dose VIS Date Route   Pfizer COVID-19 Vaccine 08/17/2019  8:17 AM 0.3 mL 05/06/2019 Intramuscular   Manufacturer: [place]   Lot: [id number]   [place]: [id number]

## 2019-10-06 ENCOUNTER — Other Ambulatory Visit: Payer: Self-pay | Admitting: Internal Medicine

## 2019-10-06 NOTE — Telephone Encounter (Signed)
Done erx 

## 2020-04-05 ENCOUNTER — Encounter: Payer: Medicare Other | Admitting: Internal Medicine

## 2020-04-25 ENCOUNTER — Other Ambulatory Visit: Payer: Self-pay

## 2020-04-25 ENCOUNTER — Ambulatory Visit (INDEPENDENT_AMBULATORY_CARE_PROVIDER_SITE_OTHER): Payer: Medicare Other | Admitting: Internal Medicine

## 2020-04-25 ENCOUNTER — Encounter: Payer: Self-pay | Admitting: Internal Medicine

## 2020-04-25 VITALS — BP 120/90 | HR 114 | Temp 98.1°F | Ht 64.0 in | Wt 85.0 lb

## 2020-04-25 DIAGNOSIS — E559 Vitamin D deficiency, unspecified: Secondary | ICD-10-CM | POA: Diagnosis not present

## 2020-04-25 DIAGNOSIS — Z23 Encounter for immunization: Secondary | ICD-10-CM

## 2020-04-25 DIAGNOSIS — I10 Essential (primary) hypertension: Secondary | ICD-10-CM

## 2020-04-25 DIAGNOSIS — E876 Hypokalemia: Secondary | ICD-10-CM

## 2020-04-25 DIAGNOSIS — Z1159 Encounter for screening for other viral diseases: Secondary | ICD-10-CM

## 2020-04-25 DIAGNOSIS — E7849 Other hyperlipidemia: Secondary | ICD-10-CM

## 2020-04-25 DIAGNOSIS — Z0001 Encounter for general adult medical examination with abnormal findings: Secondary | ICD-10-CM

## 2020-04-25 DIAGNOSIS — F411 Generalized anxiety disorder: Secondary | ICD-10-CM

## 2020-04-25 DIAGNOSIS — G8929 Other chronic pain: Secondary | ICD-10-CM | POA: Insufficient documentation

## 2020-04-25 DIAGNOSIS — E538 Deficiency of other specified B group vitamins: Secondary | ICD-10-CM

## 2020-04-25 DIAGNOSIS — J449 Chronic obstructive pulmonary disease, unspecified: Secondary | ICD-10-CM

## 2020-04-25 DIAGNOSIS — R7302 Impaired glucose tolerance (oral): Secondary | ICD-10-CM | POA: Diagnosis not present

## 2020-04-25 DIAGNOSIS — Z0000 Encounter for general adult medical examination without abnormal findings: Secondary | ICD-10-CM | POA: Diagnosis not present

## 2020-04-25 LAB — LIPID PANEL
Cholesterol: 160 mg/dL (ref 0–200)
HDL: 90.6 mg/dL (ref >39.00)
LDL Cholesterol: 56 mg/dL (ref 0–99)
NonHDL: 69.14
Total CHOL/HDL Ratio: 2
Triglycerides: 65 mg/dL (ref 0.0–149.0)
VLDL: 13 mg/dL (ref 0.0–40.0)

## 2020-04-25 LAB — CBC WITH DIFFERENTIAL/PLATELET
Basophils Absolute: 0.1 10*3/uL (ref 0.0–0.1)
Basophils Relative: 1.4 % (ref 0.0–3.0)
Eosinophils Absolute: 0 10*3/uL (ref 0.0–0.7)
Eosinophils Relative: 0.3 % (ref 0.0–5.0)
HCT: 35.3 % — ABNORMAL LOW (ref 36.0–46.0)
Hemoglobin: 11.8 g/dL — ABNORMAL LOW (ref 12.0–15.0)
Lymphocytes Relative: 16.3 % (ref 12.0–46.0)
Lymphs Abs: 1.5 10*3/uL (ref 0.7–4.0)
MCHC: 33.3 g/dL (ref 30.0–36.0)
MCV: 85.9 fl (ref 78.0–100.0)
Monocytes Absolute: 0.8 10*3/uL (ref 0.1–1.0)
Monocytes Relative: 8.2 % (ref 3.0–12.0)
Neutro Abs: 7 10*3/uL (ref 1.4–7.7)
Neutrophils Relative %: 73.8 % (ref 43.0–77.0)
Platelets: 397 10*3/uL (ref 150.0–400.0)
RBC: 4.11 Mil/uL (ref 3.87–5.11)
RDW: 15.5 % (ref 11.5–15.5)
WBC: 9.4 10*3/uL (ref 4.0–10.5)

## 2020-04-25 LAB — HEPATIC FUNCTION PANEL
ALT: 15 U/L (ref 0–35)
AST: 24 U/L (ref 0–37)
Albumin: 4.1 g/dL (ref 3.5–5.2)
Alkaline Phosphatase: 109 U/L (ref 39–117)
Bilirubin, Direct: 0.1 mg/dL (ref 0.0–0.3)
Total Bilirubin: 0.3 mg/dL (ref 0.2–1.2)
Total Protein: 7.4 g/dL (ref 6.0–8.3)

## 2020-04-25 LAB — BASIC METABOLIC PANEL
BUN: 14 mg/dL (ref 6–23)
CO2: 26 mEq/L (ref 19–32)
Calcium: 9.7 mg/dL (ref 8.4–10.5)
Chloride: 93 mEq/L — ABNORMAL LOW (ref 96–112)
Creatinine, Ser: 0.88 mg/dL (ref 0.40–1.20)
GFR: 62.36 mL/min (ref >60.00)
Glucose, Bld: 97 mg/dL (ref 70–99)
Potassium: 4.3 mEq/L (ref 3.5–5.1)
Sodium: 127 mEq/L — ABNORMAL LOW (ref 135–145)

## 2020-04-25 LAB — TSH: TSH: 0.64 u[IU]/mL (ref 0.35–4.50)

## 2020-04-25 LAB — VITAMIN B12: Vitamin B-12: 491 pg/mL (ref 211–911)

## 2020-04-25 LAB — HEMOGLOBIN A1C: Hgb A1c MFr Bld: 5.5 % (ref 4.6–6.5)

## 2020-04-25 MED ORDER — ALPRAZOLAM 0.25 MG PO TABS
ORAL_TABLET | ORAL | 5 refills | Status: DC
Start: 2020-04-25 — End: 2020-10-29

## 2020-04-25 MED ORDER — HYDROCODONE-ACETAMINOPHEN 5-325 MG PO TABS: 1.0000 | ORAL_TABLET | Freq: Two times a day (BID) | ORAL | 0 refills | Status: DC | PRN

## 2020-04-25 MED ORDER — LOSARTAN POTASSIUM 50 MG PO TABS: 50.0000 mg | ORAL_TABLET | Freq: Every day | ORAL | 3 refills | Status: DC

## 2020-04-25 NOTE — Assessment & Plan Note (Addendum)
Indication for chronic opioid: chronic low back pain Medication and dose: hydrocodon 5 325 # pills per month: 100 pills per year Last UDS date: tbd Opioid Treatment Agreement signed (Y/N): yes Opioid Treatment Agreement last reviewed with patient:  today [place] reviewed this encounter (include red flags): Yes  I spent 31 minutes in addition to time for CPX wellness examination in preparing to see the patient by review of recent labs, imaging and procedures, obtaining and reviewing separately obtained history, communicating with the patient and family or caregiver, ordering medications, tests or procedures, and documenting clinical information in the EHR including the differential Dx, treatment, and any further evaluation and other management of pain management, hypokalemia, copd, htn, anxiety, hyperglycemia, hld

## 2020-04-25 NOTE — Progress Notes (Signed)
Subjective:    Patient ID: Tammy Wolfe, female    DOB: 10-14-1940, 79 y.o.   MRN: [id number]  HPI Here for wellness and f/u;  Overall doing ok;  Pt denies Chest pain, worsening SOB, DOE, wheezing, orthopnea, PND, worsening LE edema, palpitations, dizziness or syncope.  Pt denies neurological change such as new headache, facial or extremity weakness.  Pt denies polydipsia, polyuria, or low sugar symptoms. Pt states overall good compliance with treatment and medications, good tolerability, and has been trying to follow appropriate diet.  Pt denies worsening depressive symptoms, suicidal ideation or panic. No fever, night sweats, wt loss, loss of appetite, or other constitutional symptoms.  Pt states good ability with ADL's, has low fall risk, home safety reviewed and adequate, no other significant changes in hearing or vision, and only occasionally active with exercise.  Quit smoking x 2 mo - very difficult.  S/p dental surgury last month, low appetite, no wt gain, taking ensure to keep nutrition. .no new complaints. Needs pain contract/uds renewed for ongoing pain need, only takes very rarely when absolutely needed.  Due for flu shot Wt Readings from Last 3 Encounters:  04/25/20 85 lb (38.6 kg)  04/05/19 85 lb (38.6 kg)  04/05/19 85 lb (38.6 kg)   Past Medical History:  Diagnosis Date  . ABNORMAL THYROID FUNCTION TESTS 12/10/2009  . ALCOHOLIC HEPATITIS, HX OF 02/14/2007  . ALLERGIC RHINITIS 02/14/2007  . Anemia, unspecified 04/26/2012  . ANXIETY 03/19/2007  . Breast cancer ([place])   . Breast implant removal status 12/23/2008  . COLONIC POLYPS, HX OF 02/14/2007  . Coronary artery calcification seen on CT scan 10/31/2013  . GOITER, MULTINODULAR 02/14/2007  . HX, PERSONAL, ALCOHOLISM 02/14/2007  . HYPERLIPIDEMIA 02/14/2007  . HYPERTENSION 02/14/2007  . Insomnia   . OSTEOARTHRITIS 02/14/2007  . OSTEOPOROSIS 02/14/2007  . PANCREATITIS, HX OF 02/14/2007  . Smoker 09/06/2013   Past Surgical History:    Procedure Laterality Date  . BREAST SURGERY  0347   silicone prepectoral implants explanted by Dr. Towanda Malkin after rupture  . BREAST SURGERY     left simple mastectomy and left axillary sentinel node biopsy, right prophylactic mastectomy  . CATARACT EXTRACTION    . peridontal  2010  . s/p jaw and periodontal surgury  2009    reports that she has been smoking cigarettes. She has been smoking about 0.50 packs per day. She has never used smokeless tobacco. She reports previous alcohol use. She reports that she does not use drugs. family history includes Hyperlipidemia in her brother. She was adopted. Allergies  Allergen Reactions  . Tetracycline Anaphylaxis  . Lovastatin Other (See Comments)    Muscle cramps  . Pravastatin Other (See Comments)    Leg cramps   Current Outpatient Medications on File Prior to Visit  Medication Sig Dispense Refill  . Cholecalciferol (VITAMIN D) 1000 UNITS capsule Take 1,000 Units by mouth daily.      Marland Kitchen trimethoprim (TRIMPEX) 100 MG tablet Take 1 tablet (100 mg total) by mouth daily. 90 tablet 3   Current Facility-Administered Medications on File Prior to Visit  Medication Dose Route Frequency Provider Last Rate Last Admin  . lidocaine (PF) (XYLOCAINE) 1 % injection 0.9 mL  0.9 mL Other Once Plotnikov, Evie Lacks, MD       Review of Systems All otherwise neg per pt    Objective:   Physical Exam BP 120/90 (BP Location: Left Arm, Patient Position: Sitting, Cuff Size: Large)   Pulse (!) 114  Temp 98.1 F (36.7 C) (Oral)   Ht 5\' 4"  (1.626 m)   Wt 85 lb (38.6 kg)   SpO2 97%   BMI 14.59 kg/m  VS noted,  Constitutional: Pt appears in NAD HENT: Head: NCAT.  Right Ear: External ear normal.  Left Ear: External ear normal.  Eyes: . Pupils are equal, round, and reactive to light. Conjunctivae and EOM are normal Nose: without d/c or deformity Neck: Neck supple. Gross normal ROM Cardiovascular: Normal rate and regular rhythm.   Pulmonary/Chest: Effort  normal and breath sounds without rales or wheezing.  Abd:  Soft, NT, ND, + BS, no organomegaly Neurological: Pt is alert. At baseline orientation, motor grossly intact Skin: Skin is warm. No rashes, other new lesions, no LE edema Psychiatric: Pt behavior is normal without agitation  All otherwise neg per pt Lab Results  Component Value Date   WBC 9.4 04/25/2020   HGB 11.8 (L) 04/25/2020   HCT 35.3 (L) 04/25/2020   PLT 397.0 04/25/2020   GLUCOSE 97 04/25/2020   CHOL 160 04/25/2020   TRIG 65.0 04/25/2020   HDL 90.60 04/25/2020   LDLCALC 56 04/25/2020   ALT 15 04/25/2020   AST 24 04/25/2020   NA 127 (L) 04/25/2020   K 4.3 04/25/2020   CL 93 (L) 04/25/2020   CREATININE 0.88 04/25/2020   BUN 14 04/25/2020   CO2 26 04/25/2020   TSH 0.64 04/25/2020   HGBA1C 5.5 04/25/2020      Assessment & Plan:

## 2020-04-25 NOTE — Patient Instructions (Signed)
You had the flu shot today  You signed the pain contract today  Please continue all other medications as before, and refills have been done if requested.  Please have the pharmacy call with any other refills you may need.  Please continue your efforts at being more active, low cholesterol diet, and weight control.  You are otherwise up to date with prevention measures today.  Please keep your appointments with your specialists as you may have planned  Please go to the LAB at the blood drawing area for the tests to be done  You will be contacted by phone if any changes need to be made immediately.  Otherwise, you will receive a letter about your results with an explanation, but please check with MyChart first.  Please remember to sign up for MyChart if you have not done so, as this will be important to you in the future with finding out test results, communicating by private email, and scheduling acute appointments online when needed.  Please make an Appointment to return for your 1 year visit, or sooner if needed

## 2020-04-26 LAB — URINALYSIS, ROUTINE W REFLEX MICROSCOPIC
Bilirubin Urine: NEGATIVE
Ketones, ur: NEGATIVE
Leukocytes,Ua: NEGATIVE
Nitrite: NEGATIVE
Specific Gravity, Urine: 1.015 (ref 1.000–1.030)
Total Protein, Urine: NEGATIVE
Urine Glucose: NEGATIVE
Urobilinogen, UA: 0.2 (ref 0.0–1.0)
pH: 7 (ref 5.0–8.0)

## 2020-04-29 ENCOUNTER — Encounter: Payer: Self-pay | Admitting: Internal Medicine

## 2020-04-29 NOTE — Assessment & Plan Note (Signed)
stable overall by history and exam, recent data reviewed with pt, and pt to continue medical treatment as before,  to f/u any worsening symptoms or concerns  

## 2020-04-29 NOTE — Assessment & Plan Note (Signed)
For f/u lab, cont replacement

## 2020-04-29 NOTE — Assessment & Plan Note (Signed)

## 2020-04-29 NOTE — Assessment & Plan Note (Signed)
Lab Results  Component Value Date   LDLCALC 56 04/25/2020  stable overall by history and exam, recent data reviewed with pt, and pt to continue medical treatment as before,  to f/u any worsening symptoms or concerns

## 2020-05-01 DIAGNOSIS — R350 Frequency of micturition: Secondary | ICD-10-CM | POA: Diagnosis not present

## 2020-05-01 DIAGNOSIS — R351 Nocturia: Secondary | ICD-10-CM | POA: Diagnosis not present

## 2020-05-09 DIAGNOSIS — H52223 Regular astigmatism, bilateral: Secondary | ICD-10-CM | POA: Diagnosis not present

## 2020-08-09 ENCOUNTER — Telehealth: Payer: Self-pay | Admitting: Internal Medicine

## 2020-08-09 NOTE — Telephone Encounter (Signed)
LVM for pt to rtn my call to schedule AWV with [facility]. Please schedule appt if pt calls the office.  

## 2020-10-29 ENCOUNTER — Other Ambulatory Visit: Payer: Self-pay | Admitting: Internal Medicine

## 2021-01-30 ENCOUNTER — Telehealth: Payer: Self-pay | Admitting: Internal Medicine

## 2021-01-30 NOTE — Telephone Encounter (Signed)
Left message for patient to call me back at [phone] to schedule Medicare Annual Wellness Visit   Last AWV  04/05/19  Please schedule at anytime with [facility] if patient calls the office back.    40 Minutes appointment   Any questions, please call me at [phone]

## 2021-04-04 DIAGNOSIS — J320 Chronic maxillary sinusitis: Secondary | ICD-10-CM | POA: Diagnosis not present

## 2021-04-22 DIAGNOSIS — J342 Deviated nasal septum: Secondary | ICD-10-CM | POA: Diagnosis not present

## 2021-04-22 DIAGNOSIS — J320 Chronic maxillary sinusitis: Secondary | ICD-10-CM | POA: Diagnosis not present

## 2021-05-01 ENCOUNTER — Other Ambulatory Visit (INDEPENDENT_AMBULATORY_CARE_PROVIDER_SITE_OTHER): Payer: Medicare Other

## 2021-05-01 ENCOUNTER — Ambulatory Visit (INDEPENDENT_AMBULATORY_CARE_PROVIDER_SITE_OTHER): Payer: Medicare Other | Admitting: Internal Medicine

## 2021-05-01 ENCOUNTER — Ambulatory Visit (INDEPENDENT_AMBULATORY_CARE_PROVIDER_SITE_OTHER): Payer: Medicare Other

## 2021-05-01 ENCOUNTER — Other Ambulatory Visit: Payer: Self-pay

## 2021-05-01 ENCOUNTER — Encounter: Payer: Self-pay | Admitting: Internal Medicine

## 2021-05-01 VITALS — BP 140/90 | HR 132 | Temp 98.3°F | Ht 64.0 in | Wt 78.4 lb

## 2021-05-01 DIAGNOSIS — E7800 Pure hypercholesterolemia, unspecified: Secondary | ICD-10-CM

## 2021-05-01 DIAGNOSIS — J329 Chronic sinusitis, unspecified: Secondary | ICD-10-CM

## 2021-05-01 DIAGNOSIS — E559 Vitamin D deficiency, unspecified: Secondary | ICD-10-CM | POA: Diagnosis not present

## 2021-05-01 DIAGNOSIS — R7302 Impaired glucose tolerance (oral): Secondary | ICD-10-CM

## 2021-05-01 DIAGNOSIS — Z23 Encounter for immunization: Secondary | ICD-10-CM

## 2021-05-01 DIAGNOSIS — E538 Deficiency of other specified B group vitamins: Secondary | ICD-10-CM

## 2021-05-01 DIAGNOSIS — R000 Tachycardia, unspecified: Secondary | ICD-10-CM

## 2021-05-01 DIAGNOSIS — I10 Essential (primary) hypertension: Secondary | ICD-10-CM | POA: Diagnosis not present

## 2021-05-01 DIAGNOSIS — Z0000 Encounter for general adult medical examination without abnormal findings: Secondary | ICD-10-CM | POA: Diagnosis not present

## 2021-05-01 DIAGNOSIS — Z0001 Encounter for general adult medical examination with abnormal findings: Secondary | ICD-10-CM | POA: Diagnosis not present

## 2021-05-01 DIAGNOSIS — Z87891 Personal history of nicotine dependence: Secondary | ICD-10-CM

## 2021-05-01 DIAGNOSIS — J449 Chronic obstructive pulmonary disease, unspecified: Secondary | ICD-10-CM

## 2021-05-01 LAB — CBC WITH DIFFERENTIAL/PLATELET
Basophils Absolute: 0 10*3/uL (ref 0.0–0.1)
Basophils Relative: 0.4 % (ref 0.0–3.0)
Eosinophils Absolute: 0.1 10*3/uL (ref 0.0–0.7)
Eosinophils Relative: 1.4 % (ref 0.0–5.0)
HCT: 36.2 % (ref 36.0–46.0)
Hemoglobin: 11.4 g/dL — ABNORMAL LOW (ref 12.0–15.0)
Lymphocytes Relative: 22.3 % (ref 12.0–46.0)
Lymphs Abs: 1.7 10*3/uL (ref 0.7–4.0)
MCHC: 31.5 g/dL (ref 30.0–36.0)
MCV: 90.4 fl (ref 78.0–100.0)
Monocytes Absolute: 0.4 10*3/uL (ref 0.1–1.0)
Monocytes Relative: 5.2 % (ref 3.0–12.0)
Neutro Abs: 5.5 10*3/uL (ref 1.4–7.7)
Neutrophils Relative %: 70.7 % (ref 43.0–77.0)
Platelets: 394 10*3/uL (ref 150.0–400.0)
RBC: 4.01 Mil/uL (ref 3.87–5.11)
RDW: 14.5 % (ref 11.5–15.5)
WBC: 7.8 10*3/uL (ref 4.0–10.5)

## 2021-05-01 LAB — BASIC METABOLIC PANEL
BUN: 29 mg/dL — ABNORMAL HIGH (ref 6–23)
CO2: 26 mEq/L (ref 19–32)
Calcium: 9.8 mg/dL (ref 8.4–10.5)
Chloride: 106 mEq/L (ref 96–112)
Creatinine, Ser: 0.91 mg/dL (ref 0.40–1.20)
GFR: 59.48 mL/min — ABNORMAL LOW (ref >60.00)
Glucose, Bld: 96 mg/dL (ref 70–99)
Potassium: 4.8 mEq/L (ref 3.5–5.1)
Sodium: 139 mEq/L (ref 135–145)

## 2021-05-01 LAB — URINALYSIS, ROUTINE W REFLEX MICROSCOPIC
Bilirubin Urine: NEGATIVE
Ketones, ur: NEGATIVE
Nitrite: NEGATIVE
Specific Gravity, Urine: 1.01 (ref 1.000–1.030)
Total Protein, Urine: 30 — AB
Urine Glucose: NEGATIVE
Urobilinogen, UA: 0.2 (ref 0.0–1.0)
pH: 6.5 (ref 5.0–8.0)

## 2021-05-01 LAB — VITAMIN D 25 HYDROXY (VIT D DEFICIENCY, FRACTURES): VITD: 65.15 ng/mL (ref 30.00–100.00)

## 2021-05-01 LAB — HEPATIC FUNCTION PANEL
ALT: 11 U/L (ref 0–35)
AST: 19 U/L (ref 0–37)
Albumin: 4 g/dL (ref 3.5–5.2)
Alkaline Phosphatase: 90 U/L (ref 39–117)
Bilirubin, Direct: 0 mg/dL (ref 0.0–0.3)
Total Bilirubin: 0.3 mg/dL (ref 0.2–1.2)
Total Protein: 7.5 g/dL (ref 6.0–8.3)

## 2021-05-01 LAB — HEMOGLOBIN A1C: Hgb A1c MFr Bld: 5.5 % (ref 4.6–6.5)

## 2021-05-01 LAB — LIPID PANEL
Cholesterol: 169 mg/dL (ref 0–200)
HDL: 47.8 mg/dL (ref >39.00)
LDL Cholesterol: 93 mg/dL (ref 0–99)
NonHDL: 121.58
Total CHOL/HDL Ratio: 4
Triglycerides: 145 mg/dL (ref 0.0–149.0)
VLDL: 29 mg/dL (ref 0.0–40.0)

## 2021-05-01 LAB — TSH: TSH: 0.86 u[IU]/mL (ref 0.35–5.50)

## 2021-05-01 LAB — VITAMIN B12: Vitamin B-12: 399 pg/mL (ref 211–911)

## 2021-05-01 MED ORDER — ALPRAZOLAM 0.25 MG PO TABS: ORAL_TABLET | ORAL | 5 refills | Status: DC

## 2021-05-01 MED ORDER — LOSARTAN POTASSIUM 50 MG PO TABS: 50.0000 mg | ORAL_TABLET | Freq: Every day | ORAL | 3 refills | Status: DC

## 2021-05-01 MED ORDER — HYDROCODONE-ACETAMINOPHEN 5-325 MG PO TABS: 1.0000 | ORAL_TABLET | Freq: Two times a day (BID) | ORAL | 0 refills | Status: DC | PRN

## 2021-05-01 NOTE — Progress Notes (Signed)
Patient ID: Tammy Wolfe, female   DOB: Jun 12, 1940, 80 y.o.   MRN: [id number]         Chief Complaint:: wellness exam and tachycardia, copd, hld, htn, hyperglyemia       HPI:  Tammy Wolfe is a 80 y.o. female here for wellness exam; quit smoking x 3 months; declines shingrix, tdap, covid booster, o/w up to date                        Also recent URI/sinus infection, seeing ENT, now on antibiotic, BP has been elevated there x 2, but not sure about the pulse.  Pt denies chest pain, increased sob or doe, wheezing, orthopnea, PND, increased LE swelling, palpitations, dizziness or syncope.   Pt denies polydipsia, polyuria, or new focal neuro s/s.   Pt denies fever, wt loss, night sweats, loss of appetite, or other constitutional symptoms       Wt Readings from Last 3 Encounters:  05/01/21 78 lb 6.4 oz (35.6 kg)  05/01/21 78 lb 6.4 oz (35.6 kg)  04/25/20 85 lb (38.6 kg)   BP Readings from Last 3 Encounters:  05/01/21 140/90  05/01/21 140/90  04/25/20 120/90   Immunization History  Administered Date(s) Administered   Fluad Quad(high Dose 65+) 04/05/2019, 04/25/2020, 05/01/2021   Influenza Whole 03/19/2007   Influenza, High Dose Seasonal PF 03/08/2013, 03/07/2014, 03/14/2015, 03/26/2017, 03/30/2018   Influenza,inj,Quad PF,6+ Mos 03/12/2016   PFIZER(Purple Top)SARS-COV-2 Vaccination 07/24/2019, 08/17/2019, 03/19/2020   Pneumococcal Conjugate-13 03/29/2013   Pneumococcal Polysaccharide-23 01/21/2006   Td 11/12/2009   Zoster, Live 01/21/2006   There are no preventive care reminders to display for this patient.     Past Medical History:  Diagnosis Date   ABNORMAL THYROID FUNCTION TESTS 3/82/5053   ALCOHOLIC HEPATITIS, HX OF 02/14/2007   ALLERGIC RHINITIS 02/14/2007   Anemia, unspecified 04/26/2012   ANXIETY 03/19/2007   Breast cancer ([place])    Breast implant removal status 12/23/2008   COLONIC POLYPS, HX OF 02/14/2007   Coronary artery calcification seen on CT scan 10/31/2013   GOITER,  MULTINODULAR 02/14/2007   HX, PERSONAL, ALCOHOLISM 02/14/2007   HYPERLIPIDEMIA 02/14/2007   HYPERTENSION 02/14/2007   Insomnia    OSTEOARTHRITIS 02/14/2007   OSTEOPOROSIS 02/14/2007   PANCREATITIS, HX OF 02/14/2007   Smoker 09/06/2013   Past Surgical History:  Procedure Laterality Date   BREAST SURGERY  9767   silicone prepectoral implants explanted by Dr. Towanda Malkin after rupture   BREAST SURGERY     left simple mastectomy and left axillary sentinel node biopsy, right prophylactic mastectomy   CATARACT EXTRACTION     peridontal  2010   s/p jaw and periodontal surgury  2009    reports that she has been smoking cigarettes. She has been smoking an average of .5 packs per day. She has never used smokeless tobacco. She reports that she does not currently use alcohol. She reports that she does not use drugs. family history includes Hyperlipidemia in her brother. She was adopted. Allergies  Allergen Reactions   Tetracycline Anaphylaxis   Lovastatin Other (See Comments)    Muscle cramps   Pravastatin Other (See Comments)    Leg cramps   Current Outpatient Medications on File Prior to Visit  Medication Sig Dispense Refill   Cholecalciferol (VITAMIN D) 1000 UNITS capsule Take 1,000 Units by mouth daily.       trimethoprim (TRIMPEX) 100 MG tablet Take 1 tablet (100 mg total) by mouth daily. 90 tablet 3  Current Facility-Administered Medications on File Prior to Visit  Medication Dose Route Frequency Provider Last Rate Last Admin   lidocaine (PF) (XYLOCAINE) 1 % injection 0.9 mL  0.9 mL Other Once Plotnikov, Evie Lacks, MD            ROS:  All others reviewed and negative.  Objective        PE:  BP 140/90   Pulse (!) 132   Temp 98.3 F (36.8 C) (Oral)   Ht 5\' 4"  (1.626 m)   Wt 78 lb 6.4 oz (35.6 kg)   SpO2 98%   BMI 13.46 kg/m                 Constitutional: Pt appears in NAD               HENT: Head: NCAT.                Right Ear: External ear normal.                 Left Ear:  External ear normal.                Eyes: . Pupils are equal, round, and reactive to light. Conjunctivae and EOM are normal               Nose: without d/c or deformity               Neck: Neck supple. Gross normal ROM               Cardiovascular: mild tachy rate and regular rhythm.                 Pulmonary/Chest: Effort normal and breath sounds without rales or wheezing.                Abd:  Soft, NT, ND, + BS, no organomegaly               Neurological: Pt is alert. At baseline orientation, motor grossly intact               Skin: Skin is warm. No rashes, no other new lesions, LE edema - none               Psychiatric: Pt behavior is normal without agitation   Micro: none  Cardiac tracings I have personally interpreted today:  ECG - sinus tachycardia 109  Pertinent Radiological findings (summarize): none   Lab Results  Component Value Date   WBC 7.8 05/01/2021   HGB 11.4 (L) 05/01/2021   HCT 36.2 05/01/2021   PLT 394.0 05/01/2021   GLUCOSE 96 05/01/2021   CHOL 169 05/01/2021   TRIG 145.0 05/01/2021   HDL 47.80 05/01/2021   LDLCALC 93 05/01/2021   ALT 11 05/01/2021   AST 19 05/01/2021   NA 139 05/01/2021   K 4.8 05/01/2021   CL 106 05/01/2021   CREATININE 0.91 05/01/2021   BUN 29 (H) 05/01/2021   CO2 26 05/01/2021   TSH 0.86 05/01/2021   HGBA1C 5.5 05/01/2021   Assessment/Plan:  Tammy Wolfe is a 80 y.o. White or Caucasian [1] female with  has a past medical history of ABNORMAL THYROID FUNCTION TESTS (7/85/8850), ALCOHOLIC HEPATITIS, HX OF (02/14/2007), ALLERGIC RHINITIS (02/14/2007), Anemia, unspecified (04/26/2012), ANXIETY (03/19/2007), Breast cancer ([place]), Breast implant removal status (12/23/2008), COLONIC POLYPS, HX OF (02/14/2007), Coronary artery calcification seen on CT scan (10/31/2013), GOITER, MULTINODULAR (02/14/2007), HX, PERSONAL, ALCOHOLISM (02/14/2007), HYPERLIPIDEMIA (02/14/2007), HYPERTENSION (  02/14/2007), Insomnia, OSTEOARTHRITIS (02/14/2007), OSTEOPOROSIS  (02/14/2007), PANCREATITIS, HX OF (02/14/2007), and Smoker (09/06/2013).  Hyperlipidemia Goal ldl < 70  Lab Results  Component Value Date   LDLCALC 56 04/25/2020   Stable, cont current med tx, for f/u lipids  Encounter for well adult exam with abnormal findings Age and sex appropriate education and counseling updated with regular exercise and diet Referrals for preventative services - none needed Immunizations addressed - declines shingrix, covid booster,  tdap Smoking counseling  - none needed - quit x 3 mo Evidence for depression or other mood disorder - chronic anxiety  - no change, declines change in tx Most recent labs reviewed I have personally reviewed and have noted: 1) the patient's medical and social history 2) The patient's current medications and supplements 3) The patient's height, weight, and BMI have been recorded in the chart   COPD (chronic obstructive pulmonary disease) with chronic bronchitis Stable overall, cont current med tx- albuterol hfa prn,   Essential hypertension BP Readings from Last 3 Encounters:  05/01/21 140/90  05/01/21 140/90  04/25/20 120/90   Borderline uncontrolled today, pt to continue medical treatment as declines increased losartan for now   Impaired glucose tolerance Lab Results  Component Value Date   HGBA1C 5.5 05/01/2021   Stable, pt to continue current medical treatment  - diet   Former smoker Encourage to continue to abstain  Sinusitis Improved, to finish antibx per ENT  Tachycardia Mild sinus tach likely due to anxiety and/or infection stress,  to f/u any worsening symptoms or concerns  Followup: No follow-ups on file.  Cathlean Cower, MD 05/05/2021 7:52 PM [facility]

## 2021-05-01 NOTE — Progress Notes (Signed)
Subjective:   Tammy Wolfe is a 80 y.o. female who presents for Medicare Annual (Subsequent) preventive examination.  Review of Systems     Cardiac Risk Factors include: advanced age (>74men, >73 women);dyslipidemia;hypertension;smoking/ tobacco exposure     Objective:    Today's Vitals   05/01/21 0918  BP: 140/90  Pulse: (!) 132  Temp: 98.3 F (36.8 C)  SpO2: 98%  Weight: 78 lb 6.4 oz (35.6 kg)  Height: 5\' 4"  (1.626 m)  PainSc: 0-No pain   Body mass index is 13.46 kg/m.  Advanced Directives 05/01/2021 04/05/2019 03/30/2018  Does Patient Have a Medical Advance Directive? Yes Yes Yes  Type of Advance Directive Living will;Healthcare Power of [place];Living will [place];Living will  Does patient want to make changes to medical advance directive? No - Patient declined - -  Copy of [place] in Chart? No - copy requested No - copy requested No - copy requested    Current Medications (verified) Outpatient Encounter Medications as of 05/01/2021  Medication Sig   ALPRAZolam (XANAX) 0.25 MG tablet TAKE ONE TABLET THREE TIMES DAILY AS NEEDED   Cholecalciferol (VITAMIN D) 1000 UNITS capsule Take 1,000 Units by mouth daily.     clindamycin (CLEOCIN) 150 MG capsule Take 150 mg by mouth 3 (three) times daily.   HYDROcodone-acetaminophen (NORCO/VICODIN) 5-325 MG tablet Take 1 tablet by mouth 2 (two) times daily as needed.   losartan (COZAAR) 50 MG tablet Take 1 tablet (50 mg total) by mouth daily.   trimethoprim (TRIMPEX) 100 MG tablet Take 1 tablet (100 mg total) by mouth daily.   Facility-Administered Encounter Medications as of 05/01/2021  Medication   lidocaine (PF) (XYLOCAINE) 1 % injection 0.9 mL    Allergies (verified) Tetracycline, Lovastatin, and Pravastatin   History: Past Medical History:  Diagnosis Date   ABNORMAL THYROID FUNCTION TESTS 1/47/8295   ALCOHOLIC HEPATITIS, HX OF 02/14/2007   ALLERGIC  RHINITIS 02/14/2007   Anemia, unspecified 04/26/2012   ANXIETY 03/19/2007   Breast cancer ([place])    Breast implant removal status 12/23/2008   COLONIC POLYPS, HX OF 02/14/2007   Coronary artery calcification seen on CT scan 10/31/2013   GOITER, MULTINODULAR 02/14/2007   HX, PERSONAL, ALCOHOLISM 02/14/2007   HYPERLIPIDEMIA 02/14/2007   HYPERTENSION 02/14/2007   Insomnia    OSTEOARTHRITIS 02/14/2007   OSTEOPOROSIS 02/14/2007   PANCREATITIS, HX OF 02/14/2007   Smoker 09/06/2013   Past Surgical History:  Procedure Laterality Date   BREAST SURGERY  6213   silicone prepectoral implants explanted by Dr. Towanda Malkin after rupture   BREAST SURGERY     left simple mastectomy and left axillary sentinel node biopsy, right prophylactic mastectomy   CATARACT EXTRACTION     peridontal  2010   s/p jaw and periodontal surgury  2009   Family History  Adopted: Yes  Problem Relation Age of Onset   Hyperlipidemia Brother    Social History   Socioeconomic History   Marital status: Divorced    Spouse name: Not on file   Number of children: 3   Years of education: Not on file   Highest education level: Not on file  Occupational History   Occupation: retired Psychologist, sport and exercise  Tobacco Use   Smoking status: Every Day    Packs/day: 0.50    Types: Cigarettes   Smokeless tobacco: Never   Tobacco comments:    smoking about 4 cigs/day (as of 04/10/16)-gwd  Vaping Use   Vaping Use: Never  used  Substance and Sexual Activity   Alcohol use: Not Currently    Alcohol/week: 0.0 standard drinks    Comment: one glass/wine occasional/ no drinking now   Drug use: No   Sexual activity: Not Currently  Other Topics Concern   Not on file  Social History Narrative   Not on file   Social Determinants of Health   Financial Resource Strain: Low Risk    Difficulty of Paying Living Expenses: Not hard at all  Food Insecurity: No Food Insecurity   Worried About Charity fundraiser in the Last Year: Never true   [place] in  the Last Year: Never true  Transportation Needs: No Transportation Needs   Lack of Transportation (Medical): No   Lack of Transportation (Non-Medical): No  Physical Activity: Not on file  Stress: No Stress Concern Present   Feeling of Stress : Not at all  Social Connections: Moderately Integrated   Frequency of Communication with Friends and Family: More than three times a week   Frequency of Social Gatherings with Friends and Family: More than three times a week   Attends Religious Services: 1 to 4 times per year   Active Member of [employer] or Organizations: Yes   Attends Archivist Meetings: 1 to 4 times per year   Marital Status: Divorced    Tobacco Counseling Ready to quit: Not Answered Counseling given: Not Answered Tobacco comments: smoking about 4 cigs/day (as of 04/10/16)-gwd   Clinical Intake:  Pre-visit preparation completed: Yes  Pain : No/denies pain Pain Score: 0-No pain     BMI - recorded: 13.46 Nutritional Status: BMI <19  Underweight Nutritional Risks: None Diabetes: No  How often do you need to have someone help you when you read instructions, pamphlets, or other written materials from your doctor or pharmacy?: 1 - Never What is the last grade level you completed in school?: 3 years of college education  Diabetic? no  Interpreter Needed?: No  Information entered by :: Lisette Abu, LPN   Activities of Daily Living In your present state of health, do you have any difficulty performing the following activities: 05/01/2021  Hearing? N  Vision? N  Difficulty concentrating or making decisions? N  Walking or climbing stairs? N  Dressing or bathing? N  Doing errands, shopping? N  Preparing Food and eating ? N  Using the Toilet? N  In the past six months, have you accidently leaked urine? N  Do you have problems with loss of bowel control? N  Managing your Medications? N  Managing your Finances? N  Housekeeping or managing your  Housekeeping? N  Some recent data might be hidden    Patient Care Team: Biagio Borg, MD as PCP - General Bjorn Loser, MD as Consulting Physician (Urology) Luberta Mutter, MD as Consulting Physician (Ophthalmology) Magrinat, Virgie Dad, MD as Consulting Physician (Oncology)  Indicate any recent Medical Services you may have received from other than [facility] providers in the past year (date may be approximate).     Assessment:   This is a routine wellness examination for [place]is.  Hearing/Vision screen Hearing Screening - Comments:: Patient denied any hearing difficulty.   No hearing aids.  Vision Screening - Comments:: Patient wears corrective glasses/contacts.  Eye exam done annually by: Luberta Mutter, MD.  Dietary issues and exercise activities discussed: Current Exercise Habits: Home exercise routine, Type of exercise: walking, Time (Minutes): 30, Frequency (Times/Week): 5, Weekly Exercise (Minutes/Week): 150, Intensity: Mild, Exercise limited  by: respiratory conditions(s)   Goals Addressed   None   Depression Screen PHQ 2/9 Scores 05/01/2021 04/25/2020 04/25/2020 04/05/2019 03/30/2018 03/30/2018 03/26/2017  PHQ - 2 Score 0 0 0 1 0 0 0  PHQ- 9 Score - - - 2 - - -    Fall Risk Fall Risk  05/01/2021 04/25/2020 04/25/2020 04/05/2019 03/30/2018  Falls in the past year? 0 0 0 1 0  Number falls in past yr: 0 - 0 0 -  Comment - - - fell up the stairs with 2 black eyes and sore still all over -  Injury with Fall? 0 - 0 0 -  Risk for fall due to : No Fall Risks - No Fall Risks - -  Follow up Falls prevention discussed - Falls evaluation completed - Falls prevention discussed    FALL RISK PREVENTION PERTAINING TO THE HOME:  Any stairs in or around the home? Yes  If so, are there any without handrails? No  Home free of loose throw rugs in walkways, pet beds, electrical cords, etc? Yes  Adequate lighting in your home to reduce risk of falls? Yes   ASSISTIVE DEVICES UTILIZED TO  PREVENT FALLS:  Life alert? No  Use of a cane, walker or w/c? No  Grab bars in the bathroom? No  Shower chair or bench in shower? Yes  Elevated toilet seat or a handicapped toilet? No   TIMED UP AND GO:  Was the test performed? Yes .  Length of time to ambulate 10 feet: 8 sec.   Gait steady and fast without use of assistive device  Cognitive Function: Normal cognitive status assessed by direct observation by this Nurse Health Advisor. No abnormalities found.   MMSE - Mini Mental State Exam 03/30/2018  Orientation to time 5  Orientation to Place 5  Registration 3  Attention/ Calculation 5  Recall 2  Language- name 2 objects 2  Language- repeat 1  Language- follow 3 step command 3  Language- read & follow direction 1  Write a sentence 1  Copy design 1  Total score 29        Immunizations Immunization History  Administered Date(s) Administered   Fluad Quad(high Dose 65+) 04/05/2019, 04/25/2020   Influenza Whole 03/19/2007   Influenza, High Dose Seasonal PF 03/08/2013, 03/07/2014, 03/14/2015, 03/26/2017, 03/30/2018   Influenza,inj,Quad PF,6+ Mos 03/12/2016   PFIZER(Purple Top)SARS-COV-2 Vaccination 07/24/2019, 08/17/2019, 03/19/2020   Pneumococcal Conjugate-13 03/29/2013   Pneumococcal Polysaccharide-23 01/21/2006   Td 11/12/2009   Zoster, Live 01/21/2006    TDAP status: Due, Education has been provided regarding the importance of this vaccine. Advised may receive this vaccine at local pharmacy or Health Dept. Aware to provide a copy of the vaccination record if obtained from local pharmacy or Health Dept. Verbalized acceptance and understanding.  Flu Vaccine status: Due, Education has been provided regarding the importance of this vaccine. Advised may receive this vaccine at local pharmacy or Health Dept. Aware to provide a copy of the vaccination record if obtained from local pharmacy or Health Dept. Verbalized acceptance and understanding.  Pneumococcal vaccine  status: Due, Education has been provided regarding the importance of this vaccine. Advised may receive this vaccine at local pharmacy or Health Dept. Aware to provide a copy of the vaccination record if obtained from local pharmacy or Health Dept. Verbalized acceptance and understanding.  Covid-19 vaccine status: Completed vaccines  Qualifies for Shingles Vaccine? Yes   Zostavax completed Yes   Shingrix Completed?: No.    Education  has been provided regarding the importance of this vaccine. Patient has been advised to call insurance company to determine out of pocket expense if they have not yet received this vaccine. Advised may also receive vaccine at local pharmacy or Health Dept. Verbalized acceptance and understanding.  Screening Tests Health Maintenance  Topic Date Due   Zoster Vaccines- Shingrix (1 of 2) Never done   TETANUS/TDAP  11/13/2019   COVID-19 Vaccine (4 - Booster for Pfizer series) 05/14/2020   INFLUENZA VACCINE  12/24/2020   Pneumonia Vaccine 68+ Years old  Completed   DEXA SCAN  Completed   HPV VACCINES  Aged Out    Health Maintenance  Health Maintenance Due  Topic Date Due   Zoster Vaccines- Shingrix (1 of 2) Never done   TETANUS/TDAP  11/13/2019   COVID-19 Vaccine (4 - Booster for Pfizer series) 05/14/2020   INFLUENZA VACCINE  12/24/2020    Colorectal cancer screening: No longer required.   Mammogram status: No longer required due to double masectomy.  Bone Density status: Completed 03/04/2011. Results reflect: Bone density results: OSTEOPOROSIS. Repeat every 2-3 years.  Lung Cancer Screening: (Low Dose CT Chest recommended if Age 25-80 years, 30 pack-year currently smoking OR have quit w/in 15years.) does not qualify.   Lung Cancer Screening Referral: no  Additional Screening:  Hepatitis C Screening: does not qualify; Completed no  Vision Screening: Recommended annual ophthalmology exams for early detection of glaucoma and other disorders of the  eye. Is the patient up to date with their annual eye exam?  Yes  Who is the provider or what is the name of the office in which the patient attends annual eye exams? Luberta Mutter, MD. If pt is not established with a provider, would they like to be referred to a provider to establish care? No .   Dental Screening: Recommended annual dental exams for proper oral hygiene  Community Resource Referral / Chronic Care Management: CRR required this visit?  No   CCM required this visit?  No      Plan:     I have personally reviewed and noted the following in the patient's chart:   Medical and social history Use of alcohol, tobacco or illicit drugs  Current medications and supplements including opioid prescriptions.  Functional ability and status Nutritional status Physical activity Advanced directives List of other physicians Hospitalizations, surgeries, and ER visits in previous 12 months Vitals Screenings to include cognitive, depression, and falls Referrals and appointments  In addition, I have reviewed and discussed with patient certain preventive protocols, quality metrics, and best practice recommendations. A written personalized care plan for preventive services as well as general preventive health recommendations were provided to patient.     Sheral Flow, LPN   62/01/5283   Nurse Notes:  Hearing Screening - Comments:: Patient denied any hearing difficulty.   No hearing aids.  Vision Screening - Comments:: Patient wears corrective glasses/contacts.  Eye exam done annually by: Luberta Mutter, MD.

## 2021-05-01 NOTE — Assessment & Plan Note (Signed)
Goal ldl < 70  Lab Results  Component Value Date   LDLCALC 56 04/25/2020   Stable, cont current med tx, for f/u lipids

## 2021-05-01 NOTE — Patient Instructions (Addendum)
You had the flu shot today  Your EKG was done today  Please continue all other medications as before, and refills have been done if requested.  Please have the pharmacy call with any other refills you may need.  Please continue your efforts at being more active, low cholesterol diet, and weight control.  You are otherwise up to date with prevention measures today.  Please keep your appointments with your specialists as you may have planned  Please go to the LAB at the blood drawing area for the tests to be done  You will be contacted by phone if any changes need to be made immediately.  Otherwise, you will receive a letter about your results with an explanation, but please check with MyChart first.  Please remember to sign up for MyChart if you have not done so, as this will be important to you in the future with finding out test results, communicating by private email, and scheduling acute appointments online when needed.  Please make an Appointment to return in 6 months, or sooner if needed  

## 2021-05-02 ENCOUNTER — Encounter: Payer: Self-pay | Admitting: Internal Medicine

## 2021-05-05 NOTE — Assessment & Plan Note (Signed)
Lab Results  Component Value Date   HGBA1C 5.5 05/01/2021   Stable, pt to continue current medical treatment  - diet

## 2021-05-05 NOTE — Assessment & Plan Note (Addendum)
BP Readings from Last 3 Encounters:  05/01/21 140/90  05/01/21 140/90  04/25/20 120/90   Borderline uncontrolled today, pt to continue medical treatment as declines increased losartan for now

## 2021-05-05 NOTE — Assessment & Plan Note (Signed)
Improved, to finish antibx per ENT

## 2021-05-05 NOTE — Assessment & Plan Note (Signed)
Stable overall, cont current med tx- albuterol hfa prn,

## 2021-05-05 NOTE — Assessment & Plan Note (Signed)
Age and sex appropriate education and counseling updated with regular exercise and diet Referrals for preventative services - none needed Immunizations addressed - declines shingrix, covid booster,  tdap Smoking counseling  - none needed - quit x 3 mo Evidence for depression or other mood disorder - chronic anxiety  - no change, declines change in tx Most recent labs reviewed I have personally reviewed and have noted: 1) the patient's medical and social history 2) The patient's current medications and supplements 3) The patient's height, weight, and BMI have been recorded in the chart

## 2021-05-05 NOTE — Assessment & Plan Note (Signed)
Encourage to continue to abstain

## 2021-05-05 NOTE — Assessment & Plan Note (Signed)
Mild sinus tach likely due to anxiety and/or infection stress,  to f/u any worsening symptoms or concerns

## 2021-05-14 DIAGNOSIS — Z87891 Personal history of nicotine dependence: Secondary | ICD-10-CM | POA: Diagnosis not present

## 2021-05-14 DIAGNOSIS — J320 Chronic maxillary sinusitis: Secondary | ICD-10-CM | POA: Diagnosis not present

## 2021-05-14 DIAGNOSIS — J449 Chronic obstructive pulmonary disease, unspecified: Secondary | ICD-10-CM | POA: Insufficient documentation

## 2021-05-22 DIAGNOSIS — H52203 Unspecified astigmatism, bilateral: Secondary | ICD-10-CM | POA: Diagnosis not present

## 2021-06-05 DIAGNOSIS — R350 Frequency of micturition: Secondary | ICD-10-CM | POA: Diagnosis not present

## 2021-06-11 NOTE — Pre-Procedure Instructions (Signed)
Surgical Instructions    Your procedure is scheduled on Friday 06/14/21.   Report to [facility] Main Entrance "A" at 07:25 A.M., then check in with the Admitting office.  Call this number if you have problems the morning of surgery:  [phone]   If you have any questions prior to your surgery date call [phone]: Open Monday-Friday 8am-4pm    Remember:  Do not eat after midnight the night before your surgery  You may drink clear liquids until 06:25 A.M. the morning of your surgery.   Clear liquids allowed are: Water, Non-Citrus Juices (without pulp), Carbonated Beverages, Clear Tea, Black Coffee ONLY (NO MILK, CREAM OR POWDERED CREAMER of any kind), and Gatorade    Take these medicines the morning of surgery with A SIP OF WATER   acetaminophen (TYLENOL)- If needed  ALPRAZolam Duanne Moron)- If needed  HYDROcodone-acetaminophen (NORCO/VICODIN)- If needed  oxymetazoline (AFRIN)- If needed   As of today, STOP taking any Aspirin (unless otherwise instructed by your surgeon) Aleve, Naproxen, Ibuprofen, Motrin, Advil, Goody's, BC's, all herbal medications, fish oil, and all vitamins.     After your COVID test   You are not required to quarantine however you are required to wear a well-fitting mask when you are out and around people not in your household.  If your mask becomes wet or soiled, replace with a new one.  Wash your hands often with soap and water for 20 seconds or clean your hands with an alcohol-based hand sanitizer that contains at least 60% alcohol.  Do not share personal items.  Notify your provider: if you are in close contact with someone who has COVID  or if you develop a fever of 100.4 or greater, sneezing, cough, sore throat, shortness of breath or body aches.             Do not wear jewelry or makeup Do not wear lotions, powders, perfumes/colognes, or deodorant. Do not shave 48 hours prior to surgery.  Men may shave face and neck. Do not bring valuables to  the hospital. DO Not wear nail polish, gel polish, artificial nails, or any other type of covering on natural nails including finger and toenails. If patients have artificial nails, gel coating, etc. that need to be removed by a nail salon, please have this removed prior to surgery or surgery may need to be canceled/delayed if the surgeon/ anesthesia feels like the patient is unable to be adequately monitored.             [facility] is not responsible for any belongings or valuables.  Do NOT Smoke (Tobacco/Vaping)  24 hours prior to your procedure  If you use a CPAP at night, you may bring your mask for your overnight stay.   Contacts, glasses, hearing aids, dentures or partials may not be worn into surgery, please bring cases for these belongings   For patients admitted to the hospital, discharge time will be determined by your treatment team.   Patients discharged the day of surgery will not be allowed to drive home, and someone needs to stay with them for 24 hours.  NO VISITORS WILL BE ALLOWED IN PRE-OP WHERE PATIENTS ARE PREPPED FOR SURGERY.  ONLY 1 SUPPORT PERSON MAY BE PRESENT IN THE WAITING ROOM WHILE YOU ARE IN SURGERY.  IF YOU ARE TO BE ADMITTED, ONCE YOU ARE IN YOUR ROOM YOU WILL BE ALLOWED TWO (2) VISITORS. 1 (ONE) VISITOR MAY STAY OVERNIGHT BUT MUST ARRIVE TO THE ROOM BY 8pm.  Minor children may have two parents present. Special consideration for safety and communication needs will be reviewed on a case by case basis.  Special instructions:    Oral Hygiene is also important to reduce your risk of infection.  Remember - BRUSH YOUR TEETH THE MORNING OF SURGERY WITH YOUR REGULAR TOOTHPASTE   [facility]- Preparing For Surgery  Before surgery, you can play an important role. Because skin is not sterile, your skin needs to be as free of germs as possible. You can reduce the number of germs on your skin by washing with CHG (chlorahexidine gluconate) Soap before surgery.  CHG is an  antiseptic cleaner which kills germs and bonds with the skin to continue killing germs even after washing.     Please do not use if you have an allergy to CHG or antibacterial soaps. If your skin becomes reddened/irritated stop using the CHG.  Do not shave (including legs and underarms) for at least 48 hours prior to first CHG shower. It is OK to shave your face.  Please follow these instructions carefully.     Shower the NIGHT BEFORE SURGERY and the MORNING OF SURGERY with CHG Soap.   If you chose to wash your hair, wash your hair first as usual with your normal shampoo. After you shampoo, rinse your hair and body thoroughly to remove the shampoo.  Then [employer] and genitals (private parts) with your normal soap and rinse thoroughly to remove soap.  After that Use CHG Soap as you would any other liquid soap. You can apply CHG directly to the skin and wash gently with a scrungie or a clean washcloth.   Apply the CHG Soap to your body ONLY FROM THE NECK DOWN.  Do not use on open wounds or open sores. Avoid contact with your eyes, ears, mouth and genitals (private parts). Wash Face and genitals (private parts)  with your normal soap.   Wash thoroughly, paying special attention to the area where your surgery will be performed.  Thoroughly rinse your body with warm water from the neck down.  DO NOT shower/wash with your normal soap after using and rinsing off the CHG Soap.  Pat yourself dry with a CLEAN TOWEL.  Wear CLEAN PAJAMAS to bed the night before surgery  Place CLEAN SHEETS on your bed the night before your surgery  DO NOT SLEEP WITH PETS.   Day of Surgery:  Take a shower with CHG soap. Wear Clean/Comfortable clothing the morning of surgery Do not apply any deodorants/lotions.   Remember to brush your teeth WITH YOUR REGULAR TOOTHPASTE.   Please read over the following fact sheets that you were given.

## 2021-06-12 ENCOUNTER — Other Ambulatory Visit: Payer: Self-pay

## 2021-06-12 ENCOUNTER — Encounter (HOSPITAL_COMMUNITY): Payer: Self-pay

## 2021-06-12 ENCOUNTER — Encounter (HOSPITAL_COMMUNITY)
Admission: RE | Admit: 2021-06-12 | Discharge: 2021-06-12 | Disposition: A | Discharge status: Home / Self Care | Payer: Medicare Other | Source: Ambulatory Visit | Attending: Otolaryngology | Admitting: Otolaryngology

## 2021-06-12 VITALS — BP 129/83 | HR 135 | Temp 97.7°F | Resp 20 | Ht 59.0 in | Wt 78.1 lb

## 2021-06-12 DIAGNOSIS — Z01812 Encounter for preprocedural laboratory examination: Secondary | ICD-10-CM | POA: Diagnosis not present

## 2021-06-12 DIAGNOSIS — I10 Essential (primary) hypertension: Secondary | ICD-10-CM | POA: Diagnosis not present

## 2021-06-12 DIAGNOSIS — Z01818 Encounter for other preprocedural examination: Secondary | ICD-10-CM

## 2021-06-12 DIAGNOSIS — Z20822 Contact with and (suspected) exposure to covid-19: Secondary | ICD-10-CM | POA: Diagnosis not present

## 2021-06-12 HISTORY — DX: Chronic obstructive pulmonary disease, unspecified: J44.9

## 2021-06-12 LAB — COMPREHENSIVE METABOLIC PANEL
ALT: 16 U/L (ref 0–44)
AST: 20 U/L (ref 15–41)
Albumin: 3.4 g/dL — ABNORMAL LOW (ref 3.5–5.0)
Alkaline Phosphatase: 87 U/L (ref 38–126)
Anion gap: 7 (ref 5–15)
BUN: 27 mg/dL — ABNORMAL HIGH (ref 8–23)
CO2: 24 mmol/L (ref 22–32)
Calcium: 9.3 mg/dL (ref 8.9–10.3)
Chloride: 110 mmol/L (ref 98–111)
Creatinine, Ser: 1.07 mg/dL — ABNORMAL HIGH (ref 0.44–1.00)
GFR, Estimated: 53 mL/min — ABNORMAL LOW (ref >60)
Glucose, Bld: 129 mg/dL — ABNORMAL HIGH (ref 70–99)
Potassium: 4.8 mmol/L (ref 3.5–5.1)
Sodium: 141 mmol/L (ref 135–145)
Total Bilirubin: 0.3 mg/dL (ref 0.3–1.2)
Total Protein: 7 g/dL (ref 6.5–8.1)

## 2021-06-12 LAB — CBC
HCT: 35.3 % — ABNORMAL LOW (ref 36.0–46.0)
Hemoglobin: 10.9 g/dL — ABNORMAL LOW (ref 12.0–15.0)
MCH: 27.7 pg (ref 26.0–34.0)
MCHC: 30.9 g/dL (ref 30.0–36.0)
MCV: 89.6 fL (ref 80.0–100.0)
Platelets: 414 10*3/uL — ABNORMAL HIGH (ref 150–400)
RBC: 3.94 MIL/uL (ref 3.87–5.11)
RDW: 13.8 % (ref 11.5–15.5)
WBC: 8 10*3/uL (ref 4.0–10.5)
nRBC: 0 % (ref 0.0–0.2)

## 2021-06-12 LAB — SARS CORONAVIRUS 2 (TAT 6-24 HRS): SARS Coronavirus 2: NEGATIVE

## 2021-06-12 NOTE — Progress Notes (Signed)
PCP - Cathlean Cower Cardiologist - none  PPM/ICD - denies   Chest x-ray - n/a EKG - 05/01/21 Stress Test - denies ECHO - denies Cardiac Cath - denies  Sleep Study -  CPAP -   NO diabetes  As of today, STOP taking any Aspirin (unless otherwise instructed by your surgeon) Aleve, Naproxen, Ibuprofen, Motrin, Advil, Goody's, BC's, all herbal medications, fish oil, and all vitamins.  ERAS Protcol -yes PRE-SURGERY Ensure or G2- none ordered  COVID TEST- 06/12/21 in PAT   Anesthesia review: no  Patient denies shortness of breath, fever, cough and chest pain at PAT appointment   All instructions explained to the patient, with a verbal understanding of the material. Patient agrees to go over the instructions while at home for a better understanding. Patient also instructed to self quarantine after being tested for COVID-19. The opportunity to ask questions was provided.

## 2021-06-12 NOTE — H&P (Signed)
HPI:   Tammy Wolfe is a 81 y.o. female who presents as a return Patient.   Current problem: Sinusitis.  HPI: Return visit. After 3 weeks on antibiotics and the previous 10-day round, she feels some slight improvement but not completely. She still blowing discolored secretions and having a foul taste on the left side. She also has pain and pressure in the left cheek area. This is been going on for about 3 to 4 months now.  PMH/Meds/All/SocHx/FamHx/ROS:   Past Medical History:  Diagnosis Date   Hypertension   History reviewed. No pertinent surgical history.  No family history of bleeding disorders, wound healing problems or difficulty with anesthesia.   Social History   Socioeconomic History   Marital status: Single  Spouse name: Not on file   Number of children: Not on file   Years of education: Not on file   Highest education level: Not on file  Occupational History   Not on file  Tobacco Use   Smoking status: Former   Smokeless tobacco: Never  Substance and Sexual Activity   Alcohol use: Yes   Drug use: Not on file   Sexual activity: Not on file  Other Topics Concern   Not on file  Social History Narrative   Not on file   Social Determinants of Health   Financial Resource Strain: Not on file  Food Insecurity: Not on file  Transportation Needs: Not on file  Physical Activity: Not on file  Stress: Not on file  Social Connections: Not on file  Housing Stability: Not on file   Current Outpatient Medications:   ALPRAZolam (XANAX) 0.25 MG tablet, TAKE ONE TABLET THREE TIMES DAILY AS NEEDED, Disp: , Rfl:   cholecalciferol, vitamin D3, 25 mcg (1,000 unit) capsule, Take 1,000 Units by mouth daily., Disp: , Rfl:   HYDROcodone-acetaminophen (NORCO) 5-325 mg per tablet, Take 1 tablet by mouth 2 (two) times daily as needed., Disp: , Rfl:   losartan (COZAAR) 50 MG tablet, Take 50 mg by mouth daily., Disp: , Rfl:   sulfamethoxazole-trimethoprim (BACTRIM,SEPTRA) 400-80 mg per  tablet, Take 1 tablet by mouth 2 times daily., Disp: , Rfl:    Physical Exam:   Thin elderly lady in no distress. She has mild tachypnea. No wheezing or distress. Intranasal exam is clear on the right with purulent secretions draining from the middle meatus on the left. Left maxilla is tender. The eye looks normal.  Independent Review of Additional Tests or Records:  Maxillofacial CT reviewed.  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 She has not responded to aggressive antibiotic therapy. She definitely benefit with endoscopic maxillary sinus surgery. Given her COPD I would recommend we do this at the hospital. Surgery should take about 30 or 40 minutes and can be outpatient if she does well. We did discuss there is a chance of complications including pulmonary complications. She understands and agrees.

## 2021-06-12 NOTE — Pre-Procedure Instructions (Signed)
Surgical Instructions    Your procedure is scheduled on Friday 06/14/21.   Report to [facility] Main Entrance "A" at 07:25 A.M., then check in with the Admitting office.  Call this number if you have problems the morning of surgery:  [phone]   If you have any questions prior to your surgery date call [phone]: Open Monday-Friday 8am-4pm    Remember:  Do not eat after midnight the night before your surgery  You may drink clear liquids until 06:25 A.M. the morning of your surgery.   Clear liquids allowed are: Water, Non-Citrus Juices (without pulp), Carbonated Beverages, Clear Tea, Black Coffee ONLY (NO MILK, CREAM OR POWDERED CREAMER of any kind), and Gatorade    Take these medicines the morning of surgery with A SIP OF WATER   sulfamethoxazole-trimethoprim (BACTRIM)   acetaminophen (TYLENOL)- If needed  ALPRAZolam Duanne Moron)- If needed  HYDROcodone-acetaminophen (NORCO/VICODIN)- If needed  oxymetazoline (AFRIN)- If needed   As of today, STOP taking any Aspirin (unless otherwise instructed by your surgeon) Aleve, Naproxen, Ibuprofen, Motrin, Advil, Goody's, BC's, all herbal medications, fish oil, and all vitamins.     After your COVID test   You are not required to quarantine however you are required to wear a well-fitting mask when you are out and around people not in your household.  If your mask becomes wet or soiled, replace with a new one.  Wash your hands often with soap and water for 20 seconds or clean your hands with an alcohol-based hand sanitizer that contains at least 60% alcohol.  Do not share personal items.  Notify your provider: if you are in close contact with someone who has COVID  or if you develop a fever of 100.4 or greater, sneezing, cough, sore throat, shortness of breath or body aches.             Do not wear jewelry or makeup Do not wear lotions, powders, perfumes/colognes, or deodorant. Do not shave 48 hours prior to surgery.   Do not  bring valuables to the hospital. DO Not wear nail polish, gel polish, artificial nails, or any other type of covering on natural nails including finger and toenails. If patients have artificial nails, gel coating, etc. that need to be removed by a nail salon, please have this removed prior to surgery or surgery may need to be canceled/delayed if the surgeon/ anesthesia feels like the patient is unable to be adequately monitored.             [facility] is not responsible for any belongings or valuables.  Do NOT Smoke (Tobacco/Vaping)  24 hours prior to your procedure  If you use a CPAP at night, you may bring your mask for your overnight stay.   Contacts, glasses, hearing aids, dentures or partials may not be worn into surgery, please bring cases for these belongings   For patients admitted to the hospital, discharge time will be determined by your treatment team.   Patients discharged the day of surgery will not be allowed to drive home, and someone needs to stay with them for 24 hours.  NO VISITORS WILL BE ALLOWED IN PRE-OP WHERE PATIENTS ARE PREPPED FOR SURGERY.  ONLY 1 SUPPORT PERSON MAY BE PRESENT IN THE WAITING ROOM WHILE YOU ARE IN SURGERY.  IF YOU ARE TO BE ADMITTED, ONCE YOU ARE IN YOUR ROOM YOU WILL BE ALLOWED TWO (2) VISITORS. 1 (ONE) VISITOR MAY STAY OVERNIGHT BUT MUST ARRIVE TO THE ROOM BY 8pm.  Minor  children may have two parents present. Special consideration for safety and communication needs will be reviewed on a case by case basis.  Special instructions:    Oral Hygiene is also important to reduce your risk of infection.  Remember - BRUSH YOUR TEETH THE MORNING OF SURGERY WITH YOUR REGULAR TOOTHPASTE   [place]- Preparing For Surgery  Before surgery, you can play an important role. Because skin is not sterile, your skin needs to be as free of germs as possible. You can reduce the number of germs on your skin by washing with CHG (chlorahexidine gluconate) Soap before  surgery.  CHG is an antiseptic cleaner which kills germs and bonds with the skin to continue killing germs even after washing.     Please do not use if you have an allergy to CHG or antibacterial soaps. If your skin becomes reddened/irritated stop using the CHG.  Do not shave (including legs and underarms) for at least 48 hours prior to first CHG shower. It is OK to shave your face.  Please follow these instructions carefully.     Shower the NIGHT BEFORE SURGERY and the MORNING OF SURGERY with CHG Soap.   If you chose to wash your hair, wash your hair first as usual with your normal shampoo. After you shampoo, rinse your hair and body thoroughly to remove the shampoo.  Then [employer] and genitals (private parts) with your normal soap and rinse thoroughly to remove soap.  After that Use CHG Soap as you would any other liquid soap. You can apply CHG directly to the skin and wash gently with a scrungie or a clean washcloth.   Apply the CHG Soap to your body ONLY FROM THE NECK DOWN.  Do not use on open wounds or open sores. Avoid contact with your eyes, ears, mouth and genitals (private parts). Wash Face and genitals (private parts)  with your normal soap.   Wash thoroughly, paying special attention to the area where your surgery will be performed.  Thoroughly rinse your body with warm water from the neck down.  DO NOT shower/wash with your normal soap after using and rinsing off the CHG Soap.  Pat yourself dry with a CLEAN TOWEL.  Wear CLEAN PAJAMAS to bed the night before surgery  Place CLEAN SHEETS on your bed the night before your surgery  DO NOT SLEEP WITH PETS.   Day of Surgery: Take a shower with CHG soap. Wear Clean/Comfortable clothing the morning of surgery Do not apply any deodorants/lotions.   Remember to brush your teeth WITH YOUR REGULAR TOOTHPASTE.   Please read over the following fact sheets that you were given.

## 2021-06-14 ENCOUNTER — Ambulatory Visit (HOSPITAL_COMMUNITY): Payer: Medicare Other | Admitting: Anesthesiology

## 2021-06-14 ENCOUNTER — Encounter (HOSPITAL_COMMUNITY)
Admission: RE | Disposition: A | Discharge status: Home / Self Care | Payer: Self-pay | Source: Home / Self Care | Attending: Otolaryngology

## 2021-06-14 ENCOUNTER — Encounter (HOSPITAL_COMMUNITY): Payer: Self-pay | Admitting: Otolaryngology

## 2021-06-14 ENCOUNTER — Other Ambulatory Visit: Payer: Self-pay

## 2021-06-14 ENCOUNTER — Ambulatory Visit (HOSPITAL_COMMUNITY)
Admission: RE | Admit: 2021-06-14 | Discharge: 2021-06-14 | Disposition: A | Discharge status: Home / Self Care | Payer: Medicare Other | Attending: Otolaryngology | Admitting: Otolaryngology

## 2021-06-14 DIAGNOSIS — J449 Chronic obstructive pulmonary disease, unspecified: Secondary | ICD-10-CM | POA: Diagnosis not present

## 2021-06-14 DIAGNOSIS — I10 Essential (primary) hypertension: Secondary | ICD-10-CM | POA: Insufficient documentation

## 2021-06-14 DIAGNOSIS — J320 Chronic maxillary sinusitis: Secondary | ICD-10-CM | POA: Insufficient documentation

## 2021-06-14 DIAGNOSIS — F419 Anxiety disorder, unspecified: Secondary | ICD-10-CM | POA: Diagnosis not present

## 2021-06-14 DIAGNOSIS — Z87891 Personal history of nicotine dependence: Secondary | ICD-10-CM | POA: Insufficient documentation

## 2021-06-14 DIAGNOSIS — N289 Disorder of kidney and ureter, unspecified: Secondary | ICD-10-CM | POA: Diagnosis not present

## 2021-06-14 HISTORY — PX: NASAL SINUS SURGERY: SHX719

## 2021-06-14 SURGERY — SINUS SURGERY, ENDOSCOPIC
Anesthesia: General | Laterality: Left

## 2021-06-14 MED ORDER — FENTANYL CITRATE (PF) 100 MCG/2ML IJ SOLN
25.0000 ug | INTRAMUSCULAR | Status: DC | PRN
  Administered 2021-06-14: 25 ug via INTRAVENOUS

## 2021-06-14 MED ORDER — ONDANSETRON HCL 4 MG/2ML IJ SOLN
INTRAMUSCULAR | Status: DC | PRN
  Administered 2021-06-14: 4 mg via INTRAVENOUS

## 2021-06-14 MED ORDER — LIDOCAINE-EPINEPHRINE 1 %-1:100000 IJ SOLN
INTRAMUSCULAR | Status: AC
  Filled 2021-06-14: qty 1

## 2021-06-14 MED ORDER — ACETAMINOPHEN 500 MG PO TABS
500.0000 mg | ORAL_TABLET | Freq: Once | ORAL | Status: AC
  Administered 2021-06-14: 500 mg via ORAL
  Filled 2021-06-14: qty 1

## 2021-06-14 MED ORDER — LIDOCAINE 2% (20 MG/ML) 5 ML SYRINGE
INTRAMUSCULAR | Status: DC | PRN
  Administered 2021-06-14: 30 mg via INTRAVENOUS

## 2021-06-14 MED ORDER — DEXAMETHASONE SODIUM PHOSPHATE 10 MG/ML IJ SOLN
INTRAMUSCULAR | Status: AC
  Filled 2021-06-14: qty 2

## 2021-06-14 MED ORDER — PROPOFOL 10 MG/ML IV BOLUS
INTRAVENOUS | Status: AC
  Filled 2021-06-14: qty 20

## 2021-06-14 MED ORDER — SUGAMMADEX SODIUM 200 MG/2ML IV SOLN
INTRAVENOUS | Status: DC | PRN
  Administered 2021-06-14: 200 mg via INTRAVENOUS

## 2021-06-14 MED ORDER — ORAL CARE MOUTH RINSE: 15.0000 mL | Freq: Once | OROMUCOSAL | Status: AC

## 2021-06-14 MED ORDER — FENTANYL CITRATE (PF) 250 MCG/5ML IJ SOLN
INTRAMUSCULAR | Status: DC | PRN
  Administered 2021-06-14: 25 ug via INTRAVENOUS
  Administered 2021-06-14: 50 ug via INTRAVENOUS

## 2021-06-14 MED ORDER — FENTANYL CITRATE (PF) 100 MCG/2ML IJ SOLN
INTRAMUSCULAR | Status: AC
  Administered 2021-06-14: 25 ug via INTRAVENOUS
  Filled 2021-06-14: qty 2

## 2021-06-14 MED ORDER — ROCURONIUM BROMIDE 10 MG/ML (PF) SYRINGE
PREFILLED_SYRINGE | INTRAVENOUS | Status: AC
  Filled 2021-06-14: qty 10

## 2021-06-14 MED ORDER — FENTANYL CITRATE (PF) 250 MCG/5ML IJ SOLN
INTRAMUSCULAR | Status: AC
  Filled 2021-06-14: qty 5

## 2021-06-14 MED ORDER — OXYMETAZOLINE HCL 0.05 % NA SOLN
NASAL | Status: DC | PRN
  Administered 2021-06-14: 1 via TOPICAL

## 2021-06-14 MED ORDER — PROPOFOL 10 MG/ML IV BOLUS
INTRAVENOUS | Status: DC | PRN
  Administered 2021-06-14: 50 mg via INTRAVENOUS

## 2021-06-14 MED ORDER — 0.9 % SODIUM CHLORIDE (POUR BTL) OPTIME
TOPICAL | Status: DC | PRN
  Administered 2021-06-14: 1000 mL

## 2021-06-14 MED ORDER — LIDOCAINE-EPINEPHRINE 1 %-1:100000 IJ SOLN
INTRAMUSCULAR | Status: DC | PRN
  Administered 2021-06-14: 7 mL

## 2021-06-14 MED ORDER — ROCURONIUM BROMIDE 10 MG/ML (PF) SYRINGE
PREFILLED_SYRINGE | INTRAVENOUS | Status: DC | PRN
  Administered 2021-06-14: 30 mg via INTRAVENOUS

## 2021-06-14 MED ORDER — ARTIFICIAL TEARS OPHTHALMIC OINT
TOPICAL_OINTMENT | OPHTHALMIC | Status: DC | PRN
Start: 2021-06-14 — End: 2021-06-14
  Administered 2021-06-14: 1 via OPHTHALMIC

## 2021-06-14 MED ORDER — ATROPINE SULFATE 0.4 MG/ML IV SOLN
INTRAVENOUS | Status: AC
  Filled 2021-06-14: qty 1

## 2021-06-14 MED ORDER — LACTATED RINGERS IV SOLN: INTRAVENOUS | Status: DC

## 2021-06-14 MED ORDER — PHENYLEPHRINE 40 MCG/ML (10ML) SYRINGE FOR IV PUSH (FOR BLOOD PRESSURE SUPPORT)
PREFILLED_SYRINGE | INTRAVENOUS | Status: AC
  Filled 2021-06-14: qty 10

## 2021-06-14 MED ORDER — ONDANSETRON HCL 4 MG/2ML IJ SOLN
INTRAMUSCULAR | Status: AC
  Filled 2021-06-14: qty 4

## 2021-06-14 MED ORDER — BACITRACIN ZINC 500 UNIT/GM EX OINT
TOPICAL_OINTMENT | CUTANEOUS | Status: AC
  Filled 2021-06-14: qty 28.35

## 2021-06-14 MED ORDER — PHENYLEPHRINE 40 MCG/ML (10ML) SYRINGE FOR IV PUSH (FOR BLOOD PRESSURE SUPPORT)
PREFILLED_SYRINGE | INTRAVENOUS | Status: DC | PRN
  Administered 2021-06-14 (×2): 120 ug via INTRAVENOUS
  Administered 2021-06-14: 80 ug via INTRAVENOUS
  Administered 2021-06-14: 40 ug via INTRAVENOUS
  Administered 2021-06-14: 80 ug via INTRAVENOUS
  Administered 2021-06-14: 40 ug via INTRAVENOUS

## 2021-06-14 MED ORDER — CHLORHEXIDINE GLUCONATE 0.12 % MT SOLN
15.0000 mL | Freq: Once | OROMUCOSAL | Status: AC
  Administered 2021-06-14: 15 mL via OROMUCOSAL
  Filled 2021-06-14: qty 15

## 2021-06-14 MED ORDER — ARTIFICIAL TEARS OPHTHALMIC OINT
TOPICAL_OINTMENT | OPHTHALMIC | Status: AC
  Filled 2021-06-14: qty 3.5

## 2021-06-14 MED ORDER — DEXAMETHASONE SODIUM PHOSPHATE 10 MG/ML IJ SOLN
INTRAMUSCULAR | Status: DC | PRN
  Administered 2021-06-14: 10 mg via INTRAVENOUS

## 2021-06-14 MED ORDER — OXYMETAZOLINE HCL 0.05 % NA SOLN
2.0000 | NASAL | Status: DC
  Administered 2021-06-14: 2 via NASAL
  Filled 2021-06-14: qty 30

## 2021-06-14 MED ORDER — SODIUM CHLORIDE 0.9 % IR SOLN
Status: DC | PRN
  Administered 2021-06-14: 1000 mL

## 2021-06-14 MED ORDER — SUCCINYLCHOLINE CHLORIDE 200 MG/10ML IV SOSY
PREFILLED_SYRINGE | INTRAVENOUS | Status: AC
  Filled 2021-06-14: qty 10

## 2021-06-14 MED ORDER — ONDANSETRON HCL 4 MG/2ML IJ SOLN: 3.0000 mg | Freq: Once | INTRAMUSCULAR | Status: DC | PRN

## 2021-06-14 SURGICAL SUPPLY — 34 items
BAG COUNTER SPONGE SURGICOUNT (BAG) ×2 IMPLANT
BLADE RAD40 ROTATE 4M 4 5PK (BLADE) IMPLANT
BLADE RAD60 ROTATE M4 4 5PK (BLADE) IMPLANT
BLADE TRICUT ROTATE M4 4 5PK (BLADE) ×2 IMPLANT
CANISTER SUCT 3000ML PPV (MISCELLANEOUS) ×2 IMPLANT
DRAPE HALF SHEET 40X57 (DRAPES) IMPLANT
DRESSING NASAL KENNEDY 3.5X.9 (MISCELLANEOUS) IMPLANT
DRSG NASAL KENNEDY 3.5X.9 (MISCELLANEOUS)
DRSG NASOPORE 8CM (GAUZE/BANDAGES/DRESSINGS) IMPLANT
ELECT REM PT RETURN 9FT ADLT (ELECTROSURGICAL)
ELECTRODE REM PT RTRN 9FT ADLT (ELECTROSURGICAL) IMPLANT
FILTER ARTHROSCOPY CONVERTOR (FILTER) IMPLANT
GLOVE SURG LTX SZ7.5 (GLOVE) ×2 IMPLANT
GOWN STRL REUS W/ TWL LRG LVL3 (GOWN DISPOSABLE) ×2 IMPLANT
GOWN STRL REUS W/TWL LRG LVL3 (GOWN DISPOSABLE) ×4
KIT BASIN OR (CUSTOM PROCEDURE TRAY) ×2 IMPLANT
KIT TURNOVER KIT B (KITS) ×2 IMPLANT
NDL PRECISIONGLIDE 27X1.5 (NEEDLE) ×1 IMPLANT
NEEDLE PRECISIONGLIDE 27X1.5 (NEEDLE) ×2 IMPLANT
NS IRRIG 1000ML POUR BTL (IV SOLUTION) ×2 IMPLANT
PAD ARMBOARD 7.5X6 YLW CONV (MISCELLANEOUS) ×4 IMPLANT
PATTIES SURGICAL .5 X3 (DISPOSABLE) ×2 IMPLANT
SHEATH ENDOSCRUB 0 DEG (SHEATH) IMPLANT
SHEATH ENDOSCRUB 30 DEG (SHEATH) IMPLANT
SPECIMEN JAR SMALL (MISCELLANEOUS) ×2 IMPLANT
SWAB COLLECTION DEVICE MRSA (MISCELLANEOUS) IMPLANT
SWAB CULTURE ESWAB REG 1ML (MISCELLANEOUS) IMPLANT
SYR 50ML SLIP (SYRINGE) IMPLANT
TOWEL GREEN STERILE FF (TOWEL DISPOSABLE) ×2 IMPLANT
TRAY ENT MC OR (CUSTOM PROCEDURE TRAY) ×2 IMPLANT
TUBE CONNECTING 12X1/4 (SUCTIONS) ×2 IMPLANT
TUBING EXTENTION W/L.L. (IV SETS) IMPLANT
WATER STERILE IRR 1000ML POUR (IV SOLUTION) ×2 IMPLANT
YANKAUER SUCT BULB TIP NO VENT (SUCTIONS) ×1 IMPLANT

## 2021-06-14 NOTE — Anesthesia Preprocedure Evaluation (Addendum)
Anesthesia Evaluation  Patient identified by MRN, date of birth, ID band Patient awake    Reviewed: Allergy & Precautions, NPO status , Patient's Chart, lab work & pertinent test results  Airway Mallampati: I  TM Distance: >3 FB Neck ROM: Full    Dental  (+) Teeth Intact, Dental Advisory Given, Missing,    Pulmonary COPD (no inhalers), former smoker,  Quit smoking 01/2021   Pulmonary exam normal breath sounds clear to auscultation       Cardiovascular hypertension (SBP 130s in preop), Pt. on medications Normal cardiovascular exam Rhythm:Regular Rate:Normal     Neuro/Psych PSYCHIATRIC DISORDERS Anxiety negative neurological ROS     GI/Hepatic negative GI ROS, (+)     substance abuse (remote hx alcoholism )  ,   Endo/Other  negative endocrine ROS  Renal/GU Renal InsufficiencyRenal diseaseCr 1.07  negative genitourinary   Musculoskeletal  (+) Arthritis , Osteoarthritis,    Abdominal   Peds  Hematology negative hematology ROS (+) hct 35.3, ptl 414   Anesthesia Other Findings Chronic left maxillary sinusitis    Hx jaw surgery 2009  Reproductive/Obstetrics negative OB ROS                            Anesthesia Physical Anesthesia Plan  ASA: 3  Anesthesia Plan: General   Post-op Pain Management: Tylenol PO (pre-op)   Induction: Intravenous  PONV Risk Score and Plan: 4 or greater and Ondansetron, Dexamethasone and Treatment may vary due to age or medical condition  Airway Management Planned: Oral ETT  Additional Equipment: None  Intra-op Plan:   Post-operative Plan: Extubation in OR  Informed Consent: I have reviewed the patients History and Physical, chart, labs and discussed the procedure including the risks, benefits and alternatives for the proposed anesthesia with the patient or authorized representative who has indicated his/her understanding and acceptance.     Dental  advisory given  Plan Discussed with: CRNA  Anesthesia Plan Comments:        Anesthesia Quick Evaluation

## 2021-06-14 NOTE — Op Note (Signed)
OPERATIVE REPORT  DATE OF SURGERY: 06/14/2021  PATIENT:  Tammy Wolfe,  81 y.o. female  PRE-OPERATIVE DIAGNOSIS:  Chronic left maxillary sinusitis  POST-OPERATIVE DIAGNOSIS:  Chronic left maxillary sinusitis  PROCEDURE:  Procedure(s): ENDOSCOPIC MAXILLARY ANTROSTOMY WITH REMOVAL OF TISSUE  SURGEON:  Beckie Salts, MD  ASSISTANTS: None  ANESTHESIA:   General   EBL: 100 ml  DRAINS: None  LOCAL MEDICATIONS USED: 1% Xylocaine with epinephrine  SPECIMEN: Left maxillary sinus contents  COUNTS:  Correct  PROCEDURE DETAILS: The patient was taken to the operating room and placed on the operating table in the supine position. Following induction of general endotracheal anesthesia, the face was draped in a standard fashion.  Oxymetazoline spray was used preoperatively in the in the nasal cavities.  1% Xylocaine with epinephrine was infiltrated into the left septum and middle turbinate as well as the lateral nasal wall and the soft tissue mass that was identified in the nasal cavity and middle meatus.  The microdebrider was used initially with a 0 degree endoscope to clear out the soft tissue mass from the nasal cavity.  There are purulent secretions.  There is obvious fungal mucin identified.  There is inflammatory tissue obscuring the natural ostium of the maxillary sinus which was removed all using the microdebrider.  Once the antrum was exposed a large fungus ball was seen filling the maxillary sinus.  This was cleared out using curved suction and also using saline irrigation with a curved suction to clear out the debris.  Once all of the debris was removed sinus was inspected and there is some granulation tissue around the mucosa.  Part of this was removed with suction.  There is no obvious neoplastic type mass.  Afrin pledgets were placed along the posteroinferior aspect of the antrostomy until the patient was awakened and extubated.  These were then removed.  Patient was awakened extubated  and transferred to recovery in stable condition.    PATIENT DISPOSITION:  To PACU, stable

## 2021-06-14 NOTE — Interval H&P Note (Signed)
History and Physical Interval Note:  06/14/2021 8:57 AM  Tammy Wolfe  has presented today for surgery, with the diagnosis of Chronic left maxillary sinusitis.  The various methods of treatment have been discussed with the patient and family. After consideration of risks, benefits and other options for treatment, the patient has consented to  Procedure(s): ENDOSCOPIC MAXILLARY ANTROSTOMY WITH REMOVAL OF TISSUE (Left) as a surgical intervention.  The patient's history has been reviewed, patient examined, no change in status, stable for surgery.  I have reviewed the patient's chart and labs.  Questions were answered to the patient's satisfaction.     Izora Gala

## 2021-06-14 NOTE — Anesthesia Postprocedure Evaluation (Signed)
Anesthesia Post Note  Patient: Tammy Wolfe  Procedure(s) Performed: ENDOSCOPIC MAXILLARY ANTROSTOMY WITH REMOVAL OF TISSUE (Left)     Patient location during evaluation: PACU Anesthesia Type: General Level of consciousness: awake and alert Pain management: pain level controlled Vital Signs Assessment: post-procedure vital signs reviewed and stable Respiratory status: spontaneous breathing, nonlabored ventilation and respiratory function stable Cardiovascular status: stable and blood pressure returned to baseline Anesthetic complications: no   No notable events documented.  Last Vitals:  Vitals:   06/14/21 1125 06/14/21 1140  BP: (!) 121/53 132/72  Pulse: 80 77  Resp: 20 19  Temp:  36.8 C  SpO2: 100% 97%    Last Pain:  Vitals:   06/14/21 1140  TempSrc:   PainSc: [place]

## 2021-06-14 NOTE — Transfer of Care (Signed)
Immediate Anesthesia Transfer of Care Note  Patient: Tammy Wolfe  Procedure(s) Performed: ENDOSCOPIC MAXILLARY ANTROSTOMY WITH REMOVAL OF TISSUE (Left)  Patient Location: PACU  Anesthesia Type:General  Level of Consciousness: awake, alert  and patient cooperative  Airway & Oxygen Therapy: Patient Spontanous Breathing  Post-op Assessment: Report given to RN and Post -op Vital signs reviewed and stable  Post vital signs: Reviewed and stable  Last Vitals:  Vitals Value Taken Time  BP 147/75 06/14/21 1110  Temp 36.4 C 06/14/21 1110  Pulse 87 06/14/21 1112  Resp 14 06/14/21 1112  SpO2 100 % 06/14/21 1112  Vitals shown include unvalidated device data.  Last Pain:  Vitals:   06/14/21 1110  TempSrc:   PainSc: 0-No pain      Patients Stated Pain Goal: 2 (14/99/69 [id number])  Complications: No notable events documented.

## 2021-06-14 NOTE — Anesthesia Procedure Notes (Signed)
Procedure Name: Intubation Date/Time: 06/14/2021 10:09 AM Performed by: Janace Litten, CRNA Pre-anesthesia Checklist: Patient identified, Emergency Drugs available, Suction available and Patient being monitored Patient Re-evaluated:Patient Re-evaluated prior to induction Oxygen Delivery Method: Circle System Utilized Preoxygenation: Pre-oxygenation with 100% oxygen Induction Type: IV induction Ventilation: Mask ventilation without difficulty Laryngoscope Size: Mac and 3 Grade View: Grade I Tube type: Oral Tube size: 6.0 mm Number of attempts: 1 Airway Equipment and Method: Stylet and Oral airway Placement Confirmation: ETT inserted through vocal cords under direct vision, positive ETCO2 and breath sounds checked- equal and bilateral Secured at: 18 cm Tube secured with: Tape Dental Injury: Teeth and Oropharynx as per pre-operative assessment

## 2021-06-14 NOTE — Discharge Instructions (Signed)
Use saline nasal spray which can be purchased over-the-counter at any pharmacy 20-30 times daily.  It is okay to use Afrin nasal spray which is also available over-the-counter if there is any bleeding.  If there is any heavy bleeding come to the emergency department or contact our office.

## 2021-06-15 ENCOUNTER — Encounter (HOSPITAL_COMMUNITY): Payer: Self-pay | Admitting: Otolaryngology

## 2021-06-17 LAB — SURGICAL PATHOLOGY

## 2021-10-30 ENCOUNTER — Ambulatory Visit: Payer: Medicare Other | Admitting: Internal Medicine

## 2021-11-04 ENCOUNTER — Encounter: Payer: Self-pay | Admitting: Internal Medicine

## 2021-11-04 ENCOUNTER — Ambulatory Visit (INDEPENDENT_AMBULATORY_CARE_PROVIDER_SITE_OTHER): Payer: Medicare Other | Admitting: Internal Medicine

## 2021-11-04 VITALS — BP 170/92 | HR 134 | Temp 97.5°F | Ht 59.0 in | Wt 89.0 lb

## 2021-11-04 DIAGNOSIS — E7800 Pure hypercholesterolemia, unspecified: Secondary | ICD-10-CM

## 2021-11-04 DIAGNOSIS — I10 Essential (primary) hypertension: Secondary | ICD-10-CM

## 2021-11-04 DIAGNOSIS — N183 Chronic kidney disease, stage 3 unspecified: Secondary | ICD-10-CM | POA: Insufficient documentation

## 2021-11-04 DIAGNOSIS — R7302 Impaired glucose tolerance (oral): Secondary | ICD-10-CM

## 2021-11-04 DIAGNOSIS — E538 Deficiency of other specified B group vitamins: Secondary | ICD-10-CM

## 2021-11-04 DIAGNOSIS — E559 Vitamin D deficiency, unspecified: Secondary | ICD-10-CM

## 2021-11-04 DIAGNOSIS — F411 Generalized anxiety disorder: Secondary | ICD-10-CM

## 2021-11-04 DIAGNOSIS — R252 Cramp and spasm: Secondary | ICD-10-CM | POA: Insufficient documentation

## 2021-11-04 DIAGNOSIS — G8929 Other chronic pain: Secondary | ICD-10-CM

## 2021-11-04 DIAGNOSIS — M545 Low back pain, unspecified: Secondary | ICD-10-CM

## 2021-11-04 DIAGNOSIS — Z0001 Encounter for general adult medical examination with abnormal findings: Secondary | ICD-10-CM

## 2021-11-04 DIAGNOSIS — N1831 Chronic kidney disease, stage 3a: Secondary | ICD-10-CM

## 2021-11-04 LAB — LIPID PANEL
Cholesterol: 188 mg/dL (ref 0–200)
HDL: 57 mg/dL (ref >39.00)
LDL Cholesterol: 97 mg/dL (ref 0–99)
NonHDL: 131.38
Total CHOL/HDL Ratio: 3
Triglycerides: 170 mg/dL — ABNORMAL HIGH (ref 0.0–149.0)
VLDL: 34 mg/dL (ref 0.0–40.0)

## 2021-11-04 LAB — CBC WITH DIFFERENTIAL/PLATELET
Basophils Absolute: 0 10*3/uL (ref 0.0–0.1)
Basophils Relative: 0.5 % (ref 0.0–3.0)
Eosinophils Absolute: 0.1 10*3/uL (ref 0.0–0.7)
Eosinophils Relative: 1 % (ref 0.0–5.0)
HCT: 33.5 % — ABNORMAL LOW (ref 36.0–46.0)
Hemoglobin: 10.8 g/dL — ABNORMAL LOW (ref 12.0–15.0)
Lymphocytes Relative: 21.5 % (ref 12.0–46.0)
Lymphs Abs: 1.8 10*3/uL (ref 0.7–4.0)
MCHC: 32.2 g/dL (ref 30.0–36.0)
MCV: 80.2 fl (ref 78.0–100.0)
Monocytes Absolute: 0.4 10*3/uL (ref 0.1–1.0)
Monocytes Relative: 5.2 % (ref 3.0–12.0)
Neutro Abs: 6.1 10*3/uL (ref 1.4–7.7)
Neutrophils Relative %: 71.8 % (ref 43.0–77.0)
Platelets: 363 10*3/uL (ref 150.0–400.0)
RBC: 4.17 Mil/uL (ref 3.87–5.11)
RDW: 16.7 % — ABNORMAL HIGH (ref 11.5–15.5)
WBC: 8.5 10*3/uL (ref 4.0–10.5)

## 2021-11-04 LAB — HEPATIC FUNCTION PANEL
ALT: 14 U/L (ref 0–35)
AST: 18 U/L (ref 0–37)
Albumin: 3.7 g/dL (ref 3.5–5.2)
Alkaline Phosphatase: 74 U/L (ref 39–117)
Bilirubin, Direct: 0 mg/dL (ref 0.0–0.3)
Total Bilirubin: 0.2 mg/dL (ref 0.2–1.2)
Total Protein: 6.8 g/dL (ref 6.0–8.3)

## 2021-11-04 LAB — BASIC METABOLIC PANEL
BUN: 24 mg/dL — ABNORMAL HIGH (ref 6–23)
CO2: 22 mEq/L (ref 19–32)
Calcium: 9.1 mg/dL (ref 8.4–10.5)
Chloride: 100 mEq/L (ref 96–112)
Creatinine, Ser: 1.23 mg/dL — ABNORMAL HIGH (ref 0.40–1.20)
GFR: 41.28 mL/min — ABNORMAL LOW (ref >60.00)
Glucose, Bld: 150 mg/dL — ABNORMAL HIGH (ref 70–99)
Potassium: 4.1 mEq/L (ref 3.5–5.1)
Sodium: 134 mEq/L — ABNORMAL LOW (ref 135–145)

## 2021-11-04 LAB — TSH: TSH: 0.64 u[IU]/mL (ref 0.35–5.50)

## 2021-11-04 LAB — VITAMIN B12: Vitamin B-12: 508 pg/mL (ref 211–911)

## 2021-11-04 LAB — HEMOGLOBIN A1C: Hgb A1c MFr Bld: 6.3 % (ref 4.6–6.5)

## 2021-11-04 LAB — VITAMIN D 25 HYDROXY (VIT D DEFICIENCY, FRACTURES): VITD: 67.66 ng/mL (ref 30.00–100.00)

## 2021-11-04 MED ORDER — HYDROCODONE-ACETAMINOPHEN 5-325 MG PO TABS: 1.0000 | ORAL_TABLET | Freq: Two times a day (BID) | ORAL | 0 refills | Status: DC | PRN

## 2021-11-04 MED ORDER — ALPRAZOLAM 0.25 MG PO TABS: ORAL_TABLET | ORAL | 5 refills | Status: DC

## 2021-11-04 MED ORDER — CYCLOBENZAPRINE HCL 5 MG PO TABS: ORAL_TABLET | ORAL | 3 refills | Status: DC

## 2021-11-04 NOTE — Assessment & Plan Note (Signed)
Also for flexeril 5 qhs prn,  to f/u any worsening symptoms or concerns

## 2021-11-04 NOTE — Progress Notes (Signed)
Patient ID: Tammy Wolfe, female   DOB: 01/23/1941, 81 y.o.   MRN: [id number]         Chief Complaint:: wellness exam and htn, anxiety, chronic back pain, nocturnal leg cramps       HPI:  Tammy Wolfe is a 81 y.o. female here for wellness exam; due for shingrix and will check at the pharmacy, declines covid booster, tdap, o/w up to date                        Also BP at home < 140/90.  Pt denies chest pain, increased sob or doe, wheezing, orthopnea, PND, increased LE swelling, palpitations, dizziness or syncope.   Pt denies polydipsia, polyuria, or new focal neuro s/s.    Pt denies fever, wt loss, night sweats, loss of appetite, or other constitutional symptoms  Does have nocturnal leg and feet cramping in the last 2 mo.  Pt continues to have recurring LBP without change in severity, bowel or bladder change, fever, wt loss,  worsening LE pain/numbness/weakness, gait change or falls.     Wt Readings from Last 3 Encounters:  11/04/21 89 lb (40.4 kg)  06/14/21 78 lb (35.4 kg)  06/12/21 78 lb 1.6 oz (35.4 kg)   BP Readings from Last 3 Encounters:  11/04/21 (!) 170/92  06/14/21 132/72  06/12/21 129/83   Immunization History  Administered Date(s) Administered   Fluad Quad(high Dose 65+) 04/05/2019, 04/25/2020, 05/01/2021   Influenza Whole 03/19/2007   Influenza, High Dose Seasonal PF 03/08/2013, 03/07/2014, 03/14/2015, 03/26/2017, 03/30/2018   Influenza,inj,Quad PF,6+ Mos 03/12/2016   PFIZER(Purple Top)SARS-COV-2 Vaccination 07/24/2019, 08/17/2019, 03/19/2020   Pneumococcal Conjugate-13 03/29/2013   Pneumococcal Polysaccharide-23 01/21/2006   Td 11/12/2009   Zoster, Live 01/21/2006   There are no preventive care reminders to display for this patient.     Past Medical History:  Diagnosis Date   ABNORMAL THYROID FUNCTION TESTS 62/69/4854   ALCOHOLIC HEPATITIS, HX OF 02/14/2007   ALLERGIC RHINITIS 02/14/2007   Anemia, unspecified 04/26/2012   ANXIETY 03/19/2007   Breast cancer ([facility])     Breast implant removal status 12/23/2008   COLONIC POLYPS, HX OF 02/14/2007   COPD (chronic obstructive pulmonary disease) ([place])    Coronary artery calcification seen on CT scan 10/31/2013   GOITER, MULTINODULAR 02/14/2007   HX, PERSONAL, ALCOHOLISM 02/14/2007   HYPERLIPIDEMIA 02/14/2007   HYPERTENSION 02/14/2007   Insomnia    OSTEOARTHRITIS 02/14/2007   OSTEOPOROSIS 02/14/2007   PANCREATITIS, HX OF 02/14/2007   Smoker 09/06/2013   Past Surgical History:  Procedure Laterality Date   BREAST SURGERY  6270   silicone prepectoral implants explanted by Dr. Towanda Malkin after rupture   BREAST SURGERY     left simple mastectomy and left axillary sentinel node biopsy, right prophylactic mastectomy   CATARACT EXTRACTION     NASAL SINUS SURGERY Left 06/14/2021   Procedure: ENDOSCOPIC MAXILLARY ANTROSTOMY WITH REMOVAL OF TISSUE;  Surgeon: Izora Gala, MD;  Location: [facility] OR;  Service: ENT;  Laterality: Left;   peridontal  2010   s/p jaw and periodontal surgury  2009    reports that she quit smoking about 9 months ago. Her smoking use included cigarettes. She smoked an average of .5 packs per day. She has never used smokeless tobacco. She reports that she does not currently use alcohol. She reports that she does not use drugs. family history includes Hyperlipidemia in her brother. She was adopted. Allergies  Allergen Reactions   Tetracycline Anaphylaxis  Lovastatin Other (See Comments)    Muscle cramps   Pravastatin Other (See Comments)    Leg cramps   Current Outpatient Medications on File Prior to Visit  Medication Sig Dispense Refill   acetaminophen (TYLENOL) 325 MG tablet Take 650 mg by mouth every 6 (six) hours as needed.     Cholecalciferol (VITAMIN D) 1000 UNITS capsule Take 1,000 Units by mouth daily.       cyanocobalamin 1000 MCG tablet Take 1,000 mcg by mouth daily.     losartan (COZAAR) 50 MG tablet Take 1 tablet (50 mg total) by mouth daily. 90 tablet 3   losartan (COZAAR) 50  MG tablet Take 1 tablet by mouth daily.     oxymetazoline (AFRIN) 0.05 % nasal spray Place 1 spray into both nostrils daily as needed for congestion.     sulfamethoxazole-trimethoprim (BACTRIM) 400-80 MG tablet Take 1 tablet by mouth daily.     trimethoprim (TRIMPEX) 100 MG tablet Take 1 tablet (100 mg total) by mouth daily. 90 tablet 3   No current facility-administered medications on file prior to visit.        ROS:  All others reviewed and negative.  Objective        PE:  BP (!) 170/92 (BP Location: Right Arm, Patient Position: Sitting, Cuff Size: Normal)   Pulse (!) 134   Temp (!) 97.5 F (36.4 C) (Oral)   Ht '4\' 11"'$  (1.499 m)   Wt 89 lb (40.4 kg)   SpO2 97%   BMI 17.98 kg/m                 Constitutional: Pt appears in NAD               HENT: Head: NCAT.                Right Ear: External ear normal.                 Left Ear: External ear normal.                Eyes: . Pupils are equal, round, and reactive to light. Conjunctivae and EOM are normal               Nose: without d/c or deformity               Neck: Neck supple. Gross normal ROM               Cardiovascular: Normal rate and regular rhythm.                 Pulmonary/Chest: Effort normal and breath sounds without rales or wheezing.                Abd:  Soft, NT, ND, + BS, no organomegaly               Neurological: Pt is alert. At baseline orientation, motor grossly intact               Skin: Skin is warm. No rashes, no other new lesions, LE edema - none               Psychiatric: Pt behavior is normal without agitation   Micro: none  Cardiac tracings I have personally interpreted today:  none  Pertinent Radiological findings (summarize): none   Lab Results  Component Value Date   WBC 8.5 11/04/2021   HGB 10.8 (L) 11/04/2021   HCT 33.5 (L) 11/04/2021  PLT 363.0 11/04/2021   GLUCOSE 150 (H) 11/04/2021   CHOL 188 11/04/2021   TRIG 170.0 (H) 11/04/2021   HDL 57.00 11/04/2021   LDLCALC 97 11/04/2021    ALT 14 11/04/2021   AST 18 11/04/2021   NA 134 (L) 11/04/2021   K 4.1 11/04/2021   CL 100 11/04/2021   CREATININE 1.23 (H) 11/04/2021   BUN 24 (H) 11/04/2021   CO2 22 11/04/2021   TSH 0.64 11/04/2021   HGBA1C 6.3 11/04/2021   Assessment/Plan:  Keniyah Gelinas is a 81 y.o. White or Caucasian [1] female with  has a past medical history of ABNORMAL THYROID FUNCTION TESTS (13/24/4010), ALCOHOLIC HEPATITIS, HX OF (02/14/2007), ALLERGIC RHINITIS (02/14/2007), Anemia, unspecified (04/26/2012), ANXIETY (03/19/2007), Breast cancer ([facility]), Breast implant removal status (12/23/2008), COLONIC POLYPS, HX OF (02/14/2007), COPD (chronic obstructive pulmonary disease) ([place]), Coronary artery calcification seen on CT scan (10/31/2013), GOITER, MULTINODULAR (02/14/2007), HX, PERSONAL, ALCOHOLISM (02/14/2007), HYPERLIPIDEMIA (02/14/2007), HYPERTENSION (02/14/2007), Insomnia, OSTEOARTHRITIS (02/14/2007), OSTEOPOROSIS (02/14/2007), PANCREATITIS, HX OF (02/14/2007), and Smoker (09/06/2013).  Encounter for well adult exam with abnormal findings Age and sex appropriate education and counseling updated with regular exercise and diet Referrals for preventative services - none needed Immunizations addressed - declines shingrix, tdap, covid booster Smoking counseling  - none needed Evidence for depression or other mood disorder - chronic anxiety stable Most recent labs reviewed. I have personally reviewed and have noted: 1) the patient's medical and social history 2) The patient's current medications and supplements 3) The patient's height, weight, and BMI have been recorded in the chart   Impaired glucose tolerance Lab Results  Component Value Date   HGBA1C 6.3 11/04/2021   Stable, pt to continue current medical treatment  - diet, wt control   Hyperlipidemia Lab Results  Component Value Date   LDLCALC 97 11/04/2021   Mild uncontrolled, goal ldl < 70, pt to continue low chol diet, declines  statin   Generalized anxiety disorder Stable overall, continue xanax prn  Essential hypertension BP Readings from Last 3 Encounters:  11/04/21 (!) 170/92  06/14/21 132/72  06/12/21 129/83   Uncontrolled here, pt states stable at home, pt to continue medical treatment losartan 50 mg qd   Chronic low back pain Stable, for med refill prn  CKD (chronic kidney disease) stage 3, GFR 30-59 ml/min ([facility]) Lab Results  Component Value Date   CREATININE 1.23 (H) 11/04/2021   Stable overall, cont to avoid nephrotoxins   Nocturnal foot cramps Also for flexeril 5 qhs prn,  to f/u any worsening symptoms or concerns  Followup: Return in about 6 months (around 05/06/2022).  Cathlean Cower, MD 11/04/2021 8:09 PM [facility]

## 2021-11-04 NOTE — Assessment & Plan Note (Signed)
Stable overall, continue xanax prn

## 2021-11-04 NOTE — Patient Instructions (Signed)
Please look into having the Shingles shots done at your local pharmacy if ok with the insurance  Please take all new medication as prescribed - the muscle relaxer at bedtime  Please continue all other medications as before, and refills have been done if requested.  Please have the pharmacy call with any other refills you may need.  Please continue your efforts at being more active, low cholesterol diet, and weight control.  You are otherwise up to date with prevention measures today.  Please keep your appointments with your specialists as you may have planned  Please go to the LAB at the blood drawing area for the tests to be done  You will be contacted by phone if any changes need to be made immediately.  Otherwise, you will receive a letter about your results with an explanation, but please check with MyChart first.  Please remember to sign up for MyChart if you have not done so, as this will be important to you in the future with finding out test results, communicating by private email, and scheduling acute appointments online when needed.  Please make an Appointment to return in 6 months, or sooner if needed

## 2021-11-04 NOTE — Assessment & Plan Note (Signed)
BP Readings from Last 3 Encounters:  11/04/21 (!) 170/92  06/14/21 132/72  06/12/21 129/83   Uncontrolled here, pt states stable at home, pt to continue medical treatment losartan 50 mg qd

## 2021-11-04 NOTE — Assessment & Plan Note (Signed)
Age and sex appropriate education and counseling updated with regular exercise and diet Referrals for preventative services - none needed Immunizations addressed - declines shingrix, tdap, covid booster Smoking counseling  - none needed Evidence for depression or other mood disorder - chronic anxiety stable Most recent labs reviewed. I have personally reviewed and have noted: 1) the patient's medical and social history 2) The patient's current medications and supplements 3) The patient's height, weight, and BMI have been recorded in the chart

## 2021-11-04 NOTE — Assessment & Plan Note (Signed)
Lab Results  Component Value Date   LDLCALC 97 11/04/2021   Mild uncontrolled, goal ldl < 70, pt to continue low chol diet, declines statin

## 2021-11-04 NOTE — Assessment & Plan Note (Signed)
Lab Results  Component Value Date   CREATININE 1.23 (H) 11/04/2021   Stable overall, cont to avoid nephrotoxins

## 2021-11-04 NOTE — Assessment & Plan Note (Signed)
Stable, for med refill prn

## 2021-11-04 NOTE — Assessment & Plan Note (Signed)
Lab Results  Component Value Date   HGBA1C 6.3 11/04/2021   Stable, pt to continue current medical treatment  - diet, wt control

## 2021-11-05 LAB — URINALYSIS, ROUTINE W REFLEX MICROSCOPIC
Bilirubin Urine: NEGATIVE
Ketones, ur: NEGATIVE
Nitrite: NEGATIVE
Specific Gravity, Urine: <=1.005 — AB (ref 1.000–1.030)
Total Protein, Urine: NEGATIVE
Urine Glucose: NEGATIVE
Urobilinogen, UA: 0.2 (ref 0.0–1.0)
pH: 6 (ref 5.0–8.0)

## 2022-05-02 ENCOUNTER — Ambulatory Visit (INDEPENDENT_AMBULATORY_CARE_PROVIDER_SITE_OTHER): Payer: Medicare Other

## 2022-05-02 VITALS — Ht 60.0 in | Wt 93.0 lb

## 2022-05-02 DIAGNOSIS — Z0000 Encounter for general adult medical examination without abnormal findings: Secondary | ICD-10-CM

## 2022-05-02 NOTE — Progress Notes (Signed)
I connected with Tammy Wolfe today by telephone and verified that I am speaking with the correct person using two identifiers. Location patient: home Location provider: work Persons participating in the virtual visit: Brina, Umeda LPN.   I discussed the limitations, risks, security and privacy concerns of performing an evaluation and management service by telephone and the availability of in person appointments. I also discussed with the patient that there may be a patient responsible charge related to this service. The patient expressed understanding and verbally consented to this telephonic visit.    Interactive audio and video telecommunications were attempted between this provider and patient, however failed, due to patient having technical difficulties OR patient did not have access to video capability.  We continued and completed visit with audio only.     Vital signs may be patient reported or missing.  Subjective:   Tammy Wolfe is a 81 y.o. female who presents for Medicare Annual (Subsequent) preventive examination.  Review of Systems     Cardiac Risk Factors include: advanced age (>46mn, >>53women);dyslipidemia;hypertension     Objective:    Today's Vitals   05/02/22 0811  Weight: 93 lb (42.2 kg)  Height: 5' (1.524 m)   Body mass index is 18.16 kg/m.     05/02/2022    8:17 AM 06/12/2021   10:12 AM 05/01/2021    9:33 AM 04/05/2019    2:03 PM 03/30/2018    2:36 PM  Advanced Directives  Does Patient Have a Medical Advance Directive? Yes No Yes Yes Yes  Type of [place] will  Living will;Healthcare Power of [place] will [place] will  Does patient want to make changes to medical advance directive?   No - Patient declined    Copy of [place] Chart? No - copy requested  No - copy requested No - copy requested No - copy  requested  Would patient like information on creating a medical advance directive?  No - Patient declined       Current Medications (verified) Outpatient Encounter Medications as of 05/02/2022  Medication Sig   acetaminophen (TYLENOL) 325 MG tablet Take 650 mg by mouth every 6 (six) hours as needed.   ALPRAZolam (XANAX) 0.25 MG tablet TAKE ONE TABLET THREE TIMES DAILY AS NEEDED   Cholecalciferol (VITAMIN D) 1000 UNITS capsule Take 1,000 Units by mouth daily.     cyanocobalamin 1000 MCG tablet Take 1,000 mcg by mouth daily.   cyclobenzaprine (FLEXERIL) 5 MG tablet 1 tab by mouth at bedtime as needed for cramps and back pain   HYDROcodone-acetaminophen (NORCO/VICODIN) 5-325 MG tablet Take 1 tablet by mouth 2 (two) times daily as needed.   losartan (COZAAR) 50 MG tablet Take 1 tablet (50 mg total) by mouth daily.   losartan (COZAAR) 50 MG tablet Take 1 tablet by mouth daily.   trimethoprim (TRIMPEX) 100 MG tablet Take 1 tablet (100 mg total) by mouth daily.   oxymetazoline (AFRIN) 0.05 % nasal spray Place 1 spray into both nostrils daily as needed for congestion. (Patient not taking: Reported on 05/02/2022)   sulfamethoxazole-trimethoprim (BACTRIM) 400-80 MG tablet Take 1 tablet by mouth daily. (Patient not taking: Reported on 05/02/2022)   No facility-administered encounter medications on file as of 05/02/2022.    Allergies (verified) Tetracycline, Lovastatin, and Pravastatin   History: Past Medical History:  Diagnosis Date   ABNORMAL THYROID FUNCTION TESTS 081/27/5170  ALCOHOLIC HEPATITIS, HX  OF 02/14/2007   ALLERGIC RHINITIS 02/14/2007   Anemia, unspecified 04/26/2012   ANXIETY 03/19/2007   Breast cancer ([place])    Breast implant removal status 12/23/2008   COLONIC POLYPS, HX OF 02/14/2007   COPD (chronic obstructive pulmonary disease) ([place])    Coronary artery calcification seen on CT scan 10/31/2013   GOITER, MULTINODULAR 02/14/2007   HX, PERSONAL, ALCOHOLISM 02/14/2007    HYPERLIPIDEMIA 02/14/2007   HYPERTENSION 02/14/2007   Insomnia    OSTEOARTHRITIS 02/14/2007   OSTEOPOROSIS 02/14/2007   PANCREATITIS, HX OF 02/14/2007   Smoker 09/06/2013   Past Surgical History:  Procedure Laterality Date   BREAST SURGERY  6767   silicone prepectoral implants explanted by Dr. Towanda Malkin after rupture   BREAST SURGERY     left simple mastectomy and left axillary sentinel node biopsy, right prophylactic mastectomy   CATARACT EXTRACTION     NASAL SINUS SURGERY Left 06/14/2021   Procedure: ENDOSCOPIC MAXILLARY ANTROSTOMY WITH REMOVAL OF TISSUE;  Surgeon: Izora Gala, MD;  Location: [facility];  Service: ENT;  Laterality: Left;   peridontal  2010   s/p jaw and periodontal surgury  2009   Family History  Adopted: Yes  Problem Relation Age of Onset   Hyperlipidemia Brother    Social History   Socioeconomic History   Marital status: Divorced    Spouse name: Not on file   Number of children: 3   Years of education: Not on file   Highest education level: Not on file  Occupational History   Occupation: retired Psychologist, sport and exercise  Tobacco Use   Smoking status: Former    Packs/day: 0.50    Types: Cigarettes    Quit date: 01/24/2021    Years since quitting: 1.2   Smokeless tobacco: Never   Tobacco comments:    smoking about 4 cigs/day (as of 04/10/16)-gwd  Vaping Use   Vaping Use: Never used  Substance and Sexual Activity   Alcohol use: Not Currently    Alcohol/week: 0.0 standard drinks of alcohol    Comment: one glass/wine occasional/ no drinking now   Drug use: No   Sexual activity: Not Currently  Other Topics Concern   Not on file  Social History Narrative   Not on file   Social Determinants of Health   Financial Resource Strain: Low Risk  (05/02/2022)   Overall Financial Resource Strain (CARDIA)    Difficulty of Paying Living Expenses: Not hard at all  Food Insecurity: No Food Insecurity (05/02/2022)   Hunger Vital Sign    Worried About Running Out of Food in the  Last Year: Never true    [place] in the Last Year: Never true  Transportation Needs: No Transportation Needs (05/02/2022)   PRAPARE - Hydrologist (Medical): No    Lack of Transportation (Non-Medical): No  Physical Activity: Sufficiently Active (05/02/2022)   Exercise Vital Sign    Days of Exercise per Week: 7 days    Minutes of Exercise per Session: 30 min  Stress: No Stress Concern Present (05/02/2022)   [place]    Feeling of Stress : Not at all  Social Connections: Moderately Integrated (05/01/2021)   Social Connection and Isolation Panel [NHANES]    Frequency of Communication with Friends and Family: More than three times a week    Frequency of Social Gatherings with Friends and Family: More than three times a week    Attends Religious Services: 1 to 4  times per year    Active Member of Clubs or Organizations: Yes    Attends Archivist Meetings: 1 to 4 times per year    Marital Status: Divorced    Tobacco Counseling Counseling given: Not Answered Tobacco comments: smoking about 4 cigs/day (as of 04/10/16)-gwd   Clinical Intake:  Pre-visit preparation completed: Yes  Pain : No/denies pain     Nutritional Status: BMI <19  Underweight Nutritional Risks: None Diabetes: No  How often do you need to have someone help you when you read instructions, pamphlets, or other written materials from your doctor or pharmacy?: 1 - Never  Diabetic? no  Interpreter Needed?: No  Information entered by :: NAllen LPN   Activities of Daily Living    05/02/2022    8:18 AM 06/12/2021   10:15 AM  In your present state of health, do you have any difficulty performing the following activities:  Hearing? 0   Vision? 0   Difficulty concentrating or making decisions? 0   Walking or climbing stairs? 0   Dressing or bathing? 0   Doing errands, shopping? 0 0  Preparing Food and  eating ? N   Using the Toilet? N   In the past six months, have you accidently leaked urine? N   Do you have problems with loss of bowel control? N   Managing your Medications? N   Managing your Finances? N   Housekeeping or managing your Housekeeping? N     Patient Care Team: Biagio Borg, MD as PCP - General Bjorn Loser, MD as Consulting Physician (Urology) Luberta Mutter, MD as Consulting Physician (Ophthalmology) Magrinat, Virgie Dad, MD (Inactive) as Consulting Physician (Oncology)  Indicate any recent Medical Services you may have received from other than [facility] providers in the past year (date may be approximate).     Assessment:   This is a routine wellness examination for Asiya.  Hearing/Vision screen Vision Screening - Comments:: Regular eye exams, [facility], Dr. Ellie Lunch  Dietary issues and exercise activities discussed: Current Exercise Habits: Home exercise routine, Type of exercise: walking, Time (Minutes): 30, Frequency (Times/Week): 7, Weekly Exercise (Minutes/Week): 210   Goals Addressed             This Visit's Progress    Patient Stated       05/02/2022, no goals       Depression Screen    05/02/2022    8:18 AM 05/01/2021    9:19 AM 04/25/2020    2:39 PM 04/25/2020    2:12 PM 04/05/2019    2:04 PM 03/30/2018    1:56 PM 03/30/2018    1:18 PM  PHQ 2/9 Scores  PHQ - 2 Score 0 0 0 0 1 0 0  PHQ- 9 Score     2      Fall Risk    05/02/2022    8:18 AM 05/01/2021    9:20 AM 04/25/2020    2:39 PM 04/25/2020    2:12 PM 04/05/2019    1:16 PM  Fall Risk   Falls in the past year? 0 0 0 0 1  Number falls in past yr: 0 0  0 0  Comment     fell up the stairs with 2 black eyes and sore still all over  Injury with Fall? 0 0  0 0  Risk for fall due to : Medication side effect No Fall Risks  No Fall Risks   Follow up Falls prevention discussed;Education provided;Falls evaluation  completed Falls prevention discussed  Falls evaluation completed      FALL RISK PREVENTION PERTAINING TO THE HOME:  Any stairs in or around the home? Yes  If so, are there any without handrails? No  Home free of loose throw rugs in walkways, pet beds, electrical cords, etc? Yes  Adequate lighting in your home to reduce risk of falls? Yes   ASSISTIVE DEVICES UTILIZED TO PREVENT FALLS:  Life alert? No  Use of a cane, walker or w/c? No  Grab bars in the bathroom? Yes  Shower chair or bench in shower? Yes  Elevated toilet seat or a handicapped toilet? Yes   TIMED UP AND GO:  Was the test performed? No .      Cognitive Function:    03/30/2018    2:37 PM  MMSE - Mini Mental State Exam  Orientation to time 5  Orientation to Place 5  Registration 3  Attention/ Calculation 5  Recall 2  Language- name 2 objects 2  Language- repeat 1  Language- follow 3 step command 3  Language- read & follow direction 1  Write a sentence 1  Copy design 1  Total score 29        05/02/2022    8:19 AM  6CIT Screen  What Year? 0 points  What month? 0 points  What time? 0 points  Count back from 20 0 points  Months in reverse 0 points  Repeat phrase 0 points  Total Score 0 points    Immunizations Immunization History  Administered Date(s) Administered   Fluad Quad(high Dose 65+) 04/05/2019, 04/25/2020, 05/01/2021   Influenza Whole 03/19/2007   Influenza, High Dose Seasonal PF 03/08/2013, 03/07/2014, 03/14/2015, 03/26/2017, 03/30/2018   Influenza,inj,Quad PF,6+ Mos 03/12/2016   PFIZER(Purple Top)SARS-COV-2 Vaccination 07/24/2019, 08/17/2019, 03/19/2020   Pneumococcal Conjugate-13 03/29/2013   Pneumococcal Polysaccharide-23 01/21/2006   Td 11/12/2009   Zoster, Live 01/21/2006    TDAP status: Due, Education has been provided regarding the importance of this vaccine. Advised may receive this vaccine at local pharmacy or Health Dept. Aware to provide a copy of the vaccination record if obtained from local pharmacy or Health Dept. Verbalized  acceptance and understanding.  Flu Vaccine status: Due, Education has been provided regarding the importance of this vaccine. Advised may receive this vaccine at local pharmacy or Health Dept. Aware to provide a copy of the vaccination record if obtained from local pharmacy or Health Dept. Verbalized acceptance and understanding.  Pneumococcal vaccine status: Up to date  Covid-19 vaccine status: Completed vaccines  Qualifies for Shingles Vaccine? Yes   Zostavax completed Yes   Shingrix Completed?: No.    Education has been provided regarding the importance of this vaccine. Patient has been advised to call insurance company to determine out of pocket expense if they have not yet received this vaccine. Advised may also receive vaccine at local pharmacy or Health Dept. Verbalized acceptance and understanding.  Screening Tests Health Maintenance  Topic Date Due   Zoster Vaccines- Shingrix (1 of 2) Never done   DTaP/Tdap/Td (2 - Tdap) 11/13/2019   INFLUENZA VACCINE  12/24/2021   COVID-19 Vaccine (4 - 2023-24 season) 01/24/2022   Medicare Annual Wellness (AWV)  05/01/2022   Pneumonia Vaccine 79+ Years old  Completed   DEXA SCAN  Completed   HPV VACCINES  Aged Out    Health Maintenance  Health Maintenance Due  Topic Date Due   Zoster Vaccines- Shingrix (1 of 2) Never done   DTaP/Tdap/Td (2 -  Tdap) 11/13/2019   INFLUENZA VACCINE  12/24/2021   COVID-19 Vaccine (4 - 2023-24 season) 01/24/2022   Medicare Annual Wellness (AWV)  05/01/2022    Colorectal cancer screening: No longer required.   Mammogram status: No longer required due to age.  Bone Density status: Completed 03/04/2011.   Lung Cancer Screening: (Low Dose CT Chest recommended if Age 81-80 years, 30 pack-year currently smoking OR have quit w/in 15years.) does not qualify.   Lung Cancer Screening Referral: no  Additional Screening:  Hepatitis C Screening: does not qualify;   Vision Screening: Recommended annual  ophthalmology exams for early detection of glaucoma and other disorders of the eye. Is the patient up to date with their annual eye exam?  Yes  Who is the provider or what is the name of the office in which the patient attends annual eye exams? Dr. Ellie Lunch If pt is not established with a provider, would they like to be referred to a provider to establish care? No .   Dental Screening: Recommended annual dental exams for proper oral hygiene  Community Resource Referral / Chronic Care Management: CRR required this visit?  No   CCM required this visit?  No      Plan:     I have personally reviewed and noted the following in the patient's chart:   Medical and social history Use of alcohol, tobacco or illicit drugs  Current medications and supplements including opioid prescriptions. Patient is currently taking opioid prescriptions. Information provided to patient regarding non-opioid alternatives. Patient advised to discuss non-opioid treatment plan with their provider. Functional ability and status Nutritional status Physical activity Advanced directives List of other physicians Hospitalizations, surgeries, and ER visits in previous 12 months Vitals Screenings to include cognitive, depression, and falls Referrals and appointments  In addition, I have reviewed and discussed with patient certain preventive protocols, quality metrics, and best practice recommendations. A written personalized care plan for preventive services as well as general preventive health recommendations were provided to patient.     Kellie Simmering, LPN   [id number]   Nurse Notes: none  Due to this being a virtual visit, the after visit summary with patients personalized plan was offered to patient via mail or my-chart.  to pick up at office at next visit

## 2022-05-02 NOTE — Patient Instructions (Signed)
Tammy Wolfe , Thank you for taking time to come for your Medicare Wellness Visit. I appreciate your ongoing commitment to your health goals. Please review the following plan we discussed and let me know if I can assist you in the future.   These are the goals we discussed:  Goals       patient (pt-stated)      Wants to be healthy as she can be      Patient Stated      Continue to be as healthy and as independent as possible. Love family and enjoy life.        This is a list of the screening recommended for you and due dates:  Health Maintenance  Topic Date Due   Zoster (Shingles) Vaccine (1 of 2) Never done   DTaP/Tdap/Td vaccine (2 - Tdap) 11/13/2019   Flu Shot  12/24/2021   COVID-19 Vaccine (4 - 2023-24 season) 01/24/2022   Medicare Annual Wellness Visit  05/01/2022   Pneumonia Vaccine  Completed   DEXA scan (bone density measurement)  Completed   HPV Vaccine  Aged Out    Advanced directives: Please bring a copy of your POA (Power of Aurelia) and/or Living Will to your next appointment.   Conditions/risks identified: none  Next appointment: Follow up in one year for your annual wellness visit    Preventive Care 65 Years and Older, Female Preventive care refers to lifestyle choices and visits with your health care provider that can promote health and wellness. What does preventive care include? A yearly physical exam. This is also called an annual well check. Dental exams once or twice a year. Routine eye exams. Ask your health care provider how often you should have your eyes checked. Personal lifestyle choices, including: Daily care of your teeth and gums. Regular physical activity. Eating a healthy diet. Avoiding tobacco and drug use. Limiting alcohol use. Practicing safe sex. Taking low-dose aspirin every day. Taking vitamin and mineral supplements as recommended by your health care provider. What happens during an annual well check? The services and screenings  done by your health care provider during your annual well check will depend on your age, overall health, lifestyle risk factors, and family history of disease. Counseling  Your health care provider may ask you questions about your: Alcohol use. Tobacco use. Drug use. Emotional well-being. Home and relationship well-being. Sexual activity. Eating habits. History of falls. Memory and ability to understand (cognition). Work and work Statistician. Reproductive health. Screening  You may have the following tests or measurements: Height, weight, and BMI. Blood pressure. Lipid and cholesterol levels. These may be checked every 5 years, or more frequently if you are over 83 years old. Skin check. Lung cancer screening. You may have this screening every year starting at age 42 if you have a 30-pack-year history of smoking and currently smoke or have quit within the past 15 years. Fecal occult blood test (FOBT) of the stool. You may have this test every year starting at age 43. Flexible sigmoidoscopy or colonoscopy. You may have a sigmoidoscopy every 5 years or a colonoscopy every 10 years starting at age 42. Hepatitis C blood test. Hepatitis B blood test. Sexually transmitted disease (STD) testing. Diabetes screening. This is done by checking your blood sugar (glucose) after you have not eaten for a while (fasting). You may have this done every 1-3 years. Bone density scan. This is done to screen for osteoporosis. You may have this done starting at age 35.  Mammogram. This may be done every 1-2 years. Talk to your health care provider about how often you should have regular mammograms. Talk with your health care provider about your test results, treatment options, and if necessary, the need for more tests. Vaccines  Your health care provider may recommend certain vaccines, such as: Influenza vaccine. This is recommended every year. Tetanus, diphtheria, and acellular pertussis (Tdap, Td)  vaccine. You may need a Td booster every 10 years. Zoster vaccine. You may need this after age 55. Pneumococcal 13-valent conjugate (PCV13) vaccine. One dose is recommended after age 45. Pneumococcal polysaccharide (PPSV23) vaccine. One dose is recommended after age 29. Talk to your health care provider about which screenings and vaccines you need and how often you need them. This information is not intended to replace advice given to you by your health care provider. Make sure you discuss any questions you have with your health care provider. Document Released: 06/08/2015 Document Revised: 01/30/2016 Document Reviewed: 03/13/2015 Elsevier Interactive Patient Education  2017 [employer] Prevention in the Home Falls can cause injuries. They can happen to people of all ages. There are many things you can do to make your home safe and to help prevent falls. What can I do on the outside of my home? Regularly fix the edges of walkways and driveways and fix any cracks. Remove anything that might make you trip as you walk through a door, such as a raised step or threshold. Trim any bushes or trees on the path to your home. Use bright outdoor lighting. Clear any walking paths of anything that might make someone trip, such as rocks or tools. Regularly check to see if handrails are loose or broken. Make sure that both sides of any steps have handrails. Any raised decks and porches should have guardrails on the edges. Have any leaves, snow, or ice cleared regularly. Use sand or salt on walking paths during winter. Clean up any spills in your garage right away. This includes oil or grease spills. What can I do in the bathroom? Use night lights. Install grab bars by the toilet and in the tub and shower. Do not use towel bars as grab bars. Use non-skid mats or decals in the tub or shower. If you need to sit down in the shower, use a plastic, non-slip stool. Keep the floor dry. Clean up any  water that spills on the floor as soon as it happens. Remove soap buildup in the tub or shower regularly. Attach bath mats securely with double-sided non-slip rug tape. Do not have throw rugs and other things on the floor that can make you trip. What can I do in the bedroom? Use night lights. Make sure that you have a light by your bed that is easy to reach. Do not use any sheets or blankets that are too big for your bed. They should not hang down onto the floor. Have a firm chair that has side arms. You can use this for support while you get dressed. Do not have throw rugs and other things on the floor that can make you trip. What can I do in the kitchen? Clean up any spills right away. Avoid walking on wet floors. Keep items that you use a lot in easy-to-reach places. If you need to reach something above you, use a strong step stool that has a grab bar. Keep electrical cords out of the way. Do not use floor polish or wax that makes floors slippery. If  you must use wax, use non-skid floor wax. Do not have throw rugs and other things on the floor that can make you trip. What can I do with my stairs? Do not leave any items on the stairs. Make sure that there are handrails on both sides of the stairs and use them. Fix handrails that are broken or loose. Make sure that handrails are as long as the stairways. Check any carpeting to make sure that it is firmly attached to the stairs. Fix any carpet that is loose or worn. Avoid having throw rugs at the top or bottom of the stairs. If you do have throw rugs, attach them to the floor with carpet tape. Make sure that you have a light switch at the top of the stairs and the bottom of the stairs. If you do not have them, ask someone to add them for you. What else can I do to help prevent falls? Wear shoes that: Do not have high heels. Have rubber bottoms. Are comfortable and fit you well. Are closed at the toe. Do not wear sandals. If you use a  stepladder: Make sure that it is fully opened. Do not climb a closed stepladder. Make sure that both sides of the stepladder are locked into place. Ask someone to hold it for you, if possible. Clearly mark and make sure that you can see: Any grab bars or handrails. First and last steps. Where the edge of each step is. Use tools that help you move around (mobility aids) if they are needed. These include: Canes. Walkers. Scooters. Crutches. Turn on the lights when you go into a dark area. Replace any light bulbs as soon as they burn out. Set up your furniture so you have a clear path. Avoid moving your furniture around. If any of your floors are uneven, fix them. If there are any pets around you, be aware of where they are. Review your medicines with your doctor. Some medicines can make you feel dizzy. This can increase your chance of falling. Ask your doctor what other things that you can do to help prevent falls. This information is not intended to replace advice given to you by your health care provider. Make sure you discuss any questions you have with your health care provider. Document Released: 03/08/2009 Document Revised: 10/18/2015 Document Reviewed: 06/16/2014 Elsevier Interactive Patient Education  2017 [employer].

## 2022-05-06 ENCOUNTER — Ambulatory Visit (INDEPENDENT_AMBULATORY_CARE_PROVIDER_SITE_OTHER): Payer: Medicare Other | Admitting: Internal Medicine

## 2022-05-06 VITALS — BP 178/96 | HR 118 | Temp 97.7°F | Ht 60.0 in | Wt 96.0 lb

## 2022-05-06 DIAGNOSIS — F419 Anxiety disorder, unspecified: Secondary | ICD-10-CM

## 2022-05-06 DIAGNOSIS — Z23 Encounter for immunization: Secondary | ICD-10-CM

## 2022-05-06 DIAGNOSIS — M545 Low back pain, unspecified: Secondary | ICD-10-CM

## 2022-05-06 DIAGNOSIS — G8929 Other chronic pain: Secondary | ICD-10-CM

## 2022-05-06 DIAGNOSIS — G603 Idiopathic progressive neuropathy: Secondary | ICD-10-CM

## 2022-05-06 DIAGNOSIS — I10 Essential (primary) hypertension: Secondary | ICD-10-CM

## 2022-05-06 MED ORDER — CYCLOBENZAPRINE HCL 5 MG PO TABS: ORAL_TABLET | ORAL | 3 refills | Status: DC

## 2022-05-06 MED ORDER — DULOXETINE HCL 30 MG PO CPEP: 30.0000 mg | ORAL_CAPSULE | Freq: Every day | ORAL | 3 refills | Status: DC

## 2022-05-06 MED ORDER — HYDROCODONE-ACETAMINOPHEN 5-325 MG PO TABS: 1.0000 | ORAL_TABLET | Freq: Two times a day (BID) | ORAL | 0 refills | Status: DC | PRN

## 2022-05-06 MED ORDER — LOSARTAN POTASSIUM 100 MG PO TABS: 100.0000 mg | ORAL_TABLET | Freq: Every day | ORAL | 3 refills | Status: DC

## 2022-05-06 MED ORDER — ALPRAZOLAM 0.25 MG PO TABS: ORAL_TABLET | ORAL | 5 refills | Status: DC

## 2022-05-06 NOTE — Progress Notes (Unsigned)
Patient ID: Tammy Wolfe, female   DOB: Dec 10, 1940, 81 y.o.   MRN: [id number]        Chief Complaint: follow up worsening neuropathy to legs, htn, anxiety, chronic pain       HPI:  Tammy Wolfe is a 81 y.o. female here overall doing ok, but c/o 3 mo worsening glove like numbness seeming to progress from feet to mid legs with increased discomfort mild constant. Recent TSH and B12 are normal.  BP is normal at home per pt, Denies worsening depressive symptoms, suicidal ideation, or panic; has ongoing anxiety, Asks for limited refill vicodin for very prn use for overall pain.  Due for flu shot.  Pt denies chest pain, increased sob or doe, wheezing, orthopnea, PND, increased LE swelling, palpitations, dizziness or syncope.   Pt denies polydipsia, polyuria, or new focal neuro s/s.    Pt denies fever, wt loss, night sweats, loss of appetite, or other constitutional symptoms Due for flu shot       Wt Readings from Last 3 Encounters:  05/06/22 96 lb (43.5 kg)  05/02/22 93 lb (42.2 kg)  11/04/21 89 lb (40.4 kg)   BP Readings from Last 3 Encounters:  05/06/22 (!) 178/96  11/04/21 (!) 170/92  06/14/21 132/72         Past Medical History:  Diagnosis Date   ABNORMAL THYROID FUNCTION TESTS 40/02/2724   ALCOHOLIC HEPATITIS, HX OF 02/14/2007   ALLERGIC RHINITIS 02/14/2007   Anemia, unspecified 04/26/2012   ANXIETY 03/19/2007   Breast cancer ([place])    Breast implant removal status 12/23/2008   COLONIC POLYPS, HX OF 02/14/2007   COPD (chronic obstructive pulmonary disease) ([place])    Coronary artery calcification seen on CT scan 10/31/2013   GOITER, MULTINODULAR 02/14/2007   HX, PERSONAL, ALCOHOLISM 02/14/2007   HYPERLIPIDEMIA 02/14/2007   HYPERTENSION 02/14/2007   Insomnia    OSTEOARTHRITIS 02/14/2007   OSTEOPOROSIS 02/14/2007   PANCREATITIS, HX OF 02/14/2007   Smoker 09/06/2013   Past Surgical History:  Procedure Laterality Date   BREAST SURGERY  3664   silicone prepectoral implants explanted by  Dr. Towanda Malkin after rupture   BREAST SURGERY     left simple mastectomy and left axillary sentinel node biopsy, right prophylactic mastectomy   CATARACT EXTRACTION     NASAL SINUS SURGERY Left 06/14/2021   Procedure: ENDOSCOPIC MAXILLARY ANTROSTOMY WITH REMOVAL OF TISSUE;  Surgeon: Izora Gala, MD;  Location: [facility] OR;  Service: ENT;  Laterality: Left;   peridontal  2010   s/p jaw and periodontal surgury  2009    reports that she quit smoking about 15 months ago. Her smoking use included cigarettes. She smoked an average of .5 packs per day. She has never used smokeless tobacco. She reports that she does not currently use alcohol. She reports that she does not use drugs. family history includes Hyperlipidemia in her brother. She was adopted. Allergies  Allergen Reactions   Tetracycline Anaphylaxis   Lovastatin Other (See Comments)    Muscle cramps   Pravastatin Other (See Comments)    Leg cramps   Current Outpatient Medications on File Prior to Visit  Medication Sig Dispense Refill   acetaminophen (TYLENOL) 325 MG tablet Take 650 mg by mouth every 6 (six) hours as needed.     Cholecalciferol (VITAMIN D) 1000 UNITS capsule Take 1,000 Units by mouth daily.       cyanocobalamin 1000 MCG tablet Take 1,000 mcg by mouth daily.     trimethoprim (TRIMPEX) 100 MG tablet  Take 1 tablet (100 mg total) by mouth daily. 90 tablet 3   oxymetazoline (AFRIN) 0.05 % nasal spray Place 1 spray into both nostrils daily as needed for congestion. (Patient not taking: Reported on 05/02/2022)     sulfamethoxazole-trimethoprim (BACTRIM) 400-80 MG tablet Take 1 tablet by mouth daily. (Patient not taking: Reported on 05/02/2022)     No current facility-administered medications on file prior to visit.        ROS:  All others reviewed and negative.  Objective        PE:  BP (!) 178/96 (BP Location: Left Arm, Patient Position: Sitting, Cuff Size: Large)   Pulse (!) 118   Temp 97.7 F (36.5 C) (Oral)   Ht 5' (1.524  m)   Wt 96 lb (43.5 kg)   SpO2 99%   BMI 18.75 kg/m                 Constitutional: Pt appears in NAD               HENT: Head: NCAT.                Right Ear: External ear normal.                 Left Ear: External ear normal.                Eyes: . Pupils are equal, round, and reactive to light. Conjunctivae and EOM are normal               Nose: without d/c or deformity               Neck: Neck supple. Gross normal ROM               Cardiovascular: Normal rate and regular rhythm.                 Pulmonary/Chest: Effort normal and breath sounds without rales or wheezing.                Abd:  Soft, NT, ND, + BS, no organomegaly               Neurological: Pt is alert. At baseline orientation, motor grossly intact, has decreased sens to LT to bilateral mid egs                Skin: Skin is warm. No rashes, no other new lesions, LE edema - none               Psychiatric: Pt behavior is normal without agitation   Micro: none  Cardiac tracings I have personally interpreted today:  none  Pertinent Radiological findings (summarize): none   Lab Results  Component Value Date   WBC 8.5 11/04/2021   HGB 10.8 (L) 11/04/2021   HCT 33.5 (L) 11/04/2021   PLT 363.0 11/04/2021   GLUCOSE 150 (H) 11/04/2021   CHOL 188 11/04/2021   TRIG 170.0 (H) 11/04/2021   HDL 57.00 11/04/2021   LDLCALC 97 11/04/2021   ALT 14 11/04/2021   AST 18 11/04/2021   NA 134 (L) 11/04/2021   K 4.1 11/04/2021   CL 100 11/04/2021   CREATININE 1.23 (H) 11/04/2021   BUN 24 (H) 11/04/2021   CO2 22 11/04/2021   TSH 0.64 11/04/2021   HGBA1C 6.3 11/04/2021   Assessment/Plan:  Tammy Wolfe is a 81 y.o. White or Caucasian [1] female with  has a past medical history of ABNORMAL  THYROID FUNCTION TESTS (18/34/3735), ALCOHOLIC HEPATITIS, HX OF (02/14/2007), ALLERGIC RHINITIS (02/14/2007), Anemia, unspecified (04/26/2012), ANXIETY (03/19/2007), Breast cancer ([place]), Breast implant removal status (12/23/2008), COLONIC POLYPS,  HX OF (02/14/2007), COPD (chronic obstructive pulmonary disease) ([place]), Coronary artery calcification seen on CT scan (10/31/2013), GOITER, MULTINODULAR (02/14/2007), HX, PERSONAL, ALCOHOLISM (02/14/2007), HYPERLIPIDEMIA (02/14/2007), HYPERTENSION (02/14/2007), Insomnia, OSTEOARTHRITIS (02/14/2007), OSTEOPOROSIS (02/14/2007), PANCREATITIS, HX OF (02/14/2007), and Smoker (09/06/2013).  Peripheral neuropathy With increased symptoms, for start cymbalta 30 mg qd,  to f/u any worsening symptoms or concerns  Essential hypertension BP Readings from Last 3 Encounters:  05/06/22 (!) 178/96  11/04/21 (!) 170/92  06/14/21 132/72   Severe uncontrolled here, but states BP borderline at home, ok to increase losartan 100 mg qd  Chronic low back pain Overall stable, for refill limited vicodin prn  Anxiety Ok for refill xanax prn, hopefully cymbalta to work as well  Followup: Return in about 6 months (around 11/05/2022).  Cathlean Cower, MD 05/07/2022 12:55 PM [facility]

## 2022-05-06 NOTE — Patient Instructions (Signed)
You had the flu shot today  Ok to increase the losartan 100 mg  Please take all new medication as prescribed - the cymbalta 30 mg per day ( and call in 1 month for the 60 mg if you feel you need this)  Please continue all other medications as before, and refills have been done if requested - the xanax, vicodin, and flexeril  Please have the pharmacy call with any other refills you may need.  Please continue your efforts at being more active, low cholesterol diet, and weight control.  Please keep your appointments with your specialists as you may have planned  Please make an Appointment to return in 6 months, or sooner if needed

## 2022-05-07 ENCOUNTER — Encounter: Payer: Self-pay | Admitting: Internal Medicine

## 2022-05-07 DIAGNOSIS — F419 Anxiety disorder, unspecified: Secondary | ICD-10-CM | POA: Insufficient documentation

## 2022-05-07 DIAGNOSIS — G629 Polyneuropathy, unspecified: Secondary | ICD-10-CM | POA: Insufficient documentation

## 2022-05-07 NOTE — Assessment & Plan Note (Addendum)
BP Readings from Last 3 Encounters:  05/06/22 (!) 178/96  11/04/21 (!) 170/92  06/14/21 132/72   Severe uncontrolled here, but states BP borderline at home, ok to increase losartan 100 mg qd

## 2022-05-07 NOTE — Assessment & Plan Note (Signed)
[place] for refill xanax prn, hopefully cymbalta to work as well

## 2022-05-07 NOTE — Assessment & Plan Note (Signed)
Overall stable, for refill limited vicodin prn

## 2022-05-07 NOTE — Assessment & Plan Note (Signed)
With increased symptoms, for start cymbalta 30 mg qd,  to f/u any worsening symptoms or concerns

## 2022-06-02 ENCOUNTER — Telehealth: Payer: Self-pay

## 2022-06-02 NOTE — Telephone Encounter (Signed)
Pt PA is approved for hydrocodone (Key: [id number]) Effective from 05/27/2022 through 05/28/2023.

## 2022-06-04 DIAGNOSIS — Z961 Presence of intraocular lens: Secondary | ICD-10-CM | POA: Diagnosis not present

## 2022-06-04 DIAGNOSIS — H3500 Unspecified background retinopathy: Secondary | ICD-10-CM | POA: Diagnosis not present

## 2022-06-06 DIAGNOSIS — R350 Frequency of micturition: Secondary | ICD-10-CM | POA: Diagnosis not present

## 2022-11-03 ENCOUNTER — Other Ambulatory Visit: Payer: Self-pay | Admitting: Internal Medicine

## 2022-11-04 ENCOUNTER — Ambulatory Visit: Payer: Medicare Other | Admitting: Internal Medicine

## 2022-11-10 ENCOUNTER — Ambulatory Visit (INDEPENDENT_AMBULATORY_CARE_PROVIDER_SITE_OTHER): Payer: Medicare Other | Admitting: Internal Medicine

## 2022-11-10 ENCOUNTER — Encounter: Payer: Self-pay | Admitting: Internal Medicine

## 2022-11-10 VITALS — BP 120/72 | HR 117 | Temp 98.5°F | Ht 60.0 in | Wt 93.0 lb

## 2022-11-10 DIAGNOSIS — E559 Vitamin D deficiency, unspecified: Secondary | ICD-10-CM | POA: Diagnosis not present

## 2022-11-10 DIAGNOSIS — J4489 Other specified chronic obstructive pulmonary disease: Secondary | ICD-10-CM | POA: Diagnosis not present

## 2022-11-10 DIAGNOSIS — M778 Other enthesopathies, not elsewhere classified: Secondary | ICD-10-CM

## 2022-11-10 DIAGNOSIS — L989 Disorder of the skin and subcutaneous tissue, unspecified: Secondary | ICD-10-CM

## 2022-11-10 DIAGNOSIS — R7302 Impaired glucose tolerance (oral): Secondary | ICD-10-CM

## 2022-11-10 DIAGNOSIS — E7800 Pure hypercholesterolemia, unspecified: Secondary | ICD-10-CM

## 2022-11-10 DIAGNOSIS — N1831 Chronic kidney disease, stage 3a: Secondary | ICD-10-CM

## 2022-11-10 DIAGNOSIS — F419 Anxiety disorder, unspecified: Secondary | ICD-10-CM

## 2022-11-10 DIAGNOSIS — G8929 Other chronic pain: Secondary | ICD-10-CM

## 2022-11-10 DIAGNOSIS — Z0001 Encounter for general adult medical examination with abnormal findings: Secondary | ICD-10-CM

## 2022-11-10 DIAGNOSIS — F411 Generalized anxiety disorder: Secondary | ICD-10-CM

## 2022-11-10 DIAGNOSIS — M545 Low back pain, unspecified: Secondary | ICD-10-CM

## 2022-11-10 DIAGNOSIS — E538 Deficiency of other specified B group vitamins: Secondary | ICD-10-CM | POA: Insufficient documentation

## 2022-11-10 LAB — URINALYSIS, ROUTINE W REFLEX MICROSCOPIC
Bilirubin Urine: NEGATIVE
Ketones, ur: NEGATIVE
Nitrite: NEGATIVE
Specific Gravity, Urine: 1.01 (ref 1.000–1.030)
Total Protein, Urine: 30 — AB
Urine Glucose: NEGATIVE
Urobilinogen, UA: 0.2 (ref 0.0–1.0)
pH: 6 (ref 5.0–8.0)

## 2022-11-10 LAB — BASIC METABOLIC PANEL
BUN: 22 mg/dL (ref 6–23)
CO2: 21 mEq/L (ref 19–32)
Calcium: 9.2 mg/dL (ref 8.4–10.5)
Chloride: 107 mEq/L (ref 96–112)
Creatinine, Ser: 1.37 mg/dL — ABNORMAL HIGH (ref 0.40–1.20)
GFR: 36.01 mL/min — ABNORMAL LOW (ref >60.00)
Glucose, Bld: 97 mg/dL (ref 70–99)
Potassium: 4.3 mEq/L (ref 3.5–5.1)
Sodium: 135 mEq/L (ref 135–145)

## 2022-11-10 LAB — TSH: TSH: 0.54 u[IU]/mL (ref 0.35–5.50)

## 2022-11-10 LAB — LIPID PANEL
Cholesterol: 194 mg/dL (ref 0–200)
HDL: 50.9 mg/dL (ref >39.00)
LDL Cholesterol: 116 mg/dL — ABNORMAL HIGH (ref 0–99)
NonHDL: 143.26
Total CHOL/HDL Ratio: 4
Triglycerides: 138 mg/dL (ref 0.0–149.0)
VLDL: 27.6 mg/dL (ref 0.0–40.0)

## 2022-11-10 LAB — HEPATIC FUNCTION PANEL
ALT: 11 U/L (ref 0–35)
AST: 18 U/L (ref 0–37)
Albumin: 4 g/dL (ref 3.5–5.2)
Alkaline Phosphatase: 70 U/L (ref 39–117)
Bilirubin, Direct: 0.1 mg/dL (ref 0.0–0.3)
Total Bilirubin: 0.3 mg/dL (ref 0.2–1.2)
Total Protein: 7.5 g/dL (ref 6.0–8.3)

## 2022-11-10 LAB — CBC WITH DIFFERENTIAL/PLATELET
Basophils Absolute: 0.1 10*3/uL (ref 0.0–0.1)
Basophils Relative: 0.7 % (ref 0.0–3.0)
Eosinophils Absolute: 0.1 10*3/uL (ref 0.0–0.7)
Eosinophils Relative: 0.9 % (ref 0.0–5.0)
HCT: 37.1 % (ref 36.0–46.0)
Hemoglobin: 11.5 g/dL — ABNORMAL LOW (ref 12.0–15.0)
Lymphocytes Relative: 27.7 % (ref 12.0–46.0)
Lymphs Abs: 1.9 10*3/uL (ref 0.7–4.0)
MCHC: 30.8 g/dL (ref 30.0–36.0)
MCV: 82.7 fl (ref 78.0–100.0)
Monocytes Absolute: 0.3 10*3/uL (ref 0.1–1.0)
Monocytes Relative: 4.6 % (ref 3.0–12.0)
Neutro Abs: 4.6 10*3/uL (ref 1.4–7.7)
Neutrophils Relative %: 66.1 % (ref 43.0–77.0)
Platelets: 362 10*3/uL (ref 150.0–400.0)
RBC: 4.49 Mil/uL (ref 3.87–5.11)
RDW: 15.4 % (ref 11.5–15.5)
WBC: 6.9 10*3/uL (ref 4.0–10.5)

## 2022-11-10 LAB — VITAMIN B12: Vitamin B-12: 154 pg/mL — ABNORMAL LOW (ref 211–911)

## 2022-11-10 LAB — HEMOGLOBIN A1C: Hgb A1c MFr Bld: 6 % (ref 4.6–6.5)

## 2022-11-10 LAB — VITAMIN D 25 HYDROXY (VIT D DEFICIENCY, FRACTURES): VITD: 64.6 ng/mL (ref 30.00–100.00)

## 2022-11-10 MED ORDER — LOSARTAN POTASSIUM 100 MG PO TABS: 100.0000 mg | ORAL_TABLET | Freq: Every day | ORAL | 3 refills | Status: DC

## 2022-11-10 MED ORDER — HYDROCODONE-ACETAMINOPHEN 5-325 MG PO TABS: 1.0000 | ORAL_TABLET | Freq: Two times a day (BID) | ORAL | 0 refills | Status: DC | PRN

## 2022-11-10 MED ORDER — CYCLOBENZAPRINE HCL 5 MG PO TABS: ORAL_TABLET | ORAL | 3 refills | Status: DC

## 2022-11-10 NOTE — Patient Instructions (Addendum)
Please have your Shingrix (shingles) shots done at your local pharmacy such as [facility], and the Tdap tetanus shot as well  Please continue all other medications as before, and refills have been done if requested.  Please have the pharmacy call with any other refills you may need.  Please continue your efforts at being more active, low cholesterol diet, and weight control.  You are otherwise up to date with prevention measures today.  Please keep your appointments with your specialists as you may have planned  Please call if you need the referral to Hand Surgury Dr Arcelia Jew will be contacted regarding the referral for: Dermatology Please go to the LAB at the blood drawing area for the tests to be done  You will be contacted by phone if any changes need to be made immediately.  Otherwise, you will receive a letter about your results with an explanation, but please check with MyChart first.  Please remember to sign up for MyChart if you have not done so, as this will be important to you in the future with finding out test results, communicating by private email, and scheduling acute appointments online when needed.  Please make an Appointment to return in 6 months, or sooner if needed

## 2022-11-10 NOTE — Progress Notes (Unsigned)
Patient ID: Tammy Wolfe, female   DOB: 28-Jun-1940, 82 y.o.   MRN: [id number]         Chief Complaint:: wellness exam and copd, hld, hyperglycemia, anxiety, chronic lbp, right upper chest skin lesion, right third finger dupytrens, [id number]       HPI:  Tammy Wolfe is a 82 y.o. female here for wellness exam; for shingrix and tdap at pharmacy,o/w up to date                         Also Pt denies chest pain, increased sob or doe, wheezing, orthopnea, PND, increased LE swelling, palpitations, dizziness or syncope.   Pt denies polydipsia, polyuria, or new focal neuro s/s.    Pt denies fever, wt loss, night sweats, loss of appetite, or other constitutional symptoms  Has mild right third finger dupytrens but hand still functional.  Has new enlarging right upper chest skin lesion she is concerned about.  Denies worsening depressive symptoms, suicidal ideation, or panic; has ongoing anxiety.  Pt continues to have recurring LBP without change in severity, bowel or bladder change, fever, wt loss,  worsening LE pain/numbness/weakness, gait change or falls.     Wt Readings from Last 3 Encounters:  11/10/22 93 lb (42.2 kg)  05/06/22 96 lb (43.5 kg)  05/02/22 93 lb (42.2 kg)   BP Readings from Last 3 Encounters:  11/10/22 120/72  05/06/22 (!) 178/96  11/04/21 (!) 170/92   Immunization History  Administered Date(s) Administered   Fluad Quad(high Dose 65+) 04/05/2019, 04/25/2020, 05/01/2021, 05/06/2022   Influenza Whole 03/19/2007   Influenza, High Dose Seasonal PF 03/08/2013, 03/07/2014, 03/14/2015, 03/26/2017, 03/30/2018   Influenza,inj,Quad PF,6+ Mos 03/12/2016   PFIZER(Purple Top)SARS-COV-2 Vaccination 07/24/2019, 08/17/2019, 03/19/2020   Pneumococcal Conjugate-13 03/29/2013   Pneumococcal Polysaccharide-23 01/21/2006   Td 11/12/2009   Zoster, Live 01/21/2006   Health Maintenance Due  Topic Date Due   Zoster Vaccines- Shingrix (1 of 2) Never done   DTaP/Tdap/Td (2 - Tdap) 11/13/2019      Past  Medical History:  Diagnosis Date   ABNORMAL THYROID FUNCTION TESTS 12/10/2009   ALCOHOLIC HEPATITIS, HX OF 02/14/2007   ALLERGIC RHINITIS 02/14/2007   Anemia, unspecified 04/26/2012   ANXIETY 03/19/2007   Breast cancer ([facility])    Breast implant removal status 12/23/2008   COLONIC POLYPS, HX OF 02/14/2007   COPD (chronic obstructive pulmonary disease) ([facility])    Coronary artery calcification seen on CT scan 10/31/2013   GOITER, MULTINODULAR 02/14/2007   HX, PERSONAL, ALCOHOLISM 02/14/2007   HYPERLIPIDEMIA 02/14/2007   HYPERTENSION 02/14/2007   Insomnia    OSTEOARTHRITIS 02/14/2007   OSTEOPOROSIS 02/14/2007   PANCREATITIS, HX OF 02/14/2007   Smoker 09/06/2013   Past Surgical History:  Procedure Laterality Date   BREAST SURGERY  2010   silicone prepectoral implants explanted by Dr. Shon Hough after rupture   BREAST SURGERY     left simple mastectomy and left axillary sentinel node biopsy, right prophylactic mastectomy   CATARACT EXTRACTION     NASAL SINUS SURGERY Left 06/14/2021   Procedure: ENDOSCOPIC MAXILLARY ANTROSTOMY WITH REMOVAL OF TISSUE;  Surgeon: Serena Colonel, MD;  Location: [facility] OR;  Service: ENT;  Laterality: Left;   peridontal  2010   s/p jaw and periodontal surgury  2009    reports that she quit smoking about 21 months ago. Her smoking use included cigarettes. She smoked an average of .5 packs per day. She has never used smokeless tobacco. She reports that she  does not currently use alcohol. She reports that she does not use drugs. family history includes Hyperlipidemia in her brother. She was adopted. Allergies  Allergen Reactions   Tetracycline Anaphylaxis   Lovastatin Other (See Comments)    Muscle cramps   Pravastatin Other (See Comments)    Leg cramps   Current Outpatient Medications on File Prior to Visit  Medication Sig Dispense Refill   acetaminophen (TYLENOL) 325 MG tablet Take 650 mg by mouth every 6 (six) hours as needed.     ALPRAZolam (XANAX) 0.25 MG  tablet TAKE ONE TABLET THREE TIMES DAILY AS NEEDED 90 tablet 5   Cholecalciferol (VITAMIN D) 1000 UNITS capsule Take 1,000 Units by mouth daily.       cyanocobalamin 1000 MCG tablet Take 1,000 mcg by mouth daily.     oxymetazoline (AFRIN) 0.05 % nasal spray Place 1 spray into both nostrils daily as needed for congestion.     trimethoprim (TRIMPEX) 100 MG tablet Take 1 tablet (100 mg total) by mouth daily. 90 tablet 3   No current facility-administered medications on file prior to visit.        ROS:  All others reviewed and negative.  Objective        PE:  BP 120/72 (BP Location: Right Arm, Patient Position: Sitting, Cuff Size: Normal)   Pulse (!) 117   Temp 98.5 F (36.9 C) (Oral)   Ht 5' (1.524 m)   Wt 93 lb (42.2 kg)   SpO2 99%   BMI 18.16 kg/m                 Constitutional: Pt appears in NAD               HENT: Head: NCAT.                Right Ear: External ear normal.                 Left Ear: External ear normal.                Eyes: . Pupils are equal, round, and reactive to light. Conjunctivae and EOM are normal               Nose: without d/c or deformity               Neck: Neck supple. Gross normal ROM               Cardiovascular: Normal rate and regular rhythm.                 Pulmonary/Chest: Effort normal and breath sounds without rales or wheezing.                Abd:  Soft, NT, ND, + BS, no organomegaly               Neurological: Pt is alert. At baseline orientation, motor grossly intact               Skin: Skin is warm., LE edema - none, right upper chest with < 1 cm slightly raised dark lesions with regular edges               Psychiatric: Pt behavior is normal without agitation   Micro: none  Cardiac tracings I have personally interpreted today:  none  Pertinent Radiological findings (summarize): none   Lab Results  Component Value Date   WBC 6.9 11/10/2022   HGB 11.5 (L) 11/10/2022  HCT 37.1 11/10/2022   PLT 362.0 11/10/2022   GLUCOSE 97  11/10/2022   CHOL 194 11/10/2022   TRIG 138.0 11/10/2022   HDL 50.90 11/10/2022   LDLCALC 116 (H) 11/10/2022   ALT 11 11/10/2022   AST 18 11/10/2022   NA 135 11/10/2022   K 4.3 11/10/2022   CL 107 11/10/2022   CREATININE 1.37 (H) 11/10/2022   BUN 22 11/10/2022   CO2 21 11/10/2022   TSH 0.54 11/10/2022   HGBA1C 6.0 11/10/2022   Assessment/Plan:  Tammy Wolfe is a 82 y.o. White or Caucasian [1] female with  has a past medical history of ABNORMAL THYROID FUNCTION TESTS (12/10/2009), ALCOHOLIC HEPATITIS, HX OF (02/14/2007), ALLERGIC RHINITIS (02/14/2007), Anemia, unspecified (04/26/2012), ANXIETY (03/19/2007), Breast cancer ([facility]), Breast implant removal status (12/23/2008), COLONIC POLYPS, HX OF (02/14/2007), COPD (chronic obstructive pulmonary disease) ([facility]), Coronary artery calcification seen on CT scan (10/31/2013), GOITER, MULTINODULAR (02/14/2007), HX, PERSONAL, ALCOHOLISM (02/14/2007), HYPERLIPIDEMIA (02/14/2007), HYPERTENSION (02/14/2007), Insomnia, OSTEOARTHRITIS (02/14/2007), OSTEOPOROSIS (02/14/2007), PANCREATITIS, HX OF (02/14/2007), and Smoker (09/06/2013).  Encounter for well adult exam with abnormal findings Age and sex appropriate education and counseling updated with regular exercise and diet Referrals for preventative services - none needed Immunizations addressed - for singrix and tdap at pharmacy Smoking counseling  - none needed Evidence for depression or other mood disorder - stable anxiety Most recent labs reviewed. I have personally reviewed and have noted: 1) the patient's medical and social history 2) The patient's current medications and supplements 3) The patient's height, weight, and BMI have been recorded in the chart   COPD (chronic obstructive pulmonary disease) with chronic bronchitis Stable, cont current inhaler tx  Hyperlipidemia Lab Results  Component Value Date   LDLCALC 116 (H) 11/10/2022   Uncontrolled, pt for lower chol diet, declines  statin   Generalized anxiety disorder Stable chronic, declines need for change in tx or counseling  Impaired glucose tolerance Lab Results  Component Value Date   HGBA1C 6.0 11/10/2022   Stable, pt to continue current medical treatment  - diet, wt control   Anxiety Chronic stable, cont xanax prn  CKD (chronic kidney disease) stage 3, GFR 30-59 ml/min ([facility]) Lab Results  Component Value Date   CREATININE 1.37 (H) 11/10/2022   Stable overall, cont to avoid nephrotoxins   B12 deficiency Lab Results  Component Value Date   VITAMINB12 154 (L) 11/10/2022   Low, to start oral replacement - b12 1000 mcg qd   Chronic back pain Stable, cont current med tx  Tendonitis of right hand Mild right third dupytrens - declines hand surgury referral for now  Skin lesion of chest wall New lesion, for derm referral  Followup: Return in about 6 months (around 05/12/2023).  Oliver Barre, MD 11/13/2022 7:23 PM [facility]

## 2022-11-13 ENCOUNTER — Encounter: Payer: Self-pay | Admitting: Internal Medicine

## 2022-11-13 DIAGNOSIS — L989 Disorder of the skin and subcutaneous tissue, unspecified: Secondary | ICD-10-CM | POA: Insufficient documentation

## 2022-11-13 DIAGNOSIS — M778 Other enthesopathies, not elsewhere classified: Secondary | ICD-10-CM | POA: Insufficient documentation

## 2022-11-13 NOTE — Assessment & Plan Note (Signed)
Stable chronic, declines need for change in tx or counseling

## 2022-11-13 NOTE — Assessment & Plan Note (Signed)
Lab Results  Component Value Date   HGBA1C 6.0 11/10/2022   Stable, pt to continue current medical treatment  - diet, wt control

## 2022-11-13 NOTE — Assessment & Plan Note (Signed)
Lab Results  Component Value Date   VITAMINB12 154 (L) 11/10/2022   Low, to start oral replacement - b12 1000 mcg qd

## 2022-11-13 NOTE — Assessment & Plan Note (Signed)
Lab Results  Component Value Date   LDLCALC 116 (H) 11/10/2022   Uncontrolled, pt for lower chol diet, declines statin

## 2022-11-13 NOTE — Assessment & Plan Note (Signed)
Stable, cont current med tx 

## 2022-11-13 NOTE — Assessment & Plan Note (Signed)
New lesion, for derm referral

## 2022-11-13 NOTE — Assessment & Plan Note (Signed)
Lab Results  Component Value Date   CREATININE 1.37 (H) 11/10/2022   Stable overall, cont to avoid nephrotoxins

## 2022-11-13 NOTE — Assessment & Plan Note (Signed)
Mild right third dupytrens - declines hand surgury referral for now

## 2022-11-13 NOTE — Assessment & Plan Note (Signed)
Chronic stable, cont xanax prn

## 2022-11-13 NOTE — Assessment & Plan Note (Signed)
Stable, cont current inhaler tx 

## 2022-11-13 NOTE — Assessment & Plan Note (Signed)
Age and sex appropriate education and counseling updated with regular exercise and diet Referrals for preventative services - none needed Immunizations addressed - for singrix and tdap at pharmacy Smoking counseling  - none needed Evidence for depression or other mood disorder - stable anxiety Most recent labs reviewed. I have personally reviewed and have noted: 1) the patient's medical and social history 2) The patient's current medications and supplements 3) The patient's height, weight, and BMI have been recorded in the chart

## 2022-12-25 DIAGNOSIS — D1801 Hemangioma of skin and subcutaneous tissue: Secondary | ICD-10-CM | POA: Diagnosis not present

## 2022-12-25 DIAGNOSIS — L821 Other seborrheic keratosis: Secondary | ICD-10-CM | POA: Diagnosis not present

## 2023-04-21 ENCOUNTER — Ambulatory Visit (INDEPENDENT_AMBULATORY_CARE_PROVIDER_SITE_OTHER): Payer: Medicare Other | Admitting: *Deleted

## 2023-04-21 DIAGNOSIS — Z0000 Encounter for general adult medical examination without abnormal findings: Secondary | ICD-10-CM

## 2023-04-21 NOTE — Patient Instructions (Signed)
Tammy Wolfe , Thank you for taking time to come for your Medicare Wellness Visit. I appreciate your ongoing commitment to your health goals. Please review the following plan we discussed and let me know if I can assist you in the future.   Screening recommendations/referrals: Colonoscopy: no longer required Mammogram:  Bone Density: up to date Recommended yearly ophthalmology/optometry visit for glaucoma screening and checkup Recommended yearly dental visit for hygiene and checkup  Vaccinations: Influenza vaccine: up to date Pneumococcal vaccine: up to date Tdap vaccine: Education provided Shingles vaccine: Education provided    Advanced directives: yes not on file   Preventive Care 65 Years and Older, Female Preventive care refers to lifestyle choices and visits with your health care provider that can promote health and wellness. What does preventive care include? A yearly physical exam. This is also called an annual well check. Dental exams once or twice a year. Routine eye exams. Ask your health care provider how often you should have your eyes checked. Personal lifestyle choices, including: Daily care of your teeth and gums. Regular physical activity. Eating a healthy diet. Avoiding tobacco and drug use. Limiting alcohol use. Practicing safe sex. Taking low-dose aspirin every day. Taking vitamin and mineral supplements as recommended by your health care provider. What happens during an annual well check? The services and screenings done by your health care provider during your annual well check will depend on your age, overall health, lifestyle risk factors, and family history of disease. Counseling  Your health care provider may ask you questions about your: Alcohol use. Tobacco use. Drug use. Emotional well-being. Home and relationship well-being. Sexual activity. Eating habits. History of falls. Memory and ability to understand (cognition). Work and work  Astronomer. Reproductive health. Screening  You may have the following tests or measurements: Height, weight, and BMI. Blood pressure. Lipid and cholesterol levels. These may be checked every 5 years, or more frequently if you are over 37 years old. Skin check. Lung cancer screening. You may have this screening every year starting at age 82 if you have a 30-pack-year history of smoking and currently smoke or have quit within the past 15 years. Fecal occult blood test (FOBT) of the stool. You may have this test every year starting at age 82. Flexible sigmoidoscopy or colonoscopy. You may have a sigmoidoscopy every 5 years or a colonoscopy every 10 years starting at age 82. Hepatitis C blood test. Hepatitis B blood test. Sexually transmitted disease (STD) testing. Diabetes screening. This is done by checking your blood sugar (glucose) after you have not eaten for a while (fasting). You may have this done every 1-3 years. Bone density scan. This is done to screen for osteoporosis. You may have this done starting at age 82. Mammogram. This may be done every 1-2 years. Talk to your health care provider about how often you should have regular mammograms. Talk with your health care provider about your test results, treatment options, and if necessary, the need for more tests. Vaccines  Your health care provider may recommend certain vaccines, such as: Influenza vaccine. This is recommended every year. Tetanus, diphtheria, and acellular pertussis (Tdap, Td) vaccine. You may need a Td booster every 10 years. Zoster vaccine. You may need this after age 82. Pneumococcal 13-valent conjugate (PCV13) vaccine. One dose is recommended after age 82. Pneumococcal polysaccharide (PPSV23) vaccine. One dose is recommended after age 82. Talk to your health care provider about which screenings and vaccines you need and how often you need  them. This information is not intended to replace advice given to you by  your health care provider. Make sure you discuss any questions you have with your health care provider. Document Released: 06/08/2015 Document Revised: 01/30/2016 Document Reviewed: 03/13/2015 Elsevier Interactive Patient Education  2017 [employer].  Fall Prevention in the Home Falls can cause injuries. They can happen to people of all ages. There are many things you can do to make your home safe and to help prevent falls. What can I do on the outside of my home? Regularly fix the edges of walkways and driveways and fix any cracks. Remove anything that might make you trip as you walk through a door, such as a raised step or threshold. Trim any bushes or trees on the path to your home. Use bright outdoor lighting. Clear any walking paths of anything that might make someone trip, such as rocks or tools. Regularly check to see if handrails are loose or broken. Make sure that both sides of any steps have handrails. Any raised decks and porches should have guardrails on the edges. Have any leaves, snow, or ice cleared regularly. Use sand or salt on walking paths during winter. Clean up any spills in your garage right away. This includes oil or grease spills. What can I do in the bathroom? Use night lights. Install grab bars by the toilet and in the tub and shower. Do not use towel bars as grab bars. Use non-skid mats or decals in the tub or shower. If you need to sit down in the shower, use a plastic, non-slip stool. Keep the floor dry. Clean up any water that spills on the floor as soon as it happens. Remove soap buildup in the tub or shower regularly. Attach bath mats securely with double-sided non-slip rug tape. Do not have throw rugs and other things on the floor that can make you trip. What can I do in the bedroom? Use night lights. Make sure that you have a light by your bed that is easy to reach. Do not use any sheets or blankets that are too big for your bed. They should not hang  down onto the floor. Have a firm chair that has side arms. You can use this for support while you get dressed. Do not have throw rugs and other things on the floor that can make you trip. What can I do in the kitchen? Clean up any spills right away. Avoid walking on wet floors. Keep items that you use a lot in easy-to-reach places. If you need to reach something above you, use a strong step stool that has a grab bar. Keep electrical cords out of the way. Do not use floor polish or wax that makes floors slippery. If you must use wax, use non-skid floor wax. Do not have throw rugs and other things on the floor that can make you trip. What can I do with my stairs? Do not leave any items on the stairs. Make sure that there are handrails on both sides of the stairs and use them. Fix handrails that are broken or loose. Make sure that handrails are as long as the stairways. Check any carpeting to make sure that it is firmly attached to the stairs. Fix any carpet that is loose or worn. Avoid having throw rugs at the top or bottom of the stairs. If you do have throw rugs, attach them to the floor with carpet tape. Make sure that you have a light switch at  the top of the stairs and the bottom of the stairs. If you do not have them, ask someone to add them for you. What else can I do to help prevent falls? Wear shoes that: Do not have high heels. Have rubber bottoms. Are comfortable and fit you well. Are closed at the toe. Do not wear sandals. If you use a stepladder: Make sure that it is fully opened. Do not climb a closed stepladder. Make sure that both sides of the stepladder are locked into place. Ask someone to hold it for you, if possible. Clearly mark and make sure that you can see: Any grab bars or handrails. First and last steps. Where the edge of each step is. Use tools that help you move around (mobility aids) if they are needed. These  include: Canes. Walkers. Scooters. Crutches. Turn on the lights when you go into a dark area. Replace any light bulbs as soon as they burn out. Set up your furniture so you have a clear path. Avoid moving your furniture around. If any of your floors are uneven, fix them. If there are any pets around you, be aware of where they are. Review your medicines with your doctor. Some medicines can make you feel dizzy. This can increase your chance of falling. Ask your doctor what other things that you can do to help prevent falls. This information is not intended to replace advice given to you by your health care provider. Make sure you discuss any questions you have with your health care provider. Document Released: 03/08/2009 Document Revised: 10/18/2015 Document Reviewed: 06/16/2014 Elsevier Interactive Patient Education  2017 [employer].

## 2023-04-21 NOTE — Progress Notes (Signed)
Subjective:   Tammy Wolfe is a 82 y.o. female who presents for Medicare Annual (Subsequent) preventive examination.  Visit Complete: Virtual I connected with  Serafina Emmanuel on 04/21/23 by a audio enabled telemedicine application and verified that I am speaking with the correct person using two identifiers.  Patient Location: Home  Provider Location: Home Office  I discussed the limitations of evaluation and management by telemedicine. The patient expressed understanding and agreed to proceed.  Vital Signs: Because this visit was a virtual/telehealth visit, some criteria may be missing or patient reported. Any vitals not documented were not able to be obtained and vitals that have been documented are patient reported.  .  Cardiac Risk Factors include: advanced age (>34men, >66 women);hypertension     Objective:    There were no vitals filed for this visit. There is no height or weight on file to calculate BMI.     04/21/2023    3:32 PM 05/02/2022    8:17 AM 06/12/2021   10:12 AM 05/01/2021    9:33 AM 04/05/2019    2:03 PM 03/30/2018    2:36 PM  Advanced Directives  Does Patient Have a Medical Advance Directive? Yes Yes No Yes Yes Yes  Type of Advance Directive Living will Healthcare Power of [place];Living will  Living will;Healthcare Power of [employer] Power of [place];Living will Healthcare Power of [place];Living will  Does patient want to make changes to medical advance directive?    No - Patient declined    Copy of Healthcare Power of Attorney in Chart?  No - copy requested  No - copy requested No - copy requested No - copy requested  Would patient like information on creating a medical advance directive?   No - Patient declined       Current Medications (verified) Outpatient Encounter Medications as of 04/21/2023  Medication Sig   acetaminophen (TYLENOL) 325 MG tablet Take 650 mg by mouth every 6 (six) hours as needed.   ALPRAZolam (XANAX) 0.25 MG tablet  TAKE ONE TABLET THREE TIMES DAILY AS NEEDED   Cholecalciferol (VITAMIN D) 1000 UNITS capsule Take 1,000 Units by mouth daily.     cyanocobalamin 1000 MCG tablet Take 1,000 mcg by mouth daily.   cyclobenzaprine (FLEXERIL) 5 MG tablet 1 tab by mouth at bedtime as needed for cramps and back pain   HYDROcodone-acetaminophen (NORCO/VICODIN) 5-325 MG tablet Take 1 tablet by mouth 2 (two) times daily as needed.   losartan (COZAAR) 100 MG tablet Take 1 tablet (100 mg total) by mouth daily.   trimethoprim (TRIMPEX) 100 MG tablet Take 1 tablet (100 mg total) by mouth daily.   oxymetazoline (AFRIN) 0.05 % nasal spray Place 1 spray into both nostrils daily as needed for congestion. (Patient not taking: Reported on 04/21/2023)   No facility-administered encounter medications on file as of 04/21/2023.    Allergies (verified) Tetracycline, Lovastatin, and Pravastatin   History: Past Medical History:  Diagnosis Date   ABNORMAL THYROID FUNCTION TESTS 12/10/2009   ALCOHOLIC HEPATITIS, HX OF 02/14/2007   ALLERGIC RHINITIS 02/14/2007   Anemia, unspecified 04/26/2012   ANXIETY 03/19/2007   Breast cancer (HCC)    Breast implant removal status 12/23/2008   COLONIC POLYPS, HX OF 02/14/2007   COPD (chronic obstructive pulmonary disease) (HCC)    Coronary artery calcification seen on CT scan 10/31/2013   GOITER, MULTINODULAR 02/14/2007   HX, PERSONAL, ALCOHOLISM 02/14/2007   HYPERLIPIDEMIA 02/14/2007   HYPERTENSION 02/14/2007   Insomnia    OSTEOARTHRITIS 02/14/2007  OSTEOPOROSIS 02/14/2007   PANCREATITIS, HX OF 02/14/2007   Smoker 09/06/2013   Past Surgical History:  Procedure Laterality Date   BREAST SURGERY  2010   silicone prepectoral implants explanted by Dr. Shon Hough after rupture   BREAST SURGERY     left simple mastectomy and left axillary sentinel node biopsy, right prophylactic mastectomy   CATARACT EXTRACTION     NASAL SINUS SURGERY Left 06/14/2021   Procedure: ENDOSCOPIC MAXILLARY  ANTROSTOMY WITH REMOVAL OF TISSUE;  Surgeon: Serena Colonel, MD;  Location: [facility] OR;  Service: ENT;  Laterality: Left;   peridontal  2010   s/p jaw and periodontal surgury  2009   Family History  Adopted: Yes  Problem Relation Age of Onset   Hyperlipidemia Brother    Social History   Socioeconomic History   Marital status: Divorced    Spouse name: Not on file   Number of children: 3   Years of education: Not on file   Highest education level: Not on file  Occupational History   Occupation: retired Visual merchandiser  Tobacco Use   Smoking status: Former    Current packs/day: 0.00    Types: Cigarettes    Quit date: 01/24/2021    Years since quitting: 2.2   Smokeless tobacco: Never   Tobacco comments:    smoking about 4 cigs/day (as of 04/10/16)-gwd  Vaping Use   Vaping status: Never Used  Substance and Sexual Activity   Alcohol use: Not Currently    Alcohol/week: 0.0 standard drinks of alcohol    Comment: one glass/wine occasional/ no drinking now   Drug use: No   Sexual activity: Not Currently  Other Topics Concern   Not on file  Social History Narrative   Not on file   Social Determinants of Health   Financial Resource Strain: Low Risk  (04/21/2023)   Overall Financial Resource Strain (CARDIA)    Difficulty of Paying Living Expenses: Not hard at all  Food Insecurity: No Food Insecurity (04/21/2023)   Hunger Vital Sign    Worried About Running Out of Food in the Last Year: Never true    Ran Out of Food in the Last Year: Never true  Transportation Needs: No Transportation Needs (04/21/2023)   PRAPARE - Administrator, Civil Service (Medical): No    Lack of Transportation (Non-Medical): No  Physical Activity: Inactive (04/21/2023)   Exercise Vital Sign    Days of Exercise per Week: 0 days    Minutes of Exercise per Session: 0 min  Stress: No Stress Concern Present (04/21/2023)   [employer] of Occupational Health - Occupational Stress Questionnaire    Feeling  of Stress : Not at all  Social Connections: Moderately Isolated (04/21/2023)   Social Connection and Isolation Panel [NHANES]    Frequency of Communication with Friends and Family: More than three times a week    Frequency of Social Gatherings with Friends and Family: More than three times a week    Attends Religious Services: Never    Database administrator or Organizations: Yes    Attends Engineer, structural: More than 4 times per year    Marital Status: Divorced    Tobacco Counseling Counseling given: Not Answered Tobacco comments: smoking about 4 cigs/day (as of 04/10/16)-gwd   Clinical Intake:  Pre-visit preparation completed: Yes  Pain : No/denies pain     Diabetes: No  How often do you need to have someone help you when you read instructions, pamphlets, or other  written materials from your doctor or pharmacy?: 1 - Never  Interpreter Needed?: No  Information entered by :: Remi Haggard LPN   Activities of Daily Living    04/21/2023    3:37 PM 05/02/2022    8:18 AM  In your present state of health, do you have any difficulty performing the following activities:  Hearing? 0 0  Vision? 0 0  Difficulty concentrating or making decisions? 0 0  Walking or climbing stairs? 0 0  Dressing or bathing? 0 0  Doing errands, shopping? 0 0  Preparing Food and eating ? N N  Using the Toilet? N N  In the past six months, have you accidently leaked urine? N N  Do you have problems with loss of bowel control? N N  Managing your Medications? N N  Managing your Finances? N N  Housekeeping or managing your Housekeeping? N N    Patient Care Team: Corwin Levins, MD as PCP - General Alfredo Martinez, MD as Consulting Physician (Urology) Maris Berger, MD as Consulting Physician (Ophthalmology) Magrinat, Valentino Hue, MD (Inactive) as Consulting Physician (Oncology)  Indicate any recent Medical Services you may have received from other than [facility] providers in the past  year (date may be approximate).     Assessment:   This is a routine wellness examination for Tammy Wolfe.  Hearing/Vision screen Hearing Screening - Comments:: No trouble hearing Vision Screening - Comments:: [facility]  Up to date   Goals Addressed             This Visit's Progress    Patient Stated       Continue current lifestyle       Depression Screen    04/21/2023    3:37 PM 11/10/2022    1:08 PM 05/06/2022    1:42 PM 05/02/2022    8:18 AM 05/01/2021    9:19 AM 04/25/2020    2:39 PM 04/25/2020    2:12 PM  PHQ 2/9 Scores  PHQ - 2 Score 0 0 0 0 0 0 0  PHQ- 9 Score 0  0        Fall Risk    04/21/2023    3:31 PM 11/10/2022    1:08 PM 05/06/2022    1:42 PM 05/02/2022    8:18 AM 05/01/2021    9:20 AM  Fall Risk   Falls in the past year? 0 0 0 0 0  Number falls in past yr: 0 0  0 0  Injury with Fall? 0 0 0 0 0  Risk for fall due to :  No Fall Risks No Fall Risks Medication side effect No Fall Risks  Follow up Falls evaluation completed;Education provided;Falls prevention discussed Falls evaluation completed Falls evaluation completed Falls prevention discussed;Education provided;Falls evaluation completed Falls prevention discussed    MEDICARE RISK AT HOME: Medicare Risk at Home Any stairs in or around the home?: No If so, are there any without handrails?: No Home free of loose throw rugs in walkways, pet beds, electrical cords, etc?: Yes Adequate lighting in your home to reduce risk of falls?: Yes Life alert?: No Use of a cane, walker or w/c?: No Grab bars in the bathroom?: Yes Shower chair or bench in shower?: Yes Elevated toilet seat or a handicapped toilet?: Yes  TIMED UP AND GO:  Was the test performed?  No    Cognitive Function:    03/30/2018    2:37 PM  MMSE - Mini Mental State Exam  Orientation to time 5  Orientation to Place 5  Registration 3  Attention/ Calculation 5  Recall 2  Language- name 2 objects 2  Language- repeat 1  Language- follow 3  step command 3  Language- read & follow direction 1  Write a sentence 1  Copy design 1  Total score 29        04/21/2023    3:34 PM 05/02/2022    8:19 AM  6CIT Screen  What Year? 0 points 0 points  What month? 0 points 0 points  What time? 0 points 0 points  Count back from 20 0 points 0 points  Months in reverse 0 points 0 points  Repeat phrase 0 points 0 points  Total Score 0 points 0 points    Immunizations Immunization History  Administered Date(s) Administered   Fluad Quad(high Dose 65+) 04/05/2019, 04/25/2020, 05/01/2021, 05/06/2022, 02/24/2023   Influenza Whole 03/19/2007   Influenza, High Dose Seasonal PF 03/08/2013, 03/07/2014, 03/14/2015, 03/26/2017, 03/30/2018   Influenza,inj,Quad PF,6+ Mos 03/12/2016   PFIZER(Purple Top)SARS-COV-2 Vaccination 07/24/2019, 08/17/2019, 03/19/2020, 02/24/2023   Pneumococcal Conjugate-13 03/29/2013   Pneumococcal Polysaccharide-23 01/21/2006   Td 11/12/2009   Zoster, Live 01/21/2006    TDAP status: Due, Education has been provided regarding the importance of this vaccine. Advised may receive this vaccine at local pharmacy or Health Dept. Aware to provide a copy of the vaccination record if obtained from local pharmacy or Health Dept. Verbalized acceptance and understanding.  Flu Vaccine status: Up to date  Pneumococcal vaccine status: Up to date  Covid-19 vaccine status: Completed vaccines  Qualifies for Shingles Vaccine? Yes   Zostavax completed No   Shingrix Completed?: No.    Education has been provided regarding the importance of this vaccine. Patient has been advised to call insurance company to determine out of pocket expense if they have not yet received this vaccine. Advised may also receive vaccine at local pharmacy or Health Dept. Verbalized acceptance and understanding.  Screening Tests Health Maintenance  Topic Date Due   Zoster Vaccines- Shingrix (1 of 2) 07/22/2023 (Originally 07/26/1959)   DTaP/Tdap/Td (2 - Tdap)  04/20/2024 (Originally 11/13/2019)   Medicare Annual Wellness (AWV)  04/20/2024   Pneumonia Vaccine 53+ Years old  Completed   INFLUENZA VACCINE  Completed   DEXA SCAN  Completed   HPV VACCINES  Aged Out   COVID-19 Vaccine  Discontinued    Health Maintenance  There are no preventive care reminders to display for this patient.   Colorectal cancer screening: No longer required.   Mammogram  Double Mastectomy  Bone Density status: Completed  . Results reflect: Bone density results: OSTEOPOROSIS. Repeat every 2 years.  Lung Cancer Screening: (Low Dose CT Chest recommended if Age 79-80 years, 20 pack-year currently smoking OR have quit w/in 15years.) does not qualify.   Lung Cancer Screening Referral:  Additional Screening:  Hepatitis C Screening: does not qualify;   Vision Screening: Recommended annual ophthalmology exams for early detection of glaucoma and other disorders of the eye. Is the patient up to date with their annual eye exam?  Yes  Who is the provider or what is the name of the office in which the patient attends annual eye exams? [facility] If pt is not established with a provider, would they like to be referred to a provider to establish care? No .   Dental Screening: Recommended annual dental exams for proper oral hygiene   Community Resource Referral / Chronic Care Management: CRR required this visit?  No   CCM required  this visit?  No     Plan:     I have personally reviewed and noted the following in the patient's chart:   Medical and social history Use of alcohol, tobacco or illicit drugs  Current medications and supplements including opioid prescriptions. Patient is currently taking opioid prescriptions. Information provided to patient regarding non-opioid alternatives. Patient advised to discuss non-opioid treatment plan with their provider. Functional ability and status Nutritional status Physical activity Advanced directives List of other  physicians Hospitalizations, surgeries, and ER visits in previous 12 months Vitals Screenings to include cognitive, depression, and falls Referrals and appointments  In addition, I have reviewed and discussed with patient certain preventive protocols, quality metrics, and best practice recommendations. A written personalized care plan for preventive services as well as general preventive health recommendations were provided to patient.     Remi Haggard, LPN   09/81/1914   After Visit Summary: (MyChart) Due to this being a telephonic visit, the after visit summary with patients personalized plan was offered to patient via MyChart   Nurse Notes:

## 2023-05-12 ENCOUNTER — Encounter: Payer: Self-pay | Admitting: Internal Medicine

## 2023-05-12 ENCOUNTER — Ambulatory Visit: Payer: Medicare Other | Admitting: Internal Medicine

## 2023-05-12 VITALS — BP 126/82 | HR 115 | Temp 97.9°F | Ht 60.0 in | Wt 89.0 lb

## 2023-05-12 DIAGNOSIS — F419 Anxiety disorder, unspecified: Secondary | ICD-10-CM

## 2023-05-12 DIAGNOSIS — N1831 Chronic kidney disease, stage 3a: Secondary | ICD-10-CM

## 2023-05-12 DIAGNOSIS — E538 Deficiency of other specified B group vitamins: Secondary | ICD-10-CM

## 2023-05-12 DIAGNOSIS — R7302 Impaired glucose tolerance (oral): Secondary | ICD-10-CM

## 2023-05-12 DIAGNOSIS — G8929 Other chronic pain: Secondary | ICD-10-CM

## 2023-05-12 DIAGNOSIS — M545 Low back pain, unspecified: Secondary | ICD-10-CM | POA: Diagnosis not present

## 2023-05-12 DIAGNOSIS — E7800 Pure hypercholesterolemia, unspecified: Secondary | ICD-10-CM

## 2023-05-12 DIAGNOSIS — I10 Essential (primary) hypertension: Secondary | ICD-10-CM | POA: Diagnosis not present

## 2023-05-12 MED ORDER — HYDROCODONE-ACETAMINOPHEN 5-325 MG PO TABS: 1.0000 | ORAL_TABLET | Freq: Two times a day (BID) | ORAL | 0 refills | Status: DC | PRN

## 2023-05-12 MED ORDER — ALPRAZOLAM 0.25 MG PO TABS: ORAL_TABLET | ORAL | 5 refills | Status: DC

## 2023-05-12 MED ORDER — LOSARTAN POTASSIUM 100 MG PO TABS: 100.0000 mg | ORAL_TABLET | Freq: Every day | ORAL | 3 refills | Status: DC

## 2023-05-12 MED ORDER — CYCLOBENZAPRINE HCL 5 MG PO TABS: ORAL_TABLET | ORAL | 3 refills | Status: DC

## 2023-05-12 NOTE — Assessment & Plan Note (Signed)
BP Readings from Last 3 Encounters:  05/12/23 126/82  11/10/22 120/72  05/06/22 (!) 178/96   Stable, pt to continue medical treatment losartan 100 qd

## 2023-05-12 NOTE — Assessment & Plan Note (Signed)
Lab Results  Component Value Date   LDLCALC 116 (H) 11/10/2022   Uncontrolled,, pt for low chol diet, declines statin for now

## 2023-05-12 NOTE — Assessment & Plan Note (Signed)
Lab Results  Component Value Date   VITAMINB12 154 (L) 11/10/2022   Low, to start oral replacement - b12 1000 mcg qd

## 2023-05-12 NOTE — Assessment & Plan Note (Signed)
Stable overall, cont follow , for hydrocodone prn refill

## 2023-05-12 NOTE — Patient Instructions (Addendum)
Please continue all other medications as before, and refills have been done if requested.  Please have the pharmacy call with any other refills you may need.  Please continue your efforts at being more active, low cholesterol diet, and weight control  Please keep your appointments with your specialists as you may have planned  We can hold on lab testing today  Please make an Appointment to return in 6 months, or sooner if needed

## 2023-05-12 NOTE — Progress Notes (Signed)
Patient ID: Tammy Wolfe, female   DOB: 1940/06/13, 82 y.o.   MRN: [id number]        Chief Complaint: follow up HTN, HLD and hyperglycemia , low b12, chronic lbp       HPI:  Tammy Wolfe is a 82 y.o. female here overall doing ok;  Pt denies chest pain, increased sob or doe, wheezing, orthopnea, PND, increased LE swelling, palpitations, dizziness or syncope.   Pt denies polydipsia, polyuria, or new focal neuro s/s.    Pt denies fever, wt loss, night sweats, loss of appetite, or other constitutional symptoms  Her most immediate problems are copd and neuropathy, but lives alone with good ability for ADLs,  Needs med refills.         Wt Readings from Last 3 Encounters:  05/12/23 89 lb (40.4 kg)  11/10/22 93 lb (42.2 kg)  05/06/22 96 lb (43.5 kg)   BP Readings from Last 3 Encounters:  05/12/23 126/82  11/10/22 120/72  05/06/22 (!) 178/96         Past Medical History:  Diagnosis Date   ABNORMAL THYROID FUNCTION TESTS 12/10/2009   ALCOHOLIC HEPATITIS, HX OF 02/14/2007   ALLERGIC RHINITIS 02/14/2007   Anemia, unspecified 04/26/2012   ANXIETY 03/19/2007   Breast cancer (HCC)    Breast implant removal status 12/23/2008   COLONIC POLYPS, HX OF 02/14/2007   COPD (chronic obstructive pulmonary disease) (HCC)    Coronary artery calcification seen on CT scan 10/31/2013   GOITER, MULTINODULAR 02/14/2007   HX, PERSONAL, ALCOHOLISM 02/14/2007   HYPERLIPIDEMIA 02/14/2007   HYPERTENSION 02/14/2007   Insomnia    OSTEOARTHRITIS 02/14/2007   OSTEOPOROSIS 02/14/2007   PANCREATITIS, HX OF 02/14/2007   Smoker 09/06/2013   Past Surgical History:  Procedure Laterality Date   BREAST SURGERY  2010   silicone prepectoral implants explanted by Dr. Shon Hough after rupture   BREAST SURGERY     left simple mastectomy and left axillary sentinel node biopsy, right prophylactic mastectomy   CATARACT EXTRACTION     NASAL SINUS SURGERY Left 06/14/2021   Procedure: ENDOSCOPIC MAXILLARY ANTROSTOMY WITH REMOVAL OF  TISSUE;  Surgeon: Serena Colonel, MD;  Location: [facility] OR;  Service: ENT;  Laterality: Left;   peridontal  2010   s/p jaw and periodontal surgury  2009    reports that she quit smoking about 2 years ago. Her smoking use included cigarettes. She has never used smokeless tobacco. She reports that she does not currently use alcohol. She reports that she does not use drugs. family history includes Hyperlipidemia in her brother. She was adopted. Allergies  Allergen Reactions   Tetracycline Anaphylaxis   Lovastatin Other (See Comments)    Muscle cramps   Pravastatin Other (See Comments)    Leg cramps   Current Outpatient Medications on File Prior to Visit  Medication Sig Dispense Refill   acetaminophen (TYLENOL) 325 MG tablet Take 650 mg by mouth every 6 (six) hours as needed.     Cholecalciferol (VITAMIN D) 1000 UNITS capsule Take 1,000 Units by mouth daily.       cyanocobalamin 1000 MCG tablet Take 1,000 mcg by mouth daily.     oxymetazoline (AFRIN) 0.05 % nasal spray Place 1 spray into both nostrils daily as needed for congestion.     trimethoprim (TRIMPEX) 100 MG tablet Take 1 tablet (100 mg total) by mouth daily. 90 tablet 3   No current facility-administered medications on file prior to visit.        ROS:  All others reviewed and negative.  Objective        PE:  BP 126/82 (BP Location: Right Arm, Patient Position: Sitting, Cuff Size: Normal)   Pulse (!) 115   Temp 97.9 F (36.6 C) (Oral)   Ht 5' (1.524 m)   Wt 89 lb (40.4 kg)   SpO2 97%   BMI 17.38 kg/m                 Constitutional: Pt appears in NAD               HENT: Head: NCAT.                Right Ear: External ear normal.                 Left Ear: External ear normal.                Eyes: . Pupils are equal, round, and reactive to light. Conjunctivae and EOM are normal               Nose: without d/c or deformity               Neck: Neck supple. Gross normal ROM               Cardiovascular: Normal rate and regular  rhythm.                 Pulmonary/Chest: Effort normal and breath sounds without rales or wheezing.                Abd:  Soft, NT, ND, + BS, no organomegaly               Neurological: Pt is alert. At baseline orientation, motor grossly intact               Skin: Skin is warm. No rashes, no other new lesions, LE edema - none               Psychiatric: Pt behavior is normal without agitation   Micro: none  Cardiac tracings I have personally interpreted today:  none  Pertinent Radiological findings (summarize): none   Lab Results  Component Value Date   WBC 6.9 11/10/2022   HGB 11.5 (L) 11/10/2022   HCT 37.1 11/10/2022   PLT 362.0 11/10/2022   GLUCOSE 97 11/10/2022   CHOL 194 11/10/2022   TRIG 138.0 11/10/2022   HDL 50.90 11/10/2022   LDLCALC 116 (H) 11/10/2022   ALT 11 11/10/2022   AST 18 11/10/2022   NA 135 11/10/2022   K 4.3 11/10/2022   CL 107 11/10/2022   CREATININE 1.37 (H) 11/10/2022   BUN 22 11/10/2022   CO2 21 11/10/2022   TSH 0.54 11/10/2022   HGBA1C 6.0 11/10/2022   Assessment/Plan:  Metra Most is a 82 y.o. White or Caucasian [1] female with  has a past medical history of ABNORMAL THYROID FUNCTION TESTS (12/10/2009), ALCOHOLIC HEPATITIS, HX OF (02/14/2007), ALLERGIC RHINITIS (02/14/2007), Anemia, unspecified (04/26/2012), ANXIETY (03/19/2007), Breast cancer (HCC), Breast implant removal status (12/23/2008), COLONIC POLYPS, HX OF (02/14/2007), COPD (chronic obstructive pulmonary disease) (HCC), Coronary artery calcification seen on CT scan (10/31/2013), GOITER, MULTINODULAR (02/14/2007), HX, PERSONAL, ALCOHOLISM (02/14/2007), HYPERLIPIDEMIA (02/14/2007), HYPERTENSION (02/14/2007), Insomnia, OSTEOARTHRITIS (02/14/2007), OSTEOPOROSIS (02/14/2007), PANCREATITIS, HX OF (02/14/2007), and Smoker (09/06/2013).  B12 deficiency Lab Results  Component Value Date   VITAMINB12 154 (L) 11/10/2022   Low, to start oral replacement - b12 1000 mcg qd  Hyperlipidemia Lab  Results  Component Value Date   LDLCALC 116 (H) 11/10/2022   Uncontrolled,, pt for low chol diet, declines statin for now   Essential hypertension BP Readings from Last 3 Encounters:  05/12/23 126/82  11/10/22 120/72  05/06/22 (!) 178/96   Stable, pt to continue medical treatment losartan 100 qd   Impaired glucose tolerance Lab Results  Component Value Date   HGBA1C 6.0 11/10/2022   Stable, pt to continue current medical treatment  - diet, wt control   Chronic low back pain Stable overall, cont follow , for hydrocodone prn refill  Anxiety Stable overall, for xanax refill  CKD (chronic kidney disease) stage 3, GFR 30-59 ml/min (HCC) Lab Results  Component Value Date   CREATININE 1.37 (H) 11/10/2022   Stable overall, cont to avoid nephrotoxins  Followup: Return in about 6 months (around 11/10/2023).  Oliver Barre, MD 05/12/2023 9:22 PM [facility]

## 2023-05-12 NOTE — Assessment & Plan Note (Signed)
Lab Results  Component Value Date   HGBA1C 6.0 11/10/2022   Stable, pt to continue current medical treatment  - diet, wt control

## 2023-05-12 NOTE — Assessment & Plan Note (Signed)
Stable overall, for xanax refill

## 2023-05-12 NOTE — Assessment & Plan Note (Signed)
Lab Results  Component Value Date   CREATININE 1.37 (H) 11/10/2022   Stable overall, cont to avoid nephrotoxins

## 2023-06-11 DIAGNOSIS — R350 Frequency of micturition: Secondary | ICD-10-CM | POA: Diagnosis not present

## 2023-06-12 DIAGNOSIS — H52203 Unspecified astigmatism, bilateral: Secondary | ICD-10-CM | POA: Diagnosis not present

## 2023-07-01 DIAGNOSIS — H26491 Other secondary cataract, right eye: Secondary | ICD-10-CM | POA: Diagnosis not present

## 2023-11-10 ENCOUNTER — Encounter: Payer: Self-pay | Admitting: Internal Medicine

## 2023-11-10 ENCOUNTER — Ambulatory Visit (INDEPENDENT_AMBULATORY_CARE_PROVIDER_SITE_OTHER): Payer: Medicare Other | Admitting: Internal Medicine

## 2023-11-10 VITALS — BP 124/76 | HR 130 | Temp 97.8°F | Ht 60.0 in | Wt 83.0 lb

## 2023-11-10 DIAGNOSIS — Z0000 Encounter for general adult medical examination without abnormal findings: Secondary | ICD-10-CM | POA: Diagnosis not present

## 2023-11-10 DIAGNOSIS — J4489 Other specified chronic obstructive pulmonary disease: Secondary | ICD-10-CM

## 2023-11-10 DIAGNOSIS — Z0001 Encounter for general adult medical examination with abnormal findings: Secondary | ICD-10-CM

## 2023-11-10 DIAGNOSIS — R350 Frequency of micturition: Secondary | ICD-10-CM

## 2023-11-10 DIAGNOSIS — E7800 Pure hypercholesterolemia, unspecified: Secondary | ICD-10-CM

## 2023-11-10 DIAGNOSIS — I10 Essential (primary) hypertension: Secondary | ICD-10-CM

## 2023-11-10 DIAGNOSIS — E559 Vitamin D deficiency, unspecified: Secondary | ICD-10-CM | POA: Diagnosis not present

## 2023-11-10 DIAGNOSIS — E538 Deficiency of other specified B group vitamins: Secondary | ICD-10-CM | POA: Diagnosis not present

## 2023-11-10 DIAGNOSIS — R7302 Impaired glucose tolerance (oral): Secondary | ICD-10-CM | POA: Diagnosis not present

## 2023-11-10 DIAGNOSIS — N1831 Chronic kidney disease, stage 3a: Secondary | ICD-10-CM | POA: Diagnosis not present

## 2023-11-10 LAB — LIPID PANEL
Cholesterol: 159 mg/dL (ref 0–200)
HDL: 47.3 mg/dL (ref >39.00)
LDL Cholesterol: 83 mg/dL (ref 0–99)
NonHDL: 111.53
Total CHOL/HDL Ratio: 3
Triglycerides: 142 mg/dL (ref 0.0–149.0)
VLDL: 28.4 mg/dL (ref 0.0–40.0)

## 2023-11-10 LAB — CBC WITH DIFFERENTIAL/PLATELET
Basophils Absolute: 0 10*3/uL (ref 0.0–0.1)
Basophils Relative: 0.4 % (ref 0.0–3.0)
Eosinophils Absolute: 0 10*3/uL (ref 0.0–0.7)
Eosinophils Relative: 0.4 % (ref 0.0–5.0)
HCT: 35.6 % — ABNORMAL LOW (ref 36.0–46.0)
Hemoglobin: 11.2 g/dL — ABNORMAL LOW (ref 12.0–15.0)
Lymphocytes Relative: 12.3 % (ref 12.0–46.0)
Lymphs Abs: 0.9 10*3/uL (ref 0.7–4.0)
MCHC: 31.5 g/dL (ref 30.0–36.0)
MCV: 83 fl (ref 78.0–100.0)
Monocytes Absolute: 0.4 10*3/uL (ref 0.1–1.0)
Monocytes Relative: 5.9 % (ref 3.0–12.0)
Neutro Abs: 6 10*3/uL (ref 1.4–7.7)
Neutrophils Relative %: 81 % — ABNORMAL HIGH (ref 43.0–77.0)
Platelets: 471 10*3/uL — ABNORMAL HIGH (ref 150.0–400.0)
RBC: 4.29 Mil/uL (ref 3.87–5.11)
RDW: 16.3 % — ABNORMAL HIGH (ref 11.5–15.5)
WBC: 7.5 10*3/uL (ref 4.0–10.5)

## 2023-11-10 LAB — BASIC METABOLIC PANEL WITH GFR
BUN: 67 mg/dL — ABNORMAL HIGH (ref 6–23)
CO2: 25 meq/L (ref 19–32)
Calcium: 9.6 mg/dL (ref 8.4–10.5)
Chloride: 100 meq/L (ref 96–112)
Creatinine, Ser: 1.52 mg/dL — ABNORMAL HIGH (ref 0.40–1.20)
GFR: 31.57 mL/min — ABNORMAL LOW (ref >60.00)
Glucose, Bld: 142 mg/dL — ABNORMAL HIGH (ref 70–99)
Potassium: 5 meq/L (ref 3.5–5.1)
Sodium: 134 meq/L — ABNORMAL LOW (ref 135–145)

## 2023-11-10 LAB — VITAMIN D 25 HYDROXY (VIT D DEFICIENCY, FRACTURES): VITD: 58.74 ng/mL (ref 30.00–100.00)

## 2023-11-10 LAB — HEPATIC FUNCTION PANEL
ALT: 25 U/L (ref 0–35)
AST: 25 U/L (ref 0–37)
Albumin: 3.7 g/dL (ref 3.5–5.2)
Alkaline Phosphatase: 98 U/L (ref 39–117)
Bilirubin, Direct: 0.1 mg/dL (ref 0.0–0.3)
Total Bilirubin: 0.2 mg/dL (ref 0.2–1.2)
Total Protein: 7.9 g/dL (ref 6.0–8.3)

## 2023-11-10 LAB — VITAMIN B12: Vitamin B-12: 324 pg/mL (ref 211–911)

## 2023-11-10 LAB — TSH: TSH: 0.63 u[IU]/mL (ref 0.35–5.50)

## 2023-11-10 LAB — HEMOGLOBIN A1C: Hgb A1c MFr Bld: 6.5 % (ref 4.6–6.5)

## 2023-11-10 MED ORDER — TRELEGY ELLIPTA 100-62.5-25 MCG/ACT IN AEPB
1.0000 | INHALATION_SPRAY | Freq: Every day | RESPIRATORY_TRACT | 11 refills | Status: DC
Start: 2023-11-10 — End: 2024-01-04

## 2023-11-10 MED ORDER — ALPRAZOLAM 0.25 MG PO TABS: ORAL_TABLET | ORAL | 5 refills | Status: DC

## 2023-11-10 MED ORDER — HYDROCODONE-ACETAMINOPHEN 5-325 MG PO TABS: 1.0000 | ORAL_TABLET | Freq: Two times a day (BID) | ORAL | 0 refills | Status: DC | PRN

## 2023-11-10 MED ORDER — TRIMETHOPRIM 100 MG PO TABS: 100.0000 mg | ORAL_TABLET | Freq: Every day | ORAL | 3 refills | Status: DC

## 2023-11-10 MED ORDER — CYCLOBENZAPRINE HCL 5 MG PO TABS: ORAL_TABLET | ORAL | 3 refills | Status: DC

## 2023-11-10 MED ORDER — LOSARTAN POTASSIUM 100 MG PO TABS: 100.0000 mg | ORAL_TABLET | Freq: Every day | ORAL | 3 refills | Status: DC

## 2023-11-10 NOTE — Progress Notes (Signed)
 Patient ID: Tammy Wolfe, female   DOB: [birth year], 83 y.o.   MRN: [id number]         Chief Complaint:: wellness exam and copd, low b12, urinary frequency, ckd3a, hld       HPI:  Tammy Wolfe is a 83 y.o. female here for wellness exam; for shingrix at pharmacy, declines tdap, o/w up to date                        Also does the pedal machine at home for 1 hr per day with resolved leg cramps.  Does not want LE arterial dopplers now.   has new great grandson.    Has been trying to eat more protein, and has lost several lbs, and wondering about pulm cachexia as well. Pt denies chest pain, wheezing, orthopnea, PND, increased LE swelling, palpitations, dizziness or syncope but has mild worsening sob not so well controlled with albuterol . Has mild urinary frequency but Denies urinary symptoms such as dysuria, urgency, flank pain, hematuria or n/v, fever, chills.. Does not want statin treatment.    Wt Readings from Last 3 Encounters:  11/10/23 83 lb (37.6 kg)  05/12/23 89 lb (40.4 kg)  11/10/22 93 lb (42.2 kg)   BP Readings from Last 3 Encounters:  11/10/23 124/76  05/12/23 126/82  11/10/22 120/72   Immunization History  Administered Date(s) Administered   Fluad Quad(high Dose 65+) 04/05/2019, 04/25/2020, 05/01/2021, 05/06/2022, 02/24/2023   Influenza Whole 03/19/2007   Influenza, High Dose Seasonal PF 03/08/2013, 03/07/2014, 03/14/2015, 03/26/2017, 03/30/2018   Influenza,inj,Quad PF,6+ Mos 03/12/2016   PFIZER(Purple Top)SARS-COV-2 Vaccination 07/24/2019, 08/17/2019, 03/19/2020, 02/24/2023   Pneumococcal Conjugate-13 03/29/2013   Pneumococcal Polysaccharide-23 01/21/2006   Td 11/12/2009   Zoster, Live 01/21/2006   Health Maintenance Due  Topic Date Due   Zoster Vaccines- Shingrix (1 of 2) 07/26/1959      Past Medical History:  Diagnosis Date   ABNORMAL THYROID  FUNCTION TESTS 12/10/2009   ALCOHOLIC HEPATITIS, HX OF 02/14/2007   ALLERGIC RHINITIS 02/14/2007   Anemia, unspecified  04/26/2012   ANXIETY 03/19/2007   Breast cancer (HCC)    Breast implant removal status 12/23/2008   COLONIC POLYPS, HX OF 02/14/2007   COPD (chronic obstructive pulmonary disease) (HCC)    Coronary artery calcification seen on CT scan 10/31/2013   GOITER, MULTINODULAR 02/14/2007   HX, PERSONAL, ALCOHOLISM 02/14/2007   HYPERLIPIDEMIA 02/14/2007   HYPERTENSION 02/14/2007   Insomnia    OSTEOARTHRITIS 02/14/2007   OSTEOPOROSIS 02/14/2007   PANCREATITIS, HX OF 02/14/2007   Smoker 09/06/2013   Past Surgical History:  Procedure Laterality Date   BREAST SURGERY  2010   silicone prepectoral implants explanted by Dr. Lindle Rhea after rupture   BREAST SURGERY     left simple mastectomy and left axillary sentinel node biopsy, right prophylactic mastectomy   CATARACT EXTRACTION     NASAL SINUS SURGERY Left 06/14/2021   Procedure: ENDOSCOPIC MAXILLARY ANTROSTOMY WITH REMOVAL OF TISSUE;  Surgeon: Janita Mellow, MD;  Location: [facility] OR;  Service: ENT;  Laterality: Left;   peridontal  2010   s/p jaw and periodontal surgury  2009    reports that she quit smoking about 2 years ago. Her smoking use included cigarettes. She has never used smokeless tobacco. She reports that she does not currently use alcohol. She reports that she does not use drugs. family history includes Hyperlipidemia in her brother. She was adopted. Allergies  Allergen Reactions   Tetracycline Anaphylaxis   Lovastatin  Other (  See Comments)    Muscle cramps   Pravastatin  Other (See Comments)    Leg cramps   Current Outpatient Medications on File Prior to Visit  Medication Sig Dispense Refill   acetaminophen  (TYLENOL ) 325 MG tablet Take 650 mg by mouth every 6 (six) hours as needed.     Cholecalciferol (VITAMIN D ) 1000 UNITS capsule Take 1,000 Units by mouth daily.       cyanocobalamin  1000 MCG tablet Take 1,000 mcg by mouth daily.     oxymetazoline  (AFRIN) 0.05 % nasal spray Place 1 spray into both nostrils daily as needed for  congestion.     No current facility-administered medications on file prior to visit.        ROS:  All others reviewed and negative.  Objective        PE:  BP 124/76 (BP Location: Right Arm, Patient Position: Sitting, Cuff Size: Normal)   Pulse (!) 130   Temp 97.8 F (36.6 C) (Oral)   Ht 5' (1.524 m)   Wt 83 lb (37.6 kg)   SpO2 95%   BMI 16.21 kg/m                 Constitutional: Pt appears in NAD               HENT: Head: NCAT.                Right Ear: External ear normal.                 Left Ear: External ear normal.                Eyes: . Pupils are equal, round, and reactive to light. Conjunctivae and EOM are normal               Nose: without d/c or deformity               Neck: Neck supple. Gross normal ROM               Cardiovascular: Normal rate and regular rhythm.                 Pulmonary/Chest: Effort normal and breath sounds without rales or wheezing.                Abd:  Soft, NT, ND, + BS, no organomegaly               Neurological: Pt is alert. At baseline orientation, motor grossly intact               Skin: Skin is warm. No rashes, no other new lesions, LE edema - none               Psychiatric: Pt behavior is normal without agitation   Micro: none  Cardiac tracings I have personally interpreted today:  none  Pertinent Radiological findings (summarize): none   Lab Results  Component Value Date   WBC 7.5 11/10/2023   HGB 11.2 (L) 11/10/2023   HCT 35.6 (L) 11/10/2023   PLT 471.0 (H) 11/10/2023   GLUCOSE 142 (H) 11/10/2023   CHOL 159 11/10/2023   TRIG 142.0 11/10/2023   HDL 47.30 11/10/2023   LDLCALC 83 11/10/2023   ALT 25 11/10/2023   AST 25 11/10/2023   NA 134 (L) 11/10/2023   K 5.0 11/10/2023   CL 100 11/10/2023   CREATININE 1.52 (H) 11/10/2023   BUN 67 (H) 11/10/2023   CO2 25  11/10/2023   TSH 0.63 11/10/2023   HGBA1C 6.5 11/10/2023   Assessment/Plan:  Tammy Wolfe is a 83 y.o. White or Caucasian [1] female with  has a past medical  history of ABNORMAL THYROID  FUNCTION TESTS (12/10/2009), ALCOHOLIC HEPATITIS, HX OF (02/14/2007), ALLERGIC RHINITIS (02/14/2007), Anemia, unspecified (04/26/2012), ANXIETY (03/19/2007), Breast cancer (HCC), Breast implant removal status (12/23/2008), COLONIC POLYPS, HX OF (02/14/2007), COPD (chronic obstructive pulmonary disease) (HCC), Coronary artery calcification seen on CT scan (10/31/2013), GOITER, MULTINODULAR (02/14/2007), HX, PERSONAL, ALCOHOLISM (02/14/2007), HYPERLIPIDEMIA (02/14/2007), HYPERTENSION (02/14/2007), Insomnia, OSTEOARTHRITIS (02/14/2007), OSTEOPOROSIS (02/14/2007), PANCREATITIS, HX OF (02/14/2007), and Smoker (09/06/2013).  COPD (chronic obstructive pulmonary disease) with chronic bronchitis Mild worsening, for start trelegy 1 qd  Encounter for well adult exam with abnormal findings Age and sex appropriate education and counseling updated with regular exercise and diet Referrals for preventative services - none needed Immunizations addressed - for shingrix at pharmacy, declines tdap Smoking counseling  - none needed Evidence for depression or other mood disorder - none significant Most recent labs reviewed. I have personally reviewed and have noted: 1) the patient's medical and social history 2) The patient's current medications and supplements 3) The patient's height, weight, and BMI have been recorded in the chart   B12 deficiency Lab Results  Component Value Date   VITAMINB12 324 11/10/2023   Stable, cont oral replacement - b12 1000 mcg qd   Essential hypertension BP Readings from Last 3 Encounters:  11/10/23 124/76  05/12/23 126/82  11/10/22 120/72   Stable, pt to continue medical treatment losartan  100 qd   Hyperlipidemia Lab Results  Component Value Date   LDLCALC 83 11/10/2023   uncontrolled, pt to continue low chol diet, declines statin or zetia for now  Impaired glucose tolerance Lab Results  Component Value Date   HGBA1C 6.5 11/10/2023    Stable, pt to continue current medical treatment  - diet, wt control   Vitamin D  deficiency Last vitamin D  Lab Results  Component Value Date   VD25OH 58.74 11/10/2023   Stable, cont oral replacement   Urinary frequency Also for urine culture  CKD (chronic kidney disease) stage 3, GFR 30-59 ml/min (HCC) For f/u lab, consider renal referral Followup: Return in about 6 months (around 05/11/2024).  Rosalia Colonel, MD 11/11/2023 9:04 PM [facility] [place] [facility] - [facility]

## 2023-11-10 NOTE — Patient Instructions (Addendum)
 Please have your Shingrix (shingles) shots done at your local pharmacy.  Please take all new medication as prescribed - the Trelegy inhaler  Please continue all other medications as before, and refills have been done   Please have the pharmacy call with any other refills you may need.  Please continue your efforts at being more active, low cholesterol diet, and weight control.  You are otherwise up to date with prevention measures today.  Please keep your appointments with your specialists as you may have planned  Please go to the LAB at the blood drawing area for the tests to be done  You will be contacted by phone if any changes need to be made immediately.  Otherwise, you will receive a letter about your results with an explanation, but please check with MyChart first.  Please make an Appointment to return in 6 months, or sooner if needed, also with Lab Appointment for testing done 3-5 days before at the FIRST FLOOR Lab (so this is for TWO appointments - please see the scheduling desk as you leave)

## 2023-11-11 ENCOUNTER — Encounter: Payer: Self-pay | Admitting: Internal Medicine

## 2023-11-11 ENCOUNTER — Other Ambulatory Visit: Payer: Self-pay | Admitting: Internal Medicine

## 2023-11-11 ENCOUNTER — Ambulatory Visit: Payer: Self-pay | Admitting: Internal Medicine

## 2023-11-11 DIAGNOSIS — N1832 Chronic kidney disease, stage 3b: Secondary | ICD-10-CM

## 2023-11-11 DIAGNOSIS — R350 Frequency of micturition: Secondary | ICD-10-CM | POA: Insufficient documentation

## 2023-11-11 LAB — URINALYSIS, ROUTINE W REFLEX MICROSCOPIC
Bilirubin Urine: NEGATIVE
Ketones, ur: NEGATIVE
Leukocytes,Ua: NEGATIVE
Nitrite: NEGATIVE
Specific Gravity, Urine: 1.01 (ref 1.000–1.030)
Total Protein, Urine: 100 — AB
Urine Glucose: NEGATIVE
Urobilinogen, UA: 0.2 (ref 0.0–1.0)
pH: 6 (ref 5.0–8.0)

## 2023-11-11 LAB — URINE CULTURE: Result:: NO GROWTH

## 2023-11-11 NOTE — Assessment & Plan Note (Signed)
 Mild worsening, for start trelegy 1 qd

## 2023-11-11 NOTE — Assessment & Plan Note (Signed)
 Lab Results  Component Value Date   LDLCALC 83 11/10/2023   uncontrolled, pt to continue low chol diet, declines statin or zetia for now

## 2023-11-11 NOTE — Assessment & Plan Note (Signed)
Also for urine culture °

## 2023-11-11 NOTE — Assessment & Plan Note (Signed)
 Lab Results  Component Value Date   VITAMINB12 324 11/10/2023   Stable, cont oral replacement - b12 1000 mcg qd

## 2023-11-11 NOTE — Assessment & Plan Note (Signed)
 Age and sex appropriate education and counseling updated with regular exercise and diet Referrals for preventative services - none needed Immunizations addressed - for shingrix at pharmacy, declines tdap Smoking counseling  - none needed Evidence for depression or other mood disorder - none significant Most recent labs reviewed. I have personally reviewed and have noted: 1) the patient's medical and social history 2) The patient's current medications and supplements 3) The patient's height, weight, and BMI have been recorded in the chart

## 2023-11-11 NOTE — Addendum Note (Signed)
 Addended by: Roslyn Coombe on: 11/11/2023 09:07 PM   Modules accepted: Orders

## 2023-11-11 NOTE — Assessment & Plan Note (Signed)
 For f/u lab, consider renal referral

## 2023-11-11 NOTE — Assessment & Plan Note (Signed)
 BP Readings from Last 3 Encounters:  11/10/23 124/76  05/12/23 126/82  11/10/22 120/72   Stable, pt to continue medical treatment losartan  100 qd

## 2023-11-11 NOTE — Assessment & Plan Note (Signed)
 Last vitamin D  Lab Results  Component Value Date   VD25OH 58.74 11/10/2023   Stable, cont oral replacement

## 2023-11-11 NOTE — Assessment & Plan Note (Signed)
 Lab Results  Component Value Date   HGBA1C 6.5 11/10/2023   Stable, pt to continue current medical treatment  - diet, wt control

## 2023-11-12 NOTE — Telephone Encounter (Signed)
 Patient is calling in regarding a call from the office she would like a call back

## 2023-11-23 ENCOUNTER — Ambulatory Visit (INDEPENDENT_AMBULATORY_CARE_PROVIDER_SITE_OTHER): Admitting: Internal Medicine

## 2023-11-23 ENCOUNTER — Encounter: Payer: Self-pay | Admitting: Internal Medicine

## 2023-11-23 ENCOUNTER — Ambulatory Visit (INDEPENDENT_AMBULATORY_CARE_PROVIDER_SITE_OTHER)

## 2023-11-23 ENCOUNTER — Ambulatory Visit: Payer: Self-pay

## 2023-11-23 VITALS — BP 122/70 | HR 53 | Temp 98.1°F | Ht 60.0 in | Wt 80.0 lb

## 2023-11-23 DIAGNOSIS — J441 Chronic obstructive pulmonary disease with (acute) exacerbation: Secondary | ICD-10-CM | POA: Diagnosis not present

## 2023-11-23 DIAGNOSIS — R0609 Other forms of dyspnea: Secondary | ICD-10-CM

## 2023-11-23 DIAGNOSIS — N1831 Chronic kidney disease, stage 3a: Secondary | ICD-10-CM

## 2023-11-23 DIAGNOSIS — E559 Vitamin D deficiency, unspecified: Secondary | ICD-10-CM

## 2023-11-23 DIAGNOSIS — E538 Deficiency of other specified B group vitamins: Secondary | ICD-10-CM | POA: Diagnosis not present

## 2023-11-23 DIAGNOSIS — E7800 Pure hypercholesterolemia, unspecified: Secondary | ICD-10-CM

## 2023-11-23 DIAGNOSIS — R7302 Impaired glucose tolerance (oral): Secondary | ICD-10-CM

## 2023-11-23 LAB — CBC WITH DIFFERENTIAL/PLATELET
Basophils Absolute: 0 10*3/uL (ref 0.0–0.1)
Basophils Relative: 0.3 % (ref 0.0–3.0)
Eosinophils Absolute: 0 10*3/uL (ref 0.0–0.7)
Eosinophils Relative: 0.2 % (ref 0.0–5.0)
HCT: 35.8 % — ABNORMAL LOW (ref 36.0–46.0)
Hemoglobin: 11.5 g/dL — ABNORMAL LOW (ref 12.0–15.0)
Lymphocytes Relative: 9.7 % — ABNORMAL LOW (ref 12.0–46.0)
Lymphs Abs: 0.9 10*3/uL (ref 0.7–4.0)
MCHC: 32 g/dL (ref 30.0–36.0)
MCV: 82 fl (ref 78.0–100.0)
Monocytes Absolute: 0.6 10*3/uL (ref 0.1–1.0)
Monocytes Relative: 5.9 % (ref 3.0–12.0)
Neutro Abs: 8 10*3/uL — ABNORMAL HIGH (ref 1.4–7.7)
Neutrophils Relative %: 83.9 % — ABNORMAL HIGH (ref 43.0–77.0)
Platelets: 476 10*3/uL — ABNORMAL HIGH (ref 150.0–400.0)
RBC: 4.37 Mil/uL (ref 3.87–5.11)
RDW: 16.1 % — ABNORMAL HIGH (ref 11.5–15.5)
WBC: 9.5 10*3/uL (ref 4.0–10.5)

## 2023-11-23 LAB — HEPATIC FUNCTION PANEL
ALT: 231 U/L — ABNORMAL HIGH (ref 0–35)
AST: 81 U/L — ABNORMAL HIGH (ref 0–37)
Albumin: 3.7 g/dL (ref 3.5–5.2)
Alkaline Phosphatase: 692 U/L — ABNORMAL HIGH (ref 39–117)
Bilirubin, Direct: 0.2 mg/dL (ref 0.0–0.3)
Total Bilirubin: 0.5 mg/dL (ref 0.2–1.2)
Total Protein: 8 g/dL (ref 6.0–8.3)

## 2023-11-23 LAB — BASIC METABOLIC PANEL WITH GFR
BUN: 73 mg/dL — ABNORMAL HIGH (ref 6–23)
CO2: 21 meq/L (ref 19–32)
Calcium: 9.8 mg/dL (ref 8.4–10.5)
Chloride: 97 meq/L (ref 96–112)
Creatinine, Ser: 1.53 mg/dL — ABNORMAL HIGH (ref 0.40–1.20)
GFR: 31.31 mL/min — ABNORMAL LOW (ref >60.00)
Glucose, Bld: 145 mg/dL — ABNORMAL HIGH (ref 70–99)
Potassium: 4.6 meq/L (ref 3.5–5.1)
Sodium: 128 meq/L — ABNORMAL LOW (ref 135–145)

## 2023-11-23 LAB — HEMOGLOBIN A1C: Hgb A1c MFr Bld: 6.5 % (ref 4.6–6.5)

## 2023-11-23 LAB — LIPID PANEL
Cholesterol: 186 mg/dL (ref 0–200)
HDL: 51.7 mg/dL (ref >39.00)
LDL Cholesterol: 98 mg/dL (ref 0–99)
NonHDL: 134.44
Total CHOL/HDL Ratio: 4
Triglycerides: 183 mg/dL — ABNORMAL HIGH (ref 0.0–149.0)
VLDL: 36.6 mg/dL (ref 0.0–40.0)

## 2023-11-23 LAB — VITAMIN B12: Vitamin B-12: 851 pg/mL (ref 211–911)

## 2023-11-23 LAB — VITAMIN D 25 HYDROXY (VIT D DEFICIENCY, FRACTURES): VITD: 71.41 ng/mL (ref 30.00–100.00)

## 2023-11-23 LAB — BRAIN NATRIURETIC PEPTIDE: Pro B Natriuretic peptide (BNP): 45 pg/mL (ref 0.0–100.0)

## 2023-11-23 MED ORDER — ALBUTEROL SULFATE HFA 108 (90 BASE) MCG/ACT IN AERS: 2.0000 | INHALATION_SPRAY | Freq: Four times a day (QID) | RESPIRATORY_TRACT | 5 refills | Status: DC | PRN

## 2023-11-23 MED ORDER — PREDNISONE 10 MG PO TABS: ORAL_TABLET | ORAL | 0 refills | Status: DC

## 2023-11-23 MED ORDER — AZITHROMYCIN 250 MG PO TABS: ORAL_TABLET | ORAL | 1 refills | Status: DC

## 2023-11-23 MED ORDER — HYDROCODONE BIT-HOMATROP MBR 5-1.5 MG/5ML PO SOLN: 5.0000 mL | Freq: Four times a day (QID) | ORAL | 0 refills | Status: DC | PRN

## 2023-11-23 MED ORDER — METHYLPREDNISOLONE ACETATE 40 MG/ML IJ SUSP
40.0000 mg | Freq: Once | INTRAMUSCULAR | Status: AC
  Administered 2023-11-23: 40 mg via INTRAMUSCULAR

## 2023-11-23 NOTE — Assessment & Plan Note (Signed)
 Lab Results  Component Value Date   VITAMINB12 851 11/23/2023   Stable, cont oral replacement - b12 1000 mcg qd

## 2023-11-23 NOTE — Progress Notes (Signed)
 Patient ID: Tammy Wolfe, female   DOB: 06/14/1940, 83 y.o.   MRN: [id number]        Chief Complaint: follow up copd exacerbation with doe, ckd3b, hyperglycemia, hld       HPI:  Tammy Wolfe is a 83 y.o. female here with above x 1 wk worsening feverish, prod cough yellow greenish sputum with wheezing, sob doe, also ST, HA, general weakness and malaise, but Pt denies chest pain, orthopnea, PND, increased LE swelling, palpitations, dizziness or syncope.   Pt denies polydipsia, polyuria, or new focal neuro s/s.    Pt denies fever, wt loss, night sweats, loss of appetite, or other constitutional symptoms         Wt Readings from Last 3 Encounters:  11/23/23 80 lb (36.3 kg)  11/10/23 83 lb (37.6 kg)  05/12/23 89 lb (40.4 kg)   BP Readings from Last 3 Encounters:  11/23/23 122/70  11/10/23 124/76  05/12/23 126/82         Past Medical History:  Diagnosis Date   ABNORMAL THYROID  FUNCTION TESTS 12/10/2009   ALCOHOLIC HEPATITIS, HX OF 02/14/2007   ALLERGIC RHINITIS 02/14/2007   Anemia, unspecified 04/26/2012   ANXIETY 03/19/2007   Breast cancer (HCC)    Breast implant removal status 12/23/2008   COLONIC POLYPS, HX OF 02/14/2007   COPD (chronic obstructive pulmonary disease) (HCC)    Coronary artery calcification seen on CT scan 10/31/2013   GOITER, MULTINODULAR 02/14/2007   HX, PERSONAL, ALCOHOLISM 02/14/2007   HYPERLIPIDEMIA 02/14/2007   HYPERTENSION 02/14/2007   Insomnia    OSTEOARTHRITIS 02/14/2007   OSTEOPOROSIS 02/14/2007   PANCREATITIS, HX OF 02/14/2007   Smoker 09/06/2013   Past Surgical History:  Procedure Laterality Date   BREAST SURGERY  2010   silicone prepectoral implants explanted by Dr. Marcus after rupture   BREAST SURGERY     left simple mastectomy and left axillary sentinel node biopsy, right prophylactic mastectomy   CATARACT EXTRACTION     NASAL SINUS SURGERY Left 06/14/2021   Procedure: ENDOSCOPIC MAXILLARY ANTROSTOMY WITH REMOVAL OF TISSUE;  Surgeon: Jesus Oliphant, MD;  Location: [facility] OR;  Service: ENT;  Laterality: Left;   peridontal  2010   s/p jaw and periodontal surgury  2009    reports that she quit smoking about 2 years ago. Her smoking use included cigarettes. She has never used smokeless tobacco. She reports that she does not currently use alcohol. She reports that she does not use drugs. family history includes Hyperlipidemia in her brother. She was adopted. Allergies  Allergen Reactions   Tetracycline Anaphylaxis   Lovastatin  Other (See Comments)    Muscle cramps   Pravastatin  Other (See Comments)    Leg cramps   Current Outpatient Medications on File Prior to Visit  Medication Sig Dispense Refill   acetaminophen  (TYLENOL ) 325 MG tablet Take 650 mg by mouth every 6 (six) hours as needed.     ALPRAZolam  (XANAX ) 0.25 MG tablet TAKE ONE TABLET THREE TIMES DAILY AS NEEDED 90 tablet 5   Cholecalciferol (VITAMIN D ) 1000 UNITS capsule Take 1,000 Units by mouth daily.       cyanocobalamin  1000 MCG tablet Take 1,000 mcg by mouth daily.     cyclobenzaprine  (FLEXERIL ) 5 MG tablet 1 tab by mouth at bedtime as needed for cramps and back pain 90 tablet 3   Fluticasone-Umeclidin-Vilant (TRELEGY ELLIPTA ) 100-62.5-25 MCG/ACT AEPB Inhale 1 puff into the lungs daily. 1 each 11   HYDROcodone -acetaminophen  (NORCO/VICODIN) 5-325 MG tablet Take 1 tablet  by mouth 2 (two) times daily as needed. 100 tablet 0   losartan  (COZAAR ) 100 MG tablet Take 1 tablet (100 mg total) by mouth daily. 90 tablet 3   oxymetazoline  (AFRIN) 0.05 % nasal spray Place 1 spray into both nostrils daily as needed for congestion.     trimethoprim  (TRIMPEX ) 100 MG tablet Take 1 tablet (100 mg total) by mouth daily. 90 tablet 3   No current facility-administered medications on file prior to visit.        ROS:  All others reviewed and negative.  Objective        PE:  BP 122/70 (BP Location: Right Arm, Patient Position: Sitting, Cuff Size: Normal)   Pulse (!) 53   Temp 98.1 F (36.7  C) (Oral)   Ht 5' (1.524 m)   Wt 80 lb (36.3 kg)   SpO2 93%   BMI 15.62 kg/m                 Constitutional: Pt appears mild ill               HENT: Head: NCAT.                Right Ear: External ear normal.                 Left Ear: External ear normal. Bilat tm's with mild erythema.  Max sinus areas non tender.  Pharynx with mild erythema, no exudate               Eyes: . Pupils are equal, round, and reactive to light. Conjunctivae and EOM are normal               Nose: without d/c or deformity               Neck: Neck supple. Gross normal ROM               Cardiovascular: Normal rate and regular rhythm.                 Pulmonary/Chest: Effort normal and breath sounds decreased without rales but with few bilatearl wheezing.                              Neurological: Pt is alert. At baseline orientation, motor grossly intact               Skin: Skin is warm. No rashes, no other new lesions, LE edema - none               Psychiatric: Pt behavior is normal without agitation   Micro: none  Cardiac tracings I have personally interpreted today:  none  Pertinent Radiological findings (summarize): none   Lab Results  Component Value Date   WBC 9.5 11/23/2023   HGB 11.5 (L) 11/23/2023   HCT 35.8 (L) 11/23/2023   PLT 476.0 (H) 11/23/2023   GLUCOSE 145 (H) 11/23/2023   CHOL 186 11/23/2023   TRIG 183.0 (H) 11/23/2023   HDL 51.70 11/23/2023   LDLCALC 98 11/23/2023   ALT 231 (H) 11/23/2023   AST 81 (H) 11/23/2023   NA 128 (L) 11/23/2023   K 4.6 11/23/2023   CL 97 11/23/2023   CREATININE 1.53 (H) 11/23/2023   BUN 73 (H) 11/23/2023   CO2 21 11/23/2023   TSH 0.63 11/10/2023   HGBA1C 6.5 11/23/2023   Assessment/Plan:  Tammy Wolfe is a 83 y.o.  White or Caucasian [1] female with  has a past medical history of ABNORMAL THYROID  FUNCTION TESTS (12/10/2009), ALCOHOLIC HEPATITIS, HX OF (02/14/2007), ALLERGIC RHINITIS (02/14/2007), Anemia, unspecified (04/26/2012), ANXIETY (03/19/2007),  Breast cancer (HCC), Breast implant removal status (12/23/2008), COLONIC POLYPS, HX OF (02/14/2007), COPD (chronic obstructive pulmonary disease) (HCC), Coronary artery calcification seen on CT scan (10/31/2013), GOITER, MULTINODULAR (02/14/2007), HX, PERSONAL, ALCOHOLISM (02/14/2007), HYPERLIPIDEMIA (02/14/2007), HYPERTENSION (02/14/2007), Insomnia, OSTEOARTHRITIS (02/14/2007), OSTEOPOROSIS (02/14/2007), PANCREATITIS, HX OF (02/14/2007), and Smoker (09/06/2013).  Impaired glucose tolerance Lab Results  Component Value Date   HGBA1C 6.5 11/23/2023   Stable, pt to continue current medical treatment  - diet, wt control   Hyperlipidemia Lab Results  Component Value Date   LDLCALC 98 11/23/2023   Stable, pt to continue low chol diet   B12 deficiency Lab Results  Component Value Date   VITAMINB12 851 11/23/2023   Stable, cont oral replacement - b12 1000 mcg qd   Vitamin D  deficiency Last vitamin D  Lab Results  Component Value Date   VD25OH 71.41 11/23/2023   Stable, cont oral replacement   COPD exacerbation (HCC) Mild to mod, for antibx course zpack, prednisone taper, albuterol  inhaler prn, cough medicinea, and continue the trelegy,  to f/u any worsening symptoms or concerns, also for cxr, and lab including bnp and d dimer but seem less likely  CKD (chronic kidney disease) stage 3, GFR 30-59 ml/min (HCC) With mild recent worsening, pt ok now for renal referral  Lab Results  Component Value Date   CREATININE 1.53 (H) 11/23/2023     Followup: Return if symptoms worsen or fail to improve.  Lynwood Rush, MD 11/23/2023 7:34 PM [facility]

## 2023-11-23 NOTE — Assessment & Plan Note (Signed)
 Last vitamin D  Lab Results  Component Value Date   VD25OH 71.41 11/23/2023   Stable, cont oral replacement

## 2023-11-23 NOTE — Assessment & Plan Note (Signed)
 Lab Results  Component Value Date   LDLCALC 98 11/23/2023   Stable, pt to continue low chol diet

## 2023-11-23 NOTE — Patient Instructions (Addendum)
 You had the steroid shot today  Please take all new medication as prescribed  - the antibiotic, cough medicine, prednisone, and albuterol  inhaler as needed  Please continue all other medications as before, and refills have been done if requested.  Please have the pharmacy call with any other refills you may need.  Please keep your appointments with your specialists as you may have planned - Renal when you are called  Please go to the XRAY Department in the first floor for the x-ray testing  Please go to the LAB at the blood drawing area for the tests to be done  You will be contacted by phone if any changes need to be made immediately.  Otherwise, you will receive a letter about your results with an explanation, but please check with MyChart first.

## 2023-11-23 NOTE — Assessment & Plan Note (Addendum)
 Mild to mod, for antibx course zpack, prednisone taper, albuterol  inhaler prn, cough medicinea, and continue the trelegy,  to f/u any worsening symptoms or concerns, also for cxr, and lab including bnp and d dimer but seem less likely; also for Depomedrol 80 mg IM today

## 2023-11-23 NOTE — Telephone Encounter (Signed)
 FYI Only or Action Required?: FYI only for provider.  Patient was last seen in primary care on 11/10/2023 by [name] ORN, MD. Called Nurse Triage reporting Shortness of Breath. Symptoms began a week ago. Interventions attempted: Nothing. Symptoms are: unchanged.  Triage Disposition: See HCP Within 4 Hours (Or PCP Triage)  Patient/caregiver understands and will follow disposition?: Yes       Copied from CRM [id number]. Topic: Clinical - Red Word Triage >> Nov 23, 2023  8:00 AM Mercedes MATSU wrote: Red Word that prompted transfer to Nurse Triage: Patient called in stating that she is having trouble breathing when she speaks. Reason for Disposition  [1] Longstanding difficulty breathing (e.g., CHF, COPD, emphysema) AND [2] WORSE than normal  Answer Assessment - Initial Assessment Questions 1. RESPIRATORY STATUS: Describe your breathing? (e.g., wheezing, shortness of breath, unable to speak, severe coughing)      Was seen on the 17 th by Dr. Norleen, states hx COPD; states lost voice; states it's difficult to speak due to sob 2. ONSET: When did this breathing problem begin?     Started last Sunday 3. PATTERN Does the difficult breathing come and go, or has it been constant since it started?      Comes and goes 4. SEVERITY: How bad is your breathing? (e.g., mild, moderate, severe)    - MILD: No SOB at rest, mild SOB with walking, speaks normally in sentences, can lie down, no retractions, pulse < 100.    - MODERATE: SOB at rest, SOB with minimal exertion and prefers to sit, cannot lie down flat, speaks in phrases, mild retractions, audible wheezing, pulse 100-120.    - SEVERE: Very SOB at rest, speaks in single words, struggling to breathe, sitting hunched forward, retractions, pulse > 120      Moderate to severe 5. RECURRENT SYMPTOM: Have you had difficulty breathing before? If Yes, ask: When was the last time? and What happened that time?      Yes, copd 6. CARDIAC HISTORY: Do you  have any history of heart disease? (e.g., heart attack, angina, bypass surgery, angioplasty)      denies 7. LUNG HISTORY: Do you have any history of lung disease?  (e.g., pulmonary embolus, asthma, emphysema)      copd 8. CAUSE: What do you think is causing the breathing problem?      no 9. OTHER SYMPTOMS: Do you have any other symptoms? (e.g., dizziness, runny nose, cough, chest pain, fever)     no 10. O2 SATURATION MONITOR:  Do you use an oxygen saturation monitor (pulse oximeter) at home? If Yes, ask: What is your reading (oxygen level) today? What is your usual oxygen saturation reading? (e.g., 95%)       na 11. PREGNANCY: Is there any chance you are pregnant? When was your last menstrual period?       na 12. TRAVEL: Have you traveled out of the country in the last month? (e.g., travel history, exposures)       no  Protocols used: Breathing Difficulty-A-AH

## 2023-11-23 NOTE — Assessment & Plan Note (Signed)
 Lab Results  Component Value Date   HGBA1C 6.5 11/23/2023   Stable, pt to continue current medical treatment  - diet, wt control

## 2023-11-23 NOTE — Assessment & Plan Note (Addendum)
 With mild recent worsening, pt ok now for renal referral  Lab Results  Component Value Date   CREATININE 1.53 (H) 11/23/2023

## 2023-11-24 ENCOUNTER — Other Ambulatory Visit: Payer: Self-pay | Admitting: Internal Medicine

## 2023-11-24 DIAGNOSIS — R911 Solitary pulmonary nodule: Secondary | ICD-10-CM

## 2023-11-24 DIAGNOSIS — R748 Abnormal levels of other serum enzymes: Secondary | ICD-10-CM

## 2023-11-24 DIAGNOSIS — R7989 Other specified abnormal findings of blood chemistry: Secondary | ICD-10-CM

## 2023-11-24 LAB — D-DIMER, QUANTITATIVE: D-Dimer, Quant: 1.15 ug{FEU}/mL — ABNORMAL HIGH (ref <0.50)

## 2023-11-25 ENCOUNTER — Ambulatory Visit: Payer: Self-pay

## 2023-11-25 NOTE — Telephone Encounter (Signed)
  FYI Only or Action Required?: FYI only for provider.  Patient was last seen in primary care on 11/23/2023 by [name] ORN, MD. Called Nurse Triage reporting No chief complaint on file.. Symptoms began today. Interventions attempted: Nothing. Symptoms are: stable.  Triage Disposition: Information or Advice Only Call See note, just fyi  Patient/caregiver understands and will follow disposition?: yes      Copied from CRM [id number]. Topic: Clinical - Medical Advice >> Nov 25, 2023  1:06 PM Leah C wrote: Reason for CRM: Patient called in and would like to know why she is getting a CT scan tomorrow and would like to know what the test is and what to expect. Reason for Disposition  Health Information question, no triage required and triager able to answer question  Answer Assessment - Initial Assessment Questions 1. REASON FOR CALL or QUESTION:  Patient called regarding CT scan. Patient was unaware that she needed one.    This nurse passed on Dr. Naomi message regarding pt's D-dimer and r/o a Blood clot in her lungs.   Also relayed that she will be getting a call regarding a Liver Ultrasound as well, per Dr. Norleen.    Patient verbalized understanding.  No additional questions/concerns noted during the time of the call  Protocols used: Information Only Call - No Triage-A-AH

## 2023-11-26 ENCOUNTER — Telehealth: Payer: Self-pay

## 2023-11-26 ENCOUNTER — Inpatient Hospital Stay (HOSPITAL_BASED_OUTPATIENT_CLINIC_OR_DEPARTMENT_OTHER)
Admission: RE | Admit: 2023-11-26 | Discharge: 2023-11-26 | Disposition: A | Discharge status: Home / Self Care | Source: Ambulatory Visit | Attending: Internal Medicine | Admitting: Internal Medicine

## 2023-11-26 ENCOUNTER — Ambulatory Visit: Payer: Self-pay

## 2023-11-26 DIAGNOSIS — I2510 Atherosclerotic heart disease of native coronary artery without angina pectoris: Secondary | ICD-10-CM | POA: Diagnosis not present

## 2023-11-26 DIAGNOSIS — F17210 Nicotine dependence, cigarettes, uncomplicated: Secondary | ICD-10-CM | POA: Diagnosis not present

## 2023-11-26 DIAGNOSIS — R490 Dysphonia: Secondary | ICD-10-CM | POA: Diagnosis not present

## 2023-11-26 DIAGNOSIS — R918 Other nonspecific abnormal finding of lung field: Secondary | ICD-10-CM | POA: Diagnosis not present

## 2023-11-26 DIAGNOSIS — J9601 Acute respiratory failure with hypoxia: Secondary | ICD-10-CM | POA: Diagnosis not present

## 2023-11-26 DIAGNOSIS — I3139 Other pericardial effusion (noninflammatory): Secondary | ICD-10-CM | POA: Diagnosis not present

## 2023-11-26 DIAGNOSIS — I480 Paroxysmal atrial fibrillation: Secondary | ICD-10-CM | POA: Diagnosis not present

## 2023-11-26 DIAGNOSIS — C781 Secondary malignant neoplasm of mediastinum: Secondary | ICD-10-CM | POA: Diagnosis not present

## 2023-11-26 DIAGNOSIS — Z7189 Other specified counseling: Secondary | ICD-10-CM | POA: Diagnosis not present

## 2023-11-26 DIAGNOSIS — I3131 Malignant pericardial effusion in diseases classified elsewhere: Secondary | ICD-10-CM | POA: Diagnosis not present

## 2023-11-26 DIAGNOSIS — E871 Hypo-osmolality and hyponatremia: Secondary | ICD-10-CM | POA: Diagnosis not present

## 2023-11-26 DIAGNOSIS — R634 Abnormal weight loss: Secondary | ICD-10-CM | POA: Diagnosis not present

## 2023-11-26 DIAGNOSIS — R846 Abnormal cytological findings in specimens from respiratory organs and thorax: Secondary | ICD-10-CM | POA: Diagnosis not present

## 2023-11-26 DIAGNOSIS — J9811 Atelectasis: Secondary | ICD-10-CM | POA: Diagnosis not present

## 2023-11-26 DIAGNOSIS — N1832 Chronic kidney disease, stage 3b: Secondary | ICD-10-CM | POA: Diagnosis not present

## 2023-11-26 DIAGNOSIS — R7989 Other specified abnormal findings of blood chemistry: Secondary | ICD-10-CM | POA: Insufficient documentation

## 2023-11-26 DIAGNOSIS — J441 Chronic obstructive pulmonary disease with (acute) exacerbation: Secondary | ICD-10-CM | POA: Diagnosis not present

## 2023-11-26 DIAGNOSIS — N179 Acute kidney failure, unspecified: Secondary | ICD-10-CM | POA: Diagnosis not present

## 2023-11-26 DIAGNOSIS — J9859 Other diseases of mediastinum, not elsewhere classified: Secondary | ICD-10-CM | POA: Diagnosis not present

## 2023-11-26 DIAGNOSIS — R000 Tachycardia, unspecified: Secondary | ICD-10-CM | POA: Diagnosis not present

## 2023-11-26 DIAGNOSIS — R64 Cachexia: Secondary | ICD-10-CM | POA: Diagnosis not present

## 2023-11-26 DIAGNOSIS — D631 Anemia in chronic kidney disease: Secondary | ICD-10-CM | POA: Diagnosis not present

## 2023-11-26 DIAGNOSIS — C799 Secondary malignant neoplasm of unspecified site: Secondary | ICD-10-CM | POA: Diagnosis not present

## 2023-11-26 DIAGNOSIS — Z681 Body mass index (BMI) 19 or less, adult: Secondary | ICD-10-CM | POA: Diagnosis not present

## 2023-11-26 DIAGNOSIS — K7010 Alcoholic hepatitis without ascites: Secondary | ICD-10-CM | POA: Diagnosis not present

## 2023-11-26 DIAGNOSIS — E43 Unspecified severe protein-calorie malnutrition: Secondary | ICD-10-CM | POA: Diagnosis not present

## 2023-11-26 DIAGNOSIS — D630 Anemia in neoplastic disease: Secondary | ICD-10-CM | POA: Diagnosis not present

## 2023-11-26 DIAGNOSIS — J90 Pleural effusion, not elsewhere classified: Secondary | ICD-10-CM | POA: Diagnosis not present

## 2023-11-26 DIAGNOSIS — E785 Hyperlipidemia, unspecified: Secondary | ICD-10-CM | POA: Diagnosis not present

## 2023-11-26 DIAGNOSIS — J439 Emphysema, unspecified: Secondary | ICD-10-CM | POA: Diagnosis not present

## 2023-11-26 DIAGNOSIS — K859 Acute pancreatitis without necrosis or infection, unspecified: Secondary | ICD-10-CM | POA: Diagnosis not present

## 2023-11-26 DIAGNOSIS — Z853 Personal history of malignant neoplasm of breast: Secondary | ICD-10-CM | POA: Diagnosis not present

## 2023-11-26 DIAGNOSIS — I129 Hypertensive chronic kidney disease with stage 1 through stage 4 chronic kidney disease, or unspecified chronic kidney disease: Secondary | ICD-10-CM | POA: Diagnosis not present

## 2023-11-26 DIAGNOSIS — Z515 Encounter for palliative care: Secondary | ICD-10-CM | POA: Diagnosis not present

## 2023-11-26 DIAGNOSIS — J432 Centrilobular emphysema: Secondary | ICD-10-CM | POA: Diagnosis not present

## 2023-11-26 DIAGNOSIS — Z66 Do not resuscitate: Secondary | ICD-10-CM | POA: Diagnosis not present

## 2023-11-26 MED ORDER — IOHEXOL 300 MG/ML  SOLN
60.0000 mL | Freq: Once | INTRAMUSCULAR | Status: AC | PRN
Start: 2023-11-26 — End: 2023-11-26
  Administered 2023-11-26: 60 mL via INTRAVENOUS

## 2023-11-26 NOTE — Telephone Encounter (Signed)
  FYI Only or Action Required?: FYI only for provider.  Patient was last seen in primary care on 11/23/2023 by Tammy Wolfe ORN, MD. Called Nurse Triage reporting Shortness of Breath. Symptoms began today. Interventions attempted: OTC medications: MDI. Symptoms are: completely resolved.  Triage Disposition: See PCP Within 2 Weeks  Patient/caregiver understands and will follow disposition?: Yes  Copied from CRM [id number]. Topic: Clinical - Red Word Triage >> Nov 26, 2023  3:52 PM Tammy Wolfe wrote: Red Word that prompted transfer to Nurse Triage: Tammy Wolfe from [facility] called in to report concern about the patient stating that she expressed being winded just walking into the office and that she has shortness of breath when laying down. Patient is not on the line but nurse there believes we should call her and request she go to the ER based off her symptoms.  I did not speak to patient directly, but she only has one number listed to call which is [phone] Reason for Disposition  [1] MILD longstanding difficulty breathing AND [2]  SAME as normal  Answer Assessment - Initial Assessment Questions Patient states that she has been having some difficulty breathing for about the past month, but that Dr. Norleen is treating her with oral medications, inhalers, and tests to find out why.   She admits to being winded after she had walked in for her CT, but was able to use her inhaler, get home, and feels better now.   She denies any signs or symptoms of distress and requires no intervention  Protocols used: Breathing Difficulty-A-AH

## 2023-11-26 NOTE — Telephone Encounter (Signed)
 Copied from CRM [id number]. Topic: General - Other >> Nov 26, 2023  3:47 PM Berneda F wrote: Reason for CRM: Chiquita from [facility] states that this patient came in for her CT but that she was in really bad shape and should go to the ER. She states she wanted us  to know and that we need to call her with CT results, not upload them to MyChart. I am referring this over to NT to call her and evaluate her based on the symptoms.

## 2023-11-27 ENCOUNTER — Other Ambulatory Visit: Payer: Self-pay

## 2023-11-27 ENCOUNTER — Inpatient Hospital Stay (HOSPITAL_COMMUNITY)
Admission: EM | Admit: 2023-11-27 | Discharge: 2023-12-15 | DRG: 180 | Disposition: A | Discharge status: Hospice | Attending: Internal Medicine | Admitting: Internal Medicine

## 2023-11-27 ENCOUNTER — Ambulatory Visit: Payer: Self-pay | Admitting: Internal Medicine

## 2023-11-27 ENCOUNTER — Inpatient Hospital Stay (HOSPITAL_COMMUNITY)

## 2023-11-27 ENCOUNTER — Emergency Department (HOSPITAL_COMMUNITY)

## 2023-11-27 ENCOUNTER — Telehealth: Payer: Self-pay | Admitting: Internal Medicine

## 2023-11-27 ENCOUNTER — Encounter (HOSPITAL_COMMUNITY): Payer: Self-pay

## 2023-11-27 DIAGNOSIS — C781 Secondary malignant neoplasm of mediastinum: Principal | ICD-10-CM | POA: Diagnosis present

## 2023-11-27 DIAGNOSIS — K5900 Constipation, unspecified: Secondary | ICD-10-CM | POA: Diagnosis present

## 2023-11-27 DIAGNOSIS — D631 Anemia in chronic kidney disease: Secondary | ICD-10-CM | POA: Diagnosis not present

## 2023-11-27 DIAGNOSIS — Z860100 Personal history of colon polyps, unspecified: Secondary | ICD-10-CM

## 2023-11-27 DIAGNOSIS — F17210 Nicotine dependence, cigarettes, uncomplicated: Secondary | ICD-10-CM | POA: Diagnosis present

## 2023-11-27 DIAGNOSIS — J9601 Acute respiratory failure with hypoxia: Secondary | ICD-10-CM | POA: Diagnosis present

## 2023-11-27 DIAGNOSIS — I3139 Other pericardial effusion (noninflammatory): Secondary | ICD-10-CM | POA: Diagnosis present

## 2023-11-27 DIAGNOSIS — K7010 Alcoholic hepatitis without ascites: Secondary | ICD-10-CM | POA: Diagnosis present

## 2023-11-27 DIAGNOSIS — Z7951 Long term (current) use of inhaled steroids: Secondary | ICD-10-CM

## 2023-11-27 DIAGNOSIS — I129 Hypertensive chronic kidney disease with stage 1 through stage 4 chronic kidney disease, or unspecified chronic kidney disease: Secondary | ICD-10-CM | POA: Diagnosis present

## 2023-11-27 DIAGNOSIS — I2510 Atherosclerotic heart disease of native coronary artery without angina pectoris: Secondary | ICD-10-CM | POA: Diagnosis present

## 2023-11-27 DIAGNOSIS — J439 Emphysema, unspecified: Secondary | ICD-10-CM | POA: Diagnosis present

## 2023-11-27 DIAGNOSIS — Z66 Do not resuscitate: Secondary | ICD-10-CM | POA: Diagnosis present

## 2023-11-27 DIAGNOSIS — J9811 Atelectasis: Secondary | ICD-10-CM | POA: Diagnosis present

## 2023-11-27 DIAGNOSIS — C799 Secondary malignant neoplasm of unspecified site: Secondary | ICD-10-CM | POA: Diagnosis present

## 2023-11-27 DIAGNOSIS — Z681 Body mass index (BMI) 19 or less, adult: Secondary | ICD-10-CM

## 2023-11-27 DIAGNOSIS — J432 Centrilobular emphysema: Secondary | ICD-10-CM | POA: Diagnosis not present

## 2023-11-27 DIAGNOSIS — D75839 Thrombocytosis, unspecified: Secondary | ICD-10-CM | POA: Diagnosis present

## 2023-11-27 DIAGNOSIS — R634 Abnormal weight loss: Secondary | ICD-10-CM

## 2023-11-27 DIAGNOSIS — E785 Hyperlipidemia, unspecified: Secondary | ICD-10-CM | POA: Diagnosis present

## 2023-11-27 DIAGNOSIS — R490 Dysphonia: Secondary | ICD-10-CM | POA: Diagnosis present

## 2023-11-27 DIAGNOSIS — F419 Anxiety disorder, unspecified: Secondary | ICD-10-CM | POA: Diagnosis present

## 2023-11-27 DIAGNOSIS — R54 Age-related physical debility: Secondary | ICD-10-CM | POA: Diagnosis present

## 2023-11-27 DIAGNOSIS — J90 Pleural effusion, not elsewhere classified: Principal | ICD-10-CM | POA: Diagnosis present

## 2023-11-27 DIAGNOSIS — Z79899 Other long term (current) drug therapy: Secondary | ICD-10-CM

## 2023-11-27 DIAGNOSIS — R918 Other nonspecific abnormal finding of lung field: Secondary | ICD-10-CM | POA: Diagnosis not present

## 2023-11-27 DIAGNOSIS — J9859 Other diseases of mediastinum, not elsewhere classified: Secondary | ICD-10-CM

## 2023-11-27 DIAGNOSIS — K859 Acute pancreatitis without necrosis or infection, unspecified: Secondary | ICD-10-CM | POA: Diagnosis present

## 2023-11-27 DIAGNOSIS — E871 Hypo-osmolality and hyponatremia: Secondary | ICD-10-CM | POA: Diagnosis present

## 2023-11-27 DIAGNOSIS — Z79810 Long term (current) use of selective estrogen receptor modulators (SERMs): Secondary | ICD-10-CM

## 2023-11-27 DIAGNOSIS — Z881 Allergy status to other antibiotic agents status: Secondary | ICD-10-CM

## 2023-11-27 DIAGNOSIS — R000 Tachycardia, unspecified: Secondary | ICD-10-CM | POA: Diagnosis not present

## 2023-11-27 DIAGNOSIS — N179 Acute kidney failure, unspecified: Secondary | ICD-10-CM | POA: Diagnosis present

## 2023-11-27 DIAGNOSIS — D630 Anemia in neoplastic disease: Secondary | ICD-10-CM | POA: Diagnosis present

## 2023-11-27 DIAGNOSIS — E43 Unspecified severe protein-calorie malnutrition: Secondary | ICD-10-CM | POA: Diagnosis present

## 2023-11-27 DIAGNOSIS — I480 Paroxysmal atrial fibrillation: Secondary | ICD-10-CM | POA: Diagnosis present

## 2023-11-27 DIAGNOSIS — N1832 Chronic kidney disease, stage 3b: Secondary | ICD-10-CM | POA: Diagnosis present

## 2023-11-27 DIAGNOSIS — M810 Age-related osteoporosis without current pathological fracture: Secondary | ICD-10-CM | POA: Diagnosis present

## 2023-11-27 DIAGNOSIS — K3189 Other diseases of stomach and duodenum: Secondary | ICD-10-CM | POA: Diagnosis present

## 2023-11-27 DIAGNOSIS — L8990 Pressure ulcer of unspecified site, unspecified stage: Secondary | ICD-10-CM | POA: Insufficient documentation

## 2023-11-27 DIAGNOSIS — Z853 Personal history of malignant neoplasm of breast: Secondary | ICD-10-CM | POA: Diagnosis not present

## 2023-11-27 DIAGNOSIS — R64 Cachexia: Secondary | ICD-10-CM | POA: Diagnosis present

## 2023-11-27 DIAGNOSIS — Z83438 Family history of other disorder of lipoprotein metabolism and other lipidemia: Secondary | ICD-10-CM

## 2023-11-27 DIAGNOSIS — Z604 Social exclusion and rejection: Secondary | ICD-10-CM | POA: Diagnosis present

## 2023-11-27 DIAGNOSIS — Z9886 Personal history of breast implant removal: Secondary | ICD-10-CM

## 2023-11-27 DIAGNOSIS — Z515 Encounter for palliative care: Secondary | ICD-10-CM

## 2023-11-27 DIAGNOSIS — J441 Chronic obstructive pulmonary disease with (acute) exacerbation: Secondary | ICD-10-CM | POA: Diagnosis present

## 2023-11-27 DIAGNOSIS — M954 Acquired deformity of chest and rib: Secondary | ICD-10-CM | POA: Diagnosis present

## 2023-11-27 DIAGNOSIS — Z888 Allergy status to other drugs, medicaments and biological substances status: Secondary | ICD-10-CM

## 2023-11-27 DIAGNOSIS — I3131 Malignant pericardial effusion in diseases classified elsewhere: Secondary | ICD-10-CM | POA: Diagnosis present

## 2023-11-27 DIAGNOSIS — Z7189 Other specified counseling: Secondary | ICD-10-CM | POA: Diagnosis not present

## 2023-11-27 DIAGNOSIS — Z9013 Acquired absence of bilateral breasts and nipples: Secondary | ICD-10-CM

## 2023-11-27 LAB — BASIC METABOLIC PANEL WITH GFR
Anion gap: 9 (ref 5–15)
BUN: 78 mg/dL — ABNORMAL HIGH (ref 8–23)
CO2: 22 mmol/L (ref 22–32)
Calcium: 9.9 mg/dL (ref 8.9–10.3)
Chloride: 102 mmol/L (ref 98–111)
Creatinine, Ser: 1.51 mg/dL — ABNORMAL HIGH (ref 0.44–1.00)
GFR, Estimated: 34 mL/min — ABNORMAL LOW (ref >60)
Glucose, Bld: 119 mg/dL — ABNORMAL HIGH (ref 70–99)
Potassium: 5.4 mmol/L — ABNORMAL HIGH (ref 3.5–5.1)
Sodium: 133 mmol/L — ABNORMAL LOW (ref 135–145)

## 2023-11-27 LAB — CBC WITH DIFFERENTIAL/PLATELET
Abs Immature Granulocytes: 0.13 K/uL — ABNORMAL HIGH (ref 0.00–0.07)
Basophils Absolute: 0 K/uL (ref 0.0–0.1)
Basophils Relative: 0 %
Eosinophils Absolute: 0 K/uL (ref 0.0–0.5)
Eosinophils Relative: 0 %
HCT: 36.8 % (ref 36.0–46.0)
Hemoglobin: 11.5 g/dL — ABNORMAL LOW (ref 12.0–15.0)
Immature Granulocytes: 1 %
Lymphocytes Relative: 3 %
Lymphs Abs: 0.4 K/uL — ABNORMAL LOW (ref 0.7–4.0)
MCH: 26.5 pg (ref 26.0–34.0)
MCHC: 31.3 g/dL (ref 30.0–36.0)
MCV: 84.8 fL (ref 80.0–100.0)
Monocytes Absolute: 0.9 K/uL (ref 0.1–1.0)
Monocytes Relative: 6 %
Neutro Abs: 14.3 K/uL — ABNORMAL HIGH (ref 1.7–7.7)
Neutrophils Relative %: 90 %
Platelets: 544 K/uL — ABNORMAL HIGH (ref 150–400)
RBC: 4.34 MIL/uL (ref 3.87–5.11)
RDW: 16.1 % — ABNORMAL HIGH (ref 11.5–15.5)
WBC: 15.8 K/uL — ABNORMAL HIGH (ref 4.0–10.5)
nRBC: 0 % (ref 0.0–0.2)

## 2023-11-27 LAB — ECHOCARDIOGRAM COMPLETE
Est EF: >75
Height: 59 in
S' Lateral: 2 cm
Weight: 1280 [oz_av]

## 2023-11-27 LAB — TROPONIN I (HIGH SENSITIVITY)
Troponin I (High Sensitivity): 11 ng/L (ref <18)
Troponin I (High Sensitivity): 13 ng/L (ref <18)

## 2023-11-27 LAB — PROCALCITONIN: Procalcitonin: 0.14 ng/mL

## 2023-11-27 LAB — SEDIMENTATION RATE: Sed Rate: 23 mm/h — ABNORMAL HIGH (ref 0–22)

## 2023-11-27 LAB — BRAIN NATRIURETIC PEPTIDE: B Natriuretic Peptide: 60.1 pg/mL (ref 0.0–100.0)

## 2023-11-27 LAB — C-REACTIVE PROTEIN: CRP: <0.5 mg/dL (ref <1.0)

## 2023-11-27 MED ORDER — REVEFENACIN 175 MCG/3ML IN SOLN
175.0000 ug | Freq: Every day | RESPIRATORY_TRACT | Status: DC
  Administered 2023-11-27 – 2023-12-13 (×17): 175 ug via RESPIRATORY_TRACT
  Filled 2023-11-27 (×18): qty 3

## 2023-11-27 MED ORDER — BUDESONIDE 0.25 MG/2ML IN SUSP
0.2500 mg | Freq: Two times a day (BID) | RESPIRATORY_TRACT | Status: DC
  Administered 2023-11-27 – 2023-12-03 (×12): 0.25 mg via RESPIRATORY_TRACT
  Filled 2023-11-27 (×12): qty 2

## 2023-11-27 MED ORDER — ARFORMOTEROL TARTRATE 15 MCG/2ML IN NEBU
15.0000 ug | INHALATION_SOLUTION | Freq: Two times a day (BID) | RESPIRATORY_TRACT | Status: DC
  Administered 2023-11-27 – 2023-12-13 (×32): 15 ug via RESPIRATORY_TRACT
  Filled 2023-11-27 (×35): qty 2

## 2023-11-27 MED ORDER — HYDRALAZINE HCL 20 MG/ML IJ SOLN
10.0000 mg | Freq: Three times a day (TID) | INTRAMUSCULAR | Status: DC | PRN
Start: 2023-11-27 — End: 2023-12-08

## 2023-11-27 MED ORDER — ACETAMINOPHEN 325 MG PO TABS
650.0000 mg | ORAL_TABLET | Freq: Four times a day (QID) | ORAL | Status: DC | PRN
  Administered 2023-11-28: 650 mg via ORAL
  Filled 2023-11-27: qty 2

## 2023-11-27 MED ORDER — SODIUM CHLORIDE 0.9% FLUSH
3.0000 mL | Freq: Two times a day (BID) | INTRAVENOUS | Status: DC
  Administered 2023-11-28 – 2023-12-15 (×33): 3 mL via INTRAVENOUS

## 2023-11-27 MED ORDER — ONDANSETRON HCL 4 MG/2ML IJ SOLN
4.0000 mg | Freq: Four times a day (QID) | INTRAMUSCULAR | Status: DC | PRN
  Administered 2023-12-03: 4 mg via INTRAVENOUS
  Filled 2023-11-27: qty 2

## 2023-11-27 MED ORDER — SODIUM CHLORIDE 0.9 % IV SOLN
1.0000 g | INTRAVENOUS | Status: DC
  Administered 2023-11-27 – 2023-12-01 (×5): 1 g via INTRAVENOUS
  Filled 2023-11-27 (×5): qty 10

## 2023-11-27 MED ORDER — ACETAMINOPHEN 650 MG RE SUPP: 650.0000 mg | Freq: Four times a day (QID) | RECTAL | Status: DC | PRN

## 2023-11-27 MED ORDER — ALBUTEROL SULFATE (2.5 MG/3ML) 0.083% IN NEBU: 2.5000 mg | INHALATION_SOLUTION | RESPIRATORY_TRACT | Status: DC | PRN

## 2023-11-27 MED ORDER — ENOXAPARIN SODIUM 40 MG/0.4ML IJ SOSY: 40.0000 mg | PREFILLED_SYRINGE | INTRAMUSCULAR | Status: DC

## 2023-11-27 MED ORDER — POLYETHYLENE GLYCOL 3350 17 G PO PACK: 17.0000 g | PACK | Freq: Every day | ORAL | Status: DC | PRN

## 2023-11-27 MED ORDER — ENOXAPARIN SODIUM 30 MG/0.3ML IJ SOSY: 30.0000 mg | PREFILLED_SYRINGE | INTRAMUSCULAR | Status: DC

## 2023-11-27 MED ORDER — OXYCODONE HCL 5 MG PO TABS
5.0000 mg | ORAL_TABLET | ORAL | Status: DC | PRN
  Administered 2023-11-28 – 2023-12-08 (×27): 5 mg via ORAL
  Filled 2023-11-27 (×26): qty 1

## 2023-11-27 MED ORDER — IPRATROPIUM-ALBUTEROL 0.5-2.5 (3) MG/3ML IN SOLN: 3.0000 mL | Freq: Three times a day (TID) | RESPIRATORY_TRACT | Status: DC

## 2023-11-27 MED ORDER — GUAIFENESIN ER 600 MG PO TB12
600.0000 mg | ORAL_TABLET | Freq: Two times a day (BID) | ORAL | Status: DC
  Administered 2023-11-27 – 2023-12-13 (×31): 600 mg via ORAL
  Filled 2023-11-27 (×32): qty 1

## 2023-11-27 MED ORDER — METHOCARBAMOL 1000 MG/10ML IJ SOLN: 500.0000 mg | Freq: Four times a day (QID) | INTRAMUSCULAR | Status: DC | PRN

## 2023-11-27 MED ORDER — ONDANSETRON HCL 4 MG PO TABS
4.0000 mg | ORAL_TABLET | Freq: Four times a day (QID) | ORAL | Status: DC | PRN
Start: 2023-11-27 — End: 2023-12-08

## 2023-11-27 MED ORDER — AZITHROMYCIN 500 MG PO TABS
500.0000 mg | ORAL_TABLET | Freq: Every day | ORAL | Status: DC
  Administered 2023-11-27 – 2023-12-01 (×4): 500 mg via ORAL
  Filled 2023-11-27 (×3): qty 2
  Filled 2023-11-27: qty 1

## 2023-11-27 MED ORDER — FUROSEMIDE 10 MG/ML IJ SOLN
40.0000 mg | Freq: Every day | INTRAMUSCULAR | Status: DC
  Administered 2023-11-27 – 2023-12-02 (×5): 40 mg via INTRAVENOUS
  Filled 2023-11-27 (×5): qty 4

## 2023-11-27 MED ORDER — IPRATROPIUM-ALBUTEROL 0.5-2.5 (3) MG/3ML IN SOLN
3.0000 mL | Freq: Four times a day (QID) | RESPIRATORY_TRACT | Status: DC | PRN
  Administered 2023-11-29: 3 mL via RESPIRATORY_TRACT
  Filled 2023-11-27: qty 3

## 2023-11-27 MED ORDER — HEPARIN SODIUM (PORCINE) 5000 UNIT/ML IJ SOLN
5000.0000 [IU] | Freq: Three times a day (TID) | INTRAMUSCULAR | Status: DC
  Administered 2023-11-27 – 2023-12-08 (×33): 5000 [IU] via SUBCUTANEOUS
  Filled 2023-11-27 (×31): qty 1

## 2023-11-27 NOTE — H&P (Signed)
 History and Physical    Patient: Tammy Wolfe FMW:[id number] DOB: 1940/05/29 DOA: 11/27/2023 DOS: the patient was seen and examined on 11/27/2023 PCP: [name] ORN, MD  Patient coming from: Home  Chief Complaint:  Chief Complaint  Patient presents with   Shortness of Breath   HPI: Tammy Wolfe is a 83 y.o. female with medical history significant of breast cancer status post double mastectomy, essential hypertension, COPD, tobacco abuse, who was brought to the emergency room with chief complaint of shortness of breath.  As per patient, shortness of breath has been going on for 3-4 weeks now but has gotten worse over the past few days.   She was seen initially by her PCP on June 13 and was prescribed an inhaler and an antibiotic for possibility of upper respiratory tract infection.  Patient also has been complaining of hoarseness of voice for the past 3 weeks.  She states that her symptoms did not improve so she went to see her PCP again on June 30.  Her PCP was concerned about the Pulmonary embolism so a CT angio of the chest was ordered which was done yesterday.   Today, patient's PCP called her that she needs to go to the hospital because she has fluid around her heart as well as around both her lungs.  Patient was accompanied by her daughter at the bedside who is her healthcare power of attorney.  She said that she did not have fever at home, nausea, vomiting or diarrhea.  Patient has a long history of tobacco abuse and quit 2 months back.  She does not follow-up with a pulmonologist.  She was recently prescribed Trelegy Ellipta  inhaler as well as albuterol  rescue inhaler. She said that she has lost 10 pounds over the past month.  On an average, she weighs around 90 pounds.  She also has some loss of appetite.  She lives at home by herself and is independent in activities of daily life.  She does not use oxygen at baseline.  ED course: Because of the presence of the pericardial effusion, ED  physician spoke to cardiothoracic surgeon who recommended to talk with the cardiologist.  ED physician spoke to on-call cardiologist, Dr.Mallipeddi will see the patient as a consultation.  Stat echocardiogram has been ordered.  Bedside echocardiogram done in the emergency room was concerning for pericardial tamponade.  Patient is not hypotensive but is tachycardic.  She does have muffled heart sounds.  She was placed on 3 L nasal cannula with improvement in shortness of breath symptoms.  Review of Systems: As mentioned in the history of present illness. All other systems reviewed and are negative. Past Medical History:  Diagnosis Date   ABNORMAL THYROID  FUNCTION TESTS 12/10/2009   ALCOHOLIC HEPATITIS, HX OF 02/14/2007   ALLERGIC RHINITIS 02/14/2007   Anemia, unspecified 04/26/2012   ANXIETY 03/19/2007   Breast cancer (HCC)    Breast implant removal status 12/23/2008   COLONIC POLYPS, HX OF 02/14/2007   COPD (chronic obstructive pulmonary disease) (HCC)    Coronary artery calcification seen on CT scan 10/31/2013   GOITER, MULTINODULAR 02/14/2007   HX, PERSONAL, ALCOHOLISM 02/14/2007   HYPERLIPIDEMIA 02/14/2007   HYPERTENSION 02/14/2007   Insomnia    OSTEOARTHRITIS 02/14/2007   OSTEOPOROSIS 02/14/2007   PANCREATITIS, HX OF 02/14/2007   Smoker 09/06/2013   Past Surgical History:  Procedure Laterality Date   BREAST SURGERY  2010   silicone prepectoral implants explanted by Dr. Marcus after rupture   BREAST SURGERY  left simple mastectomy and left axillary sentinel node biopsy, right prophylactic mastectomy   CATARACT EXTRACTION     NASAL SINUS SURGERY Left 06/14/2021   Procedure: ENDOSCOPIC MAXILLARY ANTROSTOMY WITH REMOVAL OF TISSUE;  Surgeon: Jesus Oliphant, MD;  Location: [facility] OR;  Service: ENT;  Laterality: Left;   peridontal  2010   s/p jaw and periodontal surgury  2009   Social History:  reports that she quit smoking about 2 years ago. Her smoking use included cigarettes. She  has never used smokeless tobacco. She reports that she does not currently use alcohol. She reports that she does not use drugs.  Allergies  Allergen Reactions   Tetracycline Anaphylaxis   Lovastatin  Other (See Comments)    Muscle cramps   Pravastatin  Other (See Comments)    Leg cramps    Family History  Adopted: Yes  Problem Relation Age of Onset   Hyperlipidemia Brother     Prior to Admission medications   Medication Sig Start Date End Date Taking? Authorizing Provider  acetaminophen  (TYLENOL ) 325 MG tablet Take 650 mg by mouth every 6 (six) hours as needed.    [provider]  albuterol  (VENTOLIN  HFA) 108 (90 Base) MCG/ACT inhaler Inhale 2 puffs into the lungs every 6 (six) hours as needed for wheezing or shortness of breath. 11/23/23   [name] ORN, MD  ALPRAZolam  (XANAX ) 0.25 MG tablet TAKE ONE TABLET THREE TIMES DAILY AS NEEDED 11/10/23   [name] ORN, MD  azithromycin  (ZITHROMAX ) 250 MG tablet Take 2 tablets on day 1, then 1 tablet daily on days 2 through 5 11/23/23 11/28/23  [name] ORN, MD  Cholecalciferol (VITAMIN D ) 1000 UNITS capsule Take 1,000 Units by mouth daily.      [provider]  cyanocobalamin  1000 MCG tablet Take 1,000 mcg by mouth daily.    [provider]  cyclobenzaprine  (FLEXERIL ) 5 MG tablet 1 tab by mouth at bedtime as needed for cramps and back pain 11/10/23   [name] ORN, MD  Fluticasone-Umeclidin-Vilant (TRELEGY ELLIPTA ) 100-62.5-25 MCG/ACT AEPB Inhale 1 puff into the lungs daily. 11/10/23   [name] ORN, MD  HYDROcodone  bit-homatropine [facility]) 5-1.5 MG/5ML syrup Take 5 mLs by mouth every 6 (six) hours as needed for up to 10 days. 11/23/23 12/03/23  [name] ORN, MD  HYDROcodone -acetaminophen  (NORCO/VICODIN) 5-325 MG tablet Take 1 tablet by mouth 2 (two) times daily as needed. 11/10/23   [name] ORN, MD  losartan  (COZAAR ) 100 MG tablet Take 1 tablet (100 mg total) by mouth daily. 11/10/23   [name] ORN, MD  oxymetazoline   (AFRIN) 0.05 % nasal spray Place 1 spray into both nostrils daily as needed for congestion.    [provider]  predniSONE  (DELTASONE ) 10 MG tablet 3 tabs by mouth per day for 3 days,2tabs per day for 3 days,1tab per day for 3 days 11/23/23   [name] ORN, MD  trimethoprim  (TRIMPEX ) 100 MG tablet Take 1 tablet (100 mg total) by mouth daily. 11/10/23   [name] ORN, MD    Physical Exam: Vitals:   11/27/23 1515 11/27/23 1530 11/27/23 1545 11/27/23 1600  BP: 132/89 138/89 (!) 148/93 (!) 142/91  Pulse: (!) 119 (!) 118 (!) 120 (!) 118  Resp: (!) 27 (!) 29 (!) 33 (!) 28  Temp:      SpO2: 100% 98% 98% 99%  Weight:      Height:       Constitutional: Mild distress, nasal oxygen cannula in place, hoarse  voice Eyes: PERRL, lids and conjunctivae normal ENMT: Mucous membranes are moist. Posterior pharynx clear of any exudate or lesions.Normal dentition.  Neck: normal, supple, no masses, no thyromegaly Respiratory: Diminished breath sounds bilaterally, barrel chest Cardiovascular: Tachycardic, muffled heart sounds, No extremity edema. 2+ pedal pulses. No carotid bruits.  Abdomen: no tenderness, no masses palpated. No hepatosplenomegaly. Bowel sounds positive.  Musculoskeletal: no clubbing / cyanosis. No joint deformity upper and lower extremities. Good ROM, no contractures. Normal muscle tone.  Skin: no rashes, lesions, ulcers. No induration Neurologic: CN 2-12 grossly intact. Sensation intact, DTR normal. Strength 5/5 x all 4 extremities.  Psychiatric: Normal judgment and insight. Alert and oriented x 3. Normal mood.   Data Reviewed:  There are no new results to review at this time.  Assessment and Plan:  83 years old female with past medical history of breast cancer status post double mastectomy, COPD, tobacco abuse and essential hypertension, brought to the ED with shortness of breath.   Acute respiratory failure with hypoxia, POA: Requiring 3 L of oxygen.  This in the setting of  bilateral effusion, pericardial effusion as well as acute exacerbation of COPD and possible bacterial pneumonia.  Bilateral pleural effusions, POA: Unclear etiology at this time.  This may be infectious versus malignant.  I will start the patient on intravenous ceftriaxone  and azithromycin  for possibility of bacterial pneumonia leading to pleural effusions but since this is bilateral pleural effusions, malignancy has to be ruled out.  Case has been discussed with pulmonology who will see the patient as a consultation.  Continue with the nasal oxygen cannula.  Pericardial effusion with concern for pericardial tamponade, POA: Cardiology has been consulted.  Follow-up echocardiogram.  Continue cardiac monitoring closely.  Patient is tachycardic but not hypotensive.  She does have muffled heart sounds on cardiac auscultation.  Etiology of the pericardial effusion is unclear at this time.  This may be malignant pericardial effusion.  Rest of the management as per cardiology. TSH on 6/17 is normal.  Acute exacerbation of COPD, POA: Patient did not follow-up with any pulmonologist.  She was recently prescribed Trelegy Ellipta  inhaler as well as albuterol  rescue inhaler.  Pulmonology consulted.  Continue with initial bronchodilator therapy as well as empiric antibiotics for possibility of superimposed infection.  CKD stage IIIA: f/u BMP closely. F/u Vitamin D  25-OH and PTH levels.  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  Pulmonology consulted.,  Patient does have history of breast cancer in the remote past.  Breast cancer status post double mastectomy, PA: Patient was on tamoxifen  after double mastectomy, no acute issues.  DVT prophylaxis: Heparin  5000 subcutaneous every 8 hours   Advance Care Planning:   Code Status: Full Code Discussed in length with the patient and daughter (HPOA) at the bedside.  Consults: Cardiology, Pulmonology  Family  Communication: Daughter at bedside  Severity of Illness: The appropriate patient status for this patient is INPATIENT. Inpatient status is judged to be reasonable and necessary in order to provide the required intensity of service to ensure the patient's safety. The patient's presenting symptoms, physical exam findings, and initial radiographic and laboratory data in the context of their chronic comorbidities is felt to place them at high risk for further clinical deterioration. Furthermore, it is not anticipated that the patient will be medically stable for discharge from the hospital within 2 midnights of admission.   * I certify that at the point of admission it is my clinical judgment that  the patient will require inpatient hospital care spanning beyond 2 midnights from the point of admission due to high intensity of service, high risk for further deterioration and high frequency of surveillance required.*  Author: Deliliah Room, MD 11/27/2023 4:41 PM  For on call review [email or web address].

## 2023-11-27 NOTE — Progress Notes (Signed)
  Echocardiogram 2D Echocardiogram has been performed.  Tammy Wolfe 11/27/2023, 5:30 PM

## 2023-11-27 NOTE — ED Triage Notes (Signed)
 Pt has been SHOB x 3 weeks. Had CT scan yesterday and was told she has fluid around heart and possible mass in upper left lung. Pt unable to speak full sentences, labored breathing, tachypnic in triage.

## 2023-11-27 NOTE — ED Provider Notes (Signed)
  Physical Exam  BP (!) 142/91   Pulse (!) 118   Temp 98 F (36.7 C)   Resp (!) 28   Ht 4' 11 (1.499 m)   Wt 36.3 kg   SpO2 99%   BMI 16.16 kg/m    Procedures  Ultrasound ED Echo  Date/Time: 11/27/2023 4:25 PM  Performed by: Raoul Rake, MD Authorized by: Dino Antu, MD   Procedure details:    Indications: dyspnea     Views: subxiphoid, parasternal long axis view, parasternal short axis view and apical 4 chamber view     Images: archived     Limitations:  Body habitus and increased thoracic air Findings:    Pericardium: moderate pericardial effusion and RV collapse/tamponade     Cardiac Activity: hyperdynamic     LV Function: normal (>50% EF)     RV Diameter comment:  Decreased Impression:    Impression: pericardial effusion present     Impression comment:  Concern for tamponade Ultrasound ED Thoracic  Date/Time: 11/27/2023 4:27 PM  Performed by: Raoul Rake, MD Authorized by: Dino Antu, MD   Procedure details:    Indications: dyspnea     Assessment for:  Pleural effusion and interstitial syndrome   Left lung pleural:  Visualized   Right lung pleural:  Visualized   Images: archived     Limitations:  Body habitus Findings:    A-lines noted throughout: identified     B-lines noted throughout: identified   Right Lung Findings:     right lung sliding    right lung pleural effusion      Left Lung Findings:     left lung sliding    left lung pleural effusion Impression:    Impression: fluid in thorax and pulmonary edema        Raoul Rake, MD 11/27/23 HANNA    Dean Clarity, MD 11/28/23 [phone]

## 2023-11-27 NOTE — Telephone Encounter (Signed)
 Just received results of outpatient CTA chest done for pos D Dimer 6/30  NO PE seen, but unfortunately with right upper lung mass, mod bilateral pleural effusions and pericardial effusion - can't r/o tamponade  I informed pt by phone, pt should go to [facility] ED now, with evaluation to hopefully include Echocardiogram and possible cardiology ./ oncology consultations.

## 2023-11-27 NOTE — Consult Note (Addendum)
 Cardiology Consultation   Patient ID: Tammy Wolfe MRN: [id number]; DOB: 11-13-1940  Admit date: 11/27/2023 Date of Consult: 11/27/2023  PCP:  [name] ORN, MD   [facility] Providers Cardiologist:  None       CC: Shortness of breath Reason of Consult: Pericardial effusion  Patient Profile: Tammy Wolfe is a 83 y.o. 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 Dino.  History of Present Illness: Ms. Salak was recently seen by primary care for worsening fever, cough and shortness of breath. She was tx for AECOPD. DDimer was elevated and CXR showed bibasilar and R apical pulmonary opacities, possible R perihilar nodule. 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 She was referred to the ED.   EKG: Sinus tachy, HR 129, normal axis, no STTW changes  ED Labs: Na 133, K 5.4, SCr 1.51, WBC 15.8, Hgb 11.5, Plt 544K, BNP 60.1, hsT 11 ED CXR: mod R pl effusion, mild L pl effusion  Labs 11/23/23: AST 81, ALT 231, ALP 692, BNP 45  She notes that her breathing has gotten slightly better with taking antibiotics. However, she remains significantly short of breath. She notes PND, has 2 pillow orthopnea at baseline, no lower extremity swelling. She has not had chest pain, syncope.  Past Medical History:  Diagnosis Date   ABNORMAL THYROID  FUNCTION TESTS 12/10/2009   ALCOHOLIC HEPATITIS, HX OF 02/14/2007   ALLERGIC RHINITIS 02/14/2007   Anemia, unspecified 04/26/2012   ANXIETY 03/19/2007   Breast cancer (HCC)    Breast implant removal status 12/23/2008   COLONIC  POLYPS, HX OF 02/14/2007   COPD (chronic obstructive pulmonary disease) (HCC)    Coronary artery calcification seen on CT scan 10/31/2013   GOITER, MULTINODULAR 02/14/2007   HX, PERSONAL, ALCOHOLISM 02/14/2007   HYPERLIPIDEMIA 02/14/2007   HYPERTENSION 02/14/2007   Insomnia    OSTEOARTHRITIS 02/14/2007   OSTEOPOROSIS 02/14/2007   PANCREATITIS, HX OF 02/14/2007   Smoker 09/06/2013    Past Surgical History:  Procedure Laterality Date   BREAST SURGERY  2010   silicone prepectoral implants explanted by Dr. Marcus after rupture   BREAST SURGERY     left simple mastectomy and left axillary sentinel node biopsy, right prophylactic mastectomy   CATARACT EXTRACTION     NASAL SINUS SURGERY Left 06/14/2021   Procedure: ENDOSCOPIC MAXILLARY ANTROSTOMY WITH REMOVAL OF TISSUE;  Surgeon: Jesus Oliphant, MD;  Location: [facility] OR;  Service: ENT;  Laterality: Left;   peridontal  2010   s/p jaw and periodontal surgury  2009     Home Medications:  Prior to Admission medications   Medication Sig Start Date End Date Taking? Authorizing Provider  acetaminophen  (TYLENOL ) 325 MG tablet Take 650 mg by mouth every 6 (six) hours as needed.    [provider]  albuterol  (VENTOLIN  HFA) 108 (90 Base) MCG/ACT inhaler Inhale 2 puffs into the lungs every 6 (six) hours as needed for wheezing or shortness of breath. 11/23/23   [name] ORN, MD  ALPRAZolam  (XANAX ) 0.25 MG tablet TAKE ONE TABLET THREE TIMES DAILY AS NEEDED 11/10/23   [name] ORN, MD  azithromycin  (ZITHROMAX ) 250 MG tablet Take 2 tablets on day 1, then 1 tablet daily on days 2 through 5 11/23/23 11/28/23  [name] ORN, MD  Cholecalciferol (VITAMIN D ) 1000 UNITS capsule Take 1,000 Units by mouth daily.      [provider]  cyanocobalamin  1000 MCG tablet Take 1,000 mcg by mouth daily.    [provider]  cyclobenzaprine  (FLEXERIL ) 5 MG tablet 1 tab by mouth at bedtime as needed for cramps and back pain 11/10/23   [name] ORN, MD   Fluticasone-Umeclidin-Vilant (TRELEGY ELLIPTA ) 100-62.5-25 MCG/ACT AEPB Inhale 1 puff into the lungs daily. 11/10/23   [name] ORN, MD  HYDROcodone  bit-homatropine (HYCODAN) 5-1.5 MG/5ML syrup Take 5 mLs by mouth every 6 (six) hours as needed for up to 10 days. 11/23/23 12/03/23  [name] ORN, MD  HYDROcodone -acetaminophen  (NORCO/VICODIN) 5-325 MG tablet Take 1 tablet by mouth 2 (two) times daily as needed. 11/10/23   [name] ORN, MD  losartan  (COZAAR ) 100 MG tablet Take 1 tablet (100 mg total) by mouth daily. 11/10/23   [name] ORN, MD  oxymetazoline  (AFRIN) 0.05 % nasal spray Place 1 spray into both nostrils daily as needed for congestion.    [provider]  predniSONE  (DELTASONE ) 10 MG tablet 3 tabs by mouth per day for 3 days,2tabs per day for 3 days,1tab per day for 3 days 11/23/23   [name] ORN, MD  trimethoprim  (TRIMPEX ) 100 MG tablet Take 1 tablet (100 mg total) by mouth daily. 11/10/23   [name] ORN, MD    Scheduled Meds:  arformoterol   15 mcg Nebulization BID   azithromycin   500 mg Oral Daily   budesonide  (PULMICORT ) nebulizer solution  0.25 mg Nebulization BID   guaiFENesin   600 mg Oral BID   heparin  injection (subcutaneous)  5,000 Units Subcutaneous Q8H   revefenacin   175 mcg Nebulization Daily   sodium chloride  flush  3 mL Intravenous Q12H   Continuous Infusions:  cefTRIAXone  (ROCEPHIN )  IV 1 g (11/27/23 1747)   PRN Meds: acetaminophen  **OR** acetaminophen , hydrALAZINE , ipratropium-albuterol , methocarbamol  (ROBAXIN ) injection, ondansetron  **OR** ondansetron  (ZOFRAN ) IV, oxyCODONE , polyethylene glycol  Allergies:    Allergies  Allergen Reactions   Tetracycline Anaphylaxis   Lovastatin  Other (See Comments)    Muscle cramps   Pravastatin  Other (See Comments)    Leg cramps    Social History:   Social History   Socioeconomic History   Marital status: Divorced    Spouse name: Not on file   Number of children: 3   Years of education: Not on file    Highest education level: Not on file  Occupational History   Occupation: retired Visual merchandiser  Tobacco Use   Smoking status: Former    Current packs/day: 0.00    Types: Cigarettes    Quit date: 01/24/2021    Years since quitting: 2.8   Smokeless tobacco: Never   Tobacco comments:    smoking about 4 cigs/day (as of 04/10/16)-gwd  Vaping Use   Vaping status: Never Used  Substance and Sexual Activity   Alcohol use: Not Currently    Alcohol/week: 0.0 standard drinks of alcohol    Comment: one glass/wine occasional/ no drinking now   Drug use: No   Sexual activity: Not Currently  Other Topics Concern   Not on file  Social History Narrative   Not on file   Social Drivers of Health   Financial Resource Strain: Low Risk  (04/21/2023)   Overall Financial Resource Strain (CARDIA)  Difficulty of Paying Living Expenses: Not hard at all  Food Insecurity: No Food Insecurity (11/27/2023)   Hunger Vital Sign    Worried About Running Out of Food in the Last Year: Never true    Ran Out of Food in the Last Year: Never true  Transportation Needs: No Transportation Needs (11/27/2023)   PRAPARE - Administrator, Civil Service (Medical): No    Lack of Transportation (Non-Medical): No  Physical Activity: Inactive (04/21/2023)   Exercise Vital Sign    Days of Exercise per Week: 0 days    Minutes of Exercise per Session: 0 min  Stress: No Stress Concern Present (04/21/2023)   [employer] of Occupational Health - Occupational Stress Questionnaire    Feeling of Stress : Not at all  Social Connections: Moderately Isolated (11/27/2023)   Social Connection and Isolation Panel    Frequency of Communication with Friends and Family: More than three times a week    Frequency of Social Gatherings with Friends and Family: More than three times a week    Attends Religious Services: Never    Database administrator or Organizations: Not on file    Attends Banker Meetings: More than 4  times per year    Marital Status: Divorced  Intimate Partner Violence: Not At Risk (11/27/2023)   Humiliation, Afraid, Rape, and Kick questionnaire    Fear of Current or Ex-Partner: No    Emotionally Abused: No    Physically Abused: No    Sexually Abused: No    Family History:   Family History  Adopted: Yes  Problem Relation Age of Onset   Hyperlipidemia Brother      ROS:  Review of Systems  Constitutional: Positive for weight loss.  Cardiovascular:  Positive for dyspnea on exertion and paroxysmal nocturnal dyspnea. Negative for chest pain, claudication, irregular heartbeat, leg swelling, near-syncope, orthopnea, palpitations and syncope.  Respiratory:  Positive for cough and shortness of breath.   Hematologic/Lymphatic: Negative for bleeding problem.     Physical Exam/Data: Vitals:   11/27/23 1545 11/27/23 1600 11/27/23 1615 11/27/23 1735  BP: (!) 148/93 (!) 142/91 (!) 146/90 (!) 122/95  Pulse: (!) 120 (!) 118 (!) 119   Resp: (!) 33 (!) 28 (!) 37 20  Temp:    98.3 F (36.8 C)  TempSrc:    Oral  SpO2: 98% 99% 99%   Weight:      Height:       No intake or output data in the 24 hours ending 11/27/23 1911    11/27/2023    2:18 PM 11/23/2023    2:58 PM 11/10/2023    1:05 PM  Last 3 Weights  Weight (lbs) 80 lb 80 lb 83 lb  Weight (kg) 36.288 kg 36.288 kg 37.649 kg     Body mass index is 16.16 kg/m.  General:  frail, chronically ill appearing, in no acute distress  HEENT: normal Neck: no JVD Vascular:  Distal pulses 2+ bilaterally Cardiac:  rapid regular rhythm; no obvious murmur or rubs Lungs:  decreased breath sounds, no obvious rales or wheezes Abd: soft, nontender  Ext: no edema Musculoskeletal:  No deformities Skin: warm and dry  Neuro:  CNs 2-12 intact, no focal abnormalities noted Psych:  Normal affect   EKG:  The EKG was personally reviewed and demonstrates:  see HPI Telemetry:  Telemetry was personally reviewed and demonstrates:  sinus  tachycardia    Laboratory Data: High Sensitivity Troponin:   Recent Labs  Lab 11/27/23 1430 11/27/23 1742  TROPONINIHS 11 13     Chemistry Recent Labs  Lab 11/23/23 1546 11/27/23 1430  NA 128* 133*  K 4.6 5.4*  CL 97 102  CO2 21 22  GLUCOSE 145* 119*  BUN 73* 78*  CREATININE 1.53* 1.51*  CALCIUM 9.8 9.9  GFRNONAA  --  34*  ANIONGAP  --  9    Recent Labs  Lab 11/23/23 1546  PROT 8.0  ALBUMIN  3.7  AST 81*  ALT 231*  ALKPHOS 692*  BILITOT 0.5   Lipids  Recent Labs  Lab 11/23/23 1546  CHOL 186  TRIG 183.0*  HDL 51.70  LDLCALC 98  CHOLHDL 4    Hematology Recent Labs  Lab 11/23/23 1546 11/27/23 1430  WBC 9.5 15.8*  RBC 4.37 4.34  HGB 11.5* 11.5*  HCT 35.8* 36.8  MCV 82.0 84.8  MCH  --  26.5  MCHC 32.0 31.3  RDW 16.1* 16.1*  PLT 476.0* 544*   Thyroid  No results for input(s): TSH, FREET4 in the last 168 hours.  BNP Recent Labs  Lab 11/23/23 1546 11/27/23 1430  BNP  --  60.1  PROBNP 45.0  --     DDimer  Recent Labs  Lab 11/23/23 1546  DDIMER 1.15*    Radiology/Studies:  ECHOCARDIOGRAM COMPLETE Result Date: 11/27/2023    ECHOCARDIOGRAM REPORT   Patient Name:   CARLEEN RHUE Date of Exam: 11/27/2023 Medical Rec #:  [id number]    Height:       59.0 in Accession #:    [id number]   Weight:       80.0 lb Date of Birth:  22-May-1941     BSA:          1.249 m Patient Age:    83 years     BP:           146/90 mmHg Patient Gender: F            HR:           123 bpm. Exam Location:  Inpatient Procedure: 2D Echo (Both Spectral and Color Flow Doppler were utilized during            procedure). STAT ECHO Indications:    pericardial effusion  History:        Patient has no prior history of Echocardiogram examinations.                 COPD and chronic kidney disease, Signs/Symptoms:Shortness of                 Breath; Risk Factors:Hypertension and Dyslipidemia.  Sonographer:    Tinnie Barefoot RDCS Referring Phys: [id number] GLENDIA DASEN WEAVER IMPRESSIONS  1. Left  ventricular ejection fraction, by estimation, is >75%. The left ventricle has hyperdynamic function. The left ventricle has no regional wall motion abnormalities. Left ventricular diastolic function could not be evaluated.  2. Right ventricular systolic function is normal. The right ventricular size is normal.  3. Moderate size pericardial effusion, circumferentially, predominantly located anterior to right heart chambers and apex. As per the inflow variations, IVC size, no obvious evidence of tamponade physiology. Recommend limited echocardiogram in 24 hours to reevaluate size of pericardial effusion and its hemodynamic significance.  4. The mitral valve is degenerative. Trivial mitral valve regurgitation. No evidence of mitral stenosis.  5. The aortic valve is tricuspid. Aortic valve regurgitation is not visualized. Aortic valve sclerosis is present, with no evidence of aortic valve stenosis.  6. The  inferior vena cava is normal in size with <50% respiratory variability, suggesting right atrial pressure of 8 mmHg. Comparison(s): No prior Echocardiogram. Conclusion(s)/Recommendation(s): Repeat Limited echo in 24hr to evaluate the pericardial effusion and its hemodynamic significance. FINDINGS  Left Ventricle: Left ventricular ejection fraction, by estimation, is >75%. The left ventricle has hyperdynamic function. The left ventricle has no regional wall motion abnormalities. The left ventricular internal cavity size was small. There is no left  ventricular hypertrophy. Left ventricular diastolic function could not be evaluated due to nondiagnostic images. Left ventricular diastolic function could not be evaluated. Right Ventricle: The right ventricular size is normal. No increase in right ventricular wall thickness. Right ventricular systolic function is normal. Left Atrium: Left atrial size was normal in size. Right Atrium: Right atrial size was normal in size. Pericardium: Moderate size pericardial effusion,  circumferentially, predominantly located anterior to right heart chambers and apex. As per the inflow variations, IVC size, no obvious evidence of tamponade physiology. Recommend limited echocardiogram in 24 hours to reevaluate size of pericardial effusion and its hemodynamic significance. Mitral Valve: The mitral valve is degenerative in appearance. Mild mitral annular calcification. Trivial mitral valve regurgitation. No evidence of mitral valve stenosis. Tricuspid Valve: The tricuspid valve is grossly normal. Tricuspid valve regurgitation is not demonstrated. No evidence of tricuspid stenosis. Aortic Valve: The aortic valve is tricuspid. Aortic valve regurgitation is not visualized. Aortic valve sclerosis is present, with no evidence of aortic valve stenosis. Pulmonic Valve: The pulmonic valve was grossly normal. Pulmonic valve regurgitation is not visualized. Aorta: The ascending aorta was not well visualized. Venous: The inferior vena cava is normal in size with less than 50% respiratory variability, suggesting right atrial pressure of 8 mmHg. IAS/Shunts: The interatrial septum was not well visualized.  LEFT VENTRICLE PLAX 2D LVIDd:         3.50 cm LVIDs:         2.00 cm LV PW:         1.00 cm LV IVS:        0.80 cm LVOT diam:     1.90 cm LV SV:         27 LV SV Index:   21 LVOT Area:     2.84 cm  RIGHT VENTRICLE             IVC RV Basal diam:  2.30 cm     IVC diam: 1.50 cm RV S prime:     22.80 cm/s TAPSE (M-mode): 2.4 cm LEFT ATRIUM           Index        RIGHT ATRIUM          Index LA diam:      2.40 cm 1.92 cm/m   RA Area:     9.22 cm LA Vol (A4C): 15.8 ml 12.65 ml/m  RA Volume:   19.00 ml 15.21 ml/m  AORTIC VALVE LVOT Vmax:   73.70 cm/s LVOT Vmean:  48.000 cm/s LVOT VTI:    0.095 m  AORTA Ao Asc diam: 2.80 cm MV E velocity: 60.90 cm/s MV A velocity: 122.00 cm/s  SHUNTS MV E/A ratio:  0.50         Systemic VTI:  0.09 m                             Systemic Diam: 1.90 cm Ezelle Surprenant Electronically signed  by Madonna Large Signature Date/Time: 11/27/2023/6:31:40 PM  Final    DG Chest Port 1 View Result Date: 11/27/2023 CLINICAL DATA:  Shortness of breath. EXAM: PORTABLE CHEST 1 VIEW COMPARISON:  11/23/2023, 11/26/2023. FINDINGS: The heart size and mediastinal contours are stable. Atherosclerotic calcification of the aorta is noted. Widening of the mediastinum is unchanged. Emphysematous changes are present in the lungs. There is a small pleural effusion on the right a moderate pleural effusion on the left with atelectasis at the lung bases. No pneumothorax is seen. No acute osseous abnormality. Surgical clips are present in the left axilla. IMPRESSION: 1. Moderate pleural effusion on the right and small pleural effusion on the left with atelectasis at the lung bases. 2. Emphysema. Electronically Signed   By: Leita Birmingham M.D.   On: 11/27/2023 15:15   CT Angio Chest W/Cm &/Or Wo Cm Result Date: 11/26/2023 CLINICAL DATA:  Concern for pulmonary embolism. EXAM: CT ANGIOGRAPHY CHEST WITH CONTRAST TECHNIQUE: Multidetector CT imaging of the chest was performed using the standard protocol during bolus administration of intravenous contrast. Multiplanar CT image reconstructions and MIPs were obtained to evaluate the vascular anatomy. RADIATION DOSE REDUCTION: This exam was performed according to the departmental dose-optimization program which includes automated exposure control, adjustment of the mA and/or kV according to patient size and/or use of iterative reconstruction technique. CONTRAST:  60mL OMNIPAQUE  IOHEXOL  300 MG/ML  SOLN COMPARISON:  Chest radiograph dated 11/23/2023. FINDINGS: Evaluation is limited due to cachexia and severe streak artifact caused by contrast as well as respiratory motion. Cardiovascular: There is no cardiomegaly. Moderate pericardial effusion measuring 18 mm in thickness and new since the prior CT. Correlation with echocardiogram recommended to exclude cardiac tamponade. There is moderate  atherosclerotic calcification of the thoracic aorta. The aorta is tortuous. No aneurysmal dilatation or dissection. The origins of the great vessels of the aortic arch appear patent. No pulmonary artery embolus identified. Mediastinum/Nodes: Ill-defined infiltrative soft tissue throughout the mediastinum. There is an ill-defined masslike area in the mediastinum with splaying of the central pulmonary arteries measuring 3 x 4 cm (63/5). There is lobulated thickening of the soft tissues in the pre-vascular space as well as right suprahilar mediastinum. The esophagus is poorly visualized. Lungs/Pleura: Moderate bilateral pleural effusions with partial compressive atelectasis of the lower lobes. There is background of emphysema. Streaky and nodular density extending from the right hilum to the right apex. A nodular component measures 1.7 x 0.5 cm and new since the prior CT. There is no pneumothorax. Secretions noted in the right mainstem bronchus. The central airways are patent. Upper Abdomen: No acute abnormality. Musculoskeletal: Osteopenia with degenerative changes of the spine. Cachexia. No acute osseous pathology. Review of the MIP images confirms the above findings. IMPRESSION: 1. No CT evidence of pulmonary artery embolus. 2. Moderate bilateral pleural effusions with partial compressive atelectasis of the lower lobes. 3. Moderate pericardial effusion, new since the prior CT. Correlation with echocardiogram recommended to exclude cardiac tamponade. 4. Ill-defined infiltrative soft tissue throughout the mediastinum with an ill-defined masslike area in the mediastinum with splaying of the central pulmonary arteries. Findings are concerning for neoplastic process. 5. Streaky and nodular density extending from the right hilum to the right apex, new since the prior CT. Multidisciplinary consult is advised. 6. Aortic Atherosclerosis (ICD10-I70.0) and Emphysema (ICD10-J43.9). Electronically Signed   By: Vanetta Chou  M.D.   On: 11/26/2023 15:35    Assessment and Plan: 1. Pericardial effusion She was recently seen for worsening shortness of breath and probable AECOPD.  She has a hx of  breast CA and long history of smoking, quit 2 months ago. Recent alk phos is significantly elevated as well as ALT. Na has been low as well.  Chest CT is suspicious for neoplastic process.  Pericardial effusion concerning for malignant effusion. Echocardiogram has been completed.  Results forthcoming.  2. Mediastinal mass 3. Bilateral pleural effusions 3. COPD exacerbation BNP, Troponin both normal. Echocardiogram pending. Workup ongoing by internal medicine service, CCM and CT surgery. Pt being managed with IV diuresis for now for pleural effusions with plans to pursue thoracentesis if not improving.    Risk Assessment/Risk Scores:      For questions or updates, please contact [facility] Please consult [email or web address] for contact info under   Signed, Glendia Ferrier, PA-C  11/27/2023 6:11 PM  ADDENDUM:   Patient seen and examined with Glendia Ferrier.  I personally taken a history, examined the patient, reviewed relevant notes,  laboratory data / imaging studies.  I performed a substantive portion of this encounter and formulated the important aspects of the plan.  I agree with the APP's note, impression, and recommendations; however, I have edited the note to reflect changes or salient points.   83 year old Caucasian female presents to the ED for shortness of breath evaluation at the request of her PCP.  At the time of evaluation she is accompanied by her daughter at bedside.  She has been having shortness of breath for quite some time now.  Initially treated with inhalers/albuterol  thinking and COPD exacerbation as well as a trial of antibiotics.  Due to no significant resolution she had a CT chest ordered by PCP as outpatient.  She was called to come into the ED due to concerns for moderate bilateral  pleural effusions with compressive atelectasis, moderate pericardial effusion, ill-defined infiltrative soft tissue throughout the mediastinum concerning for neoplastic process.  Cardiology asked to evaluate the patient with regards to pericardial effusion.  At the time of evaluation patient is sitting in a 45 degree angle, 2 L nasal cannula oxygen, able to provide a history, tachycardic on telemetry but maintaining a pressures greater than 140 mmHg.  Patient denies anginal chest pain, pleuritic or positional discomfort.  No recent sick contacts, travels, no history of tuberculosis per patient.  She has history of breast cancer for which she underwent a double mastectomy and tamoxifen  for 3 years, surveillance stopped approximately 14 years ago according to the patient.  Additionally, she is a heavy smoker who quit approximately 2 months ago around April 2025.  Patient and daughter notify me that she has lost approximately 10 pounds of weight over the last 1 month, unintentional.  She also has hoarseness in her voice.  PHYSICAL EXAM: Today's Vitals   11/27/23 1545 11/27/23 1600 11/27/23 1615 11/27/23 1735  BP: (!) 148/93 (!) 142/91 (!) 146/90 (!) 122/95  Pulse: (!) 120 (!) 118 (!) 119   Resp: (!) 33 (!) 28 (!) 37 20  Temp:    98.3 F (36.8 C)  TempSrc:    Oral  SpO2: 98% 99% 99%   Weight:      Height:      PainSc:       Body mass index is 16.16 kg/m.   Net IO Since Admission: No IO data has been entered for this period [11/27/23 1911]  Filed Weights   11/27/23 1418  Weight: 36.3 kg    General: Appears older than stated age, temporal wasting, cachectic, HEENT: Normocephalic, atraumatic, JVP, trachea midline Chest/lungs: Bilateral mastectomies, barrel chest, decreased breath  sounds bilaterally, no rales or rhonchi's.  No use of accessory muscles. 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  She was prompted to the ED for further evaluation management.  Clinically patient is in bed resting, maintaining systolic blood pressure greater than 140 mmHg, JVP on examination, frail/cachectic with a BMI of 16, barrel chested, decreased breath sounds bilaterally.  She is accompanied by her daughter at bedside.  Shortness of breath has been ongoing for the last 3 weeks at least and now becoming progressively worse.  Echocardiogram notes hyperdynamic LVEF, at least moderate-sized pericardial effusion, circumferential, predominantly anterior to the right heart chambers and apex, some fibrinous/stranding noted anterior to the right ventricle, based on the inflows, IVC is not dilated hide noted to have minimal compression, no overt findings of tamponade physiology as of now.  However, given her clinical presentation she needs close monitoring and repeat limited echocardiogram in 24 hours for reevaluation.  EKG shows sinus tachycardia.  No evidence of pulses alternans.  No evidence of PR elevation in aVR or PR depressions to suggest pericarditis.  Clinically she denies any chest pain.  Check ESR and CRP for  completeness.  My clinical suspicion is that pericardial effusion is likely malignant given her history of breast cancer and a significant history of cigarette smoking (quit approximately 2 months ago/April 2025), CT findings noting ill-defined infiltrative soft tissue throughout the mediastinum with splaying of the central pulmonary arteries.  Echocardiogram also notes fibrinous/stranding anterior to the right ventricle.  Decision to do pericardiocentesis will be dependent on if she develops hemodynamic compromise or if fluid is needed for diagnostic purposes.  Would recommend medical oncology evaluation given her presentation and history. More importantly goals of care need to be discussed.   Given her age, frailty, cachexia, barrel chest, moderate pericardial and pleural effusion, now on oxygen therapy, and concerns for possible neoplastic process I did speak to the patient and daughter with regards to considering/discussing CODE STATUS.  Cardiology will follow.  CRITICAL CARE Performed by: Madonna Large, DO, [facility]   Total critical care time: 47 minutes   Critical care time was exclusive of separately billable procedures and treating patient.   Critical care was necessary to treat or prevent imminent or life-threatening deterioration as she presents with pericardial effusion concerning for possible hemodynamic compromise.   Critical care was time spent personally by me on the following activities: development of treatment plan with patient and/or surrogate (daughter) as well as nursing, discussions with attending physician (Dr. Rashid)/nursing staff, obtaining stat echo, reviewing CT chest results from 11/26/2023, labs 11/27/2023, EKG 7//2025, evaluation of patient's response to treatment, examination of patient, obtaining history from patient or surrogate, ordering additional testing, and re-evaluation of patient's condition.  Further recommendations to follow as the case evolves.   This note was  created  using a voice recognition software as a result there may be grammatical errors inadvertently enclosed that do not reflect the nature of this encounter. Every attempt is made to correct such errors.   Madonna Michele HAS, [facility]  A Division of [facility] [address], [place], [place] [address]  [place], [place] [address] Pager: [phone] Office: [phone] 11/27/2023 7:11 PM

## 2023-11-27 NOTE — ED Provider Notes (Signed)
 [place] EMERGENCY DEPARTMENT AT [facility] Provider Note   CSN: [id number] Arrival date & time: 11/27/23  1358     Patient presents with: Shortness of Breath   Tammy Wolfe is a 83 y.o. female.   Pt is a 83 yo female with pmhx significant for COPD, breast cancer, htn, breast cancer, arthritis.  Pt presents to the ED today with sob.  She's had sob for a few weeks, but it's gotten worse over the last few days.  Pt did have a CT chest done as an outpatient yesterday which showed a pericardial effusion and bilateral pleural effusions.  Pt was called by the pcp to come to the ED.       Prior to Admission medications   Medication Sig Start Date End Date Taking? Authorizing Provider  acetaminophen  (TYLENOL ) 325 MG tablet Take 650 mg by mouth every 6 (six) hours as needed.    [provider]  albuterol  (VENTOLIN  HFA) 108 (90 Base) MCG/ACT inhaler Inhale 2 puffs into the lungs every 6 (six) hours as needed for wheezing or shortness of breath. 11/23/23   [name] ORN, MD  ALPRAZolam  (XANAX ) 0.25 MG tablet TAKE ONE TABLET THREE TIMES DAILY AS NEEDED 11/10/23   [name] ORN, MD  azithromycin  (ZITHROMAX ) 250 MG tablet Take 2 tablets on day 1, then 1 tablet daily on days 2 through 5 11/23/23 11/28/23  [name] ORN, MD  Cholecalciferol (VITAMIN D ) 1000 UNITS capsule Take 1,000 Units by mouth daily.      [provider]  cyanocobalamin  1000 MCG tablet Take 1,000 mcg by mouth daily.    [provider]  cyclobenzaprine  (FLEXERIL ) 5 MG tablet 1 tab by mouth at bedtime as needed for cramps and back pain 11/10/23   [name] ORN, MD  Fluticasone-Umeclidin-Vilant (TRELEGY ELLIPTA ) 100-62.5-25 MCG/ACT AEPB Inhale 1 puff into the lungs daily. 11/10/23   [name] ORN, MD  HYDROcodone  bit-homatropine [facility]) 5-1.5 MG/5ML syrup Take 5 mLs by mouth every 6 (six) hours as needed for up to 10 days. 11/23/23 12/03/23  [name] ORN, MD  HYDROcodone -acetaminophen  (NORCO/VICODIN)  5-325 MG tablet Take 1 tablet by mouth 2 (two) times daily as needed. 11/10/23   [name] ORN, MD  losartan  (COZAAR ) 100 MG tablet Take 1 tablet (100 mg total) by mouth daily. 11/10/23   [name] ORN, MD  oxymetazoline  (AFRIN) 0.05 % nasal spray Place 1 spray into both nostrils daily as needed for congestion.    [provider]  predniSONE  (DELTASONE ) 10 MG tablet 3 tabs by mouth per day for 3 days,2tabs per day for 3 days,1tab per day for 3 days 11/23/23   [name] ORN, MD  trimethoprim  (TRIMPEX ) 100 MG tablet Take 1 tablet (100 mg total) by mouth daily. 11/10/23   [name] ORN, MD    Allergies: Tetracycline, Lovastatin , and Pravastatin     Review of Systems  Respiratory:  Positive for shortness of breath.   All other systems reviewed and are negative.   Updated Vital Signs BP (!) 142/91   Pulse (!) 118   Temp 98 F (36.7 C)   Resp (!) 28   Ht 4' 11 (1.499 m)   Wt 36.3 kg   SpO2 99%   BMI 16.16 kg/m   Physical Exam Vitals and nursing note reviewed.  Constitutional:      General: She is in acute distress.     Appearance: She is underweight.  HENT:     Head: Normocephalic  and atraumatic.     Mouth/Throat:     Mouth: Mucous membranes are moist.  Eyes:     Extraocular Movements: Extraocular movements intact.     Pupils: Pupils are equal, round, and reactive to light.  Cardiovascular:     Rate and Rhythm: Regular rhythm. Tachycardia present.  Pulmonary:     Effort: Tachypnea present.  Abdominal:     General: Bowel sounds are normal.     Palpations: Abdomen is soft.  Musculoskeletal:        General: Normal range of motion.     Cervical back: Normal range of motion and neck supple.  Skin:    General: Skin is warm.     Capillary Refill: Capillary refill takes less than 2 seconds.  Neurological:     General: No focal deficit present.     Mental Status: She is alert and oriented to person, place, and time.  Psychiatric:        Mood and Affect: Mood normal.         Behavior: Behavior normal.     (all labs ordered are listed, but only abnormal results are displayed) Labs Reviewed  BASIC METABOLIC PANEL WITH GFR - Abnormal; Notable for the following components:      Result Value   Sodium 133 (*)    Potassium 5.4 (*)    Glucose, Bld 119 (*)    BUN 78 (*)    Creatinine, Ser 1.51 (*)    GFR, Estimated 34 (*)    All other components within normal limits  CBC WITH DIFFERENTIAL/PLATELET - Abnormal; Notable for the following components:   WBC 15.8 (*)    Hemoglobin 11.5 (*)    RDW 16.1 (*)    Platelets 544 (*)    Neutro Abs 14.3 (*)    Lymphs Abs 0.4 (*)    Abs Immature Granulocytes 0.13 (*)    All other components within normal limits  BRAIN NATRIURETIC PEPTIDE  TROPONIN I (HIGH SENSITIVITY)  TROPONIN I (HIGH SENSITIVITY)    EKG: EKG Interpretation Date/Time:  Friday November 27 2023 14:14:26 EDT Ventricular Rate:  129 PR Interval:  140 QRS Duration:  70 QT Interval:  288 QTC Calculation: 421 R Axis:   65  Text Interpretation: Sinus tachycardia Septal infarct , age undetermined Abnormal ECG When compared with ECG of 06-Dec-2010 10:26, PREVIOUS ECG IS PRESENT Since last tracing rate faster Confirmed by Dean Clarity [phone]) on 11/27/2023 2:22:05 PM  Radiology: [facility] Chest Port 1 View Result Date: 11/27/2023 CLINICAL DATA:  Shortness of breath. EXAM: PORTABLE CHEST 1 VIEW COMPARISON:  11/23/2023, 11/26/2023. FINDINGS: The heart size and mediastinal contours are stable. Atherosclerotic calcification of the aorta is noted. Widening of the mediastinum is unchanged. Emphysematous changes are present in the lungs. There is a small pleural effusion on the right a moderate pleural effusion on the left with atelectasis at the lung bases. No pneumothorax is seen. No acute osseous abnormality. Surgical clips are present in the left axilla. IMPRESSION: 1. Moderate pleural effusion on the right and small pleural effusion on the left with atelectasis at the lung  bases. 2. Emphysema. Electronically Signed   By: Leita Birmingham M.D.   On: 11/27/2023 15:15   CT Angio Chest W/Cm &/Or Wo Cm Result Date: 11/26/2023 CLINICAL DATA:  Concern for pulmonary embolism. EXAM: CT ANGIOGRAPHY CHEST WITH CONTRAST TECHNIQUE: Multidetector CT imaging of the chest was performed using the standard protocol during bolus administration of intravenous contrast. Multiplanar CT image reconstructions and MIPs  were obtained to evaluate the vascular anatomy. RADIATION DOSE REDUCTION: This exam was performed according to the departmental dose-optimization program which includes automated exposure control, adjustment of the mA and/or kV according to patient size and/or use of iterative reconstruction technique. CONTRAST:  60mL OMNIPAQUE  IOHEXOL  300 MG/ML  SOLN COMPARISON:  Chest radiograph dated 11/23/2023. FINDINGS: Evaluation is limited due to cachexia and severe streak artifact caused by contrast as well as respiratory motion. Cardiovascular: There is no cardiomegaly. Moderate pericardial effusion measuring 18 mm in thickness and new since the prior CT. Correlation with echocardiogram recommended to exclude cardiac tamponade. There is moderate atherosclerotic calcification of the thoracic aorta. The aorta is tortuous. No aneurysmal dilatation or dissection. The origins of the great vessels of the aortic arch appear patent. No pulmonary artery embolus identified. Mediastinum/Nodes: Ill-defined infiltrative soft tissue throughout the mediastinum. There is an ill-defined masslike area in the mediastinum with splaying of the central pulmonary arteries measuring 3 x 4 cm (63/5). There is lobulated thickening of the soft tissues in the pre-vascular space as well as right suprahilar mediastinum. The esophagus is poorly visualized. Lungs/Pleura: Moderate bilateral pleural effusions with partial compressive atelectasis of the lower lobes. There is background of emphysema. Streaky and nodular density extending  from the right hilum to the right apex. A nodular component measures 1.7 x 0.5 cm and new since the prior CT. There is no pneumothorax. Secretions noted in the right mainstem bronchus. The central airways are patent. Upper Abdomen: No acute abnormality. Musculoskeletal: Osteopenia with degenerative changes of the spine. Cachexia. No acute osseous pathology. Review of the MIP images confirms the above findings. IMPRESSION: 1. No CT evidence of pulmonary artery embolus. 2. Moderate bilateral pleural effusions with partial compressive atelectasis of the lower lobes. 3. Moderate pericardial effusion, new since the prior CT. Correlation with echocardiogram recommended to exclude cardiac tamponade. 4. Ill-defined infiltrative soft tissue throughout the mediastinum with an ill-defined masslike area in the mediastinum with splaying of the central pulmonary arteries. Findings are concerning for neoplastic process. 5. Streaky and nodular density extending from the right hilum to the right apex, new since the prior CT. Multidisciplinary consult is advised. 6. Aortic Atherosclerosis (ICD10-I70.0) and Emphysema (ICD10-J43.9). Electronically Signed   By: Vanetta Chou M.D.   On: 11/26/2023 15:35     Procedures   Medications Ordered in the ED - No data to display                                  Medical Decision Making Amount and/or Complexity of Data Reviewed Labs: ordered. Radiology: ordered.   This patient presents to the ED for concern of sob, this involves an extensive number of treatment options, and is a complaint that carries with it a high risk of complications and morbidity.  The differential diagnosis includes copd exac, pe, pleural effusions, pericardial effusions   Co morbidities that complicate the patient evaluation  COPD, breast cancer, htn, breast cancer, arthritis   Additional history obtained:  Additional history obtained from epic chart review External records from outside source  obtained and reviewed including daughter   Lab Tests:  I Ordered, and personally interpreted labs.  The pertinent results include:  cbc with wbc elevated at 15.8, cmp with bun 78 and cr 1.51 (stable); trop nl; bnp nl   Imaging Studies ordered:  I ordered imaging studies including cxr  I independently visualized and interpreted imaging which showed  Moderate pleural effusion on the right and small pleural effusion  on the left with atelectasis at the lung bases.  2. Emphysema.  I reviewed CT from yesterday:   IMPRESSION: 1. No CT evidence of pulmonary artery embolus. 2. Moderate bilateral pleural effusions with partial compressive atelectasis of the lower lobes. 3. Moderate pericardial effusion, new since the prior CT. Correlation with echocardiogram recommended to exclude cardiac tamponade. 4. Ill-defined infiltrative soft tissue throughout the mediastinum with an ill-defined masslike area in the mediastinum with splaying of the central pulmonary arteries. Findings are concerning for neoplastic process. 5. Streaky and nodular density extending from the right hilum to the right apex, new since the prior CT. Multidisciplinary consult is advised. 6. Aortic Atherosclerosis (ICD10-I70.0) and Emphysema (ICD10-J43.9).   I agree with the radiologist interpretation   Cardiac Monitoring:  The patient was maintained on a cardiac monitor.  I personally viewed and interpreted the cardiac monitored which showed an underlying rhythm of: st   Medicines ordered and prescription drug management:   I have reviewed the patients home medicines and have made adjustments as needed   Test Considered:  ECHO   Critical Interventions:  ECHO   Consultations Obtained:  I requested consultation with the CTS (Dr. Lucas),  and discussed lab and imaging findings as well as pertinent plan - he requests cards eval and formal ECHO and will see in consult Pt d/w Dr. Stacia (cards) who  will see pt and get a formal ECHO. Pt d/w Dr. Dino (triad) for admission.    Problem List / ED Course:  Bilateral pleural effusions:  with lung mass, these effusions are likely malignant.  Pt will need thoracentesis. Pericardial effusion:  bedside US  does look like tamponade with RA and RV collapse.  Cards/CTS consulted. Lung mass:  new dx.  Will need further eval.   Reevaluation:  After the interventions noted above, I reevaluated the patient and found that they have :improved   Social Determinants of Health:  Lives at home   Dispostion:  After consideration of the diagnostic results and the patients response to treatment, I feel that the patent would benefit from admission.    CRITICAL CARE Performed by: Mliss Boyers   Total critical care time: 30 minutes  Critical care time was exclusive of separately billable procedures and treating other patients.  Critical care was necessary to treat or prevent imminent or life-threatening deterioration.  Critical care was time spent personally by me on the following activities: development of treatment plan with patient and/or surrogate as well as nursing, discussions with consultants, evaluation of patient's response to treatment, examination of patient, obtaining history from patient or surrogate, ordering and performing treatments and interventions, ordering and review of laboratory studies, ordering and review of radiographic studies, pulse oximetry and re-evaluation of patient's condition.      Final diagnoses:  Pleural effusion, bilateral  Pericardial effusion  Lung mass    ED Discharge Orders     None          Boyers Mliss, MD 11/27/23 1624

## 2023-11-27 NOTE — Plan of Care (Signed)

## 2023-11-27 NOTE — Consult Note (Signed)
 NAME:  Tammy Wolfe, MRN:  [id number], DOB:  Oct 22, 1940, LOS: 0 ADMISSION DATE:  11/27/2023, CONSULTATION DATE:  11/27/23 REFERRING MD:  TRH, CHIEF COMPLAINT:  DOE/SOB   History of Present Illness:  83 year old severe emphysema presents to the ED at the direction of PCP with abnormal CT findings.  Diagnosis COPD Per PCP notes.  I see no pulmonary function test to confirm or validate this diagnosis.  Recently treated with exacerbation per PCP note.  On Trelegy at baseline.  CTA PE protocol was obtained.  No PE seen but did demonstrate bilateral moderate pleural effusions as well as pericardial effusion and concern for mediastinal mass.  Severe emphysematous changes noted.  The pericardial effusion and pleural effusions prompted recommendation to go to the ED.  She is tachycardic.  Normotensive.  On room air satting high 90s per initial flowsheet data..  She was placed on nasal cannula shortly thereafter.  Sats in the high 90s.  She endorses shortness of breath.  Dyspnea exertion. Feels better with O2.  Endorses 10 pound weight loss over the last 1 month.  Relatively sudden onset of breathing decompensation over the last few weeks.  Pertinent  Medical History    Significant Hospital Events: Including procedures, antibiotic start and stop dates in addition to other pertinent events     Interim History / Subjective:    Objective    Blood pressure (!) 122/95, pulse (!) 119, temperature 98.3 F (36.8 C), temperature source Oral, resp. rate 20, height 4' 11 (1.499 m), weight 36.3 kg, SpO2 99%.       No intake or output data in the 24 hours ending 11/27/23 1758 Filed Weights   11/27/23 1418  Weight: 36.3 kg    Examination: General: Frail-appearing lying in bed HENT: Traumatic normocephalic Lungs: Diminished bilaterally Cardiovascular: Tachycardic warm Abdomen: Not distended Extremities: No edema Neuro: No deficits, alert  Resolved problem list   Assessment and Plan   Bilateral  pleural effusions: Mild interlobular septal thickening.  Suspect these are related to cardiogenic volume overload.  BNP is not elevated but does not fit pattern but does not rule out cardiogenic volume overload.  Would be unusual to have bilateral malignant effusions but I this is possible, especially given clinical history of 10 pound weight loss and pericardial effusion. -- Recommend aggressive IV diuresis as blood pressure and kidney function tolerates -- If not improving over the next 48 to 72 hours would consider thoracentesis for diagnostic purposes  Mediastinal mass: Concern for malignancy.  Given her frailty, could argue no further workup but will need to be discussed with patient and assess her goals of care. -- Ideally to be worked up as an outpatient -- Consider thoracentesis for diagnostic purposes and therapeutic purposes in the future, pericardial drainage (if warranted) if results of malignant cells would suffice for diagnostic purposes  Right upper lobe linear density/nodule: Unclear if related to mediastinal process or not. -- Ideally outpatient workup and biopsy if tolerated, she is quite frail  Severe emphysematous changes: No PFTs to review. -- Recommend ICS LAMA LABA via budesonide  twice daily, arformoterol  twice daily, Yupelri  once daily   Tachycardia: Suspect related to pericardial effusion.  She is not hypotensive, not clinically in tamponade. -- Consider pericardiocentesis to see if this aids in tachycardia as well as for diagnostic purposes, would send fluid for cytology  PCCM is available as needed.  Please contact us  if additional services are required.  Best Practice (right click and Reselect all SmartList Selections daily)  Per primary  Labs   CBC: Recent Labs  Lab 11/23/23 1546 11/27/23 1430  WBC 9.5 15.8*  NEUTROABS 8.0* 14.3*  HGB 11.5* 11.5*  HCT 35.8* 36.8  MCV 82.0 84.8  PLT 476.0* 544*    Basic Metabolic Panel: Recent Labs  Lab  11/23/23 1546 11/27/23 1430  NA 128* 133*  K 4.6 5.4*  CL 97 102  CO2 21 22  GLUCOSE 145* 119*  BUN 73* 78*  CREATININE 1.53* 1.51*  CALCIUM 9.8 9.9   GFR: Estimated Creatinine Clearance: 16.2 mL/min (A) (by C-G formula based on SCr of 1.51 mg/dL (H)). Recent Labs  Lab 11/23/23 1546 11/27/23 1430  WBC 9.5 15.8*    Liver Function Tests: Recent Labs  Lab 11/23/23 1546  AST 81*  ALT 231*  ALKPHOS 692*  BILITOT 0.5  PROT 8.0  ALBUMIN  3.7   No results for input(s): LIPASE, AMYLASE in the last 168 hours. No results for input(s): AMMONIA in the last 168 hours.  ABG No results found for: PHART, PCO2ART, PO2ART, HCO3, TCO2, ACIDBASEDEF, O2SAT   Coagulation Profile: No results for input(s): INR, PROTIME in the last 168 hours.  Cardiac Enzymes: No results for input(s): CKTOTAL, CKMB, CKMBINDEX, TROPONINI in the last 168 hours.  HbA1C: Hgb A1c MFr Bld  Date/Time Value Ref Range Status  11/23/2023 03:46 PM 6.5 4.6 - 6.5 % Final    Comment:    Glycemic Control Guidelines for People with Diabetes:Non Diabetic:  <6%Goal of Therapy: <7%Additional Action Suggested:  >8%   11/10/2023 01:56 PM 6.5 4.6 - 6.5 % Final    Comment:    Glycemic Control Guidelines for People with Diabetes:Non Diabetic:  <6%Goal of Therapy: <7%Additional Action Suggested:  >8%     CBG: No results for input(s): GLUCAP in the last 168 hours.  Review of Systems:   N/a  Past Medical History:  She,  has a past medical history of ABNORMAL THYROID  FUNCTION TESTS (12/10/2009), ALCOHOLIC HEPATITIS, HX OF (02/14/2007), ALLERGIC RHINITIS (02/14/2007), Anemia, unspecified (04/26/2012), ANXIETY (03/19/2007), Breast cancer (HCC), Breast implant removal status (12/23/2008), COLONIC POLYPS, HX OF (02/14/2007), COPD (chronic obstructive pulmonary disease) (HCC), Coronary artery calcification seen on CT scan (10/31/2013), GOITER, MULTINODULAR (02/14/2007), HX, PERSONAL, ALCOHOLISM  (02/14/2007), HYPERLIPIDEMIA (02/14/2007), HYPERTENSION (02/14/2007), Insomnia, OSTEOARTHRITIS (02/14/2007), OSTEOPOROSIS (02/14/2007), PANCREATITIS, HX OF (02/14/2007), and Smoker (09/06/2013).   Surgical History:   Past Surgical History:  Procedure Laterality Date   BREAST SURGERY  2010   silicone prepectoral implants explanted by Dr. Marcus after rupture   BREAST SURGERY     left simple mastectomy and left axillary sentinel node biopsy, right prophylactic mastectomy   CATARACT EXTRACTION     NASAL SINUS SURGERY Left 06/14/2021   Procedure: ENDOSCOPIC MAXILLARY ANTROSTOMY WITH REMOVAL OF TISSUE;  Surgeon: Jesus Oliphant, MD;  Location: [facility] OR;  Service: ENT;  Laterality: Left;   peridontal  2010   s/p jaw and periodontal surgury  2009     Social History:   reports that she quit smoking about 2 years ago. Her smoking use included cigarettes. She has never used smokeless tobacco. She reports that she does not currently use alcohol. She reports that she does not use drugs.   Family History:  Her family history includes Hyperlipidemia in her brother. She was adopted.   Allergies Allergies  Allergen Reactions   Tetracycline Anaphylaxis   Lovastatin  Other (See Comments)    Muscle cramps   Pravastatin  Other (See Comments)    Leg cramps     Home  Medications  Prior to Admission medications   Medication Sig Start Date End Date Taking? Authorizing Provider  acetaminophen  (TYLENOL ) 325 MG tablet Take 650 mg by mouth every 6 (six) hours as needed.    [provider]  albuterol  (VENTOLIN  HFA) 108 (90 Base) MCG/ACT inhaler Inhale 2 puffs into the lungs every 6 (six) hours as needed for wheezing or shortness of breath. 11/23/23   [name] ORN, MD  ALPRAZolam  (XANAX ) 0.25 MG tablet TAKE ONE TABLET THREE TIMES DAILY AS NEEDED 11/10/23   [name] ORN, MD  azithromycin  (ZITHROMAX ) 250 MG tablet Take 2 tablets on day 1, then 1 tablet daily on days 2 through 5 11/23/23 11/28/23  [name] ORN, MD  Cholecalciferol (VITAMIN D ) 1000 UNITS capsule Take 1,000 Units by mouth daily.      [provider]  cyanocobalamin  1000 MCG tablet Take 1,000 mcg by mouth daily.    [provider]  cyclobenzaprine  (FLEXERIL ) 5 MG tablet 1 tab by mouth at bedtime as needed for cramps and back pain 11/10/23   [name] ORN, MD  Fluticasone-Umeclidin-Vilant (TRELEGY ELLIPTA ) 100-62.5-25 MCG/ACT AEPB Inhale 1 puff into the lungs daily. 11/10/23   [name] ORN, MD  HYDROcodone  bit-homatropine [facility]) 5-1.5 MG/5ML syrup Take 5 mLs by mouth every 6 (six) hours as needed for up to 10 days. 11/23/23 12/03/23  [name] ORN, MD  HYDROcodone -acetaminophen  (NORCO/VICODIN) 5-325 MG tablet Take 1 tablet by mouth 2 (two) times daily as needed. 11/10/23   [name] ORN, MD  losartan  (COZAAR ) 100 MG tablet Take 1 tablet (100 mg total) by mouth daily. 11/10/23   [name] ORN, MD  oxymetazoline  (AFRIN) 0.05 % nasal spray Place 1 spray into both nostrils daily as needed for congestion.    [provider]  predniSONE  (DELTASONE ) 10 MG tablet 3 tabs by mouth per day for 3 days,2tabs per day for 3 days,1tab per day for 3 days 11/23/23   [name] ORN, MD  trimethoprim  (TRIMPEX ) 100 MG tablet Take 1 tablet (100 mg total) by mouth daily. 11/10/23   [name] ORN, MD     Critical care time: n/a      Donnice JONELLE Beals, MD See TRACEY

## 2023-11-28 ENCOUNTER — Inpatient Hospital Stay (HOSPITAL_COMMUNITY)

## 2023-11-28 DIAGNOSIS — I3139 Other pericardial effusion (noninflammatory): Secondary | ICD-10-CM | POA: Diagnosis not present

## 2023-11-28 LAB — CBC WITH DIFFERENTIAL/PLATELET
Abs Immature Granulocytes: 0.07 K/uL (ref 0.00–0.07)
Basophils Absolute: 0 K/uL (ref 0.0–0.1)
Basophils Relative: 0 %
Eosinophils Absolute: 0 K/uL (ref 0.0–0.5)
Eosinophils Relative: 0 %
HCT: 35.8 % — ABNORMAL LOW (ref 36.0–46.0)
Hemoglobin: 11.5 g/dL — ABNORMAL LOW (ref 12.0–15.0)
Immature Granulocytes: 1 %
Lymphocytes Relative: 6 %
Lymphs Abs: 0.7 K/uL (ref 0.7–4.0)
MCH: 26.7 pg (ref 26.0–34.0)
MCHC: 32.1 g/dL (ref 30.0–36.0)
MCV: 83.3 fL (ref 80.0–100.0)
Monocytes Absolute: 1 K/uL (ref 0.1–1.0)
Monocytes Relative: 8 %
Neutro Abs: 10.7 K/uL — ABNORMAL HIGH (ref 1.7–7.7)
Neutrophils Relative %: 85 %
Platelets: 535 K/uL — ABNORMAL HIGH (ref 150–400)
RBC: 4.3 MIL/uL (ref 3.87–5.11)
RDW: 16.1 % — ABNORMAL HIGH (ref 11.5–15.5)
WBC: 12.5 K/uL — ABNORMAL HIGH (ref 4.0–10.5)
nRBC: 0 % (ref 0.0–0.2)

## 2023-11-28 LAB — COMPREHENSIVE METABOLIC PANEL WITH GFR
ALT: 108 U/L — ABNORMAL HIGH (ref 0–44)
AST: 47 U/L — ABNORMAL HIGH (ref 15–41)
Albumin: 3.1 g/dL — ABNORMAL LOW (ref 3.5–5.0)
Alkaline Phosphatase: 299 U/L — ABNORMAL HIGH (ref 38–126)
Anion gap: 10 (ref 5–15)
BUN: 85 mg/dL — ABNORMAL HIGH (ref 8–23)
CO2: 19 mmol/L — ABNORMAL LOW (ref 22–32)
Calcium: 9.3 mg/dL (ref 8.9–10.3)
Chloride: 104 mmol/L (ref 98–111)
Creatinine, Ser: 1.61 mg/dL — ABNORMAL HIGH (ref 0.44–1.00)
GFR, Estimated: 32 mL/min — ABNORMAL LOW (ref >60)
Glucose, Bld: 89 mg/dL (ref 70–99)
Potassium: 5 mmol/L (ref 3.5–5.1)
Sodium: 133 mmol/L — ABNORMAL LOW (ref 135–145)
Total Bilirubin: 0.6 mg/dL (ref 0.0–1.2)
Total Protein: 7.5 g/dL (ref 6.5–8.1)

## 2023-11-28 LAB — ECHOCARDIOGRAM LIMITED
Height: 59 in
Weight: 1280 [oz_av]

## 2023-11-28 LAB — MAGNESIUM: Magnesium: 3.2 mg/dL — ABNORMAL HIGH (ref 1.7–2.4)

## 2023-11-28 LAB — VITAMIN D 25 HYDROXY (VIT D DEFICIENCY, FRACTURES): Vit D, 25-Hydroxy: 70.08 ng/mL (ref 30–100)

## 2023-11-28 NOTE — Progress Notes (Signed)
 PROGRESS NOTE    Tammy Wolfe  FMW:[id number] DOB: 1940-09-15 DOA: 11/27/2023 PCP: [name] ORN, MD   Brief Narrative:  83 years old female with past medical history of breast cancer status post double mastectomy, COPD, tobacco abuse and essential hypertension, brought to the ED with shortness of breath, found to have moderate pericardial effusion as well as bilateral pleural effusions and possible mediastinal/lung malignancy.  Cardiology and pulmonology on board.   Assessment & Plan:  Principal Problem:   Pericardial effusion Active Problems:   Pleural effusion, bilateral   Recent unexplained weight loss   Hoarse voice quality   Cigarette smoker   Chronic obstructive pulmonary disease with acute exacerbation (HCC)   HX: breast cancer   83 years old female with past medical history of breast cancer status post double mastectomy, COPD, tobacco abuse and essential hypertension, brought to the ED with shortness of breath.     Acute respiratory failure with hypoxia, POA: Requiring 3 L of oxygen.  This in the setting of bilateral effusion, pericardial effusion as well as acute exacerbation of COPD and possible bacterial pneumonia/lung mediastinal malignancy.   Bilateral pleural effusions, POA: Unclear etiology at this time.  This may be infectious versus malignant.  Continue with intravenous ceftriaxone  and azithromycin  for possibility of bacterial pneumonia leading to pleural effusions but since this is bilateral pleural effusions, malignancy has to be ruled out.  Pulmonology on board, patient assistance.  Patient may need thoracentesis for both diagnostic and therapeutic purposes, although cytology yield is low on pleural fluid.  Continue with the nasal oxygen cannula. Case discussed with Dr Annella in length.   Pericardial effusion with concern for pericardial tamponade, POA: Cardiology has been consulted.  Follow-up echocardiogram-no evidence of cardiac tamponade.  Continue cardiac  monitoring closely.  Patient is tachycardic but not hypotensive.  She does have muffled heart sounds on cardiac auscultation.  Etiology of the pericardial effusion is unclear at this time but  malignant pericardial effusion is high on the differentials.  Rest of the management as per cardiology. TSH on 6/17 is normal. Case discussed with Dr Lavona and Dr Annella and seems like pericardial window with biopsy of the mediastinal mass would the route to go.    Acute exacerbation of COPD, POA: Patient did not follow-up with any pulmonologist.  She was recently prescribed Trelegy Ellipta  inhaler as well as albuterol  rescue inhaler.  Pulmonology consulted.  Continue with inhalational bronchodilator therapy as well as empiric antibiotics for possibility of superimposed infection.   CKD stage IIIA: f/u BMP closely. F/u Vitamin D  25-OH and PTH levels.   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  Pulmonology consulted.,  Patient does have history of breast cancer in the remote past.   Breast cancer status post double mastectomy, PA: Patient was on tamoxifen  after double mastectomy, no acute issues.     Advance Care Planning:   Code Status: Full Code Discussed in length with the patient and daughter (HPOA) at the bedside at the time of admission.   Consults: Cardiology, Pulmonology   DVT prophylaxis: heparin  injection 5,000 Units Start: 11/27/23 1745     Code Status: Full Code Family Communication:   Status is: Inpatient Remains inpatient appropriate because: ARF,pleural and pericardial effusions    Subjective: Patient said that she feels better in terms of her shortness of breath this morning.  Nasal oxygen cannula in place.  She was seen by cardiology as well as pulmonology.  She was  started on intravenous diuretics yesterday and has been urinating well.   Examination: Constitutional: Mild distress, nasal oxygen cannula in place,  hoarse voice Eyes: PERRL, lids and conjunctivae normal ENMT: Mucous membranes are moist. Posterior pharynx clear of any exudate or lesions.Normal dentition.  Neck: normal, supple, no masses, no thyromegaly Respiratory: Diminished breath sounds bilaterally, barrel chest Cardiovascular: Tachycardic, muffled heart sounds, No extremity edema. 2+ pedal pulses. No carotid bruits.  Abdomen: no tenderness, no masses palpated. No hepatosplenomegaly. Bowel sounds positive.  Musculoskeletal: no clubbing / cyanosis. No joint deformity upper and lower extremities. Good ROM, no contractures. Normal muscle tone.  Skin: no rashes, lesions, ulcers. No induration Neurologic: CN 2-12 grossly intact. Sensation intact, DTR normal. Strength 5/5 x all 4 extremities.  Psychiatric: Normal judgment and insight. Alert and oriented x 3. Normal mood.     Diet Orders (From admission, onward)     Start     Ordered   11/28/23 0908  Diet NPO time specified Except for: Sips with Meds  Diet effective midnight       Question:  Except for  Answer:  Sips with Meds   11/28/23 0907            Objective: Vitals:   11/27/23 2350 11/28/23 0250 11/28/23 0840 11/28/23 0851  BP: 136/84 (!) 141/92 134/80   Pulse: (!) 107 (!) 118    Resp: 16 16 18 16   Temp: 98.1 F (36.7 C) 98.5 F (36.9 C) 98.4 F (36.9 C)   TempSrc: Oral Oral Oral   SpO2: 95% 95%    Weight:      Height:       No intake or output data in the 24 hours ending 11/28/23 1001 Filed Weights   11/27/23 1418  Weight: 36.3 kg    Scheduled Meds:  arformoterol   15 mcg Nebulization BID   azithromycin   500 mg Oral Daily   budesonide  (PULMICORT ) nebulizer solution  0.25 mg Nebulization BID   furosemide   40 mg Intravenous Daily   guaiFENesin   600 mg Oral BID   heparin  injection (subcutaneous)  5,000 Units Subcutaneous Q8H   revefenacin   175 mcg Nebulization Daily   sodium chloride  flush  3 mL Intravenous Q12H   Continuous Infusions:  cefTRIAXone   (ROCEPHIN )  IV 1 g (11/27/23 1747)    Nutritional status     Body mass index is 16.16 kg/m.  Data Reviewed:   CBC: Recent Labs  Lab 11/23/23 1546 11/27/23 1430 11/28/23 0229  WBC 9.5 15.8* 12.5*  NEUTROABS 8.0* 14.3* 10.7*  HGB 11.5* 11.5* 11.5*  HCT 35.8* 36.8 35.8*  MCV 82.0 84.8 83.3  PLT 476.0* 544* 535*   Basic Metabolic Panel: Recent Labs  Lab 11/23/23 1546 11/27/23 1430 11/28/23 0229  NA 128* 133* 133*  K 4.6 5.4* 5.0  CL 97 102 104  CO2 21 22 19*  GLUCOSE 145* 119* 89  BUN 73* 78* 85*  CREATININE 1.53* 1.51* 1.61*  CALCIUM 9.8 9.9 9.3  MG  --   --  3.2*   GFR: Estimated Creatinine Clearance: 15.2 mL/min (A) (by C-G formula based on SCr of 1.61 mg/dL (H)). Liver Function Tests: Recent Labs  Lab 11/23/23 1546 11/28/23 0229  AST 81* 47*  ALT 231* 108*  ALKPHOS 692* 299*  BILITOT 0.5 0.6  PROT 8.0 7.5  ALBUMIN  3.7 3.1*   No results for input(s): LIPASE, AMYLASE in the last 168 hours. No results for input(s): AMMONIA in the last 168 hours. Coagulation Profile: No results  for input(s): INR, PROTIME in the last 168 hours. Cardiac Enzymes: No results for input(s): CKTOTAL, CKMB, CKMBINDEX, TROPONINI in the last 168 hours. BNP (last 3 results) Recent Labs    11/23/23 1546  PROBNP 45.0   HbA1C: No results for input(s): HGBA1C in the last 72 hours. CBG: No results for input(s): GLUCAP in the last 168 hours. Lipid Profile: No results for input(s): CHOL, HDL, LDLCALC, TRIG, CHOLHDL, LDLDIRECT in the last 72 hours. Thyroid  Function Tests: No results for input(s): TSH, T4TOTAL, FREET4, T3FREE, THYROIDAB in the last 72 hours. Anemia Panel: No results for input(s): VITAMINB12, FOLATE, FERRITIN, TIBC, IRON, RETICCTPCT in the last 72 hours. Sepsis Labs: Recent Labs  Lab 11/27/23 1742  PROCALCITON 0.14    No results found for this or any previous visit (from the past 240 hours).        Radiology Studies: ECHOCARDIOGRAM COMPLETE Result Date: 11/27/2023    ECHOCARDIOGRAM REPORT   Patient Name:   IA LEEB Date of Exam: 11/27/2023 Medical Rec #:  [id number]    Height:       59.0 in Accession #:    [id number]   Weight:       80.0 lb Date of Birth:  1941-03-23     BSA:          1.249 m Patient Age:    83 years     BP:           146/90 mmHg Patient Gender: F            HR:           123 bpm. Exam Location:  Inpatient Procedure: 2D Echo (Both Spectral and Color Flow Doppler were utilized during            procedure). STAT ECHO Indications:    pericardial effusion  History:        Patient has no prior history of Echocardiogram examinations.                 COPD and chronic kidney disease, Signs/Symptoms:Shortness of                 Breath; Risk Factors:Hypertension and Dyslipidemia.  Sonographer:    Tinnie Barefoot RDCS Referring Phys: [id number] GLENDIA DASEN WEAVER IMPRESSIONS  1. Left ventricular ejection fraction, by estimation, is >75%. The left ventricle has hyperdynamic function. The left ventricle has no regional wall motion abnormalities. Left ventricular diastolic function could not be evaluated.  2. Right ventricular systolic function is normal. The right ventricular size is normal.  3. Moderate size pericardial effusion, circumferentially, predominantly located anterior to right heart chambers and apex. As per the inflow variations, IVC size, no obvious evidence of tamponade physiology. Recommend limited echocardiogram in 24 hours to reevaluate size of pericardial effusion and its hemodynamic significance.  4. The mitral valve is degenerative. Trivial mitral valve regurgitation. No evidence of mitral stenosis.  5. The aortic valve is tricuspid. Aortic valve regurgitation is not visualized. Aortic valve sclerosis is present, with no evidence of aortic valve stenosis.  6. The inferior vena cava is normal in size with <50% respiratory variability, suggesting right atrial pressure of 8 mmHg.  Comparison(s): No prior Echocardiogram. Conclusion(s)/Recommendation(s): Repeat Limited echo in 24hr to evaluate the pericardial effusion and its hemodynamic significance. FINDINGS  Left Ventricle: Left ventricular ejection fraction, by estimation, is >75%. The left ventricle has hyperdynamic function. The left ventricle has no regional wall motion abnormalities. The left ventricular internal cavity size was small. There  is no left  ventricular hypertrophy. Left ventricular diastolic function could not be evaluated due to nondiagnostic images. Left ventricular diastolic function could not be evaluated. Right Ventricle: The right ventricular size is normal. No increase in right ventricular wall thickness. Right ventricular systolic function is normal. Left Atrium: Left atrial size was normal in size. Right Atrium: Right atrial size was normal in size. Pericardium: Moderate size pericardial effusion, circumferentially, predominantly located anterior to right heart chambers and apex. As per the inflow variations, IVC size, no obvious evidence of tamponade physiology. Recommend limited echocardiogram in 24 hours to reevaluate size of pericardial effusion and its hemodynamic significance. Mitral Valve: The mitral valve is degenerative in appearance. Mild mitral annular calcification. Trivial mitral valve regurgitation. No evidence of mitral valve stenosis. Tricuspid Valve: The tricuspid valve is grossly normal. Tricuspid valve regurgitation is not demonstrated. No evidence of tricuspid stenosis. Aortic Valve: The aortic valve is tricuspid. Aortic valve regurgitation is not visualized. Aortic valve sclerosis is present, with no evidence of aortic valve stenosis. Pulmonic Valve: The pulmonic valve was grossly normal. Pulmonic valve regurgitation is not visualized. Aorta: The ascending aorta was not well visualized. Venous: The inferior vena cava is normal in size with less than 50% respiratory variability, suggesting right  atrial pressure of 8 mmHg. IAS/Shunts: The interatrial septum was not well visualized.  LEFT VENTRICLE PLAX 2D LVIDd:         3.50 cm LVIDs:         2.00 cm LV PW:         1.00 cm LV IVS:        0.80 cm LVOT diam:     1.90 cm LV SV:         27 LV SV Index:   21 LVOT Area:     2.84 cm  RIGHT VENTRICLE             IVC RV Basal diam:  2.30 cm     IVC diam: 1.50 cm RV S prime:     22.80 cm/s TAPSE (M-mode): 2.4 cm LEFT ATRIUM           Index        RIGHT ATRIUM          Index LA diam:      2.40 cm 1.92 cm/m   RA Area:     9.22 cm LA Vol (A4C): 15.8 ml 12.65 ml/m  RA Volume:   19.00 ml 15.21 ml/m  AORTIC VALVE LVOT Vmax:   73.70 cm/s LVOT Vmean:  48.000 cm/s LVOT VTI:    0.095 m  AORTA Ao Asc diam: 2.80 cm MV E velocity: 60.90 cm/s MV A velocity: 122.00 cm/s  SHUNTS MV E/A ratio:  0.50         Systemic VTI:  0.09 m                             Systemic Diam: 1.90 cm Sunit Tolia Electronically signed by Madonna Large Signature Date/Time: 11/27/2023/6:31:40 PM    Final    DG Chest Port 1 View Result Date: 11/27/2023 CLINICAL DATA:  Shortness of breath. EXAM: PORTABLE CHEST 1 VIEW COMPARISON:  11/23/2023, 11/26/2023. FINDINGS: The heart size and mediastinal contours are stable. Atherosclerotic calcification of the aorta is noted. Widening of the mediastinum is unchanged. Emphysematous changes are present in the lungs. There is a small pleural effusion on the right a moderate pleural effusion on the left  with atelectasis at the lung bases. No pneumothorax is seen. No acute osseous abnormality. Surgical clips are present in the left axilla. IMPRESSION: 1. Moderate pleural effusion on the right and small pleural effusion on the left with atelectasis at the lung bases. 2. Emphysema. Electronically Signed   By: Leita Birmingham M.D.   On: 11/27/2023 15:15   CT Angio Chest W/Cm &/Or Wo Cm Result Date: 11/26/2023 CLINICAL DATA:  Concern for pulmonary embolism. EXAM: CT ANGIOGRAPHY CHEST WITH CONTRAST TECHNIQUE: Multidetector CT  imaging of the chest was performed using the standard protocol during bolus administration of intravenous contrast. Multiplanar CT image reconstructions and MIPs were obtained to evaluate the vascular anatomy. RADIATION DOSE REDUCTION: This exam was performed according to the departmental dose-optimization program which includes automated exposure control, adjustment of the mA and/or kV according to patient size and/or use of iterative reconstruction technique. CONTRAST:  60mL OMNIPAQUE  IOHEXOL  300 MG/ML  SOLN COMPARISON:  Chest radiograph dated 11/23/2023. FINDINGS: Evaluation is limited due to cachexia and severe streak artifact caused by contrast as well as respiratory motion. Cardiovascular: There is no cardiomegaly. Moderate pericardial effusion measuring 18 mm in thickness and new since the prior CT. Correlation with echocardiogram recommended to exclude cardiac tamponade. There is moderate atherosclerotic calcification of the thoracic aorta. The aorta is tortuous. No aneurysmal dilatation or dissection. The origins of the great vessels of the aortic arch appear patent. No pulmonary artery embolus identified. Mediastinum/Nodes: Ill-defined infiltrative soft tissue throughout the mediastinum. There is an ill-defined masslike area in the mediastinum with splaying of the central pulmonary arteries measuring 3 x 4 cm (63/5). There is lobulated thickening of the soft tissues in the pre-vascular space as well as right suprahilar mediastinum. The esophagus is poorly visualized. Lungs/Pleura: Moderate bilateral pleural effusions with partial compressive atelectasis of the lower lobes. There is background of emphysema. Streaky and nodular density extending from the right hilum to the right apex. A nodular component measures 1.7 x 0.5 cm and new since the prior CT. There is no pneumothorax. Secretions noted in the right mainstem bronchus. The central airways are patent. Upper Abdomen: No acute abnormality.  Musculoskeletal: Osteopenia with degenerative changes of the spine. Cachexia. No acute osseous pathology. Review of the MIP images confirms the above findings. IMPRESSION: 1. No CT evidence of pulmonary artery embolus. 2. Moderate bilateral pleural effusions with partial compressive atelectasis of the lower lobes. 3. Moderate pericardial effusion, new since the prior CT. Correlation with echocardiogram recommended to exclude cardiac tamponade. 4. Ill-defined infiltrative soft tissue throughout the mediastinum with an ill-defined masslike area in the mediastinum with splaying of the central pulmonary arteries. Findings are concerning for neoplastic process. 5. Streaky and nodular density extending from the right hilum to the right apex, new since the prior CT. Multidisciplinary consult is advised. 6. Aortic Atherosclerosis (ICD10-I70.0) and Emphysema (ICD10-J43.9). Electronically Signed   By: Vanetta Chou M.D.   On: 11/26/2023 15:35           LOS: 1 day   Time spent= 42 mins    Deliliah Room, MD [facility]  If 7PM-7AM, please contact night-coverage  11/28/2023, 10:01 AM

## 2023-11-28 NOTE — Progress Notes (Signed)
 Progress Note  Patient Name: Tammy Wolfe Date of Encounter: 11/28/2023  Primary Cardiologist:   None   Subjective   Denies chest pain or SOB.    Inpatient Medications    Scheduled Meds:  arformoterol   15 mcg Nebulization BID   azithromycin   500 mg Oral Daily   budesonide  (PULMICORT ) nebulizer solution  0.25 mg Nebulization BID   furosemide   40 mg Intravenous Daily   guaiFENesin   600 mg Oral BID   heparin  injection (subcutaneous)  5,000 Units Subcutaneous Q8H   revefenacin   175 mcg Nebulization Daily   sodium chloride  flush  3 mL Intravenous Q12H   Continuous Infusions:  cefTRIAXone  (ROCEPHIN )  IV 1 g (11/27/23 1747)   PRN Meds: acetaminophen  **OR** acetaminophen , hydrALAZINE , ipratropium-albuterol , methocarbamol  (ROBAXIN ) injection, ondansetron  **OR** ondansetron  (ZOFRAN ) IV, oxyCODONE , polyethylene glycol   Vital Signs    Vitals:   11/27/23 2350 11/28/23 0250 11/28/23 0840 11/28/23 0851  BP: 136/84 (!) 141/92 134/80   Pulse: (!) 107 (!) 118    Resp: 16 16 18 16   Temp: 98.1 F (36.7 C) 98.5 F (36.9 C) 98.4 F (36.9 C)   TempSrc: Oral Oral Oral   SpO2: 95% 95%    Weight:      Height:       No intake or output data in the 24 hours ending 11/28/23 1112 Filed Weights   11/27/23 1418  Weight: 36.3 kg    Telemetry    Sinus tachycardia - Personally Reviewed  ECG    NA - Personally Reviewed  Physical Exam   GEN: No acute distress.  Frail appearing Neck: No  JVD Cardiac: RRR, no murmurs, rubs, or gallops.  Respiratory: Clear  to auscultation bilaterally. GI: Soft, nontender, non-distended  MS: No  edema; No deformity. Neuro:  Nonfocal  Psych: Normal affect   Labs    Chemistry Recent Labs  Lab 11/23/23 1546 11/27/23 1430 11/28/23 0229  NA 128* 133* 133*  K 4.6 5.4* 5.0  CL 97 102 104  CO2 21 22 19*  GLUCOSE 145* 119* 89  BUN 73* 78* 85*  CREATININE 1.53* 1.51* 1.61*  CALCIUM 9.8 9.9 9.3  PROT 8.0  --  7.5  ALBUMIN  3.7  --  3.1*  AST  81*  --  47*  ALT 231*  --  108*  ALKPHOS 692*  --  299*  BILITOT 0.5  --  0.6  GFRNONAA  --  34* 32*  ANIONGAP  --  9 10     Hematology Recent Labs  Lab 11/23/23 1546 11/27/23 1430 11/28/23 0229  WBC 9.5 15.8* 12.5*  RBC 4.37 4.34 4.30  HGB 11.5* 11.5* 11.5*  HCT 35.8* 36.8 35.8*  MCV 82.0 84.8 83.3  MCH  --  26.5 26.7  MCHC 32.0 31.3 32.1  RDW 16.1* 16.1* 16.1*  PLT 476.0* 544* 535*    Cardiac EnzymesNo results for input(s): TROPONINI in the last 168 hours. No results for input(s): TROPIPOC in the last 168 hours.   BNP Recent Labs  Lab 11/23/23 1546 11/27/23 1430  BNP  --  60.1  PROBNP 45.0  --      DDimer  Recent Labs  Lab 11/23/23 1546  DDIMER 1.15*     Radiology    ECHOCARDIOGRAM COMPLETE Result Date: 11/27/2023    ECHOCARDIOGRAM REPORT   Patient Name:   Tammy Wolfe Date of Exam: 11/27/2023 Medical Rec #:  [id number]    Height:       59.0 in Accession #:  [id number]   Weight:       80.0 lb Date of Birth:  1940-08-14     BSA:          1.249 m Patient Age:    83 years     BP:           146/90 mmHg Patient Gender: F            HR:           123 bpm. Exam Location:  Inpatient Procedure: 2D Echo (Both Spectral and Color Flow Doppler were utilized during            procedure). STAT ECHO Indications:    pericardial effusion  History:        Patient has no prior history of Echocardiogram examinations.                 COPD and chronic kidney disease, Signs/Symptoms:Shortness of                 Breath; Risk Factors:Hypertension and Dyslipidemia.  Sonographer:    Tinnie Barefoot RDCS Referring Phys: [id number] GLENDIA DASEN WEAVER IMPRESSIONS  1. Left ventricular ejection fraction, by estimation, is >75%. The left ventricle has hyperdynamic function. The left ventricle has no regional wall motion abnormalities. Left ventricular diastolic function could not be evaluated.  2. Right ventricular systolic function is normal. The right ventricular size is normal.  3. Moderate size  pericardial effusion, circumferentially, predominantly located anterior to right heart chambers and apex. As per the inflow variations, IVC size, no obvious evidence of tamponade physiology. Recommend limited echocardiogram in 24 hours to reevaluate size of pericardial effusion and its hemodynamic significance.  4. The mitral valve is degenerative. Trivial mitral valve regurgitation. No evidence of mitral stenosis.  5. The aortic valve is tricuspid. Aortic valve regurgitation is not visualized. Aortic valve sclerosis is present, with no evidence of aortic valve stenosis.  6. The inferior vena cava is normal in size with <50% respiratory variability, suggesting right atrial pressure of 8 mmHg. Comparison(s): No prior Echocardiogram. Conclusion(s)/Recommendation(s): Repeat Limited echo in 24hr to evaluate the pericardial effusion and its hemodynamic significance. FINDINGS  Left Ventricle: Left ventricular ejection fraction, by estimation, is >75%. The left ventricle has hyperdynamic function. The left ventricle has no regional wall motion abnormalities. The left ventricular internal cavity size was small. There is no left  ventricular hypertrophy. Left ventricular diastolic function could not be evaluated due to nondiagnostic images. Left ventricular diastolic function could not be evaluated. Right Ventricle: The right ventricular size is normal. No increase in right ventricular wall thickness. Right ventricular systolic function is normal. Left Atrium: Left atrial size was normal in size. Right Atrium: Right atrial size was normal in size. Pericardium: Moderate size pericardial effusion, circumferentially, predominantly located anterior to right heart chambers and apex. As per the inflow variations, IVC size, no obvious evidence of tamponade physiology. Recommend limited echocardiogram in 24 hours to reevaluate size of pericardial effusion and its hemodynamic significance. Mitral Valve: The mitral valve is  degenerative in appearance. Mild mitral annular calcification. Trivial mitral valve regurgitation. No evidence of mitral valve stenosis. Tricuspid Valve: The tricuspid valve is grossly normal. Tricuspid valve regurgitation is not demonstrated. No evidence of tricuspid stenosis. Aortic Valve: The aortic valve is tricuspid. Aortic valve regurgitation is not visualized. Aortic valve sclerosis is present, with no evidence of aortic valve stenosis. Pulmonic Valve: The pulmonic valve was grossly normal. Pulmonic valve regurgitation is not visualized. Aorta: The ascending  aorta was not well visualized. Venous: The inferior vena cava is normal in size with less than 50% respiratory variability, suggesting right atrial pressure of 8 mmHg. IAS/Shunts: The interatrial septum was not well visualized.  LEFT VENTRICLE PLAX 2D LVIDd:         3.50 cm LVIDs:         2.00 cm LV PW:         1.00 cm LV IVS:        0.80 cm LVOT diam:     1.90 cm LV SV:         27 LV SV Index:   21 LVOT Area:     2.84 cm  RIGHT VENTRICLE             IVC RV Basal diam:  2.30 cm     IVC diam: 1.50 cm RV S prime:     22.80 cm/s TAPSE (M-mode): 2.4 cm LEFT ATRIUM           Index        RIGHT ATRIUM          Index LA diam:      2.40 cm 1.92 cm/m   RA Area:     9.22 cm LA Vol (A4C): 15.8 ml 12.65 ml/m  RA Volume:   19.00 ml 15.21 ml/m  AORTIC VALVE LVOT Vmax:   73.70 cm/s LVOT Vmean:  48.000 cm/s LVOT VTI:    0.095 m  AORTA Ao Asc diam: 2.80 cm MV E velocity: 60.90 cm/s MV A velocity: 122.00 cm/s  SHUNTS MV E/A ratio:  0.50         Systemic VTI:  0.09 m                             Systemic Diam: 1.90 cm Sunit Tolia Electronically signed by Madonna Large Signature Date/Time: 11/27/2023/6:31:40 PM    Final    DG Chest Port 1 View Result Date: 11/27/2023 CLINICAL DATA:  Shortness of breath. EXAM: PORTABLE CHEST 1 VIEW COMPARISON:  11/23/2023, 11/26/2023. FINDINGS: The heart size and mediastinal contours are stable. Atherosclerotic calcification of the aorta is  noted. Widening of the mediastinum is unchanged. Emphysematous changes are present in the lungs. There is a small pleural effusion on the right a moderate pleural effusion on the left with atelectasis at the lung bases. No pneumothorax is seen. No acute osseous abnormality. Surgical clips are present in the left axilla. IMPRESSION: 1. Moderate pleural effusion on the right and small pleural effusion on the left with atelectasis at the lung bases. 2. Emphysema. Electronically Signed   By: Leita Birmingham M.D.   On: 11/27/2023 15:15   CT Angio Chest W/Cm &/Or Wo Cm Result Date: 11/26/2023 CLINICAL DATA:  Concern for pulmonary embolism. EXAM: CT ANGIOGRAPHY CHEST WITH CONTRAST TECHNIQUE: Multidetector CT imaging of the chest was performed using the standard protocol during bolus administration of intravenous contrast. Multiplanar CT image reconstructions and MIPs were obtained to evaluate the vascular anatomy. RADIATION DOSE REDUCTION: This exam was performed according to the departmental dose-optimization program which includes automated exposure control, adjustment of the mA and/or kV according to patient size and/or use of iterative reconstruction technique. CONTRAST:  60mL OMNIPAQUE  IOHEXOL  300 MG/ML  SOLN COMPARISON:  Chest radiograph dated 11/23/2023. FINDINGS: Evaluation is limited due to cachexia and severe streak artifact caused by contrast as well as respiratory motion. Cardiovascular: There is no cardiomegaly. Moderate pericardial effusion measuring  18 mm in thickness and new since the prior CT. Correlation with echocardiogram recommended to exclude cardiac tamponade. There is moderate atherosclerotic calcification of the thoracic aorta. The aorta is tortuous. No aneurysmal dilatation or dissection. The origins of the great vessels of the aortic arch appear patent. No pulmonary artery embolus identified. Mediastinum/Nodes: Ill-defined infiltrative soft tissue throughout the mediastinum. There is an  ill-defined masslike area in the mediastinum with splaying of the central pulmonary arteries measuring 3 x 4 cm (63/5). There is lobulated thickening of the soft tissues in the pre-vascular space as well as right suprahilar mediastinum. The esophagus is poorly visualized. Lungs/Pleura: Moderate bilateral pleural effusions with partial compressive atelectasis of the lower lobes. There is background of emphysema. Streaky and nodular density extending from the right hilum to the right apex. A nodular component measures 1.7 x 0.5 cm and new since the prior CT. There is no pneumothorax. Secretions noted in the right mainstem bronchus. The central airways are patent. Upper Abdomen: No acute abnormality. Musculoskeletal: Osteopenia with degenerative changes of the spine. Cachexia. No acute osseous pathology. Review of the MIP images confirms the above findings. IMPRESSION: 1. No CT evidence of pulmonary artery embolus. 2. Moderate bilateral pleural effusions with partial compressive atelectasis of the lower lobes. 3. Moderate pericardial effusion, new since the prior CT. Correlation with echocardiogram recommended to exclude cardiac tamponade. 4. Ill-defined infiltrative soft tissue throughout the mediastinum with an ill-defined masslike area in the mediastinum with splaying of the central pulmonary arteries. Findings are concerning for neoplastic process. 5. Streaky and nodular density extending from the right hilum to the right apex, new since the prior CT. Multidisciplinary consult is advised. 6. Aortic Atherosclerosis (ICD10-I70.0) and Emphysema (ICD10-J43.9). Electronically Signed   By: Vanetta Chou M.D.   On: 11/26/2023 15:35    Cardiac Studies   Echo:  Pericardium: Limited echo to evaluate pericardial effusion. Moderate to  large pericardial effusion, measures 1.9cm adjacent to RV. There is RV  indentation durign early diastole but no clear RV diastolic collapse. IVC  is small with <50% respiratory   variation. Not consistent with tamponade at this time but would monitor  closely for clinical signs of tamponade and suggest repeat echo in 48  hours.  .   Patient Profile     83 y.o. female seen by primary care for worsening fever, cough and shortness of breath. She was tx for AECOPD. DDimer was elevated and CXR showed bibasilar and R apical pulmonary opacities, possible R perihilar nodule. 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 She was referred to the ED.   Assessment & Plan    Pericardial effusion:  Echo with moderate to large effusion with RV collapse but no evidence of tamponade physiology on echo.  Not hypotensive.  However, she will need to have pericardicentesis at some point.  The question will be should this be a window in conjunction with biopsy.  If that is not the plan we would plan for elective pericardiocentesis likely on Monday.    Mediastinal mass:    Images reviewed.  Discussed with primary team who will confer with pulmonary and onc.    Bilateral pleural effusions:  Pulmonary has seen.  Given IV diuresis with plans for possible thoracentesis.    For questions or updates, please contact [facility] Please consult [email or web address] for contact info under Cardiology/STEMI.   Signed, Lynwood Schilling, MD  11/28/2023, 11:12 AM

## 2023-11-29 ENCOUNTER — Inpatient Hospital Stay (HOSPITAL_COMMUNITY)

## 2023-11-29 DIAGNOSIS — I3139 Other pericardial effusion (noninflammatory): Secondary | ICD-10-CM | POA: Diagnosis not present

## 2023-11-29 LAB — LACTATE DEHYDROGENASE, PLEURAL OR PERITONEAL FLUID: LD, Fluid: 209 U/L — ABNORMAL HIGH (ref 3–23)

## 2023-11-29 LAB — PROTEIN, PLEURAL OR PERITONEAL FLUID: Total protein, fluid: 3.9 g/dL

## 2023-11-29 LAB — BODY FLUID CELL COUNT WITH DIFFERENTIAL
Eos, Fluid: 2 %
Lymphs, Fluid: 47 %
Monocyte-Macrophage-Serous Fluid: 48 % — ABNORMAL LOW (ref 50–90)
Neutrophil Count, Fluid: 3 % (ref 0–25)
Total Nucleated Cell Count, Fluid: 3573 uL — ABNORMAL HIGH (ref 0–1000)

## 2023-11-29 LAB — PARATHYROID HORMONE, INTACT (NO CA): PTH: 35 pg/mL (ref 15–65)

## 2023-11-29 MED ORDER — NYSTATIN 100000 UNIT/ML MT SUSP
5.0000 mL | Freq: Four times a day (QID) | OROMUCOSAL | Status: DC
  Administered 2023-11-29 – 2023-12-13 (×52): 500000 [IU] via ORAL
  Filled 2023-11-29 (×53): qty 5

## 2023-11-29 MED ORDER — LIDOCAINE HCL (PF) 1 % IJ SOLN
5.0000 mL | Freq: Once | INTRAMUSCULAR | Status: AC
  Administered 2023-11-29: 5 mL via INTRADERMAL

## 2023-11-29 MED ORDER — ALPRAZOLAM 0.25 MG PO TABS
0.2500 mg | ORAL_TABLET | Freq: Three times a day (TID) | ORAL | Status: DC | PRN
  Administered 2023-11-29 – 2023-12-08 (×21): 0.25 mg via ORAL
  Filled 2023-11-29 (×22): qty 1

## 2023-11-29 NOTE — Progress Notes (Signed)
 PROGRESS NOTE    Tammy Wolfe  FMW:[id number] DOB: 03/29/1941 DOA: 11/27/2023 PCP: [name] ORN, MD   Brief Narrative:  83 years old female with past medical history of breast cancer status post double mastectomy, COPD, tobacco abuse and essential hypertension, brought to the ED with shortness of breath, found to have moderate pericardial effusion as well as bilateral pleural effusions and possible mediastinal/lung malignancy.  Cardiology and pulmonology on board.   Assessment & Plan:  Principal Problem:   Pericardial effusion Active Problems:   Pleural effusion, bilateral   Recent unexplained weight loss   Hoarse voice quality   Cigarette smoker   Chronic obstructive pulmonary disease with acute exacerbation (HCC)   HX: breast cancer   83 years old female with past medical history of breast cancer status post double mastectomy, COPD, tobacco abuse and essential hypertension, brought to the ED with shortness of breath.     Acute respiratory failure with hypoxia, POA: Requiring 3 L of oxygen.  This in the setting of bilateral effusion, pericardial effusion as well as acute exacerbation of COPD and possible bacterial pneumonia/lung mediastinal malignancy.   Bilateral pleural effusions, POA: Unclear etiology at this time.  This may be infectious versus malignant.  Continue with intravenous ceftriaxone  and azithromycin  for possibility of bacterial pneumonia leading to pleural effusions but since this is bilateral pleural effusions, malignancy has to be ruled out.  Pulmonology on board, patient assistance. Continue with the nasal oxygen cannula. Case discussed with Dr Annella in length. S/p right thoracentesis with 400 cc fluid removal done on 7/6, post Thora CXR showed small apical Pneumothorax but repeat CXR an hr later didn't show any pneumothorax.   Pericardial effusion with concern for pericardial tamponade, POA: Cardiology has been consulted.  Follow-up echocardiogram-no evidence of  cardiac tamponade.  Continue cardiac monitoring closely.  Patient is tachycardic but not hypotensive.  She does have muffled heart sounds on cardiac auscultation.  Etiology of the pericardial effusion is unclear at this time but  malignant pericardial effusion is high on the differentials.  Rest of the management as per cardiology. TSH on 6/17 is normal. Case discussed with Dr Lavona and Dr Annella CTS is not planning on doing pericardial window and biopsy. Plan is to do pericardiocentesis early next week.   Acute exacerbation of COPD, POA: Patient did not follow-up with any pulmonologist.  She was recently prescribed Trelegy Ellipta  inhaler as well as albuterol  rescue inhaler.  Pulmonology consulted.  Continue with inhalational bronchodilator therapy as well as empiric antibiotics for possibility of superimposed infection.   CKD stage IIIA: f/u BMP closely. F/u Vitamin D  25-OH and PTH levels.   Mediastinal mass with concern for lung malignancy, POA: Pulmonology consulted.,  Patient does have history of breast cancer in the remote past.   Breast cancer status post double mastectomy, PA: Patient was on tamoxifen  after double mastectomy, no acute issues.     Advance Care Planning:   Code Status: Full Code Discussed in length with the patient and daughter (HPOA) at the bedside at the time of admission.   Consults: Cardiology, Pulmonology, CTS   DVT prophylaxis: heparin  injection 5,000 Units Start: 11/27/23 1745     Code Status: Full Code Family Communication:   Status is: Inpatient Remains inpatient appropriate because: ARF,pleural and pericardial effusions    Subjective: Shortness of breath has improved. She is aware of the plan for thoracentesis and pericardiocentesis early next week. I also discussed with her that she will be seen by oncologist tomorrow.  Examination: Constitutional: NAD, nasal oxygen cannula in place, hoarse voice Eyes: PERRL, lids and conjunctivae  normal ENMT: Mucous membranes are moist. Posterior pharynx clear of any exudate or lesions.Normal dentition.  Neck: normal, supple, no masses, no thyromegaly Respiratory: Diminished breath sounds bilaterally, barrel chest Cardiovascular: Tachycardic, muffled heart sounds, No extremity edema. 2+ pedal pulses. No carotid bruits.  Abdomen: no tenderness, no masses palpated. No hepatosplenomegaly. Bowel sounds positive.  Musculoskeletal: no clubbing / cyanosis. No joint deformity upper and lower extremities. Good ROM, no contractures. Normal muscle tone.  Skin: no rashes, lesions, ulcers. No induration Neurologic: CN 2-12 grossly intact. Sensation intact, DTR normal. Strength 5/5 x all 4 extremities.  Psychiatric: Normal judgment and insight. Alert and oriented x 3. Normal mood.     Diet Orders (From admission, onward)     Start     Ordered   11/28/23 1250  Diet Heart Room service appropriate? Yes; Fluid consistency: Thin  Diet effective now       Question Answer Comment  Room service appropriate? Yes   Fluid consistency: Thin      11/28/23 1250            Objective: Vitals:   11/28/23 2038 11/28/23 2336 11/29/23 0506 11/29/23 0846  BP: (!) 143/90 139/86 (!) 145/84 136/73  Pulse: (!) 128 (!) 115 71   Resp: 20 (!) 21 20 14   Temp: 98 F (36.7 C) 97.8 F (36.6 C) 97.6 F (36.4 C)   TempSrc: Oral Oral Oral   SpO2: 95% 95% 95%   Weight:      Height:        Intake/Output Summary (Last 24 hours) at 11/29/2023 1018 Last data filed at 11/29/2023 0521 Gross per 24 hour  Intake 121 ml  Output 1300 ml  Net -1179 ml   Filed Weights   11/27/23 1418  Weight: 36.3 kg    Scheduled Meds:  arformoterol   15 mcg Nebulization BID   azithromycin   500 mg Oral Daily   budesonide  (PULMICORT ) nebulizer solution  0.25 mg Nebulization BID   furosemide   40 mg Intravenous Daily   guaiFENesin   600 mg Oral BID   heparin  injection (subcutaneous)  5,000 Units Subcutaneous Q8H   revefenacin   175  mcg Nebulization Daily   sodium chloride  flush  3 mL Intravenous Q12H   Continuous Infusions:  cefTRIAXone  (ROCEPHIN )  IV 1 g (11/28/23 1755)    Nutritional status     Body mass index is 16.16 kg/m.  Data Reviewed:   CBC: Recent Labs  Lab 11/23/23 1546 11/27/23 1430 11/28/23 0229  WBC 9.5 15.8* 12.5*  NEUTROABS 8.0* 14.3* 10.7*  HGB 11.5* 11.5* 11.5*  HCT 35.8* 36.8 35.8*  MCV 82.0 84.8 83.3  PLT 476.0* 544* 535*   Basic Metabolic Panel: Recent Labs  Lab 11/23/23 1546 11/27/23 1430 11/28/23 0229  NA 128* 133* 133*  K 4.6 5.4* 5.0  CL 97 102 104  CO2 21 22 19*  GLUCOSE 145* 119* 89  BUN 73* 78* 85*  CREATININE 1.53* 1.51* 1.61*  CALCIUM 9.8 9.9 9.3  MG  --   --  3.2*   GFR: Estimated Creatinine Clearance: 15.2 mL/min (A) (by C-G formula based on SCr of 1.61 mg/dL (H)). Liver Function Tests: Recent Labs  Lab 11/23/23 1546 11/28/23 0229  AST 81* 47*  ALT 231* 108*  ALKPHOS 692* 299*  BILITOT 0.5 0.6  PROT 8.0 7.5  ALBUMIN  3.7 3.1*   No results for input(s): LIPASE, AMYLASE in the last  168 hours. No results for input(s): AMMONIA in the last 168 hours. Coagulation Profile: No results for input(s): INR, PROTIME in the last 168 hours. Cardiac Enzymes: No results for input(s): CKTOTAL, CKMB, CKMBINDEX, TROPONINI in the last 168 hours. BNP (last 3 results) Recent Labs    11/23/23 1546  PROBNP 45.0   HbA1C: No results for input(s): HGBA1C in the last 72 hours. CBG: No results for input(s): GLUCAP in the last 168 hours. Lipid Profile: No results for input(s): CHOL, HDL, LDLCALC, TRIG, CHOLHDL, LDLDIRECT in the last 72 hours. Thyroid  Function Tests: No results for input(s): TSH, T4TOTAL, FREET4, T3FREE, THYROIDAB in the last 72 hours. Anemia Panel: No results for input(s): VITAMINB12, FOLATE, FERRITIN, TIBC, IRON, RETICCTPCT in the last 72 hours. Sepsis Labs: Recent Labs  Lab 11/27/23 1742   PROCALCITON 0.14    No results found for this or any previous visit (from the past 240 hours).       Radiology Studies: DG CHEST PORT 1 VIEW Result Date: 11/28/2023 CLINICAL DATA:  83 year old female with hypoxia and cough. EXAM: PORTABLE CHEST 1 VIEW COMPARISON:  Portable chest yesterday and earlier. FINDINGS: Portable AP semi upright view at 0824 hours. Large lung volumes, centrilobular emphysema on recent CTA. Bilateral pleural effusions. Stable cardiac contour. Abnormal superior mediastinum, tumor or metastatic ex nodal infiltration on recent CTA. Calcified aortic atherosclerosis. No pneumothorax or pulmonary edema. Stable ventilation since yesterday. Negative visible bowel gas.  Stable visualized osseous structures. IMPRESSION: 1. 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 See Chest CTA details 11/26/2023. 2. Stable ventilation with bilateral pleural effusions superimposed on emphysema. Electronically Signed   By: VEAR Hurst M.D.   On: 11/28/2023 12:15   ECHOCARDIOGRAM LIMITED Result Date: 11/28/2023    ECHOCARDIOGRAM LIMITED REPORT   Patient Name:   Tammy Wolfe Date of Exam: 11/28/2023 Medical Rec #:  [id number]    Height:       59.0 in Accession #:    [id number]   Weight:       80.0 lb Date of Birth:  1941-02-07     BSA:          1.249 m Patient Age:    83 years     BP:           141/92 mmHg Patient Gender: F            HR:           112 bpm. Exam Location:  Inpatient Procedure: 2D Echo, Limited Echo, Cardiac Doppler and Color Doppler (Both            Spectral and Color Flow Doppler were utilized during procedure). Indications:    Pericardial effusion  History:        Patient has prior history of Echocardiogram examinations, most                 recent 11/27/2023. COPD; Risk Factors:Hypertension and                 Dyslipidemia.  Sonographer:    Therisa Crouch Referring Phys: [id number] SUNIT TOLIA IMPRESSIONS  1. Limited echo to evaluate pericardial effusion. Moderate to large  pericardial effusion, measures 1.9cm adjacent to RV. There is RV indentation during early diastole but no clear RV diastolic collapse. IVC is small with <50% respiratory variation. Not consistent with tamponade at this time but would monitor closely for clinical signs of tamponade and continue to monitor serial echoes FINDINGS  Left Ventricle: Pericardium: Limited echo to  evaluate pericardial effusion. Moderate to large pericardial effusion, measures 1.9cm adjacent to RV. There is RV indentation durign early diastole but no clear RV diastolic collapse. IVC is small with <50% respiratory variation. Not consistent with tamponade at this time but would monitor closely for clinical signs of tamponade and suggest repeat echo in 48 hours. IVC IVC diam: 1.20 cm Lonni Nanas MD Electronically signed by Lonni Nanas MD Signature Date/Time: 11/28/2023/11:47:53 AM    Final    ECHOCARDIOGRAM COMPLETE Result Date: 11/27/2023    ECHOCARDIOGRAM REPORT   Patient Name:   Tammy Wolfe Date of Exam: 11/27/2023 Medical Rec #:  [id number]    Height:       59.0 in Accession #:    [id number]   Weight:       80.0 lb Date of Birth:  1940-12-19     BSA:          1.249 m Patient Age:    83 years     BP:           146/90 mmHg Patient Gender: F            HR:           123 bpm. Exam Location:  Inpatient Procedure: 2D Echo (Both Spectral and Color Flow Doppler were utilized during            procedure). STAT ECHO Indications:    pericardial effusion  History:        Patient has no prior history of Echocardiogram examinations.                 COPD and chronic kidney disease, Signs/Symptoms:Shortness of                 Breath; Risk Factors:Hypertension and Dyslipidemia.  Sonographer:    Tinnie Barefoot RDCS Referring Phys: [id number] GLENDIA DASEN WEAVER IMPRESSIONS  1. Left ventricular ejection fraction, by estimation, is >75%. The left ventricle has hyperdynamic function. The left ventricle has no regional wall motion abnormalities. Left  ventricular diastolic function could not be evaluated.  2. Right ventricular systolic function is normal. The right ventricular size is normal.  3. Moderate size pericardial effusion, circumferentially, predominantly located anterior to right heart chambers and apex. As per the inflow variations, IVC size, no obvious evidence of tamponade physiology. Recommend limited echocardiogram in 24 hours to reevaluate size of pericardial effusion and its hemodynamic significance.  4. The mitral valve is degenerative. Trivial mitral valve regurgitation. No evidence of mitral stenosis.  5. The aortic valve is tricuspid. Aortic valve regurgitation is not visualized. Aortic valve sclerosis is present, with no evidence of aortic valve stenosis.  6. The inferior vena cava is normal in size with <50% respiratory variability, suggesting right atrial pressure of 8 mmHg. Comparison(s): No prior Echocardiogram. Conclusion(s)/Recommendation(s): Repeat Limited echo in 24hr to evaluate the pericardial effusion and its hemodynamic significance. FINDINGS  Left Ventricle: Left ventricular ejection fraction, by estimation, is >75%. The left ventricle has hyperdynamic function. The left ventricle has no regional wall motion abnormalities. The left ventricular internal cavity size was small. There is no left  ventricular hypertrophy. Left ventricular diastolic function could not be evaluated due to nondiagnostic images. Left ventricular diastolic function could not be evaluated. Right Ventricle: The right ventricular size is normal. No increase in right ventricular wall thickness. Right ventricular systolic function is normal. Left Atrium: Left atrial size was normal in size. Right Atrium: Right atrial size was normal in size. Pericardium: Moderate size pericardial  effusion, circumferentially, predominantly located anterior to right heart chambers and apex. As per the inflow variations, IVC size, no obvious evidence of tamponade physiology.  Recommend limited echocardiogram in 24 hours to reevaluate size of pericardial effusion and its hemodynamic significance. Mitral Valve: The mitral valve is degenerative in appearance. Mild mitral annular calcification. Trivial mitral valve regurgitation. No evidence of mitral valve stenosis. Tricuspid Valve: The tricuspid valve is grossly normal. Tricuspid valve regurgitation is not demonstrated. No evidence of tricuspid stenosis. Aortic Valve: The aortic valve is tricuspid. Aortic valve regurgitation is not visualized. Aortic valve sclerosis is present, with no evidence of aortic valve stenosis. Pulmonic Valve: The pulmonic valve was grossly normal. Pulmonic valve regurgitation is not visualized. Aorta: The ascending aorta was not well visualized. Venous: The inferior vena cava is normal in size with less than 50% respiratory variability, suggesting right atrial pressure of 8 mmHg. IAS/Shunts: The interatrial septum was not well visualized.  LEFT VENTRICLE PLAX 2D LVIDd:         3.50 cm LVIDs:         2.00 cm LV PW:         1.00 cm LV IVS:        0.80 cm LVOT diam:     1.90 cm LV SV:         27 LV SV Index:   21 LVOT Area:     2.84 cm  RIGHT VENTRICLE             IVC RV Basal diam:  2.30 cm     IVC diam: 1.50 cm RV S prime:     22.80 cm/s TAPSE (M-mode): 2.4 cm LEFT ATRIUM           Index        RIGHT ATRIUM          Index LA diam:      2.40 cm 1.92 cm/m   RA Area:     9.22 cm LA Vol (A4C): 15.8 ml 12.65 ml/m  RA Volume:   19.00 ml 15.21 ml/m  AORTIC VALVE LVOT Vmax:   73.70 cm/s LVOT Vmean:  48.000 cm/s LVOT VTI:    0.095 m  AORTA Ao Asc diam: 2.80 cm MV E velocity: 60.90 cm/s MV A velocity: 122.00 cm/s  SHUNTS MV E/A ratio:  0.50         Systemic VTI:  0.09 m                             Systemic Diam: 1.90 cm Sunit Tolia Electronically signed by Madonna Large Signature Date/Time: 11/27/2023/6:31:40 PM    Final    DG Chest Port 1 View Result Date: 11/27/2023 CLINICAL DATA:  Shortness of breath. EXAM: PORTABLE  CHEST 1 VIEW COMPARISON:  11/23/2023, 11/26/2023. FINDINGS: The heart size and mediastinal contours are stable. Atherosclerotic calcification of the aorta is noted. Widening of the mediastinum is unchanged. Emphysematous changes are present in the lungs. There is a small pleural effusion on the right a moderate pleural effusion on the left with atelectasis at the lung bases. No pneumothorax is seen. No acute osseous abnormality. Surgical clips are present in the left axilla. IMPRESSION: 1. Moderate pleural effusion on the right and small pleural effusion on the left with atelectasis at the lung bases. 2. Emphysema. Electronically Signed   By: Leita Birmingham M.D.   On: 11/27/2023 15:15        LOS: 2  days   Time spent= 42 mins    Deliliah Room, MD [facility]  If 7PM-7AM, please contact night-coverage  11/29/2023, 10:18 AM

## 2023-11-29 NOTE — Progress Notes (Signed)
 Progress Note  Patient Name: Tammy Wolfe Date of Encounter: 11/29/2023  Primary Cardiologist:   None   Subjective   S/p thoracentesis.   No chest pain or SOB.     Inpatient Medications    Scheduled Meds:  arformoterol   15 mcg Nebulization BID   azithromycin   500 mg Oral Daily   budesonide  (PULMICORT ) nebulizer solution  0.25 mg Nebulization BID   furosemide   40 mg Intravenous Daily   guaiFENesin   600 mg Oral BID   heparin  injection (subcutaneous)  5,000 Units Subcutaneous Q8H   revefenacin   175 mcg Nebulization Daily   sodium chloride  flush  3 mL Intravenous Q12H   Continuous Infusions:  cefTRIAXone  (ROCEPHIN )  IV 1 g (11/28/23 1755)   PRN Meds: acetaminophen  **OR** acetaminophen , hydrALAZINE , ipratropium-albuterol , methocarbamol  (ROBAXIN ) injection, ondansetron  **OR** ondansetron  (ZOFRAN ) IV, oxyCODONE , polyethylene glycol   Vital Signs    Vitals:   11/28/23 2038 11/28/23 2336 11/29/23 0506 11/29/23 0846  BP: (!) 143/90 139/86 (!) 145/84 136/73  Pulse: (!) 128 (!) 115 71   Resp: 20 (!) 21 20 14   Temp: 98 F (36.7 C) 97.8 F (36.6 C) 97.6 F (36.4 C) 98.4 F (36.9 C)  TempSrc: Oral Oral Oral Oral  SpO2: 95% 95% 95%   Weight:      Height:        Intake/Output Summary (Last 24 hours) at 11/29/2023 1100 Last data filed at 11/29/2023 0521 Gross per 24 hour  Intake 121 ml  Output 1300 ml  Net -1179 ml   Filed Weights   11/27/23 1418  Weight: 36.3 kg    Telemetry    ST - Personally Reviewed  ECG    NA - Personally Reviewed  Physical Exam   GEN: No  acute distress.  Chronically ill appearing Neck: No  JVD Cardiac: RRR, no murmurs, rubs, or gallops.  Respiratory:     Decreased breath sounds at both bases.  GI: Soft, nontender, non-distended, normal bowel sounds  MS:  No  edema; No deformity. Neuro:   Nonfocal  Psych: Oriented and appropriate    Labs    Chemistry Recent Labs  Lab 11/23/23 1546 11/27/23 1430 11/28/23 0229  NA 128* 133*  133*  K 4.6 5.4* 5.0  CL 97 102 104  CO2 21 22 19*  GLUCOSE 145* 119* 89  BUN 73* 78* 85*  CREATININE 1.53* 1.51* 1.61*  CALCIUM 9.8 9.9 9.3  PROT 8.0  --  7.5  ALBUMIN  3.7  --  3.1*  AST 81*  --  47*  ALT 231*  --  108*  ALKPHOS 692*  --  299*  BILITOT 0.5  --  0.6  GFRNONAA  --  34* 32*  ANIONGAP  --  9 10     Hematology Recent Labs  Lab 11/23/23 1546 11/27/23 1430 11/28/23 0229  WBC 9.5 15.8* 12.5*  RBC 4.37 4.34 4.30  HGB 11.5* 11.5* 11.5*  HCT 35.8* 36.8 35.8*  MCV 82.0 84.8 83.3  MCH  --  26.5 26.7  MCHC 32.0 31.3 32.1  RDW 16.1* 16.1* 16.1*  PLT 476.0* 544* 535*    Cardiac EnzymesNo results for input(s): TROPONINI in the last 168 hours. No results for input(s): TROPIPOC in the last 168 hours.   BNP Recent Labs  Lab 11/23/23 1546 11/27/23 1430  BNP  --  60.1  PROBNP 45.0  --      DDimer  Recent Labs  Lab 11/23/23 1546  DDIMER 1.15*  Radiology    DG CHEST PORT 1 VIEW Result Date: 11/28/2023 CLINICAL DATA:  83 year old female with hypoxia and cough. EXAM: PORTABLE CHEST 1 VIEW COMPARISON:  Portable chest yesterday and earlier. FINDINGS: Portable AP semi upright view at 0824 hours. Large lung volumes, centrilobular emphysema on recent CTA. Bilateral pleural effusions. Stable cardiac contour. Abnormal superior mediastinum, tumor or metastatic ex nodal infiltration on recent CTA. Calcified aortic atherosclerosis. No pneumothorax or pulmonary edema. Stable ventilation since yesterday. Negative visible bowel gas.  Stable visualized osseous structures. IMPRESSION: 1. 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 See Chest CTA details 11/26/2023. 2. Stable ventilation with bilateral pleural effusions superimposed on emphysema. Electronically Signed   By: VEAR Hurst M.D.   On: 11/28/2023 12:15   ECHOCARDIOGRAM LIMITED Result Date: 11/28/2023    ECHOCARDIOGRAM LIMITED REPORT   Patient Name:   Tammy Wolfe Date of Exam: 11/28/2023 Medical  Rec #:  [id number]    Height:       59.0 in Accession #:    [id number]   Weight:       80.0 lb Date of Birth:  09/06/1940     BSA:          1.249 m Patient Age:    83 years     BP:           141/92 mmHg Patient Gender: F            HR:           112 bpm. Exam Location:  Inpatient Procedure: 2D Echo, Limited Echo, Cardiac Doppler and Color Doppler (Both            Spectral and Color Flow Doppler were utilized during procedure). Indications:    Pericardial effusion  History:        Patient has prior history of Echocardiogram examinations, most                 recent 11/27/2023. COPD; Risk Factors:Hypertension and                 Dyslipidemia.  Sonographer:    Therisa Crouch Referring Phys: [id number] SUNIT TOLIA IMPRESSIONS  1. Limited echo to evaluate pericardial effusion. Moderate to large pericardial effusion, measures 1.9cm adjacent to RV. There is RV indentation during early diastole but no clear RV diastolic collapse. IVC is small with <50% respiratory variation. Not consistent with tamponade at this time but would monitor closely for clinical signs of tamponade and continue to monitor serial echoes FINDINGS  Left Ventricle: Pericardium: Limited echo to evaluate pericardial effusion. Moderate to large pericardial effusion, measures 1.9cm adjacent to RV. There is RV indentation durign early diastole but no clear RV diastolic collapse. IVC is small with <50% respiratory variation. Not consistent with tamponade at this time but would monitor closely for clinical signs of tamponade and suggest repeat echo in 48 hours. IVC IVC diam: 1.20 cm Lonni Nanas MD Electronically signed by Lonni Nanas MD Signature Date/Time: 11/28/2023/11:47:53 AM    Final    ECHOCARDIOGRAM COMPLETE Result Date: 11/27/2023    ECHOCARDIOGRAM REPORT   Patient Name:   Tammy Wolfe Date of Exam: 11/27/2023 Medical Rec #:  [id number]    Height:       59.0 in Accession #:    [id number]   Weight:       80.0 lb Date of Birth:  July 15, 1940     BSA:           1.249 m Patient Age:  83 years     BP:           146/90 mmHg Patient Gender: F            HR:           123 bpm. Exam Location:  Inpatient Procedure: 2D Echo (Both Spectral and Color Flow Doppler were utilized during            procedure). STAT ECHO Indications:    pericardial effusion  History:        Patient has no prior history of Echocardiogram examinations.                 COPD and chronic kidney disease, Signs/Symptoms:Shortness of                 Breath; Risk Factors:Hypertension and Dyslipidemia.  Sonographer:    Tinnie Barefoot RDCS Referring Phys: [id number] GLENDIA DASEN WEAVER IMPRESSIONS  1. Left ventricular ejection fraction, by estimation, is >75%. The left ventricle has hyperdynamic function. The left ventricle has no regional wall motion abnormalities. Left ventricular diastolic function could not be evaluated.  2. Right ventricular systolic function is normal. The right ventricular size is normal.  3. Moderate size pericardial effusion, circumferentially, predominantly located anterior to right heart chambers and apex. As per the inflow variations, IVC size, no obvious evidence of tamponade physiology. Recommend limited echocardiogram in 24 hours to reevaluate size of pericardial effusion and its hemodynamic significance.  4. The mitral valve is degenerative. Trivial mitral valve regurgitation. No evidence of mitral stenosis.  5. The aortic valve is tricuspid. Aortic valve regurgitation is not visualized. Aortic valve sclerosis is present, with no evidence of aortic valve stenosis.  6. The inferior vena cava is normal in size with <50% respiratory variability, suggesting right atrial pressure of 8 mmHg. Comparison(s): No prior Echocardiogram. Conclusion(s)/Recommendation(s): Repeat Limited echo in 24hr to evaluate the pericardial effusion and its hemodynamic significance. FINDINGS  Left Ventricle: Left ventricular ejection fraction, by estimation, is >75%. The left ventricle has hyperdynamic  function. The left ventricle has no regional wall motion abnormalities. The left ventricular internal cavity size was small. There is no left  ventricular hypertrophy. Left ventricular diastolic function could not be evaluated due to nondiagnostic images. Left ventricular diastolic function could not be evaluated. Right Ventricle: The right ventricular size is normal. No increase in right ventricular wall thickness. Right ventricular systolic function is normal. Left Atrium: Left atrial size was normal in size. Right Atrium: Right atrial size was normal in size. Pericardium: Moderate size pericardial effusion, circumferentially, predominantly located anterior to right heart chambers and apex. As per the inflow variations, IVC size, no obvious evidence of tamponade physiology. Recommend limited echocardiogram in 24 hours to reevaluate size of pericardial effusion and its hemodynamic significance. Mitral Valve: The mitral valve is degenerative in appearance. Mild mitral annular calcification. Trivial mitral valve regurgitation. No evidence of mitral valve stenosis. Tricuspid Valve: The tricuspid valve is grossly normal. Tricuspid valve regurgitation is not demonstrated. No evidence of tricuspid stenosis. Aortic Valve: The aortic valve is tricuspid. Aortic valve regurgitation is not visualized. Aortic valve sclerosis is present, with no evidence of aortic valve stenosis. Pulmonic Valve: The pulmonic valve was grossly normal. Pulmonic valve regurgitation is not visualized. Aorta: The ascending aorta was not well visualized. Venous: The inferior vena cava is normal in size with less than 50% respiratory variability, suggesting right atrial pressure of 8 mmHg. IAS/Shunts: The interatrial septum was not well visualized.  LEFT VENTRICLE PLAX  2D LVIDd:         3.50 cm LVIDs:         2.00 cm LV PW:         1.00 cm LV IVS:        0.80 cm LVOT diam:     1.90 cm LV SV:         27 LV SV Index:   21 LVOT Area:     2.84 cm  RIGHT  VENTRICLE             IVC RV Basal diam:  2.30 cm     IVC diam: 1.50 cm RV S prime:     22.80 cm/s TAPSE (M-mode): 2.4 cm LEFT ATRIUM           Index        RIGHT ATRIUM          Index LA diam:      2.40 cm 1.92 cm/m   RA Area:     9.22 cm LA Vol (A4C): 15.8 ml 12.65 ml/m  RA Volume:   19.00 ml 15.21 ml/m  AORTIC VALVE LVOT Vmax:   73.70 cm/s LVOT Vmean:  48.000 cm/s LVOT VTI:    0.095 m  AORTA Ao Asc diam: 2.80 cm MV E velocity: 60.90 cm/s MV A velocity: 122.00 cm/s  SHUNTS MV E/A ratio:  0.50         Systemic VTI:  0.09 m                             Systemic Diam: 1.90 cm Sunit Tolia Electronically signed by Madonna Large Signature Date/Time: 11/27/2023/6:31:40 PM    Final    DG Chest Port 1 View Result Date: 11/27/2023 CLINICAL DATA:  Shortness of breath. EXAM: PORTABLE CHEST 1 VIEW COMPARISON:  11/23/2023, 11/26/2023. FINDINGS: The heart size and mediastinal contours are stable. Atherosclerotic calcification of the aorta is noted. Widening of the mediastinum is unchanged. Emphysematous changes are present in the lungs. There is a small pleural effusion on the right a moderate pleural effusion on the left with atelectasis at the lung bases. No pneumothorax is seen. No acute osseous abnormality. Surgical clips are present in the left axilla. IMPRESSION: 1. Moderate pleural effusion on the right and small pleural effusion on the left with atelectasis at the lung bases. 2. Emphysema. Electronically Signed   By: Leita Birmingham M.D.   On: 11/27/2023 15:15    Cardiac Studies   Echo:  Pericardium: Limited echo to evaluate pericardial effusion. Moderate to  large pericardial effusion, measures 1.9cm adjacent to RV. There is RV  indentation durign early diastole but no clear RV diastolic collapse. IVC  is small with <50% respiratory  variation. Not consistent with tamponade at this time but would monitor  closely for clinical signs of tamponade and suggest repeat echo in 48  hours.  .   Patient Profile      83 y.o. female seen by primary care for worsening fever, cough and shortness of breath. She was tx for AECOPD. DDimer was elevated and CXR showed bibasilar and R apical pulmonary opacities, possible R perihilar nodule. 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 She was referred to the ED.   Assessment & Plan    Pericardial effusion:    I reviewed this with TCTS and primary team.  I think  she is going to need drainage of this fluid.  This would be for therapeutic as well as diagnostic reasons.  There is no plan to take her to the OR for biopsy or window show she will need pericardiocentesis.      Mediastinal mass:    Images reviewed.  Will look for cytology on pleural fluid or pericardial fluid when available.  Plan per primary team.   Bilateral pleural effusions:  Pulmonary has seen.  Thoracentesis done with 400 cc of fluid removed.  Labs pending.   For questions or updates, please contact [facility] Please consult [email or web address] for contact info under Cardiology/STEMI.   Signed, Lynwood Schilling, MD  11/29/2023, 11:00 AM

## 2023-11-29 NOTE — Procedures (Signed)
 PROCEDURE SUMMARY:  Successful image-guided right thoracentesis. Yielded 400 mL of hazy amber fluid. Pt tolerated procedure well. No immediate complications. EBL = trace   Specimen was  sent for labs. CXR ordered.  Please see imaging section of Epic for full dictation.  Shyane Fossum H Bracha Frankowski PA-C 11/29/2023 10:35 AM

## 2023-11-29 NOTE — Progress Notes (Signed)
 Patient is s/p right thora today, post thora CXR showed small apical PTX at 11 am. Patient was asymptomatic. MD/RN notified.   CXR was repeated at 12 pm, showed no PTX. MD/RN notified.   Please call IR for questions and concerns.    Fabiha Rougeau H Ilaria Much PA-C 11/29/2023 12:30 PM

## 2023-11-30 ENCOUNTER — Inpatient Hospital Stay (HOSPITAL_COMMUNITY)

## 2023-11-30 ENCOUNTER — Encounter (HOSPITAL_COMMUNITY)
Admission: EM | Disposition: A | Discharge status: Hospice | Payer: Self-pay | Source: Home / Self Care | Attending: Internal Medicine

## 2023-11-30 DIAGNOSIS — N1832 Chronic kidney disease, stage 3b: Secondary | ICD-10-CM | POA: Diagnosis not present

## 2023-11-30 DIAGNOSIS — I3139 Other pericardial effusion (noninflammatory): Secondary | ICD-10-CM | POA: Diagnosis not present

## 2023-11-30 DIAGNOSIS — D631 Anemia in chronic kidney disease: Secondary | ICD-10-CM

## 2023-11-30 DIAGNOSIS — J90 Pleural effusion, not elsewhere classified: Secondary | ICD-10-CM | POA: Diagnosis not present

## 2023-11-30 DIAGNOSIS — J9601 Acute respiratory failure with hypoxia: Secondary | ICD-10-CM | POA: Diagnosis not present

## 2023-11-30 DIAGNOSIS — J432 Centrilobular emphysema: Secondary | ICD-10-CM | POA: Diagnosis not present

## 2023-11-30 DIAGNOSIS — J9859 Other diseases of mediastinum, not elsewhere classified: Secondary | ICD-10-CM

## 2023-11-30 LAB — CBC WITH DIFFERENTIAL/PLATELET
Abs Immature Granulocytes: 0.05 K/uL (ref 0.00–0.07)
Basophils Absolute: 0 K/uL (ref 0.0–0.1)
Basophils Relative: 0 %
Eosinophils Absolute: 0 K/uL (ref 0.0–0.5)
Eosinophils Relative: 0 %
HCT: 34.4 % — ABNORMAL LOW (ref 36.0–46.0)
Hemoglobin: 10.7 g/dL — ABNORMAL LOW (ref 12.0–15.0)
Immature Granulocytes: 1 %
Lymphocytes Relative: 7 %
Lymphs Abs: 0.6 K/uL — ABNORMAL LOW (ref 0.7–4.0)
MCH: 26 pg (ref 26.0–34.0)
MCHC: 31.1 g/dL (ref 30.0–36.0)
MCV: 83.5 fL (ref 80.0–100.0)
Monocytes Absolute: 0.9 K/uL (ref 0.1–1.0)
Monocytes Relative: 9 %
Neutro Abs: 8.1 K/uL — ABNORMAL HIGH (ref 1.7–7.7)
Neutrophils Relative %: 83 %
Platelets: 482 K/uL — ABNORMAL HIGH (ref 150–400)
RBC: 4.12 MIL/uL (ref 3.87–5.11)
RDW: 15.7 % — ABNORMAL HIGH (ref 11.5–15.5)
WBC: 9.7 K/uL (ref 4.0–10.5)
nRBC: 0 % (ref 0.0–0.2)

## 2023-11-30 LAB — BASIC METABOLIC PANEL WITH GFR
Anion gap: 11 (ref 5–15)
BUN: 85 mg/dL — ABNORMAL HIGH (ref 8–23)
CO2: 21 mmol/L — ABNORMAL LOW (ref 22–32)
Calcium: 8.9 mg/dL (ref 8.9–10.3)
Chloride: 102 mmol/L (ref 98–111)
Creatinine, Ser: 1.45 mg/dL — ABNORMAL HIGH (ref 0.44–1.00)
GFR, Estimated: 36 mL/min — ABNORMAL LOW (ref >60)
Glucose, Bld: 115 mg/dL — ABNORMAL HIGH (ref 70–99)
Potassium: 4 mmol/L (ref 3.5–5.1)
Sodium: 134 mmol/L — ABNORMAL LOW (ref 135–145)

## 2023-11-30 LAB — TSH: TSH: 0.634 u[IU]/mL (ref 0.350–4.500)

## 2023-11-30 LAB — POTASSIUM: Potassium: 4.2 mmol/L (ref 3.5–5.1)

## 2023-11-30 LAB — MAGNESIUM: Magnesium: 2.9 mg/dL — ABNORMAL HIGH (ref 1.7–2.4)

## 2023-11-30 LAB — BODY FLUID CELL COUNT WITH DIFFERENTIAL
Eos, Fluid: 0 %
Lymphs, Fluid: 81 %
Monocyte-Macrophage-Serous Fluid: 15 % — ABNORMAL LOW (ref 50–90)
Neutrophil Count, Fluid: 4 % (ref 0–25)
Total Nucleated Cell Count, Fluid: 790 uL (ref 0–1000)

## 2023-11-30 LAB — ECHOCARDIOGRAM LIMITED
Height: 59 in
Weight: 1280 [oz_av]

## 2023-11-30 LAB — GRAM STAIN

## 2023-11-30 LAB — MRSA NEXT GEN BY PCR, NASAL: MRSA by PCR Next Gen: NOT DETECTED

## 2023-11-30 MED ORDER — METOPROLOL TARTRATE 5 MG/5ML IV SOLN
5.0000 mg | Freq: Once | INTRAVENOUS | Status: AC | PRN
  Administered 2023-11-30: 5 mg via INTRAVENOUS

## 2023-11-30 MED ORDER — ENSURE PLUS HIGH PROTEIN PO LIQD
237.0000 mL | Freq: Three times a day (TID) | ORAL | Status: DC
  Administered 2023-11-30 – 2023-12-12 (×27): 237 mL via ORAL
  Filled 2023-11-30 (×2): qty 237

## 2023-11-30 MED ORDER — LIDOCAINE HCL (PF) 1 % IJ SOLN
INTRAMUSCULAR | Status: AC
  Filled 2023-11-30: qty 30

## 2023-11-30 MED ORDER — LIDOCAINE HCL (PF) 1 % IJ SOLN
INTRAMUSCULAR | Status: DC | PRN
  Administered 2023-11-30: 5 mL via INTRADERMAL

## 2023-11-30 MED ORDER — DILTIAZEM HCL 25 MG/5ML IV SOLN
10.0000 mg | Freq: Once | INTRAVENOUS | Status: DC
  Filled 2023-11-30: qty 5

## 2023-11-30 MED ORDER — OXYCODONE HCL 5 MG PO TABS
ORAL_TABLET | ORAL | Status: AC
  Filled 2023-11-30: qty 1

## 2023-11-30 MED ORDER — HEPARIN (PORCINE) IN NACL 1000-0.9 UT/500ML-% IV SOLN
INTRAVENOUS | Status: DC | PRN
  Administered 2023-11-30: 500 mL

## 2023-11-30 MED ORDER — CHLORHEXIDINE GLUCONATE CLOTH 2 % EX PADS
6.0000 | MEDICATED_PAD | Freq: Every day | CUTANEOUS | Status: DC
  Administered 2023-11-30 – 2023-12-14 (×13): 6 via TOPICAL

## 2023-11-30 MED ORDER — METOPROLOL TARTRATE 12.5 MG HALF TABLET
12.5000 mg | ORAL_TABLET | Freq: Two times a day (BID) | ORAL | Status: DC
  Administered 2023-11-30 – 2023-12-01 (×2): 12.5 mg via ORAL
  Filled 2023-11-30 (×2): qty 1

## 2023-11-30 MED ORDER — ADULT MULTIVITAMIN W/MINERALS CH
1.0000 | ORAL_TABLET | Freq: Every day | ORAL | Status: DC
  Administered 2023-11-30 – 2023-12-08 (×9): 1 via ORAL
  Filled 2023-11-30 (×10): qty 1

## 2023-11-30 MED ORDER — METOPROLOL TARTRATE 5 MG/5ML IV SOLN
5.0000 mg | Freq: Once | INTRAVENOUS | Status: DC
  Filled 2023-11-30: qty 5

## 2023-11-30 NOTE — Consult Note (Cosign Needed)
 [facility] CONSULT NOTE  Patient Care Team: [name] ORN, MD as PCP - General (Internal Medicine) Gaston Hamilton, MD as Consulting Physician (Urology) Leslee Reusing, MD as Consulting Physician (Ophthalmology) Magrinat, Sandria BROCKS, MD (Inactive) as Consulting Physician (Oncology)  CHIEF COMPLAINTS/PURPOSE OF CONSULTATION:  Mediastinal mass  REFERRING PHYSICIAN: Dr. Dino  HISTORY OF PRESENTING ILLNESS:  Tammy Wolfe 83 y.o. female who was admitted on 11/27/2023 with complaints of shortness of breath.  There was a concern for pulmonary embolism and imaging was done.  This showed no evidence of embolus however did show moderate pericardial effusion with ill-defined soft tissue mass in the mediastinum concerning for malignancy.  Therefore oncology consult has been requested. Patient is seen, assessed and examined today.  She is a very pleasant lady who is a good historian.  Reports feeling out of breath for few months even while doing her normal daily activities.  Noted it to be increasing so made an appointment with primary care physician.  She had physical exam on 11/10/2023 and was given an inhaler.  States she began to feel hoarse with inhaler and did not feel any better.  Admits to weight loss which was alarming.  It became very hard to breathe and eat at the same time.  She returned to her PCP who had ordered a CAT scan on 7 3.  States she was told to come to the ED due to the results that were noted on the CT scan. Medical history significant for breast cancer 14 years ago, hypertension, and COPD. Surgical history includes double mastectomy 14 years ago. Family history is noncontributory, states she was adopted. Social history significant for 50-year tobacco history, 1 pack/day however decreased amount smoked in recent years, quit 3 months ago.  Social alcohol use.  Denies recreational or illicit drug use.  Denies occupational hazardous or chemical exposure.    I have  reviewed her chart and materials related to her cancer extensively and collaborated history with the patient. Summary of oncologic history is as follows: Oncology History   No history exists.    ASSESSMENT & PLAN:   Mediastinal mass -Patient with several month history of progressive shortness of breath. - Imaging shows no evidence of embolus.  However shows ill-defined infiltrative soft tissue throughout mediastinum with an ill-defined mass in mediastinum concerning for neoplastic process. -Cytology is pending. - Medical oncology/Dr. Gudena will make further evaluation and treatment recommendations  Shortness of breath Moderate bilateral pleural effusions -May be due to malignancy -Thoracentesis done 11/29/2023 with 400 mL hazy amber fluid removed.  --Cytology from pleural fluid pending.  Moderate pericardial effusion -Status post pericardiocentesis done 7/7 - Cytology of pericardial fluid 7/7 pending  History of breast cancer - Status post double mastectomy in 2014. - Status post tamoxifen  x 3 years. - Patient was seen by Dr. Layla many years ago and has not followed up since completion of tamoxifen .  Anemia - Mild - Hemoglobin 10.7 - Baseline hemoglobin appears to be in the 10 to mid 11 range - Continue to monitor CBC with differential  Thrombocytosis - Likely reactive - Platelets 482K - Continue to monitor CBC with differential  Renal Insufficiency - Elevated creatinine and BUN, however stable according to baseline going back one year, which appears to be in the low to mid 1 range. - Avoid nephrotoxic agents - Monitor renal function   MEDICAL HISTORY:  Past Medical History:  Diagnosis Date   ABNORMAL THYROID  FUNCTION TESTS 12/10/2009   ALCOHOLIC HEPATITIS, HX  OF 02/14/2007   ALLERGIC RHINITIS 02/14/2007   Anemia, unspecified 04/26/2012   ANXIETY 03/19/2007   Breast cancer (HCC)    Breast implant removal status 12/23/2008   COLONIC POLYPS, HX OF 02/14/2007    COPD (chronic obstructive pulmonary disease) (HCC)    Coronary artery calcification seen on CT scan 10/31/2013   GOITER, MULTINODULAR 02/14/2007   HX, PERSONAL, ALCOHOLISM 02/14/2007   HYPERLIPIDEMIA 02/14/2007   HYPERTENSION 02/14/2007   Insomnia    OSTEOARTHRITIS 02/14/2007   OSTEOPOROSIS 02/14/2007   PANCREATITIS, HX OF 02/14/2007   Smoker 09/06/2013    SURGICAL HISTORY: Past Surgical History:  Procedure Laterality Date   BREAST SURGERY  2010   silicone prepectoral implants explanted by Dr. Marcus after rupture   BREAST SURGERY     left simple mastectomy and left axillary sentinel node biopsy, right prophylactic mastectomy   CATARACT EXTRACTION     NASAL SINUS SURGERY Left 06/14/2021   Procedure: ENDOSCOPIC MAXILLARY ANTROSTOMY WITH REMOVAL OF TISSUE;  Surgeon: Jesus Oliphant, MD;  Location: [facility] OR;  Service: ENT;  Laterality: Left;   peridontal  2010   s/p jaw and periodontal surgury  2009    SOCIAL HISTORY: Social History   Socioeconomic History   Marital status: Divorced    Spouse name: Not on file   Number of children: 3   Years of education: Not on file   Highest education level: Not on file  Occupational History   Occupation: retired Visual merchandiser  Tobacco Use   Smoking status: Former    Current packs/day: 0.00    Types: Cigarettes    Quit date: 01/24/2021    Years since quitting: 2.8   Smokeless tobacco: Never   Tobacco comments:    smoking about 4 cigs/day (as of 04/10/16)-gwd  Vaping Use   Vaping status: Never Used  Substance and Sexual Activity   Alcohol use: Not Currently    Alcohol/week: 0.0 standard drinks of alcohol    Comment: one glass/wine occasional/ no drinking now   Drug use: No   Sexual activity: Not Currently  Other Topics Concern   Not on file  Social History Narrative   Not on file   Social Drivers of Health   Financial Resource Strain: Low Risk  (04/21/2023)   Overall Financial Resource Strain (CARDIA)    Difficulty of Paying Living  Expenses: Not hard at all  Food Insecurity: No Food Insecurity (11/27/2023)   Hunger Vital Sign    Worried About Running Out of Food in the Last Year: Never true    Ran Out of Food in the Last Year: Never true  Transportation Needs: No Transportation Needs (11/27/2023)   PRAPARE - Administrator, Civil Service (Medical): No    Lack of Transportation (Non-Medical): No  Physical Activity: Inactive (04/21/2023)   Exercise Vital Sign    Days of Exercise per Week: 0 days    Minutes of Exercise per Session: 0 min  Stress: No Stress Concern Present (04/21/2023)   [employer] of Occupational Health - Occupational Stress Questionnaire    Feeling of Stress : Not at all  Social Connections: Socially Isolated (11/29/2023)   Social Connection and Isolation Panel    Frequency of Communication with Friends and Family: Three times a week    Frequency of Social Gatherings with Friends and Family: Three times a week    Attends Religious Services: Never    Active Member of Clubs or Organizations: No    Attends Banker Meetings: Never  Marital Status: Divorced  Catering manager Violence: Not At Risk (11/27/2023)   Humiliation, Afraid, Rape, and Kick questionnaire    Fear of Current or Ex-Partner: No    Emotionally Abused: No    Physically Abused: No    Sexually Abused: No    FAMILY HISTORY: Family History  Adopted: Yes  Problem Relation Age of Onset   Hyperlipidemia Brother      PHYSICAL EXAMINATION: ECOG PERFORMANCE STATUS: 3 - Symptomatic, >50% confined to bed  Vitals:   11/30/23 1500 11/30/23 1515  BP: 136/67 (!) 146/81  Pulse: (!) 104 (!) 126  Resp: (!) 27 (!) 22  Temp:    SpO2: 93% 92%   Filed Weights   11/27/23 1418  Weight: 80 lb (36.3 kg)    GENERAL: alert, + frail + thin SKIN: + Pale skin color, texture, + poor turgor + multiple bruising noted over bilateral upper extremities  EYES: normal, conjunctiva are pink and non-injected, sclera  clear OROPHARYNX: no exudate, no erythema and lips, buccal mucosa, and tongue normal  NECK: supple, thyroid  normal size, non-tender, without nodularity LYMPH: no palpable lymphadenopathy in the cervical, axillary or inguinal LUNGS: clear to auscultation and percussion with normal breathing effort HEART: regular rate & rhythm and no murmurs and no lower extremity edema ABDOMEN: abdomen soft, non-tender and normal bowel sounds MUSCULOSKELETAL: no cyanosis of digits and no clubbing  PSYCH: alert & oriented x 3 with fluent speech NEURO: no focal motor/sensory deficits   ALLERGIES:  is allergic to tetracycline, lovastatin , and pravastatin .  MEDICATIONS:  Current Facility-Administered Medications  Medication Dose Route Frequency Provider Last Rate Last Admin   acetaminophen  (TYLENOL ) tablet 650 mg  650 mg Oral Q6H PRN End, Lonni, MD   650 mg at 11/28/23 1751   Or   acetaminophen  (TYLENOL ) suppository 650 mg  650 mg Rectal Q6H PRN End, Lonni, MD       ALPRAZolam  (XANAX ) tablet 0.25 mg  0.25 mg Oral TID PRN End, Lonni, MD   0.25 mg at 11/30/23 1218   arformoterol  (BROVANA ) nebulizer solution 15 mcg  15 mcg Nebulization BID End, Christopher, MD   15 mcg at 11/30/23 0839   azithromycin  (ZITHROMAX ) tablet 500 mg  500 mg Oral Daily End, Christopher, MD   500 mg at 11/29/23 0916   budesonide  (PULMICORT ) nebulizer solution 0.25 mg  0.25 mg Nebulization BID End, Lonni, MD   0.25 mg at 11/30/23 0839   cefTRIAXone  (ROCEPHIN ) 1 g in sodium chloride  0.9 % 100 mL IVPB  1 g Intravenous Q24H End, Christopher, MD 200 mL/hr at 11/29/23 1714 1 g at 11/29/23 1714   feeding supplement (ENSURE PLUS HIGH PROTEIN) liquid 237 mL  237 mL Oral TID BM Rashid, Farhan, MD       furosemide  (LASIX ) injection 40 mg  40 mg Intravenous Daily End, Christopher, MD   40 mg at 11/29/23 0915   guaiFENesin  (MUCINEX ) 12 hr tablet 600 mg  600 mg Oral BID End, Christopher, MD   600 mg at 11/29/23 2152   heparin   injection 5,000 Units  5,000 Units Subcutaneous Q8H End, Christopher, MD   5,000 Units at 11/30/23 0609   hydrALAZINE  (APRESOLINE ) injection 10 mg  10 mg Intravenous Q8H PRN End, Christopher, MD       ipratropium-albuterol  (DUONEB) 0.5-2.5 (3) MG/3ML nebulizer solution 3 mL  3 mL Nebulization Q6H PRN End, Christopher, MD   3 mL at 11/29/23 0845   methocarbamol  (ROBAXIN ) injection 500 mg  500 mg Intravenous Q6H PRN  End, Lonni, MD       multivitamin with minerals tablet 1 tablet  1 tablet Oral Daily Rashid, Farhan, MD   1 tablet at 11/30/23 1623   nystatin  (MYCOSTATIN ) 100000 UNIT/ML suspension 500,000 Units  5 mL Oral QID End, Lonni, MD   500,000 Units at 11/29/23 2152   ondansetron  (ZOFRAN ) tablet 4 mg  4 mg Oral Q6H PRN End, Lonni, MD       Or   ondansetron  (ZOFRAN ) injection 4 mg  4 mg Intravenous Q6H PRN End, Lonni, MD       oxyCODONE  (Oxy IR/ROXICODONE ) 5 MG immediate release tablet            oxyCODONE  (Oxy IR/ROXICODONE ) immediate release tablet 5 mg  5 mg Oral Q4H PRN End, Lonni, MD   5 mg at 11/30/23 1434   polyethylene glycol (MIRALAX  / GLYCOLAX ) packet 17 g  17 g Oral Daily PRN End, Lonni, MD       revefenacin  (YUPELRI ) nebulizer solution 175 mcg  175 mcg Nebulization Daily End, Christopher, MD   175 mcg at 11/30/23 0839   sodium chloride  flush (NS) 0.9 % injection 3 mL  3 mL Intravenous Q12H End, Lonni, MD   3 mL at 11/29/23 2204     LABORATORY DATA:  I have reviewed the data as listed Lab Results  Component Value Date   WBC 9.7 11/30/2023   HGB 10.7 (L) 11/30/2023   HCT 34.4 (L) 11/30/2023   MCV 83.5 11/30/2023   PLT 482 (H) 11/30/2023   Recent Labs    11/10/23 1356 11/23/23 1546 11/27/23 1430 11/28/23 0229 11/30/23 0338  NA 134* 128* 133* 133* 134*  K 5.0 4.6 5.4* 5.0 4.0  CL 100 97 102 104 102  CO2 25 21 22  19* 21*  GLUCOSE 142* 145* 119* 89 115*  BUN 67* 73* 78* 85* 85*  CREATININE 1.52* 1.53* 1.51* 1.61* 1.45*  CALCIUM  9.6 9.8 9.9 9.3 8.9  GFRNONAA  --   --  34* 32* 36*  PROT 7.9 8.0  --  7.5  --   ALBUMIN  3.7 3.7  --  3.1*  --   AST 25 81*  --  47*  --   ALT 25 231*  --  108*  --   ALKPHOS 98 692*  --  299*  --   BILITOT 0.2 0.5  --  0.6  --   BILIDIR 0.1 0.2  --   --   --     RADIOGRAPHIC STUDIES: I have personally reviewed the radiological images as listed and agreed with the findings in the report. DG CHEST PORT 1 VIEW Result Date: 11/30/2023 CLINICAL DATA:  [id number] S/P pericardiocentesis [id number] EXAM: PORTABLE CHEST 1 VIEW COMPARISON:  11/30/2023, 5:42 a.m. FINDINGS: Bilateral lungs appear hyperlucent with coarse bronchovascular markings, in keeping with COPD. Bilateral lungs otherwise appear clear. No dense consolidation or lung collapse. Bilateral costophrenic angles are clear. Stable cardio-mediastinal silhouette. Since the prior study, there is new tubing overlying the left heart shadow, which may represent pericardial drainage catheter. Correlate with history. No pneumomediastinum noted. No acute osseous abnormalities. The soft tissues are within normal limits. IMPRESSION: No active disease. COPD. Electronically Signed   By: Ree Molt M.D.   On: 11/30/2023 15:59   ECHOCARDIOGRAM LIMITED Result Date: 11/30/2023    ECHOCARDIOGRAM LIMITED REPORT   Patient Name:   POPPI SCANTLING Date of Exam: 11/30/2023 Medical Rec #:  [id number]    Height:  59.0 in Accession #:    [id number]   Weight:       80.0 lb Date of Birth:  Sep 21, 1940     BSA:          1.249 m Patient Age:    83 years     BP:           141/71 mmHg Patient Gender: F            HR:           127 bpm. Exam Location:  Inpatient Procedure: Limited Echo (Both Spectral and Color Flow Doppler were utilized            during procedure). Indications:    I31.3 Pericardial effusion (noninflammatory)  History:        Patient has prior history of Echocardiogram examinations, most                 recent 11/28/2023. Abnormal ECG, COPD, Arrythmias:Tachycardia,                  Signs/Symptoms:Shortness of Breath and Dyspnea; Risk                 Factors:Current Smoker. Pericardial effusion. Early tamponade.                 H/O breast cancer. ETOH.  Sonographer:    Ellouise Mose RDCS Referring Phys: [phone] CHRISTOPHER END  Sonographer Comments: Image acquisition challenging due to mastectomy. Pericardiocentesis procedure. Patient with extremely thin habitus. IMPRESSIONS  1. Left ventricular ejection fraction, by estimation, is 60 to 65%. The left ventricle has normal function.  2. Moderate pericardial effusion slightly smaller than effusion seen on TTE 11/28/23 no evidence of tamponade . Moderate pericardial effusion. FINDINGS  Left Ventricle: Left ventricular ejection fraction, by estimation, is 60 to 65%. The left ventricle has normal function. The left ventricular internal cavity size was normal in size. Pericardium: Moderate pericardial effusion slightly smaller than effusion seen on TTE 11/28/23 no evidence of tamponade. A moderately sized pericardial effusion is present. Maude Emmer MD Electronically signed by Maude Emmer MD Signature Date/Time: 11/30/2023/11:17:08 AM    Final    CARDIAC CATHETERIZATION Result Date: 11/30/2023 Conclusions: Moderate-sized pericardial effusion by preprocedure bedside echocardiogram. Successful pericardiocentesis and pericardial drain placement from a subxiphoid approach, yielding 225 mL of serous (straw-colored) fluid. Recommendations: Maintain drain to negative pressure with attached negative pressure drain.  Record output every 4 hours. Perform chest radiograph to exclude pneumothorax and confirm placement. Repeat limited echocardiogram tomorrow morning to assess for reaccumulation of pericardial effusion.  If no significant reaccumulation and drain output less than 100 mL in 24 hours, drain can be removed. Lonni Hanson, MD [facility]  DG CHEST PORT 1 VIEW Result Date: 11/30/2023 CLINICAL DATA:  Pleural effusion EXAM: PORTABLE CHEST 1 VIEW  COMPARISON:  Prior chest x-ray 11/29/2023 FINDINGS: Cardiac and mediastinal contours are unchanged. Similar appearance of soft tissue density within the mediastinum. Atherosclerotic calcifications again noted in the transverse aorta. Similar appearance of biapical pleuroparenchymal scarring. The lungs appear hyperinflated with diffuse bronchitic changes and areas of lucency likely reflecting underlying emphysema. Trace left pleural effusion. No pneumothorax. IMPRESSION: 1. Trace left pleural effusion. 2. Similar appearance of the lungs which appear hyperinflated with coarse bronchitic changes and areas of lucency suggesting a combination of emphysema and chronic bronchitis. 3. Aortic atherosclerotic vascular calcifications. 4. Similar appearance of increased soft tissue density within the upper mediastinum. Electronically Signed   By: Wilkie Karalee HERO.D.  On: 11/30/2023 07:48   US  THORACENTESIS ASP PLEURAL SPACE W/IMG GUIDE Result Date: 11/29/2023 INDICATION: 83 year old female with history of COPD presented with shortness of breast. Previous imaging showed bilateral pleural effusion. Request for therapeutic and diagnostic thoracentesis. EXAM: ULTRASOUND GUIDED RIGHT THORACENTESIS MEDICATIONS: 5 mL 1% lidocaine  COMPLICATIONS: SIR Level A - No therapy, no consequence. PROCEDURE: An ultrasound guided thoracentesis was thoroughly discussed with the patient and questions answered. The benefits, risks, alternatives and complications were also discussed. The patient understands and wishes to proceed with the procedure. Written consent was obtained. Ultrasound was performed to localize and mark an adequate pocket of fluid in the right chest. The area was then prepped and draped in the normal sterile fashion. 1% Lidocaine  was used for local anesthesia. Under ultrasound guidance a 6 Fr Safe-T-Centesis catheter was introduced. Thoracentesis was performed. The catheter was removed and a dressing applied. FINDINGS: A  total of approximately 400 mL of hazy amber fluid was removed. Samples were sent to the laboratory as requested by the clinical team. Post-procedure chest x-ray showed small apical right pneumothorax. Chest x-ray was repeated in 1 hour, which showed resolution of the apical pneumothorax. IMPRESSION: Successful ultrasound guided right thoracentesis yielding 400 mL of pleural fluid. Performed by: Aimee Han, PA-C Electronically Signed   By: Cordella Banner   On: 11/29/2023 16:07   DG Chest Port 1 View Result Date: 11/29/2023 CLINICAL DATA:  Evaluate for pneumothorax. Shortness of breath and pericardial effusion. EXAM: PORTABLE CHEST 1 VIEW COMPARISON:  11/29/2023 FINDINGS: Heart size is normal. Similar appearance of widened superior mediastinum compatible with known infiltrative mass. Trace pleural effusions. Previous small right pneumothorax is not seen on the current exam. No interstitial edema or airspace disease. IMPRESSION: 1. Previous small right pneumothorax is not seen on the current exam. 2. Trace pleural effusions. 3. Similar appearance of widened superior mediastinum compatible with known infiltrative mass. Electronically Signed   By: Waddell Calk M.D.   On: 11/29/2023 12:11   DG Chest 1 View Result Date: 11/29/2023 CLINICAL DATA:  Pleural effusion status post thoracentesis EXAM: CHEST  1 VIEW COMPARISON:  11/28/2023 FINDINGS: Single frontal view of the chest demonstrates a stable cardiac silhouette. Continued ectasia and atherosclerosis of the thoracic aorta. Stable widening of the upper mediastinum consistent with infiltrative soft tissue mass seen on preceding CT. Decreased right pleural effusion after interval thoracentesis. Trace right apical pneumothorax volume estimated far less than 5%, with pleural separation measuring approximately 5 mm. Stable trace left pleural effusion. No acute bony abnormalities. IMPRESSION: 1. Near complete resolution of right pleural effusion after interval  thoracentesis. 2. Trace right apical pneumothorax, volume estimated far less than 5%. 3. Stable trace left pleural effusion. 4. Stable mediastinal widening compatible with known infiltrative soft tissue mass. Critical Value/emergent results were called by telephone at the time of interpretation on 11/29/2023 at 11:04 am to provider [facility] , who verbally acknowledged these results. Electronically Signed   By: Ozell Daring M.D.   On: 11/29/2023 11:12   DG CHEST PORT 1 VIEW Result Date: 11/28/2023 CLINICAL DATA:  83 year old female with hypoxia and cough. EXAM: PORTABLE CHEST 1 VIEW COMPARISON:  Portable chest yesterday and earlier. FINDINGS: Portable AP semi upright view at 0824 hours. Large lung volumes, centrilobular emphysema on recent CTA. Bilateral pleural effusions. Stable cardiac contour. Abnormal superior mediastinum, tumor or metastatic ex nodal infiltration on recent CTA. Calcified aortic atherosclerosis. No pneumothorax or pulmonary edema. Stable ventilation since yesterday. Negative visible bowel gas.  Stable visualized osseous  structures. IMPRESSION: 1. 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 See Chest CTA details 11/26/2023. 2. Stable ventilation with bilateral pleural effusions superimposed on emphysema. Electronically Signed   By: VEAR Hurst M.D.   On: 11/28/2023 12:15   ECHOCARDIOGRAM LIMITED Result Date: 11/28/2023    ECHOCARDIOGRAM LIMITED REPORT   Patient Name:   VANSHIKA JASTRZEBSKI Date of Exam: 11/28/2023 Medical Rec #:  [id number]    Height:       59.0 in Accession #:    [id number]   Weight:       80.0 lb Date of Birth:  07-24-1940     BSA:          1.249 m Patient Age:    83 years     BP:           141/92 mmHg Patient Gender: F            HR:           112 bpm. Exam Location:  Inpatient Procedure: 2D Echo, Limited Echo, Cardiac Doppler and Color Doppler (Both            Spectral and Color Flow Doppler were utilized during procedure). Indications:    Pericardial effusion   History:        Patient has prior history of Echocardiogram examinations, most                 recent 11/27/2023. COPD; Risk Factors:Hypertension and                 Dyslipidemia.  Sonographer:    Therisa Crouch Referring Phys: [id number] SUNIT TOLIA IMPRESSIONS  1. Limited echo to evaluate pericardial effusion. Moderate to large pericardial effusion, measures 1.9cm adjacent to RV. There is RV indentation during early diastole but no clear RV diastolic collapse. IVC is small with <50% respiratory variation. Not consistent with tamponade at this time but would monitor closely for clinical signs of tamponade and continue to monitor serial echoes FINDINGS  Left Ventricle: Pericardium: Limited echo to evaluate pericardial effusion. Moderate to large pericardial effusion, measures 1.9cm adjacent to RV. There is RV indentation durign early diastole but no clear RV diastolic collapse. IVC is small with <50% respiratory variation. Not consistent with tamponade at this time but would monitor closely for clinical signs of tamponade and suggest repeat echo in 48 hours. IVC IVC diam: 1.20 cm Lonni Nanas MD Electronically signed by Lonni Nanas MD Signature Date/Time: 11/28/2023/11:47:53 AM    Final    ECHOCARDIOGRAM COMPLETE Result Date: 11/27/2023    ECHOCARDIOGRAM REPORT   Patient Name:   AYLIN RHOADS Date of Exam: 11/27/2023 Medical Rec #:  [id number]    Height:       59.0 in Accession #:    [id number]   Weight:       80.0 lb Date of Birth:  09-02-1940     BSA:          1.249 m Patient Age:    83 years     BP:           146/90 mmHg Patient Gender: F            HR:           123 bpm. Exam Location:  Inpatient Procedure: 2D Echo (Both Spectral and Color Flow Doppler were utilized during            procedure). STAT ECHO Indications:    pericardial effusion  History:  Patient has no prior history of Echocardiogram examinations.                 COPD and chronic kidney disease, Signs/Symptoms:Shortness of                  Breath; Risk Factors:Hypertension and Dyslipidemia.  Sonographer:    Tinnie Barefoot RDCS Referring Phys: [id number] GLENDIA DASEN WEAVER IMPRESSIONS  1. Left ventricular ejection fraction, by estimation, is >75%. The left ventricle has hyperdynamic function. The left ventricle has no regional wall motion abnormalities. Left ventricular diastolic function could not be evaluated.  2. Right ventricular systolic function is normal. The right ventricular size is normal.  3. Moderate size pericardial effusion, circumferentially, predominantly located anterior to right heart chambers and apex. As per the inflow variations, IVC size, no obvious evidence of tamponade physiology. Recommend limited echocardiogram in 24 hours to reevaluate size of pericardial effusion and its hemodynamic significance.  4. The mitral valve is degenerative. Trivial mitral valve regurgitation. No evidence of mitral stenosis.  5. The aortic valve is tricuspid. Aortic valve regurgitation is not visualized. Aortic valve sclerosis is present, with no evidence of aortic valve stenosis.  6. The inferior vena cava is normal in size with <50% respiratory variability, suggesting right atrial pressure of 8 mmHg. Comparison(s): No prior Echocardiogram. Conclusion(s)/Recommendation(s): Repeat Limited echo in 24hr to evaluate the pericardial effusion and its hemodynamic significance. FINDINGS  Left Ventricle: Left ventricular ejection fraction, by estimation, is >75%. The left ventricle has hyperdynamic function. The left ventricle has no regional wall motion abnormalities. The left ventricular internal cavity size was small. There is no left  ventricular hypertrophy. Left ventricular diastolic function could not be evaluated due to nondiagnostic images. Left ventricular diastolic function could not be evaluated. Right Ventricle: The right ventricular size is normal. No increase in right ventricular wall thickness. Right ventricular systolic function is normal. Left  Atrium: Left atrial size was normal in size. Right Atrium: Right atrial size was normal in size. Pericardium: Moderate size pericardial effusion, circumferentially, predominantly located anterior to right heart chambers and apex. As per the inflow variations, IVC size, no obvious evidence of tamponade physiology. Recommend limited echocardiogram in 24 hours to reevaluate size of pericardial effusion and its hemodynamic significance. Mitral Valve: The mitral valve is degenerative in appearance. Mild mitral annular calcification. Trivial mitral valve regurgitation. No evidence of mitral valve stenosis. Tricuspid Valve: The tricuspid valve is grossly normal. Tricuspid valve regurgitation is not demonstrated. No evidence of tricuspid stenosis. Aortic Valve: The aortic valve is tricuspid. Aortic valve regurgitation is not visualized. Aortic valve sclerosis is present, with no evidence of aortic valve stenosis. Pulmonic Valve: The pulmonic valve was grossly normal. Pulmonic valve regurgitation is not visualized. Aorta: The ascending aorta was not well visualized. Venous: The inferior vena cava is normal in size with less than 50% respiratory variability, suggesting right atrial pressure of 8 mmHg. IAS/Shunts: The interatrial septum was not well visualized.  LEFT VENTRICLE PLAX 2D LVIDd:         3.50 cm LVIDs:         2.00 cm LV PW:         1.00 cm LV IVS:        0.80 cm LVOT diam:     1.90 cm LV SV:         27 LV SV Index:   21 LVOT Area:     2.84 cm  RIGHT VENTRICLE  IVC RV Basal diam:  2.30 cm     IVC diam: 1.50 cm RV S prime:     22.80 cm/s TAPSE (M-mode): 2.4 cm LEFT ATRIUM           Index        RIGHT ATRIUM          Index LA diam:      2.40 cm 1.92 cm/m   RA Area:     9.22 cm LA Vol (A4C): 15.8 ml 12.65 ml/m  RA Volume:   19.00 ml 15.21 ml/m  AORTIC VALVE LVOT Vmax:   73.70 cm/s LVOT Vmean:  48.000 cm/s LVOT VTI:    0.095 m  AORTA Ao Asc diam: 2.80 cm MV E velocity: 60.90 cm/s MV A velocity: 122.00  cm/s  SHUNTS MV E/A ratio:  0.50         Systemic VTI:  0.09 m                             Systemic Diam: 1.90 cm Sunit Tolia Electronically signed by Madonna Large Signature Date/Time: 11/27/2023/6:31:40 PM    Final    DG Chest Port 1 View Result Date: 11/27/2023 CLINICAL DATA:  Shortness of breath. EXAM: PORTABLE CHEST 1 VIEW COMPARISON:  11/23/2023, 11/26/2023. FINDINGS: The heart size and mediastinal contours are stable. Atherosclerotic calcification of the aorta is noted. Widening of the mediastinum is unchanged. Emphysematous changes are present in the lungs. There is a small pleural effusion on the right a moderate pleural effusion on the left with atelectasis at the lung bases. No pneumothorax is seen. No acute osseous abnormality. Surgical clips are present in the left axilla. IMPRESSION: 1. Moderate pleural effusion on the right and small pleural effusion on the left with atelectasis at the lung bases. 2. Emphysema. Electronically Signed   By: Leita Birmingham M.D.   On: 11/27/2023 15:15   CT Angio Chest W/Cm &/Or Wo Cm Result Date: 11/26/2023 CLINICAL DATA:  Concern for pulmonary embolism. EXAM: CT ANGIOGRAPHY CHEST WITH CONTRAST TECHNIQUE: Multidetector CT imaging of the chest was performed using the standard protocol during bolus administration of intravenous contrast. Multiplanar CT image reconstructions and MIPs were obtained to evaluate the vascular anatomy. RADIATION DOSE REDUCTION: This exam was performed according to the departmental dose-optimization program which includes automated exposure control, adjustment of the mA and/or kV according to patient size and/or use of iterative reconstruction technique. CONTRAST:  60mL OMNIPAQUE  IOHEXOL  300 MG/ML  SOLN COMPARISON:  Chest radiograph dated 11/23/2023. FINDINGS: Evaluation is limited due to cachexia and severe streak artifact caused by contrast as well as respiratory motion. Cardiovascular: There is no cardiomegaly. Moderate pericardial effusion  measuring 18 mm in thickness and new since the prior CT. Correlation with echocardiogram recommended to exclude cardiac tamponade. There is moderate atherosclerotic calcification of the thoracic aorta. The aorta is tortuous. No aneurysmal dilatation or dissection. The origins of the great vessels of the aortic arch appear patent. No pulmonary artery embolus identified. Mediastinum/Nodes: Ill-defined infiltrative soft tissue throughout the mediastinum. There is an ill-defined masslike area in the mediastinum with splaying of the central pulmonary arteries measuring 3 x 4 cm (63/5). There is lobulated thickening of the soft tissues in the pre-vascular space as well as right suprahilar mediastinum. The esophagus is poorly visualized. Lungs/Pleura: Moderate bilateral pleural effusions with partial compressive atelectasis of the lower lobes. There is background of emphysema. Streaky and nodular density extending from the right  hilum to the right apex. A nodular component measures 1.7 x 0.5 cm and new since the prior CT. There is no pneumothorax. Secretions noted in the right mainstem bronchus. The central airways are patent. Upper Abdomen: No acute abnormality. Musculoskeletal: Osteopenia with degenerative changes of the spine. Cachexia. No acute osseous pathology. Review of the MIP images confirms the above findings. IMPRESSION: 1. No CT evidence of pulmonary artery embolus. 2. Moderate bilateral pleural effusions with partial compressive atelectasis of the lower lobes. 3. Moderate pericardial effusion, new since the prior CT. Correlation with echocardiogram recommended to exclude cardiac tamponade. 4. Ill-defined infiltrative soft tissue throughout the mediastinum with an ill-defined masslike area in the mediastinum with splaying of the central pulmonary arteries. Findings are concerning for neoplastic process. 5. Streaky and nodular density extending from the right hilum to the right apex, new since the prior CT.  Multidisciplinary consult is advised. 6. Aortic Atherosclerosis (ICD10-I70.0) and Emphysema (ICD10-J43.9). Electronically Signed   By: Vanetta Chou M.D.   On: 11/26/2023 15:35   DG Chest 2 View Result Date: 11/24/2023 CLINICAL DATA:  Shortness of breath and hoarseness since November 10, 2023. EXAM: CHEST - 2 VIEW COMPARISON:  Chest x-ray December 06, 2010 FINDINGS: Slightly widened mediastinal contours and tortuous thoracic aortic contours. Calcified atherosclerosis. Hyperinflation of bilateral lungs. Bibasilar heterogeneous and linear pulmonary opacities. Additionally, right apical ill-defined opacity. Possible 6 mm right perihilar pulmonary nodule. Small bilateral pleural effusions. No acute osseous abnormality. The visualized upper abdomen is unremarkable. IMPRESSION: 1. Hyperinflated lungs with bibasilar and right apical pulmonary opacities. Additionally, possible right perihilar pulmonary nodule. Recommend chest CT with contrast for further assessment. 2. Slightly widened mediastinal contours and tortuous thoracic aortic contours. Recommend attention on chest CT for further assessment. Electronically Signed   By: Dirk Arrant M.D.   On: 11/24/2023 14:51     The total time spent in the appointment was 55 minutes encounter with patients including review of chart and various tests results, discussions about plan of care and coordination of care plan   All questions were answered. The patient knows to call the clinic with any problems, questions or concerns. No barriers to learning was detected.  Tammy Wolfe JINNY Brunner, NP 7/7/20254:37 PM   Attending Note  I personally saw the patient, reviewed the chart and examined the patient. The plan of care was discussed with the patient and the admitting team. I agree with the assessment and plan as documented above. Thank you very much for the consultation.   Metastatic cancer with pericardial effusion, pleural effusion, mediastinal soft tissue mass: I discussed  with her that the pericardial fluid came back as malignant but additional testing could not be performed because of the scant material. The next step would be to try and obtain more tissue but I discussed with her that the only reason to put her through more in ways of procedures would be if she were to be a candidate or be interested in pursuing treatments. I provided the same information to patient's daughter Tammy Wolfe. Recommendation: Patient to make a decision regarding additional biopsies versus pursuing hospice care. She is not a candidate for systemic chemotherapy and would be best served with hospice and palliative care.

## 2023-11-30 NOTE — Progress Notes (Signed)
  Echocardiogram 2D Echocardiogram has been performed.  Tammy Wolfe 11/30/2023, 10:58 AM

## 2023-11-30 NOTE — Progress Notes (Addendum)
 eLink Physician-Brief Progress Note Patient Name: Romi Rathel DOB: 1940/09/01 MRN: [id number]   Date of Service  11/30/2023  HPI/Events of Note  New onset afib/RVR 163       post periocardiocentesis with drain in place     I paged card's who returned call to [facility] and will be coming up    Camera; Discussed with RN. Stable a fib RVR, with MAP > 70, HR 140 to 160 in afib. Sats good on nasal o2.  S/p pericardiocentesis in day time for moderate effusion without tamponade, EF 55%.   EKG: A fib, qtc < 450.   K at 4, trop 13.   eICU Interventions  Stat Cardizem  10 mg while waiting for Cardiologist input. Get K/mag/TSH . Keep above over 4/2 respectively.  No full anticoagulation at this time.  VTE sq heparin       Intervention Category Intermediate Interventions: Arrhythmia - evaluation and management  Jodelle ONEIDA Hutching 11/30/2023, 10:03 PM

## 2023-11-30 NOTE — Progress Notes (Signed)
 PROGRESS NOTE    Tammy Wolfe  FMW:[id number] DOB: Mar 29, 1941 DOA: 11/27/2023 PCP: [name] ORN, MD   Brief Narrative:  83 years old female with past medical history of breast cancer status post double mastectomy, COPD, tobacco abuse and essential hypertension, brought to the ED with shortness of breath, found to have moderate pericardial effusion as well as bilateral pleural effusions and possible mediastinal/lung malignancy.  Cardiology and pulmonology on board.   Assessment & Plan:  Principal Problem:   Pericardial effusion Active Problems:   Pleural effusion, bilateral   Recent unexplained weight loss   Hoarse voice quality   Cigarette smoker   Chronic obstructive pulmonary disease with acute exacerbation (HCC)   HX: breast cancer   83 years old female with past medical history of breast cancer status post double mastectomy, COPD, tobacco abuse and essential hypertension, brought to the ED with shortness of breath.     Acute respiratory failure with hypoxia, POA: Requiring 3 L of oxygen.  This in the setting of bilateral effusion, pericardial effusion as well as acute exacerbation of COPD and possible bacterial pneumonia/lung mediastinal malignancy.   Bilateral pleural effusions, POA: Unclear etiology at this time.  This may be infectious versus malignant.  Continue with intravenous ceftriaxone  and azithromycin  for possibility of bacterial pneumonia leading to pleural effusions but since this is bilateral pleural effusions, malignancy has to be ruled out.  Pulmonology on board, patient assistance. Continue with the nasal oxygen cannula. Case discussed with Dr Annella in length. S/p right thoracentesis with 400 cc fluid removal done on 7/6, post Thora CXR showed small apical Pneumothorax but repeat CXR an hr later didn't show any pneumothorax.   Pericardial effusion with concern for pericardial tamponade, POA: Cardiology has been consulted.  Follow-up echocardiogram-no evidence of  cardiac tamponade.  Continue cardiac monitoring closely.  Patient is tachycardic but not hypotensive.  She does have muffled heart sounds on cardiac auscultation.  Etiology of the pericardial effusion is unclear at this time but  malignant pericardial effusion is high on the differentials.  Rest of the management as per cardiology. TSH on 6/17 is normal. Case discussed with Dr Lavona and Dr Annella CTS is not planning on doing pericardial window and biopsy. Plan is to do pericardiocentesis today.   Acute exacerbation of COPD, POA: Patient did not follow-up with any pulmonologist.  She was recently prescribed Trelegy Ellipta  inhaler as well as albuterol  rescue inhaler.  Pulmonology consulted.  Continue with inhalational bronchodilator therapy as well as empiric antibiotics for possibility of superimposed infection.   CKD stage IIIA: f/u BMP closely. F/u Vitamin D  25-OH and PTH levels.   Mediastinal mass with concern for lung malignancy, POA: Pulmonology consulted.,  Patient does have history of breast cancer in the remote past. Also discussed with medical oncology.   Breast cancer status post double mastectomy, PA: Patient was on tamoxifen  after double mastectomy, no acute issues.     Advance Care Planning:   Code Status: Full Code Discussed in length with the patient and daughter (HPOA) at the bedside at the time of admission.   Consults: Cardiology, Pulmonology, CTS   DVT prophylaxis: heparin  injection 5,000 Units Start: 11/27/23 1745     Code Status: Full Code Family Communication:  Daughter at the bedside Status is: Inpatient Remains inpatient appropriate because: ARF,pleural and pericardial effusions    Subjective: S/p right sided thoracentesis yesterday. She said that her shortness of breath is improved.  Nasal oxygen cannula in place.  Patient's daughter is present  at bedside.  She is going for pericardiocentesis today.  Patient will be going to be in ICU afterwards for close  observation.   Examination: Constitutional: NAD, nasal oxygen cannula in place, hoarse voice Eyes: PERRL, lids and conjunctivae normal ENMT: Mucous membranes are moist. Posterior pharynx clear of any exudate or lesions.Normal dentition.  Neck: normal, supple, no masses, no thyromegaly Respiratory: Diminished breath sounds bilaterally, barrel chest Cardiovascular: Tachycardic, muffled heart sounds, No extremity edema. 2+ pedal pulses. No carotid bruits.  Abdomen: no tenderness, no masses palpated. No hepatosplenomegaly. Bowel sounds positive.  Musculoskeletal: no clubbing / cyanosis. No joint deformity upper and lower extremities. Good ROM, no contractures. Normal muscle tone.  Skin: no rashes, lesions, ulcers. No induration Neurologic: CN 2-12 grossly intact. Sensation intact, DTR normal. Strength 5/5 x all 4 extremities.  Psychiatric: Normal judgment and insight. Alert and oriented x 3. Normal mood.     Diet Orders (From admission, onward)     Start     Ordered   11/30/23 0001  Diet NPO time specified Except for: Sips with Meds  Diet effective midnight       Question:  Except for  Answer:  Sips with Meds   11/29/23 1109            Objective: Vitals:   11/29/23 2320 11/30/23 0421 11/30/23 0841 11/30/23 0856  BP: 118/69 124/81  (!) 141/71  Pulse: (!) 113 (!) 107    Resp: 18 18  18   Temp: 97.9 F (36.6 C) 97.9 F (36.6 C)  98.7 F (37.1 C)  TempSrc: Oral Oral  Oral  SpO2: 94% 94% 98% 98%  Weight:      Height:        Intake/Output Summary (Last 24 hours) at 11/30/2023 0941 Last data filed at 11/30/2023 9095 Gross per 24 hour  Intake 240 ml  Output 850 ml  Net -610 ml   Filed Weights   11/27/23 1418  Weight: 36.3 kg    Scheduled Meds:  arformoterol   15 mcg Nebulization BID   azithromycin   500 mg Oral Daily   budesonide  (PULMICORT ) nebulizer solution  0.25 mg Nebulization BID   furosemide   40 mg Intravenous Daily   guaiFENesin   600 mg Oral BID   heparin   injection (subcutaneous)  5,000 Units Subcutaneous Q8H   nystatin   5 mL Oral QID   revefenacin   175 mcg Nebulization Daily   sodium chloride  flush  3 mL Intravenous Q12H   Continuous Infusions:  cefTRIAXone  (ROCEPHIN )  IV 1 g (11/29/23 1714)    Nutritional status     Body mass index is 16.16 kg/m.  Data Reviewed:   CBC: Recent Labs  Lab 11/23/23 1546 11/27/23 1430 11/28/23 0229 11/30/23 0338  WBC 9.5 15.8* 12.5* 9.7  NEUTROABS 8.0* 14.3* 10.7* 8.1*  HGB 11.5* 11.5* 11.5* 10.7*  HCT 35.8* 36.8 35.8* 34.4*  MCV 82.0 84.8 83.3 83.5  PLT 476.0* 544* 535* 482*   Basic Metabolic Panel: Recent Labs  Lab 11/23/23 1546 11/27/23 1430 11/28/23 0229 11/30/23 0338  NA 128* 133* 133* 134*  K 4.6 5.4* 5.0 4.0  CL 97 102 104 102  CO2 21 22 19* 21*  GLUCOSE 145* 119* 89 115*  BUN 73* 78* 85* 85*  CREATININE 1.53* 1.51* 1.61* 1.45*  CALCIUM 9.8 9.9 9.3 8.9  MG  --   --  3.2*  --    GFR: Estimated Creatinine Clearance: 16.8 mL/min (A) (by C-G formula based on SCr of 1.45 mg/dL (H)). Liver  Function Tests: Recent Labs  Lab 11/23/23 1546 11/28/23 0229  AST 81* 47*  ALT 231* 108*  ALKPHOS 692* 299*  BILITOT 0.5 0.6  PROT 8.0 7.5  ALBUMIN  3.7 3.1*   No results for input(s): LIPASE, AMYLASE in the last 168 hours. No results for input(s): AMMONIA in the last 168 hours. Coagulation Profile: No results for input(s): INR, PROTIME in the last 168 hours. Cardiac Enzymes: No results for input(s): CKTOTAL, CKMB, CKMBINDEX, TROPONINI in the last 168 hours. BNP (last 3 results) Recent Labs    11/23/23 1546  PROBNP 45.0   HbA1C: No results for input(s): HGBA1C in the last 72 hours. CBG: No results for input(s): GLUCAP in the last 168 hours. Lipid Profile: No results for input(s): CHOL, HDL, LDLCALC, TRIG, CHOLHDL, LDLDIRECT in the last 72 hours. Thyroid  Function Tests: No results for input(s): TSH, T4TOTAL, FREET4, T3FREE,  THYROIDAB in the last 72 hours. Anemia Panel: No results for input(s): VITAMINB12, FOLATE, FERRITIN, TIBC, IRON, RETICCTPCT in the last 72 hours. Sepsis Labs: Recent Labs  Lab 11/27/23 1742  PROCALCITON 0.14    No results found for this or any previous visit (from the past 240 hours).       Radiology Studies: DG CHEST PORT 1 VIEW Result Date: 11/30/2023 CLINICAL DATA:  Pleural effusion EXAM: PORTABLE CHEST 1 VIEW COMPARISON:  Prior chest x-ray 11/29/2023 FINDINGS: Cardiac and mediastinal contours are unchanged. Similar appearance of soft tissue density within the mediastinum. Atherosclerotic calcifications again noted in the transverse aorta. Similar appearance of biapical pleuroparenchymal scarring. The lungs appear hyperinflated with diffuse bronchitic changes and areas of lucency likely reflecting underlying emphysema. Trace left pleural effusion. No pneumothorax. IMPRESSION: 1. Trace left pleural effusion. 2. Similar appearance of the lungs which appear hyperinflated with coarse bronchitic changes and areas of lucency suggesting a combination of emphysema and chronic bronchitis. 3. Aortic atherosclerotic vascular calcifications. 4. Similar appearance of increased soft tissue density within the upper mediastinum. Electronically Signed   By: Wilkie Lent M.D.   On: 11/30/2023 07:48   US  THORACENTESIS ASP PLEURAL SPACE W/IMG GUIDE Result Date: 11/29/2023 INDICATION: 83 year old female with history of COPD presented with shortness of breast. Previous imaging showed bilateral pleural effusion. Request for therapeutic and diagnostic thoracentesis. EXAM: ULTRASOUND GUIDED RIGHT THORACENTESIS MEDICATIONS: 5 mL 1% lidocaine  COMPLICATIONS: SIR Level A - No therapy, no consequence. PROCEDURE: An ultrasound guided thoracentesis was thoroughly discussed with the patient and questions answered. The benefits, risks, alternatives and complications were also discussed. The patient  understands and wishes to proceed with the procedure. Written consent was obtained. Ultrasound was performed to localize and mark an adequate pocket of fluid in the right chest. The area was then prepped and draped in the normal sterile fashion. 1% Lidocaine  was used for local anesthesia. Under ultrasound guidance a 6 Fr Safe-T-Centesis catheter was introduced. Thoracentesis was performed. The catheter was removed and a dressing applied. FINDINGS: A total of approximately 400 mL of hazy amber fluid was removed. Samples were sent to the laboratory as requested by the clinical team. Post-procedure chest x-ray showed small apical right pneumothorax. Chest x-ray was repeated in 1 hour, which showed resolution of the apical pneumothorax. IMPRESSION: Successful ultrasound guided right thoracentesis yielding 400 mL of pleural fluid. Performed by: Aimee Han, PA-C Electronically Signed   By: Cordella Banner   On: 11/29/2023 16:07   DG Chest Port 1 View Result Date: 11/29/2023 CLINICAL DATA:  Evaluate for pneumothorax. Shortness of breath and pericardial effusion. EXAM: PORTABLE CHEST  1 VIEW COMPARISON:  11/29/2023 FINDINGS: Heart size is normal. Similar appearance of widened superior mediastinum compatible with known infiltrative mass. Trace pleural effusions. Previous small right pneumothorax is not seen on the current exam. No interstitial edema or airspace disease. IMPRESSION: 1. Previous small right pneumothorax is not seen on the current exam. 2. Trace pleural effusions. 3. Similar appearance of widened superior mediastinum compatible with known infiltrative mass. Electronically Signed   By: Waddell Calk M.D.   On: 11/29/2023 12:11   DG Chest 1 View Result Date: 11/29/2023 CLINICAL DATA:  Pleural effusion status post thoracentesis EXAM: CHEST  1 VIEW COMPARISON:  11/28/2023 FINDINGS: Single frontal view of the chest demonstrates a stable cardiac silhouette. Continued ectasia and atherosclerosis of the thoracic  aorta. Stable widening of the upper mediastinum consistent with infiltrative soft tissue mass seen on preceding CT. Decreased right pleural effusion after interval thoracentesis. Trace right apical pneumothorax volume estimated far less than 5%, with pleural separation measuring approximately 5 mm. Stable trace left pleural effusion. No acute bony abnormalities. IMPRESSION: 1. Near complete resolution of right pleural effusion after interval thoracentesis. 2. Trace right apical pneumothorax, volume estimated far less than 5%. 3. Stable trace left pleural effusion. 4. Stable mediastinal widening compatible with known infiltrative soft tissue mass. Critical Value/emergent results were called by telephone at the time of interpretation on 11/29/2023 at 11:04 am to provider [facility] , who verbally acknowledged these results. Electronically Signed   By: Ozell Daring M.D.   On: 11/29/2023 11:12   ECHOCARDIOGRAM LIMITED Result Date: 11/28/2023    ECHOCARDIOGRAM LIMITED REPORT   Patient Name:   Tammy Wolfe Date of Exam: 11/28/2023 Medical Rec #:  [id number]    Height:       59.0 in Accession #:    [id number]   Weight:       80.0 lb Date of Birth:  09-30-1940     BSA:          1.249 m Patient Age:    83 years     BP:           141/92 mmHg Patient Gender: F            HR:           112 bpm. Exam Location:  Inpatient Procedure: 2D Echo, Limited Echo, Cardiac Doppler and Color Doppler (Both            Spectral and Color Flow Doppler were utilized during procedure). Indications:    Pericardial effusion  History:        Patient has prior history of Echocardiogram examinations, most                 recent 11/27/2023. COPD; Risk Factors:Hypertension and                 Dyslipidemia.  Sonographer:    Therisa Crouch Referring Phys: [id number] SUNIT TOLIA IMPRESSIONS  1. Limited echo to evaluate pericardial effusion. Moderate to large pericardial effusion, measures 1.9cm adjacent to RV. There is RV indentation during early diastole but no  clear RV diastolic collapse. IVC is small with <50% respiratory variation. Not consistent with tamponade at this time but would monitor closely for clinical signs of tamponade and continue to monitor serial echoes FINDINGS  Left Ventricle: Pericardium: Limited echo to evaluate pericardial effusion. Moderate to large pericardial effusion, measures 1.9cm adjacent to RV. There is RV indentation durign early diastole but no clear RV diastolic collapse. IVC is small with <  50% respiratory variation. Not consistent with tamponade at this time but would monitor closely for clinical signs of tamponade and suggest repeat echo in 48 hours. IVC IVC diam: 1.20 cm Lonni Nanas MD Electronically signed by Lonni Nanas MD Signature Date/Time: 11/28/2023/11:47:53 AM    Final         LOS: 3 days   Time spent= 43 mins    Deliliah Room, MD [facility]  If 7PM-7AM, please contact night-coverage  11/30/2023, 9:41 AM

## 2023-11-30 NOTE — Progress Notes (Signed)
 NAME:  Tammy Wolfe, MRN:  [id number], DOB:  11/07/1940, LOS: 3 ADMISSION DATE:  11/27/2023, CONSULTATION DATE:  11/30/23 REFERRING MD:  [facility], CHIEF COMPLAINT:  DOE/SOB   History of Present Illness:  83 year old severe emphysema presents to the ED at the direction of PCP with abnormal CT findings.  Diagnosis COPD Per PCP notes.  I see no pulmonary function test to confirm or validate this diagnosis.  Recently treated with exacerbation per PCP note.  On Trelegy at baseline.  CTA PE protocol was obtained.  No PE seen but did demonstrate bilateral moderate pleural effusions as well as pericardial effusion and concern for mediastinal mass.  Severe emphysematous changes noted.  The pericardial effusion and pleural effusions prompted recommendation to go to the ED.  She is tachycardic.  Normotensive.  On room air satting high 90s per initial flowsheet data..  She was placed on nasal cannula shortly thereafter.  Sats in the high 90s.  She endorses shortness of breath.  Dyspnea exertion. Feels better with O2.  Endorses 10 pound weight loss over the last 1 month.  Relatively sudden onset of breathing decompensation over the last few weeks.  Pertinent  Medical History  Breast cancer Emphysema HTN HLD H/o ETOH abuse, ETOH hepatitis and pancreatitis  Significant Hospital Events: Including procedures, antibiotic start and stop dates in addition to other pertinent events   7/4 admission, thora on R 7/7 pericardial drain  Interim History / Subjective:  Feeling ok, no significant SOB.   Objective    Blood pressure (!) 141/77, pulse (!) 124, temperature 98.7 F (37.1 C), temperature source Oral, resp. rate (!) 29, height 4' 11 (1.499 m), weight 36.3 kg, SpO2 93%.        Intake/Output Summary (Last 24 hours) at 11/30/2023 1859 Last data filed at 11/30/2023 9095 Gross per 24 hour  Intake 240 ml  Output 850 ml  Net -610 ml   Filed Weights   11/27/23 1418  Weight: 36.3 kg    Examination: General:  chronically ill appearing, cachectic woman sitting up in bed in NAD HENT: [place]/AT, eyes anicteric Lungs: decreased basilar breath sounds, barrel chest, breathing comfortably on Daisy, no conversational dyspnea Cardiovascular: S1S2, RRR Abdomen: thin, soft Extremities: no significant edema Neuro: awake, alert, moving all extremities Na+ 134 BUN 85 Cr 1.45 WBC 9.7 H/H 10.7/34.4 Platelets 482 Pleural fluid: LD209, protein 3.9g 3573 WBC, 47% lymphs, 48% monos, 3%  Cyto pending   Resolved problem list   Assessment and Plan   Pericardial effusion Bilateral pleural effusions; exudative on the R Soft tissue mass in mediastinum, not definitely lung based on distribution of pulmonary vessels -Very high concern for an advanced stage malignancy; d/w patient and her daughters and reviewed her CT scan with all 3 of them - cytology from pleural and pericardial fluid pending -CXR in AM to monitoring for reaccumulation of pleural effusion -pericardial drain her cardiolgoy  Right upper lobe linear density/nodule: Unclear if related to mediastinal process or not. -anticipate biopsy will not be required  Acute respiratory failure with hypoxia-- likely mostly related to pleural effusions Severe emphysema: No PFTs to review. -con't triple inhaled therapy  Frailty, severe protein energy malnutrition, cachexia -ambulate, focus on nutrition -RD consult  -Ensure  CKD 3b -strict I/O -renally dose meds, avoid nephrotoxic meds  Chronic anemia, likely due to chronic disease -transfuse for Hb<7 or hemodynamically significant bleeding  2 daughters updated with patient at bedside tonight.   PCCM to assume care while admitted to ICU; will plan  to transfer back to [facility] when she transfers out of ICU.   Best Practice (right click and Reselect all SmartList Selections daily)   Per primary  Labs   CBC: Recent Labs  Lab 11/27/23 1430 11/28/23 0229 11/30/23 0338  WBC 15.8* 12.5* 9.7  NEUTROABS  14.3* 10.7* 8.1*  HGB 11.5* 11.5* 10.7*  HCT 36.8 35.8* 34.4*  MCV 84.8 83.3 83.5  PLT 544* 535* 482*    Basic Metabolic Panel: Recent Labs  Lab 11/27/23 1430 11/28/23 0229 11/30/23 0338  NA 133* 133* 134*  K 5.4* 5.0 4.0  CL 102 104 102  CO2 22 19* 21*  GLUCOSE 119* 89 115*  BUN 78* 85* 85*  CREATININE 1.51* 1.61* 1.45*  CALCIUM 9.9 9.3 8.9  MG  --  3.2*  --    GFR: Estimated Creatinine Clearance: 16.8 mL/min (A) (by C-G formula based on SCr of 1.45 mg/dL (H)). Recent Labs  Lab 11/27/23 1430 11/27/23 1742 11/28/23 0229 11/30/23 0338  PROCALCITON  --  0.14  --   --   WBC 15.8*  --  12.5* 9.7    Liver Function Tests: Recent Labs  Lab 11/28/23 0229  AST 47*  ALT 108*  ALKPHOS 299*  BILITOT 0.6  PROT 7.5  ALBUMIN  3.1*    Critical care time:     Leita SHAUNNA Gaskins, DO 11/30/23 7:15 PM [facility]  For contact information, see Amion. If no response to pager, please call PCCM consult pager. After hours, 7PM- 7AM, please call Elink.

## 2023-11-30 NOTE — Interval H&P Note (Signed)
 History and Physical Interval Note:  11/30/2023 10:25 AM  [name]  has presented today for surgery, with the diagnosis of pericardial effusion.  The various methods of treatment have been discussed with the patient and family. After consideration of risks, benefits and other options for treatment, the patient has consented to  Procedure(s): PERICARDIOCENTESIS (N/A) as a surgical intervention.  The patient's history has been reviewed, patient examined, no change in status, stable for surgery.  I have reviewed the patient's chart and labs.  Questions were answered to the patient's satisfaction.     Tammy Wolfe

## 2023-11-30 NOTE — Progress Notes (Signed)
 Initial Nutrition Assessment  DOCUMENTATION CODES:   Underweight  INTERVENTION:  -Continue diet as ordered -Add Ensure Plus High Protein TID -Add MVI   NUTRITION DIAGNOSIS:   Inadequate oral intake related to decreased appetite, catabolic illness as evidenced by NPO status, meal completion < 50%.  GOAL:   Patient will meet greater than or equal to 90% of their needs  MONITOR:   PO intake, Supplement acceptance, Labs, Weight trends, Skin, I & O's  REASON FOR ASSESSMENT:   Other (Comment) (Poor PO intake, BMI 16)    ASSESSMENT:   Pt hx PE, COPD, Cancer who was admitted r/t SOB, pericardial effusion.  Attempted to speak to pt in room, pt was just taken to cath lab at time of visit for pericardiocentesis. Pt with no noted n/v/c/d or chewing/swallowing difficulties. Continues on heart menu, regular texture, thin liquids. One consumed meal x admit at 25%, has been NPO on and off. Last BM 7/4. Pt with BMI 16, underweight, particularly for age. Will trial Ensure Plus High Protein TID to promote adequate kcal/pro intake. Will complete NFPE at f/u. 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 Per wt hx, pt was 43.5 kg at highest weight (05/06/22), now at 36.3 kg. Documented weight loss of 13 lbs x ~1 yr (14%), which is significant. Will continue to monitor, RDN available prn.   Labs H/H 10.7/34.4 ALK 299 AST 47 ALT 108 BUN 85 Creat 1.45 GFR 47 Mg 3.2 Albumin  3.1  Medications Azithromycin  Ceftriaxone  Furosemide  Heparin  Hydralazine     Intake/Output Summary (Last 24 hours) at 11/30/2023 1443 Last data filed at 11/30/2023 0904 Gross per 24 hour  Intake 240 ml  Output 850 ml  Net -610 ml    NUTRITION - FOCUSED PHYSICAL EXAM: Will complete at f/u as able  Diet Order:   Diet Order             Diet Heart Room service appropriate? Yes; Fluid consistency: Thin  Diet effective now                   EDUCATION NEEDS:   No education needs have been  identified at this time  Skin:  Skin Assessment: Reviewed RN Assessment  Last BM:  7/4  Height:   Ht Readings from Last 1 Encounters:  11/27/23 4' 11 (1.499 m)    Weight:   Wt Readings from Last 1 Encounters:  11/27/23 36.3 kg    BMI:  Body mass index is 16.16 kg/m.  Estimated Nutritional Needs:   Kcal:  1250-1450 kcal  Protein:  55-75 g  Fluid:  >/=1500 ml    Ahmeer Tuman Daml-Budig, RDN, LDN Registered Dietitian Nutritionist RD Inpatient Contact Info in [place]

## 2023-11-30 NOTE — H&P (View-Only) (Signed)
 Progress Note  Patient Name: Tammy Wolfe Date of Encounter: 11/30/2023  Primary Cardiologist:   None   Subjective   No pain.  No SOB.  Slept better last night.    Inpatient Medications    Scheduled Meds:  arformoterol   15 mcg Nebulization BID   azithromycin   500 mg Oral Daily   budesonide  (PULMICORT ) nebulizer solution  0.25 mg Nebulization BID   furosemide   40 mg Intravenous Daily   guaiFENesin   600 mg Oral BID   heparin  injection (subcutaneous)  5,000 Units Subcutaneous Q8H   nystatin   5 mL Oral QID   revefenacin   175 mcg Nebulization Daily   sodium chloride  flush  3 mL Intravenous Q12H   Continuous Infusions:  cefTRIAXone  (ROCEPHIN )  IV 1 g (11/29/23 1714)   PRN Meds: acetaminophen  **OR** acetaminophen , ALPRAZolam , hydrALAZINE , ipratropium-albuterol , methocarbamol  (ROBAXIN ) injection, ondansetron  **OR** ondansetron  (ZOFRAN ) IV, oxyCODONE , polyethylene glycol   Vital Signs    Vitals:   11/29/23 1955 11/29/23 2019 11/29/23 2320 11/30/23 0421  BP:  127/78 118/69 124/81  Pulse:  (!) 116 (!) 113 (!) 107  Resp:  16 18 18   Temp:  98 F (36.7 C) 97.9 F (36.6 C) 97.9 F (36.6 C)  TempSrc:  Oral Oral Oral  SpO2: 97% 98% 94% 94%  Weight:      Height:        Intake/Output Summary (Last 24 hours) at 11/30/2023 0823 Last data filed at 11/30/2023 9388 Gross per 24 hour  Intake 240 ml  Output 700 ml  Net -460 ml   Filed Weights   11/27/23 1418  Weight: 36.3 kg    Telemetry    ST with rare ectopy.  - Personally Reviewed  ECG    NA - Personally Reviewed  Physical Exam   GEN: No  acute distress.   Neck: No  JVD Cardiac: RRR, no murmurs, rubs, or gallops.  Respiratory: Clear   to auscultation bilaterally. GI: Soft, nontender, non-distended, normal bowel sounds  MS:  No edema; No deformity. Neuro:   Nonfocal  Psych: Oriented and appropriate     Labs    Chemistry Recent Labs  Lab 11/23/23 1546 11/27/23 1430 11/28/23 0229 11/30/23 0338  NA 128*  133* 133* 134*  K 4.6 5.4* 5.0 4.0  CL 97 102 104 102  CO2 21 22 19* 21*  GLUCOSE 145* 119* 89 115*  BUN 73* 78* 85* 85*  CREATININE 1.53* 1.51* 1.61* 1.45*  CALCIUM 9.8 9.9 9.3 8.9  PROT 8.0  --  7.5  --   ALBUMIN  3.7  --  3.1*  --   AST 81*  --  47*  --   ALT 231*  --  108*  --   ALKPHOS 692*  --  299*  --   BILITOT 0.5  --  0.6  --   GFRNONAA  --  34* 32* 36*  ANIONGAP  --  9 10 11      Hematology Recent Labs  Lab 11/27/23 1430 11/28/23 0229 11/30/23 0338  WBC 15.8* 12.5* 9.7  RBC 4.34 4.30 4.12  HGB 11.5* 11.5* 10.7*  HCT 36.8 35.8* 34.4*  MCV 84.8 83.3 83.5  MCH 26.5 26.7 26.0  MCHC 31.3 32.1 31.1  RDW 16.1* 16.1* 15.7*  PLT 544* 535* 482*    Cardiac EnzymesNo results for input(s): TROPONINI in the last 168 hours. No results for input(s): TROPIPOC in the last 168 hours.   BNP Recent Labs  Lab 11/23/23 1546 11/27/23 1430  BNP  --  60.1  PROBNP 45.0  --      DDimer  Recent Labs  Lab 11/23/23 1546  DDIMER 1.15*     Radiology    DG CHEST PORT 1 VIEW Result Date: 11/30/2023 CLINICAL DATA:  Pleural effusion EXAM: PORTABLE CHEST 1 VIEW COMPARISON:  Prior chest x-ray 11/29/2023 FINDINGS: Cardiac and mediastinal contours are unchanged. Similar appearance of soft tissue density within the mediastinum. Atherosclerotic calcifications again noted in the transverse aorta. Similar appearance of biapical pleuroparenchymal scarring. The lungs appear hyperinflated with diffuse bronchitic changes and areas of lucency likely reflecting underlying emphysema. Trace left pleural effusion. No pneumothorax. IMPRESSION: 1. Trace left pleural effusion. 2. Similar appearance of the lungs which appear hyperinflated with coarse bronchitic changes and areas of lucency suggesting a combination of emphysema and chronic bronchitis. 3. Aortic atherosclerotic vascular calcifications. 4. Similar appearance of increased soft tissue density within the upper mediastinum. Electronically Signed    By: Wilkie Lent M.D.   On: 11/30/2023 07:48   US  THORACENTESIS ASP PLEURAL SPACE W/IMG GUIDE Result Date: 11/29/2023 INDICATION: 83 year old female with history of COPD presented with shortness of breast. Previous imaging showed bilateral pleural effusion. Request for therapeutic and diagnostic thoracentesis. EXAM: ULTRASOUND GUIDED RIGHT THORACENTESIS MEDICATIONS: 5 mL 1% lidocaine  COMPLICATIONS: SIR Level A - No therapy, no consequence. PROCEDURE: An ultrasound guided thoracentesis was thoroughly discussed with the patient and questions answered. The benefits, risks, alternatives and complications were also discussed. The patient understands and wishes to proceed with the procedure. Written consent was obtained. Ultrasound was performed to localize and mark an adequate pocket of fluid in the right chest. The area was then prepped and draped in the normal sterile fashion. 1% Lidocaine  was used for local anesthesia. Under ultrasound guidance a 6 Fr Safe-T-Centesis catheter was introduced. Thoracentesis was performed. The catheter was removed and a dressing applied. FINDINGS: A total of approximately 400 mL of hazy amber fluid was removed. Samples were sent to the laboratory as requested by the clinical team. Post-procedure chest x-ray showed small apical right pneumothorax. Chest x-ray was repeated in 1 hour, which showed resolution of the apical pneumothorax. IMPRESSION: Successful ultrasound guided right thoracentesis yielding 400 mL of pleural fluid. Performed by: Aimee Han, PA-C Electronically Signed   By: Cordella Banner   On: 11/29/2023 16:07   DG Chest Port 1 View Result Date: 11/29/2023 CLINICAL DATA:  Evaluate for pneumothorax. Shortness of breath and pericardial effusion. EXAM: PORTABLE CHEST 1 VIEW COMPARISON:  11/29/2023 FINDINGS: Heart size is normal. Similar appearance of widened superior mediastinum compatible with known infiltrative mass. Trace pleural effusions. Previous small right  pneumothorax is not seen on the current exam. No interstitial edema or airspace disease. IMPRESSION: 1. Previous small right pneumothorax is not seen on the current exam. 2. Trace pleural effusions. 3. Similar appearance of widened superior mediastinum compatible with known infiltrative mass. Electronically Signed   By: Waddell Calk M.D.   On: 11/29/2023 12:11   DG Chest 1 View Result Date: 11/29/2023 CLINICAL DATA:  Pleural effusion status post thoracentesis EXAM: CHEST  1 VIEW COMPARISON:  11/28/2023 FINDINGS: Single frontal view of the chest demonstrates a stable cardiac silhouette. Continued ectasia and atherosclerosis of the thoracic aorta. Stable widening of the upper mediastinum consistent with infiltrative soft tissue mass seen on preceding CT. Decreased right pleural effusion after interval thoracentesis. Trace right apical pneumothorax volume estimated far less than 5%, with pleural separation measuring approximately 5 mm. Stable trace left pleural effusion. No acute bony abnormalities. IMPRESSION: 1.  Near complete resolution of right pleural effusion after interval thoracentesis. 2. Trace right apical pneumothorax, volume estimated far less than 5%. 3. Stable trace left pleural effusion. 4. Stable mediastinal widening compatible with known infiltrative soft tissue mass. Critical Value/emergent results were called by telephone at the time of interpretation on 11/29/2023 at 11:04 am to provider [facility] , who verbally acknowledged these results. Electronically Signed   By: Ozell Daring M.D.   On: 11/29/2023 11:12   DG CHEST PORT 1 VIEW Result Date: 11/28/2023 CLINICAL DATA:  83 year old female with hypoxia and cough. EXAM: PORTABLE CHEST 1 VIEW COMPARISON:  Portable chest yesterday and earlier. FINDINGS: Portable AP semi upright view at 0824 hours. Large lung volumes, centrilobular emphysema on recent CTA. Bilateral pleural effusions. Stable cardiac contour. Abnormal superior mediastinum, tumor or  metastatic ex nodal infiltration on recent CTA. Calcified aortic atherosclerosis. No pneumothorax or pulmonary edema. Stable ventilation since yesterday. Negative visible bowel gas.  Stable visualized osseous structures. IMPRESSION: 1. 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 See Chest CTA details 11/26/2023. 2. Stable ventilation with bilateral pleural effusions superimposed on emphysema. Electronically Signed   By: VEAR Hurst M.D.   On: 11/28/2023 12:15   ECHOCARDIOGRAM LIMITED Result Date: 11/28/2023    ECHOCARDIOGRAM LIMITED REPORT   Patient Name:   JOELLEN TULLOS Date of Exam: 11/28/2023 Medical Rec #:  [id number]    Height:       59.0 in Accession #:    [id number]   Weight:       80.0 lb Date of Birth:  12/29/1940     BSA:          1.249 m Patient Age:    83 years     BP:           141/92 mmHg Patient Gender: F            HR:           112 bpm. Exam Location:  Inpatient Procedure: 2D Echo, Limited Echo, Cardiac Doppler and Color Doppler (Both            Spectral and Color Flow Doppler were utilized during procedure). Indications:    Pericardial effusion  History:        Patient has prior history of Echocardiogram examinations, most                 recent 11/27/2023. COPD; Risk Factors:Hypertension and                 Dyslipidemia.  Sonographer:    Therisa Crouch Referring Phys: [id number] SUNIT TOLIA IMPRESSIONS  1. Limited echo to evaluate pericardial effusion. Moderate to large pericardial effusion, measures 1.9cm adjacent to RV. There is RV indentation during early diastole but no clear RV diastolic collapse. IVC is small with <50% respiratory variation. Not consistent with tamponade at this time but would monitor closely for clinical signs of tamponade and continue to monitor serial echoes FINDINGS  Left Ventricle: Pericardium: Limited echo to evaluate pericardial effusion. Moderate to large pericardial effusion, measures 1.9cm adjacent to RV. There is RV indentation durign early diastole  but no clear RV diastolic collapse. IVC is small with <50% respiratory variation. Not consistent with tamponade at this time but would monitor closely for clinical signs of tamponade and suggest repeat echo in 48 hours. IVC IVC diam: 1.20 cm Lonni Nanas MD Electronically signed by Lonni Nanas MD Signature Date/Time: 11/28/2023/11:47:53 AM    Final     Cardiac Studies  Echo:  Pericardium: Limited echo to evaluate pericardial effusion. Moderate to  large pericardial effusion, measures 1.9cm adjacent to RV. There is RV  indentation durign early diastole but no clear RV diastolic collapse. IVC  is small with <50% respiratory  variation. Not consistent with tamponade at this time but would monitor  closely for clinical signs of tamponade and suggest repeat echo in 48  hours.  .   Patient Profile     83 y.o. female seen by primary care for worsening fever, cough and shortness of breath. She was tx for AECOPD. DDimer was elevated and CXR showed bibasilar and R apical pulmonary opacities, possible R perihilar nodule. 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 She was referred to the ED.   Assessment & Plan    Pericardial effusion:    I reviewed this with TCTS and primary team.   For pericardiocentesis today for diagnostic and therapeutic benefit.  Risk benefits described to the patient.  Discussed with Dr. Mady.    Mediastinal mass:    Images reviewed.  Will look for cytology on pleural fluid or pericardial fluid when available.  Plan per primary team.   Bilateral pleural effusions:  Pulmonary has seen.  Thoracentesis done with 400 cc of fluid removed.  LDH 209, protein 3.9.  Cytology pending.    Consistent with exudate.      For questions or updates, please contact [facility] Please consult [email or web address] for contact info under Cardiology/STEMI.   Signed, Lynwood Schilling, MD   11/30/2023, 8:23 AM

## 2023-11-30 NOTE — Progress Notes (Signed)
 Progress Note  Patient Name: Tammy Wolfe Date of Encounter: 11/30/2023  Primary Cardiologist:   None   Subjective   No pain.  No SOB.  Slept better last night.    Inpatient Medications    Scheduled Meds:  arformoterol   15 mcg Nebulization BID   azithromycin   500 mg Oral Daily   budesonide  (PULMICORT ) nebulizer solution  0.25 mg Nebulization BID   furosemide   40 mg Intravenous Daily   guaiFENesin   600 mg Oral BID   heparin  injection (subcutaneous)  5,000 Units Subcutaneous Q8H   nystatin   5 mL Oral QID   revefenacin   175 mcg Nebulization Daily   sodium chloride  flush  3 mL Intravenous Q12H   Continuous Infusions:  cefTRIAXone  (ROCEPHIN )  IV 1 g (11/29/23 1714)   PRN Meds: acetaminophen  **OR** acetaminophen , ALPRAZolam , hydrALAZINE , ipratropium-albuterol , methocarbamol  (ROBAXIN ) injection, ondansetron  **OR** ondansetron  (ZOFRAN ) IV, oxyCODONE , polyethylene glycol   Vital Signs    Vitals:   11/29/23 1955 11/29/23 2019 11/29/23 2320 11/30/23 0421  BP:  127/78 118/69 124/81  Pulse:  (!) 116 (!) 113 (!) 107  Resp:  16 18 18   Temp:  98 F (36.7 C) 97.9 F (36.6 C) 97.9 F (36.6 C)  TempSrc:  Oral Oral Oral  SpO2: 97% 98% 94% 94%  Weight:      Height:        Intake/Output Summary (Last 24 hours) at 11/30/2023 0823 Last data filed at 11/30/2023 9388 Gross per 24 hour  Intake 240 ml  Output 700 ml  Net -460 ml   Filed Weights   11/27/23 1418  Weight: 36.3 kg    Telemetry    ST with rare ectopy.  - Personally Reviewed  ECG    NA - Personally Reviewed  Physical Exam   GEN: No  acute distress.   Neck: No  JVD Cardiac: RRR, no murmurs, rubs, or gallops.  Respiratory: Clear   to auscultation bilaterally. GI: Soft, nontender, non-distended, normal bowel sounds  MS:  No edema; No deformity. Neuro:   Nonfocal  Psych: Oriented and appropriate     Labs    Chemistry Recent Labs  Lab 11/23/23 1546 11/27/23 1430 11/28/23 0229 11/30/23 0338  NA 128*  133* 133* 134*  K 4.6 5.4* 5.0 4.0  CL 97 102 104 102  CO2 21 22 19* 21*  GLUCOSE 145* 119* 89 115*  BUN 73* 78* 85* 85*  CREATININE 1.53* 1.51* 1.61* 1.45*  CALCIUM 9.8 9.9 9.3 8.9  PROT 8.0  --  7.5  --   ALBUMIN  3.7  --  3.1*  --   AST 81*  --  47*  --   ALT 231*  --  108*  --   ALKPHOS 692*  --  299*  --   BILITOT 0.5  --  0.6  --   GFRNONAA  --  34* 32* 36*  ANIONGAP  --  9 10 11      Hematology Recent Labs  Lab 11/27/23 1430 11/28/23 0229 11/30/23 0338  WBC 15.8* 12.5* 9.7  RBC 4.34 4.30 4.12  HGB 11.5* 11.5* 10.7*  HCT 36.8 35.8* 34.4*  MCV 84.8 83.3 83.5  MCH 26.5 26.7 26.0  MCHC 31.3 32.1 31.1  RDW 16.1* 16.1* 15.7*  PLT 544* 535* 482*    Cardiac EnzymesNo results for input(s): TROPONINI in the last 168 hours. No results for input(s): TROPIPOC in the last 168 hours.   BNP Recent Labs  Lab 11/23/23 1546 11/27/23 1430  BNP  --  60.1  PROBNP 45.0  --      DDimer  Recent Labs  Lab 11/23/23 1546  DDIMER 1.15*     Radiology    DG CHEST PORT 1 VIEW Result Date: 11/30/2023 CLINICAL DATA:  Pleural effusion EXAM: PORTABLE CHEST 1 VIEW COMPARISON:  Prior chest x-ray 11/29/2023 FINDINGS: Cardiac and mediastinal contours are unchanged. Similar appearance of soft tissue density within the mediastinum. Atherosclerotic calcifications again noted in the transverse aorta. Similar appearance of biapical pleuroparenchymal scarring. The lungs appear hyperinflated with diffuse bronchitic changes and areas of lucency likely reflecting underlying emphysema. Trace left pleural effusion. No pneumothorax. IMPRESSION: 1. Trace left pleural effusion. 2. Similar appearance of the lungs which appear hyperinflated with coarse bronchitic changes and areas of lucency suggesting a combination of emphysema and chronic bronchitis. 3. Aortic atherosclerotic vascular calcifications. 4. Similar appearance of increased soft tissue density within the upper mediastinum. Electronically Signed    By: Wilkie Lent M.D.   On: 11/30/2023 07:48   US  THORACENTESIS ASP PLEURAL SPACE W/IMG GUIDE Result Date: 11/29/2023 INDICATION: 83 year old female with history of COPD presented with shortness of breast. Previous imaging showed bilateral pleural effusion. Request for therapeutic and diagnostic thoracentesis. EXAM: ULTRASOUND GUIDED RIGHT THORACENTESIS MEDICATIONS: 5 mL 1% lidocaine  COMPLICATIONS: SIR Level A - No therapy, no consequence. PROCEDURE: An ultrasound guided thoracentesis was thoroughly discussed with the patient and questions answered. The benefits, risks, alternatives and complications were also discussed. The patient understands and wishes to proceed with the procedure. Written consent was obtained. Ultrasound was performed to localize and mark an adequate pocket of fluid in the right chest. The area was then prepped and draped in the normal sterile fashion. 1% Lidocaine  was used for local anesthesia. Under ultrasound guidance a 6 Fr Safe-T-Centesis catheter was introduced. Thoracentesis was performed. The catheter was removed and a dressing applied. FINDINGS: A total of approximately 400 mL of hazy amber fluid was removed. Samples were sent to the laboratory as requested by the clinical team. Post-procedure chest x-ray showed small apical right pneumothorax. Chest x-ray was repeated in 1 hour, which showed resolution of the apical pneumothorax. IMPRESSION: Successful ultrasound guided right thoracentesis yielding 400 mL of pleural fluid. Performed by: Aimee Han, PA-C Electronically Signed   By: Cordella Banner   On: 11/29/2023 16:07   DG Chest Port 1 View Result Date: 11/29/2023 CLINICAL DATA:  Evaluate for pneumothorax. Shortness of breath and pericardial effusion. EXAM: PORTABLE CHEST 1 VIEW COMPARISON:  11/29/2023 FINDINGS: Heart size is normal. Similar appearance of widened superior mediastinum compatible with known infiltrative mass. Trace pleural effusions. Previous small right  pneumothorax is not seen on the current exam. No interstitial edema or airspace disease. IMPRESSION: 1. Previous small right pneumothorax is not seen on the current exam. 2. Trace pleural effusions. 3. Similar appearance of widened superior mediastinum compatible with known infiltrative mass. Electronically Signed   By: Waddell Calk M.D.   On: 11/29/2023 12:11   DG Chest 1 View Result Date: 11/29/2023 CLINICAL DATA:  Pleural effusion status post thoracentesis EXAM: CHEST  1 VIEW COMPARISON:  11/28/2023 FINDINGS: Single frontal view of the chest demonstrates a stable cardiac silhouette. Continued ectasia and atherosclerosis of the thoracic aorta. Stable widening of the upper mediastinum consistent with infiltrative soft tissue mass seen on preceding CT. Decreased right pleural effusion after interval thoracentesis. Trace right apical pneumothorax volume estimated far less than 5%, with pleural separation measuring approximately 5 mm. Stable trace left pleural effusion. No acute bony abnormalities. IMPRESSION: 1.  Near complete resolution of right pleural effusion after interval thoracentesis. 2. Trace right apical pneumothorax, volume estimated far less than 5%. 3. Stable trace left pleural effusion. 4. Stable mediastinal widening compatible with known infiltrative soft tissue mass. Critical Value/emergent results were called by telephone at the time of interpretation on 11/29/2023 at 11:04 am to provider [facility] , who verbally acknowledged these results. Electronically Signed   By: Ozell Daring M.D.   On: 11/29/2023 11:12   DG CHEST PORT 1 VIEW Result Date: 11/28/2023 CLINICAL DATA:  83 year old female with hypoxia and cough. EXAM: PORTABLE CHEST 1 VIEW COMPARISON:  Portable chest yesterday and earlier. FINDINGS: Portable AP semi upright view at 0824 hours. Large lung volumes, centrilobular emphysema on recent CTA. Bilateral pleural effusions. Stable cardiac contour. Abnormal superior mediastinum, tumor or  metastatic ex nodal infiltration on recent CTA. Calcified aortic atherosclerosis. No pneumothorax or pulmonary edema. Stable ventilation since yesterday. Negative visible bowel gas.  Stable visualized osseous structures. IMPRESSION: 1. 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 See Chest CTA details 11/26/2023. 2. Stable ventilation with bilateral pleural effusions superimposed on emphysema. Electronically Signed   By: VEAR Hurst M.D.   On: 11/28/2023 12:15   ECHOCARDIOGRAM LIMITED Result Date: 11/28/2023    ECHOCARDIOGRAM LIMITED REPORT   Patient Name:   Tammy Wolfe Date of Exam: 11/28/2023 Medical Rec #:  [id number]    Height:       59.0 in Accession #:    [id number]   Weight:       80.0 lb Date of Birth:  12/29/1940     BSA:          1.249 m Patient Age:    83 years     BP:           141/92 mmHg Patient Gender: F            HR:           112 bpm. Exam Location:  Inpatient Procedure: 2D Echo, Limited Echo, Cardiac Doppler and Color Doppler (Both            Spectral and Color Flow Doppler were utilized during procedure). Indications:    Pericardial effusion  History:        Patient has prior history of Echocardiogram examinations, most                 recent 11/27/2023. COPD; Risk Factors:Hypertension and                 Dyslipidemia.  Sonographer:    Therisa Crouch Referring Phys: [id number] SUNIT TOLIA IMPRESSIONS  1. Limited echo to evaluate pericardial effusion. Moderate to large pericardial effusion, measures 1.9cm adjacent to RV. There is RV indentation during early diastole but no clear RV diastolic collapse. IVC is small with <50% respiratory variation. Not consistent with tamponade at this time but would monitor closely for clinical signs of tamponade and continue to monitor serial echoes FINDINGS  Left Ventricle: Pericardium: Limited echo to evaluate pericardial effusion. Moderate to large pericardial effusion, measures 1.9cm adjacent to RV. There is RV indentation durign early diastole  but no clear RV diastolic collapse. IVC is small with <50% respiratory variation. Not consistent with tamponade at this time but would monitor closely for clinical signs of tamponade and suggest repeat echo in 48 hours. IVC IVC diam: 1.20 cm Lonni Nanas MD Electronically signed by Lonni Nanas MD Signature Date/Time: 11/28/2023/11:47:53 AM    Final     Cardiac Studies  Echo:  Pericardium: Limited echo to evaluate pericardial effusion. Moderate to  large pericardial effusion, measures 1.9cm adjacent to RV. There is RV  indentation durign early diastole but no clear RV diastolic collapse. IVC  is small with <50% respiratory  variation. Not consistent with tamponade at this time but would monitor  closely for clinical signs of tamponade and suggest repeat echo in 48  hours.  .   Patient Profile     83 y.o. female seen by primary care for worsening fever, cough and shortness of breath. She was tx for AECOPD. DDimer was elevated and CXR showed bibasilar and R apical pulmonary opacities, possible R perihilar nodule. 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 She was referred to the ED.   Assessment & Plan    Pericardial effusion:    I reviewed this with TCTS and primary team.   For pericardiocentesis today for diagnostic and therapeutic benefit.  Risk benefits described to the patient.  Discussed with Dr. Mady.    Mediastinal mass:    Images reviewed.  Will look for cytology on pleural fluid or pericardial fluid when available.  Plan per primary team.   Bilateral pleural effusions:  Pulmonary has seen.  Thoracentesis done with 400 cc of fluid removed.  LDH 209, protein 3.9.  Cytology pending.    Consistent with exudate.      For questions or updates, please contact [facility] Please consult [email or web address] for contact info under Cardiology/STEMI.   Signed, Lynwood Schilling, MD   11/30/2023, 8:23 AM

## 2023-12-01 ENCOUNTER — Encounter (HOSPITAL_COMMUNITY): Payer: Self-pay | Admitting: Internal Medicine

## 2023-12-01 ENCOUNTER — Inpatient Hospital Stay (HOSPITAL_COMMUNITY)

## 2023-12-01 DIAGNOSIS — I3139 Other pericardial effusion (noninflammatory): Secondary | ICD-10-CM

## 2023-12-01 DIAGNOSIS — E43 Unspecified severe protein-calorie malnutrition: Secondary | ICD-10-CM | POA: Insufficient documentation

## 2023-12-01 DIAGNOSIS — J9601 Acute respiratory failure with hypoxia: Secondary | ICD-10-CM | POA: Diagnosis not present

## 2023-12-01 DIAGNOSIS — N1832 Chronic kidney disease, stage 3b: Secondary | ICD-10-CM | POA: Diagnosis not present

## 2023-12-01 DIAGNOSIS — J432 Centrilobular emphysema: Secondary | ICD-10-CM | POA: Diagnosis not present

## 2023-12-01 DIAGNOSIS — J90 Pleural effusion, not elsewhere classified: Secondary | ICD-10-CM | POA: Diagnosis not present

## 2023-12-01 LAB — CBC
HCT: 36.6 % (ref 36.0–46.0)
Hemoglobin: 11.3 g/dL — ABNORMAL LOW (ref 12.0–15.0)
MCH: 25.6 pg — ABNORMAL LOW (ref 26.0–34.0)
MCHC: 30.9 g/dL (ref 30.0–36.0)
MCV: 83 fL (ref 80.0–100.0)
Platelets: 428 K/uL — ABNORMAL HIGH (ref 150–400)
RBC: 4.41 MIL/uL (ref 3.87–5.11)
RDW: 15.6 % — ABNORMAL HIGH (ref 11.5–15.5)
WBC: 12.6 K/uL — ABNORMAL HIGH (ref 4.0–10.5)
nRBC: 0 % (ref 0.0–0.2)

## 2023-12-01 LAB — ECHOCARDIOGRAM LIMITED
AR max vel: 3.16 cm2
AV Area VTI: 3.35 cm2
AV Area mean vel: 3.15 cm2
AV Mean grad: 2.5 mmHg
AV Peak grad: 4.7 mmHg
Ao pk vel: 1.09 m/s
Area-P 1/2: 6.54 cm2
Height: 59 in
S' Lateral: 1.9 cm
Weight: 1280 [oz_av]

## 2023-12-01 LAB — BASIC METABOLIC PANEL WITH GFR
Anion gap: 9 (ref 5–15)
BUN: 78 mg/dL — ABNORMAL HIGH (ref 8–23)
CO2: 21 mmol/L — ABNORMAL LOW (ref 22–32)
Calcium: 9.6 mg/dL (ref 8.9–10.3)
Chloride: 107 mmol/L (ref 98–111)
Creatinine, Ser: 1.29 mg/dL — ABNORMAL HIGH (ref 0.44–1.00)
GFR, Estimated: 41 mL/min — ABNORMAL LOW (ref >60)
Glucose, Bld: 108 mg/dL — ABNORMAL HIGH (ref 70–99)
Potassium: 4.4 mmol/L (ref 3.5–5.1)
Sodium: 137 mmol/L (ref 135–145)

## 2023-12-01 LAB — PROTEIN, BODY FLUID (OTHER): Total Protein, Body Fluid Other: 4.9 g/dL

## 2023-12-01 LAB — GLUCOSE, BODY FLUID OTHER: Glucose, Body Fluid Other: 113 mg/dL

## 2023-12-01 LAB — MAGNESIUM: Magnesium: 2.7 mg/dL — ABNORMAL HIGH (ref 1.7–2.4)

## 2023-12-01 LAB — LD, BODY FLUID (OTHER): LD, Body Fluid: 482 IU/L

## 2023-12-01 MED ORDER — METOPROLOL TARTRATE 5 MG/5ML IV SOLN
5.0000 mg | Freq: Once | INTRAVENOUS | Status: AC | PRN
  Administered 2023-12-01: 5 mg via INTRAVENOUS
  Filled 2023-12-01: qty 5

## 2023-12-01 MED ORDER — AMIODARONE HCL IN DEXTROSE 360-4.14 MG/200ML-% IV SOLN
30.0000 mg/h | INTRAVENOUS | Status: DC
  Administered 2023-12-01 – 2023-12-04 (×6): 30 mg/h via INTRAVENOUS
  Filled 2023-12-01 (×6): qty 200

## 2023-12-01 MED ORDER — METOPROLOL TARTRATE 25 MG PO TABS
25.0000 mg | ORAL_TABLET | Freq: Two times a day (BID) | ORAL | Status: DC
  Administered 2023-12-01: 25 mg via ORAL
  Filled 2023-12-01: qty 1

## 2023-12-01 NOTE — Progress Notes (Signed)
*  PRELIMINARY RESULTS* Echocardiogram 2D Echocardiogram has been performed.  Benard FORBES Stallion 12/01/2023, 9:34 AM

## 2023-12-01 NOTE — Progress Notes (Signed)
 Nutrition Follow-up  DOCUMENTATION CODES:   Underweight, Severe malnutrition in context of chronic illness  INTERVENTION:   Requesting new measured weight; no weight since admission on 7/04, question if actually measured  Liberalize diet to REGULAR -Encouraged small, frequent meals -RD ordering snacks TID between meals -Room Service: Yes with Assist  Ensure Plus High Protein po TID, each supplement provides 350 kcal and 20 grams of protein. Reinforced importance of high calorie, high protein oral nutrition supplements post discharge.   Continue MVI with Minerals   NUTRITION DIAGNOSIS:   Severe Malnutrition related to chronic illness as evidenced by severe fat depletion, severe muscle depletion, percent weight loss.  Being addressed  GOAL:   Patient will meet greater than or equal to 90% of their needs  Progressing  MONITOR:   PO intake, Supplement acceptance, Labs, Weight trends, Skin, I & O's  REASON FOR ASSESSMENT:   Consult Assessment of nutrition requirement/status, Diet education  ASSESSMENT:   83 yo female admitted with worsening shortness of breath, fatigue and weight loss with pleural effusions and pericardial effusions, also noted to have mediastinal mass concerning for malignancy on exam. PMH includes COPD, hx of breast cancer s/p bilateral mastectomy, hx tobacco and EtOH abuse, EtOH hepatitis, pancreatitis, HTN, HLD, CKD 3b.  7/03 CTA chest: moderate bilateral pleural effusions, moderate pericardial effusion, mediastinal mass-concerning for malignancy 7/04 Admitted, R thoracentesis-400 mL removed 7/07 Pericardiocentesis  Noted oncology consulted for mediastinal mass Cytology of pleural and pericardial fluid pending. Pericardial drain with 60 mL out  Pt ate banana and grits and drank all her milk this morning. Lunch tray untouched. Limited documentation of po intake, 2 meals documented on 7/5 0-25% but no other intake recorded. Pt is receiving Ensure,  likes the vanilla, but has not had one yet today  Pt reports worsening SOB started about 6 weeks ago. Pt reports breathing difficulty affected her ability to take po; pt had been trying to eat soft foods like greek yogurt and drinking protein shakes ([place]: provides 170 kcals and 20 g of protein). Pt also reports she can only tolerate small amounts of food at a time, +early satiety, +fatigue with chewing.   Discussed plan to liberalize diet and add snacks between meals to mimic small, frequent meals. Also discussed continued use of protein shakes, like Ensure Plus HP, while inpatient and once discharged. Discussed using oral nutrition supplements that are BOTH high in Calorie and Protein.   UBW 90 pounds; pt reports she has weighed 90 pounds for most of her adult life. She has always been small; heigh 4'11. Pt acknowledges wt loss of 10 pounds in 1 month  Current wt 80 pounds (36.3 kg) but this weight is from 7/4 (admission date). Need new measured weight  Pt lives alone in 2 story condo. Prior to this, pt performs ADLs on her own, relatively active (likes to garden, goes up and down her stairs). Pt does take breaks frequently   Pt indicates she smoked cigarettes her whole like but quit 3 months ago. Currently social EtOH use.   Labs: BUN 78 Creatinine 1.29 Sodium 137 (wdl) Potassium 4.4 (wdl) Magnesium 2.7 (H)   Meds:  Lasix  MVI with [place]  NUTRITION - FOCUSED PHYSICAL EXAM:  Flowsheet Row Most Recent Value  Orbital Region Severe depletion  Upper Arm Region Moderate depletion  Thoracic and Lumbar Region Severe depletion  Buccal Region Severe depletion  Temple Region Severe depletion  Clavicle Bone Region Severe depletion  Clavicle and Acromion Bone Region Severe depletion  Scapular Bone Region Severe depletion  Dorsal Hand Severe depletion  Patellar Region Severe depletion  Anterior Thigh Region Severe depletion  Posterior Calf Region Severe depletion  Edema (RD  Assessment) None    Diet Order:   Diet Order             Diet regular Room service appropriate? Yes with Assist; Fluid consistency: Thin  Diet effective now                   EDUCATION NEEDS:   Education needs have been addressed  Skin:  Skin Assessment: Reviewed RN Assessment  Last BM:  7/4  Height:   Ht Readings from Last 1 Encounters:  11/27/23 4' 11 (1.499 m)    Weight:   Wt Readings from Last 1 Encounters:  11/27/23 36.3 kg    BMI:  Body mass index is 16.16 kg/m.  Estimated Nutritional Needs:   Kcal:  1250-1450 kcal  Protein:  55-75 g  Fluid:  >/=1500 ml   Betsey Finger MS, RDN, LDN, CNSC Registered Dietitian 3 Clinical Nutrition RD Inpatient Contact Info in Amion

## 2023-12-01 NOTE — Progress Notes (Signed)
 NAME:  Tammy Wolfe, MRN:  [id number], DOB:  1941/02/24, LOS: 4 ADMISSION DATE:  11/27/2023, CONSULTATION DATE:  12/01/23 REFERRING MD:  TRH, CHIEF COMPLAINT:  DOE/SOB   History of Present Illness:  83 year old severe emphysema presents to the ED at the direction of PCP with abnormal CT findings.  Diagnosis COPD Per PCP notes.  I see no pulmonary function test to confirm or validate this diagnosis.  Recently treated with exacerbation per PCP note.  On Trelegy at baseline.  CTA PE protocol was obtained.  No PE seen but did demonstrate bilateral moderate pleural effusions as well as pericardial effusion and concern for mediastinal mass.  Severe emphysematous changes noted.  The pericardial effusion and pleural effusions prompted recommendation to go to the ED.  She is tachycardic.  Normotensive.  On room air satting high 90s per initial flowsheet data..  She was placed on nasal cannula shortly thereafter.  Sats in the high 90s.  She endorses shortness of breath.  Dyspnea exertion. Feels better with O2.  Endorses 10 pound weight loss over the last 1 month.  Relatively sudden onset of breathing decompensation over the last few weeks.  Pertinent  Medical History  Breast cancer Emphysema HTN HLD H/o ETOH abuse, ETOH hepatitis and pancreatitis  Significant Hospital Events: Including procedures, antibiotic start and stop dates in addition to other pertinent events   7/4 admission, thora on R 7/7 pericardial drain  Interim History / Subjective:  Afib with RVR 140-160's overnight, now in ST 108 after IV lopressor  5, then PO started Remains asymptomatic Pericardial drain total output 60ml since insertion Objective    Blood pressure 108/70, pulse (!) 105, temperature 98 F (36.7 C), temperature source Oral, resp. rate 13, height 4' 11 (1.499 m), weight 36.3 kg, SpO2 99%.    FiO2 (%):  [32 %] 32 %   Intake/Output Summary (Last 24 hours) at 12/01/2023 0753 Last data filed at 12/01/2023 0600 Gross  per 24 hour  Intake 595.19 ml  Output 660 ml  Net -64.81 ml   Filed Weights   11/27/23 1418  Weight: 36.3 kg    Examination: General:  pleasant cachectic elderly female sitting upright in bed in NAD HEENT: MM pink/moist, pupils 4/r Neuro: Alert, appropriate, MAE  CV: rr, ST 110, no murmur, pericardial drain with hemovac present PULM:  non labored, few exp wheezes on left otherwise clear and diminished in bases GI: soft, bs+, NT, purwick Extremities: warm/dry, no edema  Skin: no rashes   Afebrile Labs> K 4.4, Mag 2.7, BUN/sCr 85/1.45> 78/1.29, WBC 12.5> 9.7> 12.6, H/H stable, plts 428  UOP 600 ml/ 24hrs Net-1.7L  Resolved problem list   Assessment and Plan   Pericardial effusion s/p pericardial drain 7/7 Bilateral pleural effusions; exudative on the R s/p thora 6/7 Soft tissue mass in mediastinum, not definitely lung based on distribution of pulmonary vessels - pleural and pericardial fluid cytology pending, remains high concern for an advanced stage malignancy - CXR stable, trend prn  - pericardial drain per cardiology, suspect will be removed as < over 24hrs  Right upper lobe linear density/nodule: Unclear if related to mediastinal process or not. -anticipate biopsy will not be required  Acute respiratory failure with hypoxia-- likely mostly related to pleural effusions and deconditioning Severe emphysema: No PFTs to review. -con't triple inhaled therapy - supplemental O2 prn  - ongoing aggressive pulm hygiene  Afib with RVR - now back in ST, suspect due to respiratory and cardiac stress from above - cont  metoprolol  - optimize electrolytes  - remains hemodynamically stable  - SCDS only for now w/ pericardial drain, resume ppx per cardiology recs  Frailty, severe protein energy malnutrition, cachexia - cont to maximize nutrition, mobility ambulate - appreciate RD recs  CKD 3b, improving -improving sCr, stable UOP  - strict I/Os, trend renal  indices -renally dose meds, avoid nephrotoxic meds  Chronic anemia, likely due to chronic disease - H/H stable  - transfuse for Hb<7 or hemodynamically significant bleeding  Best Practice (right click and Reselect all SmartList Selections daily)   Diet/type: Regular consistency (see orders) DVT prophylaxis SCD Pressure ulcer(s): N/A GI prophylaxis: N/A Lines: N/A Foley:  N/A Code Status:  full code Last date of multidisciplinary goals of care discussion [prior]  Pt updated on plan of care, no family at bedside.  PCCM to remain primary while in ICU,  then transfer back to [facility] service   Labs   CBC: Recent Labs  Lab 11/27/23 1430 11/28/23 0229 11/30/23 0338 12/01/23 0412  WBC 15.8* 12.5* 9.7 12.6*  NEUTROABS 14.3* 10.7* 8.1*  --   HGB 11.5* 11.5* 10.7* 11.3*  HCT 36.8 35.8* 34.4* 36.6  MCV 84.8 83.3 83.5 83.0  PLT 544* 535* 482* 428*    Basic Metabolic Panel: Recent Labs  Lab 11/27/23 1430 11/28/23 0229 11/30/23 0338 11/30/23 2230 12/01/23 0412  NA 133* 133* 134*  --  137  K 5.4* 5.0 4.0 4.2 4.4  CL 102 104 102  --  107  CO2 22 19* 21*  --  21*  GLUCOSE 119* 89 115*  --  108*  BUN 78* 85* 85*  --  78*  CREATININE 1.51* 1.61* 1.45*  --  1.29*  CALCIUM 9.9 9.3 8.9  --  9.6  MG  --  3.2*  --  2.9* 2.7*   GFR: Estimated Creatinine Clearance: 18.9 mL/min (A) (by C-G formula based on SCr of 1.29 mg/dL (H)). Recent Labs  Lab 11/27/23 1430 11/27/23 1742 11/28/23 0229 11/30/23 0338 12/01/23 0412  PROCALCITON  --  0.14  --   --   --   WBC 15.8*  --  12.5* 9.7 12.6*    Liver Function Tests: Recent Labs  Lab 11/28/23 0229  AST 47*  ALT 108*  ALKPHOS 299*  BILITOT 0.6  PROT 7.5  ALBUMIN  3.1*    Critical care time: 35 mins       Lyle Pesa, MSN, AG-ACNP-BC [facility] 12/01/2023, 7:53 AM  See Amion for pager If no response to pager , please call [phone] until 7pm After 7:00 pm call Elink  [phone]

## 2023-12-01 NOTE — TOC Initial Note (Signed)
 Transition of Care [facility]) - Initial/Assessment Note    Patient Details  Name: Tammy Wolfe MRN: [id number] Date of Birth: 1940-10-21  Transition of Care [facility]) CM/SW Contact:    Sudie Erminio Deems, RN Phone Number: 12/01/2023, 3:22 PM  Clinical Narrative:  Patient presented for shortness of breath. PTA Patient was independent from home alone. Patient reports that she has children in [place] that checks in on her. Patient states she was still driving to PCP appointments with [facility]. Patient will benefit from PT/OT consult for recommendations. Case Manager will continue to follow for transition of care needs as she progresses.   Expected Discharge Plan: Home w Home Health Services Barriers to Discharge: Continued Medical Work up   Patient Goals and CMS Choice Patient states their goals for this hospitalization and ongoing recovery are:: Patient wants to feel better.   Choice offered to / list presented to : NA      Expected Discharge Plan and Services In-house Referral: NA Discharge Planning Services: CM Consult Post Acute Care Choice: Home Health Living arrangements for the past 2 months:  (Condo)                   DME Agency: NA                  Prior Living Arrangements/Services Living arrangements for the past 2 months:  (Condo) Lives with:: Self Patient language and need for interpreter reviewed:: Yes Do you feel safe going back to the place where you live?: Yes      Need for Family Participation in Patient Care: Yes (Comment) Care giver support system in place?: No (comment)   Criminal Activity/Legal Involvement Pertinent to Current Situation/Hospitalization: No - Comment as needed  Activities of Daily Living   ADL Screening (condition at time of admission) Independently performs ADLs?: Yes (appropriate for developmental age) Is the patient deaf or have difficulty hearing?: No Does the patient have difficulty seeing, even when wearing  glasses/contacts?: No Does the patient have difficulty concentrating, remembering, or making decisions?: No  Permission Sought/Granted Permission sought to share information with : Family Supports, Sports coach (Children lives in [place])                Emotional Assessment Appearance:: Appears stated age Attitude/Demeanor/Rapport: Engaged Affect (typically observed): Appropriate Orientation: : Oriented to Self, Oriented to Place Alcohol / Substance Use: Not Applicable    Admission diagnosis:  Pericardial effusion [I31.39] Lung mass [R91.8] Pleural effusion, bilateral [J90] Patient Active Problem List   Diagnosis Date Noted   Acute respiratory failure with hypoxia (HCC) 11/30/2023   Mediastinal mass 11/30/2023   Pleural effusion 11/30/2023   Pericardial effusion 11/27/2023   Pleural effusion, bilateral 11/27/2023   Recent unexplained weight loss 11/27/2023   Hoarse voice quality 11/27/2023   Cigarette smoker 11/27/2023   Chronic obstructive pulmonary disease with acute exacerbation (HCC) 11/27/2023   HX: breast cancer 11/27/2023   COPD exacerbation (HCC) 11/23/2023   Urinary frequency 11/11/2023   Tendonitis of right hand 11/13/2022   Skin lesion of chest wall 11/13/2022   B12 deficiency 11/10/2022   Peripheral neuropathy 05/07/2022   Anxiety 05/07/2022   CKD (chronic kidney disease) stage 3, GFR 30-59 ml/min (HCC) 11/04/2021   Nocturnal foot cramps 11/04/2021   Sinusitis 05/01/2021   Tachycardia 05/01/2021   Chronic left maxillary sinusitis 04/04/2021   Encounter for chronic pain management 04/25/2020   Chronic low back pain 03/12/2016   Recurrent UTI 10/10/2015   Dysuria 03/14/2015  COPD (chronic obstructive pulmonary disease) with chronic bronchitis (HCC) 03/07/2014   Coronary artery calcification seen on CT scan 10/31/2013   Hypokalemia 10/19/2013   Former smoker 09/06/2013   Breast cancer of upper-outer quadrant of left female breast (HCC) 03/22/2013    Chronic back pain 04/26/2012   Impaired glucose tolerance 04/26/2012   Anemia, unspecified 04/26/2012   Encounter for well adult exam with abnormal findings 11/13/2010   Vitamin D  deficiency 11/13/2010   ABNORMAL THYROID  FUNCTION TESTS 12/10/2009   Breast implant removal status 12/23/2008   Generalized anxiety disorder 03/19/2007   FATIGUE 03/19/2007   GOITER, MULTINODULAR 02/14/2007   Hyperlipidemia 02/14/2007   Essential hypertension 02/14/2007   Allergic rhinitis 02/14/2007   Osteoarthritis 02/14/2007   Osteoporosis 02/14/2007   HX, PERSONAL, ALCOHOLISM 02/14/2007   History of digestive system disease 02/14/2007   History of colonic polyps 02/14/2007   ALCOHOLIC HEPATITIS, HX OF 02/14/2007   PCP:  [name] ORN, MD Pharmacy:   [facility] - [place], [place] - [address] [place] [address] Phone: [phone] Fax: [phone]     Social Drivers of Health (SDOH) Social History: SDOH Screenings   Food Insecurity: No Food Insecurity (11/27/2023)  Housing: Low Risk  (11/27/2023)  Transportation Needs: No Transportation Needs (11/27/2023)  Utilities: Not At Risk (11/27/2023)  Alcohol Screen: Low Risk  (04/21/2023)  Depression (PHQ2-9): Low Risk  (11/23/2023)  Financial Resource Strain: Low Risk  (04/21/2023)  Physical Activity: Inactive (04/21/2023)  Social Connections: Socially Isolated (11/29/2023)  Stress: No Stress Concern Present (04/21/2023)  Tobacco Use: Medium Risk (11/27/2023)  Health Literacy: Adequate Health Literacy (04/21/2023)   SDOH Interventions:     Readmission Risk Interventions     No data to display

## 2023-12-01 NOTE — Significant Event (Signed)
 Patient developed new onset atrial fibrillation with RVR overnight with a rate of 150 bpm..  I gave the patient 5 mg IV metoprolol  with good result.  Her heart rates improved.  I started her on metoprolol  tartrate 12.5 mg twice daily from my rate control.  I did not start heparin  given that she is pending thoracic procedures.

## 2023-12-01 NOTE — Consult Note (Addendum)
 Advanced Heart Failure Team Consult Note   Primary Physician: [name] ORN, MD Cardiologist:  None  Reason for Consultation: Pericardial effusion  HPI:    Tammy Wolfe is seen today for evaluation of pericardial effusion at the request of Dr. Lavona with [facility].   83 y.o. female with history of COPD, history of breast cancer s/p bilateral mastectomy and 3 years of tamoxifen , hx tobacco use, HTN, HLD, CKD IIIb, hx ETOH abuse w/ ETOH hepatitis and pancreatitis.   She was recently seen by her PCP for worsening shortness of breath, cough and fever. Has lost 10 lb over the last month. She was treated for possible AECOPD. CXR demosntrated bibasilar and R apical pulmonary opacities and possible R perihilar nodule. CTA negative for PE but revealed bilateral pleural effusions, moderate pericardial effusion, mediastinal mass concerning for neoplastic process and streaky/nodular density extending from R hilum to R apex. She was referred to ED for further workup and admitted for respiratory failure requiring O2. Underwent R thoracentesis on 07/04.   Cardiology consulted d/t pericardial effusion. Echo with moderate to large pericardial effusion, no clear evidence of tamponade. Underwent pericardiocentesis on 07/07, cytology sent. Concern for advanced stage malignancy.    Went into afib with RVR early this am. She was given 5 mg metoprolol  IV and started on po metoprolol  tartrate. Patient converted to SR spontaneously, currently sinus tach.   Home Medications Prior to Admission medications   Medication Sig Start Date End Date Taking? Authorizing Provider  acetaminophen  (TYLENOL ) 325 MG tablet Take 650 mg by mouth every 6 (six) hours as needed for mild pain (pain score 1-3).   Yes [provider]  albuterol  (VENTOLIN  HFA) 108 (90 Base) MCG/ACT inhaler Inhale 2 puffs into the lungs every 6 (six) hours as needed for wheezing or shortness of breath. 11/23/23  Yes [name] ORN, MD   ALPRAZolam  (XANAX ) 0.25 MG tablet TAKE ONE TABLET THREE TIMES DAILY AS NEEDED Patient taking differently: Take 0.25 mg by mouth 3 (three) times daily. 11/10/23  Yes [name] ORN, MD  Cholecalciferol (VITAMIN D ) 1000 UNITS capsule Take 1,000 Units by mouth daily.     Yes [provider]  cyanocobalamin  1000 MCG tablet Take 1,000 mcg by mouth daily.   Yes [provider]  cyclobenzaprine  (FLEXERIL ) 5 MG tablet 1 tab by mouth at bedtime as needed for cramps and back pain 11/10/23  Yes [name] ORN, MD  Fluticasone-Umeclidin-Vilant (TRELEGY ELLIPTA ) 100-62.5-25 MCG/ACT AEPB Inhale 1 puff into the lungs daily. 11/10/23  Yes [name] ORN, MD  HYDROcodone  bit-homatropine [facility]) 5-1.5 MG/5ML syrup Take 5 mLs by mouth every 6 (six) hours as needed for up to 10 days. Patient taking differently: Take 5 mLs by mouth every 6 (six) hours as needed for cough. 11/23/23 12/03/23 Yes [name] ORN, MD  HYDROcodone -acetaminophen  (NORCO/VICODIN) 5-325 MG tablet Take 1 tablet by mouth 2 (two) times daily as needed. Patient taking differently: Take 1 tablet by mouth 2 (two) times daily as needed for moderate pain (pain score 4-6). 11/10/23  Yes [name] ORN, MD  losartan  (COZAAR ) 100 MG tablet Take 1 tablet (100 mg total) by mouth daily. 11/10/23  Yes [name] ORN, MD  predniSONE  (DELTASONE ) 10 MG tablet 3 tabs by mouth per day for 3 days,2tabs per day for 3 days,1tab per day for 3 days 11/23/23  Yes [name] ORN, MD  trimethoprim  (TRIMPEX ) 100 MG tablet Take 1 tablet (100 mg total) by mouth daily. Patient  taking differently: Take 100 mg by mouth every evening. 11/10/23  Yes [name] ORN, MD    Past Medical History: Past Medical History:  Diagnosis Date   ABNORMAL THYROID  FUNCTION TESTS 12/10/2009   ALCOHOLIC HEPATITIS, HX OF 02/14/2007   ALLERGIC RHINITIS 02/14/2007   Anemia, unspecified 04/26/2012   ANXIETY 03/19/2007   Breast cancer (HCC)    Breast implant removal status 12/23/2008    COLONIC POLYPS, HX OF 02/14/2007   COPD (chronic obstructive pulmonary disease) (HCC)    Coronary artery calcification seen on CT scan 10/31/2013   GOITER, MULTINODULAR 02/14/2007   HX, PERSONAL, ALCOHOLISM 02/14/2007   HYPERLIPIDEMIA 02/14/2007   HYPERTENSION 02/14/2007   Insomnia    OSTEOARTHRITIS 02/14/2007   OSTEOPOROSIS 02/14/2007   PANCREATITIS, HX OF 02/14/2007   Smoker 09/06/2013    Past Surgical History: Past Surgical History:  Procedure Laterality Date   BREAST SURGERY  2010   silicone prepectoral implants explanted by Dr. Marcus after rupture   BREAST SURGERY     left simple mastectomy and left axillary sentinel node biopsy, right prophylactic mastectomy   CATARACT EXTRACTION     NASAL SINUS SURGERY Left 06/14/2021   Procedure: ENDOSCOPIC MAXILLARY ANTROSTOMY WITH REMOVAL OF TISSUE;  Surgeon: Jesus Oliphant, MD;  Location: [facility] OR;  Service: ENT;  Laterality: Left;   PERICARDIOCENTESIS N/A 11/30/2023   Procedure: PERICARDIOCENTESIS;  Surgeon: Mady Bruckner, MD;  Location: [facility] INVASIVE CV LAB;  Service: Cardiovascular;  Laterality: N/A;   peridontal  2010   s/p jaw and periodontal surgury  2009    Family History: Family History  Adopted: Yes  Problem Relation Age of Onset   Hyperlipidemia Brother     Social History: Social History   Socioeconomic History   Marital status: Divorced    Spouse name: Not on file   Number of children: 3   Years of education: Not on file   Highest education level: Not on file  Occupational History   Occupation: retired Visual merchandiser  Tobacco Use   Smoking status: Former    Current packs/day: 0.00    Types: Cigarettes    Quit date: 01/24/2021    Years since quitting: 2.8   Smokeless tobacco: Never   Tobacco comments:    smoking about 4 cigs/day (as of 04/10/16)-gwd  Vaping Use   Vaping status: Never Used  Substance and Sexual Activity   Alcohol use: Not Currently    Alcohol/week: 0.0 standard drinks of alcohol    Comment: one  glass/wine occasional/ no drinking now   Drug use: No   Sexual activity: Not Currently  Other Topics Concern   Not on file  Social History Narrative   Not on file   Social Drivers of Health   Financial Resource Strain: Low Risk  (04/21/2023)   Overall Financial Resource Strain (CARDIA)    Difficulty of Paying Living Expenses: Not hard at all  Food Insecurity: No Food Insecurity (11/27/2023)   Hunger Vital Sign    Worried About Running Out of Food in the Last Year: Never true    Ran Out of Food in the Last Year: Never true  Transportation Needs: No Transportation Needs (11/27/2023)   PRAPARE - Administrator, Civil Service (Medical): No    Lack of Transportation (Non-Medical): No  Physical Activity: Inactive (04/21/2023)   Exercise Vital Sign    Days of Exercise per Week: 0 days    Minutes of Exercise per Session: 0 min  Stress: No Stress Concern Present (04/21/2023)  [employer] of Occupational Health - Occupational Stress Questionnaire    Feeling of Stress : Not at all  Social Connections: Socially Isolated (11/29/2023)   Social Connection and Isolation Panel    Frequency of Communication with Friends and Family: Three times a week    Frequency of Social Gatherings with Friends and Family: Three times a week    Attends Religious Services: Never    Active Member of Clubs or Organizations: No    Attends Banker Meetings: Never    Marital Status: Divorced    Allergies:  Allergies  Allergen Reactions   Tetracycline Anaphylaxis   Lovastatin  Other (See Comments)    Muscle cramps   Pravastatin  Other (See Comments)    Leg cramps    Objective:    Vital Signs:   Temp:  [97.8 F (36.6 C)-98.7 F (37.1 C)] 98.1 F (36.7 C) (07/08 0812) Pulse Rate:  [91-146] 112 (07/08 0800) Resp:  [13-35] 24 (07/08 0800) BP: (99-147)/(63-107) 112/70 (07/08 0800) SpO2:  [88 %-100 %] 89 % (07/08 0800) FiO2 (%):  [32 %] 32 % (07/07 1921) Last BM Date :  11/27/23  Weight change: Filed Weights   11/27/23 1418  Weight: 36.3 kg    Intake/Output:   Intake/Output Summary (Last 24 hours) at 12/01/2023 0836 Last data filed at 12/01/2023 0600 Gross per 24 hour  Intake 595.19 ml  Output 660 ml  Net -64.81 ml      Physical Exam    General:  Cachectic elderly female Neck: no JVD Cor: Regular rate & rhythm, tachy. No rubs, gallops or murmurs. + pericardial drain Lungs: diminished Abdomen: soft, nontender, nondistended.  Extremities: no edema Neuro: alert & orientedx3. Affect pleasant   Telemetry   SR/ST 90s-100s  Labs   Basic Metabolic Panel: Recent Labs  Lab 11/27/23 1430 11/28/23 0229 11/30/23 0338 11/30/23 2230 12/01/23 0412  NA 133* 133* 134*  --  137  K 5.4* 5.0 4.0 4.2 4.4  CL 102 104 102  --  107  CO2 22 19* 21*  --  21*  GLUCOSE 119* 89 115*  --  108*  BUN 78* 85* 85*  --  78*  CREATININE 1.51* 1.61* 1.45*  --  1.29*  CALCIUM 9.9 9.3 8.9  --  9.6  MG  --  3.2*  --  2.9* 2.7*    Liver Function Tests: Recent Labs  Lab 11/28/23 0229  AST 47*  ALT 108*  ALKPHOS 299*  BILITOT 0.6  PROT 7.5  ALBUMIN  3.1*   No results for input(s): LIPASE, AMYLASE in the last 168 hours. No results for input(s): AMMONIA in the last 168 hours.  CBC: Recent Labs  Lab 11/27/23 1430 11/28/23 0229 11/30/23 0338 12/01/23 0412  WBC 15.8* 12.5* 9.7 12.6*  NEUTROABS 14.3* 10.7* 8.1*  --   HGB 11.5* 11.5* 10.7* 11.3*  HCT 36.8 35.8* 34.4* 36.6  MCV 84.8 83.3 83.5 83.0  PLT 544* 535* 482* 428*    Cardiac Enzymes: No results for input(s): CKTOTAL, CKMB, CKMBINDEX, TROPONINI in the last 168 hours.  BNP: BNP (last 3 results) Recent Labs    11/27/23 1430  BNP 60.1    ProBNP (last 3 results) Recent Labs    11/23/23 1546  PROBNP 45.0     CBG: No results for input(s): GLUCAP in the last 168 hours.  Coagulation Studies: No results for input(s): LABPROT, INR in the last 72 hours.   Imaging    DG CHEST PORT 1 VIEW Result Date:  12/01/2023 CLINICAL DATA:  Pleural effusion. EXAM: PORTABLE CHEST 1 VIEW COMPARISON:  11/30/2023 FINDINGS: Trace left pleural effusion. No pulmonary edema or focal consolidation. Interstitial markings are diffusely coarsened with chronic features. Cardiopericardial silhouette is at upper limits of normal for size. There is a small bore catheter overlying the left heart border similar to prior. IMPRESSION: Trace left pleural effusion. Electronically Signed   By: Camellia Candle M.D.   On: 12/01/2023 07:54   DG CHEST PORT 1 VIEW Result Date: 11/30/2023 CLINICAL DATA:  [id number] S/P pericardiocentesis [id number] EXAM: PORTABLE CHEST 1 VIEW COMPARISON:  11/30/2023, 5:42 a.m. FINDINGS: Bilateral lungs appear hyperlucent with coarse bronchovascular markings, in keeping with COPD. Bilateral lungs otherwise appear clear. No dense consolidation or lung collapse. Bilateral costophrenic angles are clear. Stable cardio-mediastinal silhouette. Since the prior study, there is new tubing overlying the left heart shadow, which may represent pericardial drainage catheter. Correlate with history. No pneumomediastinum noted. No acute osseous abnormalities. The soft tissues are within normal limits. IMPRESSION: No active disease. COPD. Electronically Signed   By: Ree Molt M.D.   On: 11/30/2023 15:59   ECHOCARDIOGRAM LIMITED Result Date: 11/30/2023    ECHOCARDIOGRAM LIMITED REPORT   Patient Name:   Tammy Wolfe Date of Exam: 11/30/2023 Medical Rec #:  [id number]    Height:       59.0 in Accession #:    [id number]   Weight:       80.0 lb Date of Birth:  1940/09/09     BSA:          1.249 m Patient Age:    83 years     BP:           141/71 mmHg Patient Gender: F            HR:           127 bpm. Exam Location:  Inpatient Procedure: Limited Echo (Both Spectral and Color Flow Doppler were utilized            during procedure). Indications:    I31.3 Pericardial effusion (noninflammatory)  History:         Patient has prior history of Echocardiogram examinations, most                 recent 11/28/2023. Abnormal ECG, COPD, Arrythmias:Tachycardia,                 Signs/Symptoms:Shortness of Breath and Dyspnea; Risk                 Factors:Current Smoker. Pericardial effusion. Early tamponade.                 H/O breast cancer. ETOH.  Sonographer:    Ellouise Mose RDCS Referring Phys: [phone] CHRISTOPHER END  Sonographer Comments: Image acquisition challenging due to mastectomy. Pericardiocentesis procedure. Patient with extremely thin habitus. IMPRESSIONS  1. Left ventricular ejection fraction, by estimation, is 60 to 65%. The left ventricle has normal function.  2. Moderate pericardial effusion slightly smaller than effusion seen on TTE 11/28/23 no evidence of tamponade . Moderate pericardial effusion. FINDINGS  Left Ventricle: Left ventricular ejection fraction, by estimation, is 60 to 65%. The left ventricle has normal function. The left ventricular internal cavity size was normal in size. Pericardium: Moderate pericardial effusion slightly smaller than effusion seen on TTE 11/28/23 no evidence of tamponade. A moderately sized pericardial effusion is present. Maude Emmer MD Electronically signed by Maude Emmer MD Signature Date/Time: 11/30/2023/11:17:08 AM    Final  CARDIAC CATHETERIZATION Result Date: 11/30/2023 Conclusions: Moderate-sized pericardial effusion by preprocedure bedside echocardiogram. Successful pericardiocentesis and pericardial drain placement from a subxiphoid approach, yielding 225 mL of serous (straw-colored) fluid. Recommendations: Maintain drain to negative pressure with attached negative pressure drain.  Record output every 4 hours. Perform chest radiograph to exclude pneumothorax and confirm placement. Repeat limited echocardiogram tomorrow morning to assess for reaccumulation of pericardial effusion.  If no significant reaccumulation and drain output less than 100 mL in 24 hours, drain can be  removed. Lonni Hanson, MD [facility]    Medications:     Current Medications:  arformoterol   15 mcg Nebulization BID   azithromycin   500 mg Oral Daily   budesonide  (PULMICORT ) nebulizer solution  0.25 mg Nebulization BID   Chlorhexidine  Gluconate Cloth  6 each Topical Daily   diltiazem   10 mg Intravenous Once   feeding supplement  237 mL Oral TID BM   furosemide   40 mg Intravenous Daily   guaiFENesin   600 mg Oral BID   heparin  injection (subcutaneous)  5,000 Units Subcutaneous Q8H   metoprolol  tartrate  12.5 mg Oral BID   multivitamin with minerals  1 tablet Oral Daily   nystatin   5 mL Oral QID   revefenacin   175 mcg Nebulization Daily   sodium chloride  flush  3 mL Intravenous Q12H    Infusions:  cefTRIAXone  (ROCEPHIN )  IV Stopped (11/30/23 2036)      Patient Profile   83 y.o. female with history of COPD, tobacco use, breast cancer s/p double mastectomy, CKD IIIb, hx ETOH abuse. Admitted with acute respiratory failure, b/l pleural effusions, pericardial effusion and concern for mediastinal mass on imaging.  Assessment/Plan   Pericardial effusion - S/p pericardiocentesis 07/07. Cytology pending. Concern for malignant effusion - 60 cc output from drain since placement. If has minimal output overnight, can likely remove drain tomorrow - Echo today with resolution of pericardial effusion  2. Atrial fibrillation  -New diagnosis -~ 3 hrs AF overnight 07/07-08, converted to SR spontaneously. Currently ST. -Now on po metoprolol  tartrate 12.5 mg BID -Suspect 2/2 acute illness -No anticoagulation currently -TSH okay -Echo 12/01/23: EF 70-75%, hyperdynamic LV function, severe asymmetric LVH of basal septal segment, RV hyperdynamic, mild holosystolic prolapse of both mitral valve leaflets  3. Bilateral pleural effusions -S/p R thoracentesis on 07/06, exudate. Cytology pending.  4. Mediastinal mass -Pleural and pericardial fluid cytology pending -Concern for advanced  stage malignancy  5. Acute respiratory failure COPD -Per CCM  6. AKI on CKD IIIb -Scr peaked at 1.6, improved to 1.3 today   Length of Stay: 4  FINCH, LINDSAY N, PA-C  12/01/2023, 8:36 AM  Advanced Heart Failure Team Pager [phone] (M-F; 7a - 5p)  Please contact [facility] for night-coverage after hours (4p -7a ) and weekends on [email or web address]   Patient seen and examined with the above-signed Advanced Practice Provider and/or Housestaff. I personally reviewed laboratory data, imaging studies and relevant notes. I independently examined the patient and formulated the important aspects of the plan. I have edited the note to reflect any of my changes or salient points. I have personally discussed the plan with the patient and/or family.  83 y/o smoker with COPD, breast cancer. Admitted with respiratory failure.   Found to have large mediastinal mass with pleural and pericardial effusions.   Underwent pericardiocentesis yesterday. Cytology pending.   Developed transient AF overnight now back in SR  Drain with about 60cc out overnight.   Echo this am EF 60-65% no effusion.  General:  thin chronically ill appearing No resp difficulty HEENT: normal Neck: supple. no JVD. Carotids 2+ bilat; no bruits. No lymphadenopathy or thryomegaly appreciated. Cor:  Regular rate & rhythm. No rubs, gallops or murmurs. Drain in place Lungs: markedly decreased BS Abdomen: soft, nontender, nondistended. No hepatosplenomegaly. No bruits or masses. Good bowel sounds. Extremities: no cyanosis, clubbing, rash, edema Neuro: alert & orientedx3, cranial nerves grossly intact. moves all 4 extremities w/o difficulty. Affect pleasant  Clinically stable after pericardial tap. Await cytology. Suspect malignant effusion.  Will keep drain in today. If output < 50/cc overnight then will pull.   Will not treat AF at this point. If recurs will need to consider [facility] +/- AAD  D/w CCM at bedisde  Toribio Fuel,  MD  3:54 PM

## 2023-12-02 DIAGNOSIS — J9601 Acute respiratory failure with hypoxia: Secondary | ICD-10-CM | POA: Diagnosis not present

## 2023-12-02 DIAGNOSIS — I3139 Other pericardial effusion (noninflammatory): Secondary | ICD-10-CM | POA: Diagnosis not present

## 2023-12-02 DIAGNOSIS — N1832 Chronic kidney disease, stage 3b: Secondary | ICD-10-CM | POA: Diagnosis not present

## 2023-12-02 DIAGNOSIS — J90 Pleural effusion, not elsewhere classified: Secondary | ICD-10-CM | POA: Diagnosis not present

## 2023-12-02 DIAGNOSIS — J432 Centrilobular emphysema: Secondary | ICD-10-CM | POA: Diagnosis not present

## 2023-12-02 LAB — BASIC METABOLIC PANEL WITH GFR
Anion gap: 10 (ref 5–15)
BUN: 80 mg/dL — ABNORMAL HIGH (ref 8–23)
CO2: 21 mmol/L — ABNORMAL LOW (ref 22–32)
Calcium: 9.3 mg/dL (ref 8.9–10.3)
Chloride: 102 mmol/L (ref 98–111)
Creatinine, Ser: 1.45 mg/dL — ABNORMAL HIGH (ref 0.44–1.00)
GFR, Estimated: 36 mL/min — ABNORMAL LOW (ref >60)
Glucose, Bld: 115 mg/dL — ABNORMAL HIGH (ref 70–99)
Potassium: 4.6 mmol/L (ref 3.5–5.1)
Sodium: 133 mmol/L — ABNORMAL LOW (ref 135–145)

## 2023-12-02 LAB — CBC
HCT: 35.2 % — ABNORMAL LOW (ref 36.0–46.0)
Hemoglobin: 11 g/dL — ABNORMAL LOW (ref 12.0–15.0)
MCH: 26.5 pg (ref 26.0–34.0)
MCHC: 31.3 g/dL (ref 30.0–36.0)
MCV: 84.8 fL (ref 80.0–100.0)
Platelets: 395 K/uL (ref 150–400)
RBC: 4.15 MIL/uL (ref 3.87–5.11)
RDW: 15.9 % — ABNORMAL HIGH (ref 11.5–15.5)
WBC: 11.3 K/uL — ABNORMAL HIGH (ref 4.0–10.5)
nRBC: 0 % (ref 0.0–0.2)

## 2023-12-02 LAB — CYTOLOGY - NON PAP

## 2023-12-02 MED ORDER — METOPROLOL TARTRATE 5 MG/5ML IV SOLN
2.5000 mg | Freq: Once | INTRAVENOUS | Status: AC
  Administered 2023-12-02: 2.5 mg via INTRAVENOUS
  Filled 2023-12-02: qty 5

## 2023-12-02 MED ORDER — METOPROLOL TARTRATE 12.5 MG HALF TABLET: 12.5000 mg | ORAL_TABLET | Freq: Two times a day (BID) | ORAL | Status: DC

## 2023-12-02 MED ORDER — POLYETHYLENE GLYCOL 3350 17 G PO PACK
17.0000 g | PACK | Freq: Every day | ORAL | Status: DC
  Administered 2023-12-02 – 2023-12-13 (×12): 17 g via ORAL
  Filled 2023-12-02 (×13): qty 1

## 2023-12-02 MED ORDER — ALBUMIN HUMAN 5 % IV SOLN
12.5000 g | Freq: Once | INTRAVENOUS | Status: AC
  Administered 2023-12-02: 12.5 g via INTRAVENOUS
  Filled 2023-12-02: qty 250

## 2023-12-02 MED ORDER — SENNOSIDES-DOCUSATE SODIUM 8.6-50 MG PO TABS
2.0000 | ORAL_TABLET | Freq: Every day | ORAL | Status: DC
  Administered 2023-12-02 – 2023-12-07 (×6): 2 via ORAL
  Filled 2023-12-02 (×6): qty 2

## 2023-12-02 NOTE — Progress Notes (Addendum)
 NAME:  Tammy Wolfe, MRN:  [id number], DOB:  04-13-1941, LOS: 5 ADMISSION DATE:  11/27/2023, CONSULTATION DATE:  12/02/23 REFERRING MD:  TRH, CHIEF COMPLAINT:  DOE/SOB   History of Present Illness:  83 year old severe emphysema presents to the ED at the direction of PCP with abnormal CT findings.  Diagnosis COPD Per PCP notes.  I see no pulmonary function test to confirm or validate this diagnosis.  Recently treated with exacerbation per PCP note.  On Trelegy at baseline.  CTA PE protocol was obtained.  No PE seen but did demonstrate bilateral moderate pleural effusions as well as pericardial effusion and concern for mediastinal mass.  Severe emphysematous changes noted.  The pericardial effusion and pleural effusions prompted recommendation to go to the ED.  She is tachycardic.  Normotensive.  On room air satting high 90s per initial flowsheet data..  She was placed on nasal cannula shortly thereafter.  Sats in the high 90s.  She endorses shortness of breath.  Dyspnea exertion. Feels better with O2.  Endorses 10 pound weight loss over the last 1 month.  Relatively sudden onset of breathing decompensation over the last few weeks.  Pertinent  Medical History  Breast cancer Emphysema HTN HLD H/o ETOH abuse, ETOH hepatitis and pancreatitis  Significant Hospital Events: Including procedures, antibiotic start and stop dates in addition to other pertinent events   7/4 admission, thora on R 7/7 pericardial drain, into afib w/ RVR overnight 7/8 lopressor  and amio gtt, PAF, pericardial drain 60ml  Interim History / Subjective:  SBP 70-90's overnight Back in Afib overnight States feels better today, only  mild soreness in chest when moves   Objective    Blood pressure (!) 76/56, pulse 76, temperature 97.7 F (36.5 C), temperature source Oral, resp. rate 13, height 4' 11 (1.499 m), weight 34.2 kg, SpO2 92%.    FiO2 (%):  [28 %] 28 %   Intake/Output Summary (Last 24 hours) at 12/02/2023  0714 Last data filed at 12/02/2023 0700 Gross per 24 hour  Intake 463.59 ml  Output 450 ml  Net 13.59 ml   Filed Weights   11/27/23 1418 12/01/23 2000 12/02/23 0600  Weight: 36.3 kg 35 kg 34.2 kg    Examination: General:  chronically ill appearing cachectic female sitting in bed eating breakfast in NAD HEENT: MM pink/dry, slight JVD Neuro: Alert, oriented x 3, MAE CV: irir, afib, rates 100-110s, pericardial drain PULM:  mild conversational dyspnea but non labored, clear except few scattered on right, reports cough is sometimes productive described as vanilla GI: soft, bs+, NT, purwick Extremities: warm/dry, no LE edema  Skin: no rashes   Afebrile Labs> Na 133, K 4.6, BUN/sCr 78/1.29> 80/1.45, WBC 12.6> 11.3, H/H stable, plts 428> 395  UOP 500 ml/ 24hrs plus 3 unmeasured  WT 35> 34.2kg  Resolved problem list   Assessment and Plan   Pericardial effusion s/p pericardial drain 7/7 Bilateral pleural effusions; exudative on the R s/p thora 6/7 Soft tissue mass in mediastinum - pleural and pericardial fluid cytology pending, remains high concern for an advanced stage malignancy - pericardial drain per cardiology, no drainage overnight, suspect will be d/c'd today  Right upper lobe linear density/nodule: Unclear if related to mediastinal process or not. -anticipate biopsy will not be required, pending cytology   Acute respiratory failure with hypoxia  Bilateral pleural effusions s/p R thora 7/6 Severe emphysema, no acute exacerbation - cont to wean supplemental O2 for sat goal > 90% - con't triple inhaled therapy -  ongoing aggressive pulm hygiene - pending rate of pleural fluid reaccumulation and cytology, may need pleurx at some point.  No growth, cytology pending - CXR prn - completed 4 days of azithro/ ctx 7/8, monitor clinically off abx  Paroxsymal fib with RVR - TSH 0.634 - back in afib overnight, rates 100-110  - hold metoprolol  for now with SBPs 70-90's but  improved this am - looks slightly dry, stop lasix .  albumin  bolus and IV lopressor  2.5 if BP will tolerate for now - remains on Amio 30, hold on bolus or increasing till discussed with AHF - optimize electrolytes  - SCDS only for now w/ pericardial drain, no heparin  for now with pericardial drain   Frailty, severe protein energy malnutrition, cachexia - cont to maximize nutrition, mobility ambulate - appreciate RD recs- pt eager to keep up nutrition  CKD 3b - uop down, sCr slightly up> albumin  today, stop lasix   - strict I/Os, trend renal indices -renally dose meds, avoid nephrotoxic meds  Chronic anemia, likely due to chronic disease - H/H stable  - transfuse for Hb<7 or hemodynamically significant bleeding  Best Practice (right click and Reselect all SmartList Selections daily)   Diet/type: Regular consistency (see orders) DVT prophylaxis SCD Pressure ulcer(s): N/A GI prophylaxis: N/A Lines: N/A Foley:  N/A Code Status:  full code Last date of multidisciplinary goals of care discussion [prior]  Pt updated on plan of care, no family at bedside.  Labs   CBC: Recent Labs  Lab 11/27/23 1430 11/28/23 0229 11/30/23 0338 12/01/23 0412 12/02/23 0349  WBC 15.8* 12.5* 9.7 12.6* 11.3*  NEUTROABS 14.3* 10.7* 8.1*  --   --   HGB 11.5* 11.5* 10.7* 11.3* 11.0*  HCT 36.8 35.8* 34.4* 36.6 35.2*  MCV 84.8 83.3 83.5 83.0 84.8  PLT 544* 535* 482* 428* 395    Basic Metabolic Panel: Recent Labs  Lab 11/27/23 1430 11/28/23 0229 11/30/23 0338 11/30/23 2230 12/01/23 0412 12/02/23 0349  NA 133* 133* 134*  --  137 133*  K 5.4* 5.0 4.0 4.2 4.4 4.6  CL 102 104 102  --  107 102  CO2 22 19* 21*  --  21* 21*  GLUCOSE 119* 89 115*  --  108* 115*  BUN 78* 85* 85*  --  78* 80*  CREATININE 1.51* 1.61* 1.45*  --  1.29* 1.45*  CALCIUM 9.9 9.3 8.9  --  9.6 9.3  MG  --  3.2*  --  2.9* 2.7*  --    GFR: Estimated Creatinine Clearance: 15.9 mL/min (A) (by C-G formula based on SCr of  1.45 mg/dL (H)). Recent Labs  Lab 11/27/23 1742 11/28/23 0229 11/30/23 0338 12/01/23 0412 12/02/23 0349  PROCALCITON 0.14  --   --   --   --   WBC  --  12.5* 9.7 12.6* 11.3*    Liver Function Tests: Recent Labs  Lab 11/28/23 0229  AST 47*  ALT 108*  ALKPHOS 299*  BILITOT 0.6  PROT 7.5  ALBUMIN  3.1*    Critical care time: 35 mins       Lyle Pesa, MSN, AG-ACNP-BC [facility] 12/02/2023, 7:14 AM  See Amion for pager If no response to pager , please call [phone] until 7pm After 7:00 pm call Elink  [phone]

## 2023-12-02 NOTE — Progress Notes (Cosign Needed Addendum)
 Advanced Heart Failure Rounding Note  Cardiologist: None   Chief Complaint: Pericardial effusion  Subjective:    Went into atrial fibrillation with RVR yesterday afternoon. Received IV metoprolol  and started on amiodarone  gtt.   In SR overnight, but back in AF with RVR around 8 am. Rate improved after 2.5 mg IV metoprolol .  No output noted from pericardial drain.  Dyspnea somewhat improved. Working on improving caloric intake/nutrition.   Objective:   Weight Range: 34.2 kg Body mass index is 15.23 kg/m.   Vital Signs:   Temp:  [97.4 F (36.3 C)-97.9 F (36.6 C)] 97.4 F (36.3 C) (07/09 0945) Pulse Rate:  [40-128] 76 (07/09 0700) Resp:  [11-31] 13 (07/09 0700) BP: (70-113)/(46-77) 76/56 (07/09 0700) SpO2:  [80 %-98 %] 92 % (07/09 0700) FiO2 (%):  [28 %] 28 % (07/08 2033) Weight:  [34.2 kg-35 kg] 34.2 kg (07/09 0600) Last BM Date : (S) 11/28/23  Weight change: Filed Weights   11/27/23 1418 12/01/23 2000 12/02/23 0600  Weight: 36.3 kg 35 kg 34.2 kg    Intake/Output:   Intake/Output Summary (Last 24 hours) at 12/02/2023 1129 Last data filed at 12/02/2023 0700 Gross per 24 hour  Intake 708.59 ml  Output 200 ml  Net 508.59 ml      Physical Exam    General:  Cachectic elderly female Neck: JVP 7-8 Cor: Irregular rhythm, tachy. No rubs, gallops or murmurs. Lungs: Diminished Abdomen: Soft, nontender, nondistended.  Extremities: No edema Neuro: Alert & orientedx3. Affect pleasant   Telemetry   Afib 110s  Labs    CBC Recent Labs    11/30/23 0338 12/01/23 0412 12/02/23 0349  WBC 9.7 12.6* 11.3*  NEUTROABS 8.1*  --   --   HGB 10.7* 11.3* 11.0*  HCT 34.4* 36.6 35.2*  MCV 83.5 83.0 84.8  PLT 482* 428* 395   Basic Metabolic Panel Recent Labs    92/92/74 2230 12/01/23 0412 12/02/23 0349  NA  --  137 133*  K 4.2 4.4 4.6  CL  --  107 102  CO2  --  21* 21*  GLUCOSE  --  108* 115*  BUN  --  78* 80*  CREATININE  --  1.29* 1.45*  CALCIUM  --   9.6 9.3  MG 2.9* 2.7*  --    Liver Function Tests No results for input(s): AST, ALT, ALKPHOS, BILITOT, PROT, ALBUMIN  in the last 72 hours. No results for input(s): LIPASE, AMYLASE in the last 72 hours. Cardiac Enzymes No results for input(s): CKTOTAL, CKMB, CKMBINDEX, TROPONINI in the last 72 hours.  BNP: BNP (last 3 results) Recent Labs    11/27/23 1430  BNP 60.1    ProBNP (last 3 results) Recent Labs    11/23/23 1546  PROBNP 45.0     D-Dimer No results for input(s): DDIMER in the last 72 hours. Hemoglobin A1C No results for input(s): HGBA1C in the last 72 hours. Fasting Lipid Panel No results for input(s): CHOL, HDL, LDLCALC, TRIG, CHOLHDL, LDLDIRECT in the last 72 hours. Thyroid  Function Tests Recent Labs    11/30/23 2230  TSH 0.634    Other results:   Imaging    No results found.   Medications:     Scheduled Medications:  arformoterol   15 mcg Nebulization BID   budesonide  (PULMICORT ) nebulizer solution  0.25 mg Nebulization BID   Chlorhexidine  Gluconate Cloth  6 each Topical Daily   feeding supplement  237 mL Oral TID BM   furosemide   40  mg Intravenous Daily   guaiFENesin   600 mg Oral BID   heparin  injection (subcutaneous)  5,000 Units Subcutaneous Q8H   multivitamin with minerals  1 tablet Oral Daily   nystatin   5 mL Oral QID   polyethylene glycol  17 g Oral Daily   revefenacin   175 mcg Nebulization Daily   senna-docusate  2 tablet Oral QHS   sodium chloride  flush  3 mL Intravenous Q12H    Infusions:  albumin  human     amiodarone  30 mg/hr (12/02/23 0700)    PRN Medications: acetaminophen  **OR** acetaminophen , ALPRAZolam , hydrALAZINE , ipratropium-albuterol , methocarbamol  (ROBAXIN ) injection, ondansetron  **OR** ondansetron  (ZOFRAN ) IV, oxyCODONE , polyethylene glycol    Patient Profile   83 y.o. female with history of COPD, tobacco use, breast cancer s/p double mastectomy, CKD IIIb, hx ETOH abuse.  Admitted with acute respiratory failure, b/l pleural effusions, pericardial effusion and concern for mediastinal mass on imaging.  Assessment/Plan   Pericardial effusion - S/p pericardiocentesis 07/07. Cytology suspicious for malignancy - Echo 07/08 with resolution of pericardial effusion - No output from pericardial drain over the last 24 hrs. Will discuss timing of pulling drain with Dr. Cherrie. - Anticipate limited echo within 24 hrs of removing drain   2. Atrial fibrillation with RVR -New diagnosis. In setting of mediastinal mass and respiratory failure -In and out of Afib with RVR. Continues on amiodarone  gtt at 30/hr. Rate control improved after IV metoprolol  this am - PO metoprolol  stopped d/t soft BP -No anticoagulation currently d/t pericardial drain - will discuss timing of anticoagulation with Dr. Cherrie -TSH okay -Echo 12/01/23: EF 70-75%, hyperdynamic LV function, severe asymmetric LVH of basal septal segment, RV hyperdynamic, mild holosystolic prolapse of both mitral valve leaflets   3. Bilateral pleural effusions -S/p R thoracentesis on 07/06, exudate. Cytology pending.   4. Mediastinal mass -Pleural and pericardial fluid cytology pending -Concern for advanced stage malignancy   5. Acute respiratory failure COPD -Per CCM   6. AKI on CKD IIIb -Scr peaked at 1.6, up slightly 1.45 today    Length of Stay: 5  Tammy Wolfe N, PA-C  12/02/2023, 11:29 AM  Advanced Heart Failure Team Pager [phone] (M-F; 7a - 5p)  Please contact [facility] for night-coverage after hours (5p -7a ) and weekends on [email or web address]  Patient seen and examined with the above-signed Advanced Practice Provider and/or Housestaff. I personally reviewed laboratory data, imaging studies and relevant notes. I independently examined the patient and formulated the important aspects of the plan. I have edited the note to reflect any of my changes or salient points. I have personally discussed  the plan with the patient and/or family.  In/out of AF. Currently in. No drainage from pericardial drain.   Denies CP or SOB.   \ Cytology from pericardial fluid suspicion for malignancy  General:Cachetic chronically ill appearing . No resp difficulty HEENT: normal Neck: supple. no JVD. Carotids 2+ bilat; no bruits. No lymphadenopathy or thryomegaly appreciated. Cor: PMI nondisplaced. Irreg  + drain  Lungs: decreased throughout  Abdomen: soft, nontender, nondistended. No hepatosplenomegaly. No bruits or masses. Good bowel sounds. Extremities: no cyanosis, clubbing, rash, edema Neuro: alert & orientedx3, cranial nerves grossly intact. moves all 4 extremities w/o difficulty. Affect pleasant  No further drainage from pericardial tube. I pulled drain personally.   Continue IV amio for AF. Will not start [facility] currently with recent pericardial effusion.   Onc following for further care planning. 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   Can go  to floor from our perspective.   Toribio Fuel, MD  12:35 PM

## 2023-12-02 NOTE — Telephone Encounter (Signed)
 Has since been addressed and patient has been seen at ED

## 2023-12-02 NOTE — Plan of Care (Signed)
   Problem: Coping: Goal: Level of anxiety will decrease Outcome: Progressing   Problem: Pain Managment: Goal: General experience of comfort will improve and/or be controlled Outcome: Progressing   Problem: Safety: Goal: Ability to remain free from injury will improve Outcome: Progressing

## 2023-12-03 ENCOUNTER — Inpatient Hospital Stay (HOSPITAL_COMMUNITY)

## 2023-12-03 DIAGNOSIS — R64 Cachexia: Secondary | ICD-10-CM

## 2023-12-03 DIAGNOSIS — J9601 Acute respiratory failure with hypoxia: Secondary | ICD-10-CM | POA: Diagnosis not present

## 2023-12-03 DIAGNOSIS — L8990 Pressure ulcer of unspecified site, unspecified stage: Secondary | ICD-10-CM | POA: Insufficient documentation

## 2023-12-03 DIAGNOSIS — I3139 Other pericardial effusion (noninflammatory): Secondary | ICD-10-CM

## 2023-12-03 DIAGNOSIS — J432 Centrilobular emphysema: Secondary | ICD-10-CM | POA: Diagnosis not present

## 2023-12-03 DIAGNOSIS — N1832 Chronic kidney disease, stage 3b: Secondary | ICD-10-CM | POA: Diagnosis not present

## 2023-12-03 DIAGNOSIS — J90 Pleural effusion, not elsewhere classified: Secondary | ICD-10-CM | POA: Diagnosis not present

## 2023-12-03 LAB — BASIC METABOLIC PANEL WITH GFR
Anion gap: 10 (ref 5–15)
BUN: 90 mg/dL — ABNORMAL HIGH (ref 8–23)
CO2: 23 mmol/L (ref 22–32)
Calcium: 9.5 mg/dL (ref 8.9–10.3)
Chloride: 95 mmol/L — ABNORMAL LOW (ref 98–111)
Creatinine, Ser: 1.64 mg/dL — ABNORMAL HIGH (ref 0.44–1.00)
GFR, Estimated: 31 mL/min — ABNORMAL LOW (ref >60)
Glucose, Bld: 182 mg/dL — ABNORMAL HIGH (ref 70–99)
Potassium: 4.5 mmol/L (ref 3.5–5.1)
Sodium: 128 mmol/L — ABNORMAL LOW (ref 135–145)

## 2023-12-03 LAB — ECHOCARDIOGRAM LIMITED
Height: 59 in
S' Lateral: 1.8 cm
Weight: 1206.36 [oz_av]

## 2023-12-03 LAB — CBC
HCT: 35.7 % — ABNORMAL LOW (ref 36.0–46.0)
Hemoglobin: 11.3 g/dL — ABNORMAL LOW (ref 12.0–15.0)
MCH: 26.5 pg (ref 26.0–34.0)
MCHC: 31.7 g/dL (ref 30.0–36.0)
MCV: 83.6 fL (ref 80.0–100.0)
Platelets: 399 K/uL (ref 150–400)
RBC: 4.27 MIL/uL (ref 3.87–5.11)
RDW: 15.5 % (ref 11.5–15.5)
WBC: 10.7 K/uL — ABNORMAL HIGH (ref 4.0–10.5)
nRBC: 0 % (ref 0.0–0.2)

## 2023-12-03 LAB — MAGNESIUM: Magnesium: 2.6 mg/dL — ABNORMAL HIGH (ref 1.7–2.4)

## 2023-12-03 MED ORDER — BUDESONIDE 0.5 MG/2ML IN SUSP
0.5000 mg | Freq: Two times a day (BID) | RESPIRATORY_TRACT | Status: DC
  Administered 2023-12-03 – 2023-12-13 (×20): 0.5 mg via RESPIRATORY_TRACT
  Filled 2023-12-03 (×23): qty 2

## 2023-12-03 MED ORDER — SODIUM CHLORIDE 0.9 % IV BOLUS
500.0000 mL | Freq: Once | INTRAVENOUS | Status: AC
  Administered 2023-12-03: 500 mL via INTRAVENOUS

## 2023-12-03 MED ORDER — CARMEX CLASSIC LIP BALM EX OINT
1.0000 | TOPICAL_OINTMENT | CUTANEOUS | Status: DC | PRN
  Administered 2023-12-03: 1 via TOPICAL
  Filled 2023-12-03: qty 10

## 2023-12-03 MED ORDER — BISACODYL 10 MG RE SUPP
10.0000 mg | Freq: Once | RECTAL | Status: AC
  Administered 2023-12-03: 10 mg via RECTAL
  Filled 2023-12-03: qty 1

## 2023-12-03 NOTE — Plan of Care (Signed)
 Palliative:  Chart reviewed. Spoke to daughter Almarie briefly.  Plan for goals of care discussion 7/11 at 4 pm.  Tobey Jama Barnacle, DNP, [facility] Palliative Medicine Team Team Phone # [phone]  Pager # [phone]  NO CHARGE

## 2023-12-03 NOTE — Progress Notes (Addendum)
 Advanced Heart Failure Rounding Note  Cardiologist: None   Chief Complaint: Pericardial effusion  Subjective:    Remains in AF this am, rates better controlled 90s-100s on amiodarone  gtt at 30/hr.  Dyspnea continues to improve.    Objective:   Weight Range: 34.2 kg Body mass index is 15.23 kg/m.   Vital Signs:   Temp:  [97.6 F (36.4 C)-97.8 F (36.6 C)] 97.8 F (36.6 C) (07/10 0840) Pulse Rate:  [87-119] 87 (07/10 1223) Resp:  [9-23] 18 (07/10 1223) BP: (83-124)/(46-83) 104/46 (07/10 1200) SpO2:  [87 %-98 %] 94 % (07/10 1223) Last BM Date : 11/28/23  Weight change: Filed Weights   11/27/23 1418 12/01/23 2000 12/02/23 0600  Weight: 36.3 kg 35 kg 34.2 kg    Intake/Output:   Intake/Output Summary (Last 24 hours) at 12/03/2023 1244 Last data filed at 12/03/2023 1218 Gross per 24 hour  Intake 552.6 ml  Output 600 ml  Net -47.4 ml      Physical Exam    General:  Frail, cachectic elderly female Neck: no JVD Cor: Irregularly irregular rhythm. No rubs, gallops or murmurs. Lungs: diminished breath sounds Abdomen: soft, nontender, nondistended.  Extremities: no edema Neuro: alert & orientedx3. Affect pleasant   Telemetry   Afib 110s  Labs    CBC Recent Labs    12/02/23 0349 12/03/23 1111  WBC 11.3* 10.7*  HGB 11.0* 11.3*  HCT 35.2* 35.7*  MCV 84.8 83.6  PLT 395 399   Basic Metabolic Panel Recent Labs    92/92/74 2230 12/01/23 0412 12/02/23 0349  NA  --  137 133*  K 4.2 4.4 4.6  CL  --  107 102  CO2  --  21* 21*  GLUCOSE  --  108* 115*  BUN  --  78* 80*  CREATININE  --  1.29* 1.45*  CALCIUM  --  9.6 9.3  MG 2.9* 2.7*  --    Liver Function Tests No results for input(s): AST, ALT, ALKPHOS, BILITOT, PROT, ALBUMIN  in the last 72 hours. No results for input(s): LIPASE, AMYLASE in the last 72 hours. Cardiac Enzymes No results for input(s): CKTOTAL, CKMB, CKMBINDEX, TROPONINI in the last 72 hours.  BNP: BNP  (last 3 results) Recent Labs    11/27/23 1430  BNP 60.1    ProBNP (last 3 results) Recent Labs    11/23/23 1546  PROBNP 45.0     D-Dimer No results for input(s): DDIMER in the last 72 hours. Hemoglobin A1C No results for input(s): HGBA1C in the last 72 hours. Fasting Lipid Panel No results for input(s): CHOL, HDL, LDLCALC, TRIG, CHOLHDL, LDLDIRECT in the last 72 hours. Thyroid  Function Tests Recent Labs    11/30/23 2230  TSH 0.634    Other results:   Imaging    No results found.   Medications:     Scheduled Medications:  arformoterol   15 mcg Nebulization BID   budesonide  (PULMICORT ) nebulizer solution  0.5 mg Nebulization BID   Chlorhexidine  Gluconate Cloth  6 each Topical Daily   feeding supplement  237 mL Oral TID BM   guaiFENesin   600 mg Oral BID   heparin  injection (subcutaneous)  5,000 Units Subcutaneous Q8H   multivitamin with minerals  1 tablet Oral Daily   nystatin   5 mL Oral QID   polyethylene glycol  17 g Oral Daily   revefenacin   175 mcg Nebulization Daily   senna-docusate  2 tablet Oral QHS   sodium chloride  flush  3 mL Intravenous  Q12H    Infusions:  amiodarone  30 mg/hr (12/03/23 1218)    PRN Medications: acetaminophen  **OR** acetaminophen , ALPRAZolam , hydrALAZINE , ipratropium-albuterol , methocarbamol  (ROBAXIN ) injection, ondansetron  **OR** ondansetron  (ZOFRAN ) IV, oxyCODONE , polyethylene glycol    Patient Profile   83 y.o. female with history of COPD, tobacco use, breast cancer s/p double mastectomy, CKD IIIb, hx ETOH abuse. Admitted with acute respiratory failure, b/l pleural effusions, pericardial effusion and concern for mediastinal mass on imaging.  Assessment/Plan   Pericardial effusion - S/p pericardiocentesis 07/07. Cytology suspicious for malignancy but further testing could not be performed d/t scant material - Echo 07/08 with resolution of pericardial effusion - Pericardial drain removed 07/09 - Repeat  limited echo to ensure no re-accumulation   2. Atrial fibrillation with RVR -New diagnosis. In setting of mediastinal mass and respiratory failure -Remains in AF, rate better controlled today on amiodarone  gtt at 30/hr. Would continue IV amiodarone  load today.  -No anticoagulation for now d/t recent pericardial drain. Would need to weigh risk/benefit of anticoagulation in the future. She understands the risk of stroke associated with chemical conversion. -TSH okay -Echo 12/01/23: EF 70-75%, hyperdynamic LV function, severe asymmetric LVH of basal septal segment, RV hyperdynamic, mild holosystolic prolapse of both mitral valve leaflets   3. Bilateral pleural effusions -S/p R thoracentesis on 07/06, exudate.    4. Metastatic cancer w/ mediastinal mass -Pericardial fluid malignant but further testing could not be performed as above -Seen by Heme/Onc. Not a candidate for systemic chemotherapy. Palliative Care has been consulted to discuss goals of care.    5. Acute respiratory failure COPD -Per CCM   Advanced Heart Failure will sign off once she is out of the ICU. Okay to transfer to progressive from HF standpoint.   Cardiology to follow patient on the floor.  She will not need Advanced Heart Failure follow-up.  Length of Stay: 6  FINCH, LINDSAY N, PA-C  12/03/2023, 12:44 PM  Advanced Heart Failure Team Pager [phone] (M-F; 7a - 5p)  Please contact [facility] for night-coverage after hours (5p -7a ) and weekends on [email or web address]  Patient seen and examined with the above-signed Advanced Practice Provider and/or Housestaff. I personally reviewed laboratory data, imaging studies and relevant notes. I independently examined the patient and formulated the important aspects of the plan. I have edited the note to reflect any of my changes or salient points. I have personally discussed the plan with the patient and/or family.  Pericardial drain pulled yesterday.Fluid cytology suspicious for  malignancy (Stage IV)   Remains in AF - rate controlled on IV amio.    General:  Elderly cachetic No resp difficulty HEENT: normal Neck: supple. no JVD. Carotids 2+ bilat; no bruits. No lymphadenopathy or thryomegaly appreciated. Cor: Irreg Lungs: decreased throughout Abdomen: soft, nontender, nondistended. No hepatosplenomegaly. No bruits or masses. Good bowel sounds. Extremities: no cyanosis, clubbing, rash, edema Neuro: alert & orientedx3, cranial nerves grossly intact. moves all 4 extremities w/o difficulty. Affect pleasant  Pericardial fluid c/w malignancy -> stage IV CA> Being seen by Oncology and Palliative Care  Remains in AF. Will continue amio for rate control. Would not use AC with recent pericardial effusion   Repeat limited echo to reassess effusion.   Can got to floor from cardiac standpoint.   Toribio Fuel, MD  4:45 PM

## 2023-12-03 NOTE — Progress Notes (Addendum)
 NAME:  Tammy Wolfe, MRN:  [id number], DOB:  1940-11-02, LOS: 6 ADMISSION DATE:  11/27/2023, CONSULTATION DATE:  12/03/23 REFERRING MD:  TRH, CHIEF COMPLAINT:  DOE/SOB   History of Present Illness:  83 year old severe emphysema presents to the ED at the direction of PCP with abnormal CT findings.  Diagnosis COPD Per PCP notes.  I see no pulmonary function test to confirm or validate this diagnosis.  Recently treated with exacerbation per PCP note.  On Trelegy at baseline.  CTA PE protocol was obtained.  No PE seen but did demonstrate bilateral moderate pleural effusions as well as pericardial effusion and concern for mediastinal mass.  Severe emphysematous changes noted.  The pericardial effusion and pleural effusions prompted recommendation to go to the ED.  She is tachycardic.  Normotensive.  On room air satting high 90s per initial flowsheet data..  She was placed on nasal cannula shortly thereafter.  Sats in the high 90s.  She endorses shortness of breath.  Dyspnea exertion. Feels better with O2.  Endorses 10 pound weight loss over the last 1 month.  Relatively sudden onset of breathing decompensation over the last few weeks.  Pertinent  Medical History  Breast cancer Emphysema HTN HLD H/o ETOH abuse, ETOH hepatitis and pancreatitis  Significant Hospital Events: Including procedures, antibiotic start and stop dates in addition to other pertinent events   7/4 admission, thora on R 7/7 pericardial drain, into afib w/ RVR overnight 7/8 lopressor  and amio gtt, PAF, pericardial drain 60ml output, diureses 7/9 pericardial drain removed  Interim History / Subjective:  Remains in Afib, mostly rates 90-115s Has soreness to left lower chest area but denies pain or SOB.  Feels like some chest congestion, but can't clear it afebrile  Objective    Blood pressure 102/71, pulse (!) 118, temperature 97.8 F (36.6 C), temperature source Oral, resp. rate 19, height 4' 11 (1.499 m), weight 34.2 kg,  SpO2 95%.        Intake/Output Summary (Last 24 hours) at 12/03/2023 1140 Last data filed at 12/03/2023 1101 Gross per 24 hour  Intake 676.5 ml  Output 750 ml  Net -73.5 ml   Filed Weights   11/27/23 1418 12/01/23 2000 12/02/23 0600  Weight: 36.3 kg 35 kg 34.2 kg    Examination: General:  Pleasant cachetic elderly female sitting in bed in NAD HEENT: MM pink/dry Neuro: Alert, oriented, appropriate, MAE CV: irir, afib, no murmur PULM:  non labored, less conversational dyspnea, clear, diminished in bases, 2-3L at 92-94% GI: soft, bs+, NT, purwick> no BM yet Extremities: warm/dry, no LE edema  Skin: no rashes  Afebrile Labs and CXR penging UOP 750 ml/ 24hrs    Resolved problem list   Assessment and Plan   Pericardial effusion s/p pericardial drain 7/7 Bilateral pleural effusions; exudative on the R s/p thora 6/7 Mediastinum mass Hx of breast cancer - pleural cytology pending.  Pericardial fluid cytology suspicious for malignancy P: - appreciate cardiology input - pericardial drain removed 7/9, plans for repeat limited echo - stable for transfer out of ICU today - oncology following peripherally pending cytology   - ongoing GOC's> pt agreeable to PMT consult.  Would like to remain full code for now, relays need more time, still processing information.     Right upper lobe linear density/nodule: Unclear if related to mediastinal process or not. - awaiting pleural fluid cytology fornow   Acute respiratory failure with hypoxia  Bilateral pleural effusions s/p R thora 7/6 Severe emphysema, no acute  exacerbation - cont to wean supplemental O2 for sat goal > 90% - con't triple inhaled therapy - add flutter/ IS, cont mucinex  - ongoing aggressive pulm hygiene - CXR today to evaluate for pleural fluid reaccumulation  - completed 4 days of azithro/ ctx 7/8, monitor clinically off abx> remains afebrile, no change in cough color/ amount   Paroxsymal fib with RVR - TSH  0.634 - per cardiology  - remains in Afib, some PAF 7/8-9 - holding metoprolol > BP did not tolerate yesterday - plans for additional amio IV at 30mg /hr for 24hr then likely transition to PO - defer timing of starting heparin  gtt to cardiology - optimize electrolytes    Frailty, severe protein energy malnutrition, cachexia - cont to maximize nutrition, mobility/ ambulate - appreciate RD recs   CKD 3b - pending BMET.  Hold any further lasix , appears on dry side - strict I/Os, trend renal indices -renally dose meds, avoid nephrotoxic meds Addendum> BMET reviewed> na 133> 122, Cl 95, BUN/ sCr up w/ soft BP> NS x 1  Chronic anemia, likely due to chronic disease - pending CBC - transfuse for Hb<7 or hemodynamically significant bleeding   Anxiety - cont low dose pta prn xanax    Best Practice (right click and Reselect all SmartList Selections daily)   Diet/type: Regular consistency (see orders) DVT prophylaxis SCD; heparin  SQ Pressure ulcer(s): N/A GI prophylaxis: N/A Lines: N/A Foley:  N/A Code Status:  full code Last date of multidisciplinary goals of care discussion [prior]  Pt and daughter updated on plan of care 7/10  Will transfer to telemetry and to [facility] as of 7/11.  Labs   CBC: Recent Labs  Lab 11/27/23 1430 11/28/23 0229 11/30/23 0338 12/01/23 0412 12/02/23 0349  WBC 15.8* 12.5* 9.7 12.6* 11.3*  NEUTROABS 14.3* 10.7* 8.1*  --   --   HGB 11.5* 11.5* 10.7* 11.3* 11.0*  HCT 36.8 35.8* 34.4* 36.6 35.2*  MCV 84.8 83.3 83.5 83.0 84.8  PLT 544* 535* 482* 428* 395    Basic Metabolic Panel: Recent Labs  Lab 11/27/23 1430 11/28/23 0229 11/30/23 0338 11/30/23 2230 12/01/23 0412 12/02/23 0349  NA 133* 133* 134*  --  137 133*  K 5.4* 5.0 4.0 4.2 4.4 4.6  CL 102 104 102  --  107 102  CO2 22 19* 21*  --  21* 21*  GLUCOSE 119* 89 115*  --  108* 115*  BUN 78* 85* 85*  --  78* 80*  CREATININE 1.51* 1.61* 1.45*  --  1.29* 1.45*  CALCIUM 9.9 9.3 8.9   --  9.6 9.3  MG  --  3.2*  --  2.9* 2.7*  --    GFR: Estimated Creatinine Clearance: 15.9 mL/min (A) (by C-G formula based on SCr of 1.45 mg/dL (H)). Recent Labs  Lab 11/27/23 1742 11/28/23 0229 11/30/23 0338 12/01/23 0412 12/02/23 0349  PROCALCITON 0.14  --   --   --   --   WBC  --  12.5* 9.7 12.6* 11.3*    Liver Function Tests: Recent Labs  Lab 11/28/23 0229  AST 47*  ALT 108*  ALKPHOS 299*  BILITOT 0.6  PROT 7.5  ALBUMIN  3.1*    Critical care time: 35 mins       Lyle Pesa, MSN, AG-ACNP-BC [facility] 12/03/2023, 11:40 AM  See Amion for pager If no response to pager , please call [phone] until 7pm After 7:00 pm call Elink  [phone]

## 2023-12-04 DIAGNOSIS — R64 Cachexia: Secondary | ICD-10-CM

## 2023-12-04 DIAGNOSIS — Z515 Encounter for palliative care: Secondary | ICD-10-CM

## 2023-12-04 DIAGNOSIS — J9859 Other diseases of mediastinum, not elsewhere classified: Secondary | ICD-10-CM

## 2023-12-04 DIAGNOSIS — Z7189 Other specified counseling: Secondary | ICD-10-CM

## 2023-12-04 DIAGNOSIS — I3139 Other pericardial effusion (noninflammatory): Secondary | ICD-10-CM | POA: Diagnosis not present

## 2023-12-04 DIAGNOSIS — J90 Pleural effusion, not elsewhere classified: Secondary | ICD-10-CM | POA: Diagnosis not present

## 2023-12-04 DIAGNOSIS — J9601 Acute respiratory failure with hypoxia: Secondary | ICD-10-CM | POA: Diagnosis not present

## 2023-12-04 LAB — CYTOLOGY - NON PAP

## 2023-12-04 LAB — CBC
HCT: 38.1 % (ref 36.0–46.0)
Hemoglobin: 11.7 g/dL — ABNORMAL LOW (ref 12.0–15.0)
MCH: 26.1 pg (ref 26.0–34.0)
MCHC: 30.7 g/dL (ref 30.0–36.0)
MCV: 85 fL (ref 80.0–100.0)
Platelets: 356 K/uL (ref 150–400)
RBC: 4.48 MIL/uL (ref 3.87–5.11)
RDW: 15.6 % — ABNORMAL HIGH (ref 11.5–15.5)
WBC: 9.6 K/uL (ref 4.0–10.5)
nRBC: 0 % (ref 0.0–0.2)

## 2023-12-04 LAB — BASIC METABOLIC PANEL WITH GFR
Anion gap: 13 (ref 5–15)
BUN: 85 mg/dL — ABNORMAL HIGH (ref 8–23)
CO2: 22 mmol/L (ref 22–32)
Calcium: 9.7 mg/dL (ref 8.9–10.3)
Chloride: 93 mmol/L — ABNORMAL LOW (ref 98–111)
Creatinine, Ser: 1.58 mg/dL — ABNORMAL HIGH (ref 0.44–1.00)
GFR, Estimated: 32 mL/min — ABNORMAL LOW (ref >60)
Glucose, Bld: 107 mg/dL — ABNORMAL HIGH (ref 70–99)
Potassium: 4.9 mmol/L (ref 3.5–5.1)
Sodium: 128 mmol/L — ABNORMAL LOW (ref 135–145)

## 2023-12-04 LAB — MAGNESIUM: Magnesium: 2.9 mg/dL — ABNORMAL HIGH (ref 1.7–2.4)

## 2023-12-04 MED ORDER — AMIODARONE HCL 200 MG PO TABS
200.0000 mg | ORAL_TABLET | Freq: Two times a day (BID) | ORAL | Status: DC
  Administered 2023-12-04 – 2023-12-13 (×19): 200 mg via ORAL
  Filled 2023-12-04 (×20): qty 1

## 2023-12-04 NOTE — Hospital Course (Signed)
 PMH of breast cancer, COPD, HTN, HLD, history of alcohol  abuse and pancreatitis presented to the hospital secondary to abnormal CT findings.  Has shortness of breath for 3 weeks.  Underwent CT scan of the chest outpatient and was found to have pericardial effusion as well as possible lung mass with pleural effusion. Admitted to the ICU.  Cardiology was consulted. 7/4 underwent thoracentesis on right side. 7/7 underwent pericardial drain placement.  Developed A-fib with RVR. 7/9 pericardial drain removed.  Oncology was consulted as the cytology came back positive for pericardial fluid for malignancy. 7/11 transfer to hospitalist service. 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 As above underwent thoracentesis as well as pericardiocentesis. Pericardial fluid came back positive for malignancy although tissue was not enough to send for identification or further testing. Pleural effusion cytology is still pending but effusion is exudative. Oncology was consulted. 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  Not a candidate for systemic chemotherapy. Hospice and palliative care recommended by oncology which I agree as well. Palliative care is consulted.  Will monitor discussion. Pericardial drain is now removed. Plan is for conservative management from cardiology.  Repeat echocardiogram shows no reoccurrence of pleural effusion.  New onset atrial fibrillation with RVR. Currently remains in sinus rhythm. On amiodarone . Not a candidate for anticoagulation. Amiodarone  now switched to oral. Follow-up echocardiogram recommended.  Acute respiratory failure with hypoxia. History of COPD/severe emphysema Secondary to effusions. Treated with Lasix . Oxygenation improving but still requiring oxygen. Repeat chest x-ray appears to be reassuring with regards to both fluid.   Treated with IV antibiotic.  Continue triple inhaler therapy. Monitor.  Underweight. Severe protein calorie malnutrition. Body mass index is 16.65 kg/m.  Placing the patient at a high risk for poor outcome. Likely in the setting of malignancy. Continue supplementation.  AKI. Hyponatremia. Azotemia Received diuresis.  Serum creatinine improved to 1.3 and worsened again to 1.6. Avoid nephrotoxic medication. Sodium level has been on the lower side.  Will monitor clinically. BUN is above 70s. Monitor as well. Not a candidate for IV hydration.

## 2023-12-04 NOTE — Progress Notes (Addendum)
 Progress Note  Patient Name: Tammy Wolfe Date of Encounter: 12/04/2023 [facility] Cardiologist: None   Interval Summary    Sitting up in bed, no complaints this morning. Converted to SR yesterday around 12pm. Continues to be short of breath.   Vital Signs Vitals:   12/04/23 0434 12/04/23 0500 12/04/23 0713 12/04/23 0852  BP: 105/79  (!) 97/55   Pulse: 74  73   Resp: 11 11 14    Temp: 98 F (36.7 C)  97.8 F (36.6 C) 97.8 F (36.6 C)  TempSrc: Oral  Oral   SpO2: 96%  100%   Weight:      Height:        Intake/Output Summary (Last 24 hours) at 12/04/2023 1015 Last data filed at 12/04/2023 0830 Gross per 24 hour  Intake 274.46 ml  Output 350 ml  Net -75.54 ml      12/03/2023    6:42 PM 12/02/2023    6:00 AM 12/01/2023    8:00 PM  Last 3 Weights  Weight (lbs) 82 lb 7.2 oz 75 lb 6.4 oz 77 lb 2.6 oz  Weight (kg) 37.4 kg 34.2 kg 35 kg      Telemetry/ECG  Sinus Rhythm rates 70s - Personally Reviewed  Physical Exam  GEN: Very frail, cachectic older female Neck: No JVD Cardiac: RRR, no murmurs, rubs, or gallops.  Respiratory: Diminished breath sounds GI: Soft, nontender, non-distended  MS: No edema  Assessment & Plan   83 y.o. female with history of COPD, tobacco use, breast cancer s/p double mastectomy, CKD IIIb, hx ETOH abuse. Admitted with acute respiratory failure, b/l pleural effusions, pericardial effusion and concern for mediastinal mass on imaging.   Pericardial effusion -- s/p pericardiocentesis 07/07, w/cytology suspicious for malignancy but further testing could not be performed d/t scant material -- repeat Echo 07/08 with resolution of pericardial effusion -- Pericardial drain removed 07/09 -- limited echo 7/10, LVEF 70-75% with no effusion  New onset Atrial fibrillation with RVR -- in the setting of mediastinal mass and respiratory failure -- No anticoagulation for now d/t recent pericardial drain. Would need to weigh risk/benefit of  anticoagulation in the future. Patient understands the risk of conversion.  -- TSH WNL -- converted to sinus rhythm around 12pm yesterday afternoon -- will transition IV amiodarone  to PO 200mg  BID   Bilateral pleural effusions -- S/p R thoracentesis on 07/06, exudate.    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 Not a candidate for systemic chemotherapy. Palliative Care has been consulted to discuss goals of care with meeting at 4pm today   Acute respiratory failure COPD Severe protein malnutrition Hyponatremia AKI   For questions or updates, please contact [facility] Please consult [email or web address] for contact info under       Signed, Manuelita Rummer, NP   Patient seen, examined. Available data reviewed. Agree with findings, assessment, and plan as outlined by Manuelita Rummer, NP. Pt is independently interviewed and examined. Frail appearing, very nice elderly woman. JVP normal, lungs CTA, heart RRR no murmur, abd: soft, NT, ext: no edema. Maintaining sinus rhythm on amiodarone . Poor candidate for oral anticoagulation with likely malignant pericardial effusion I would not recommend OAC. Continue amiodarone  orally as above with 200 mg BID x 2 weeks, then 200 mg daily. Follow-up echo shows no residual effusion. Will arrange OP cardiology follow-up.   Will sign off today. Would DC on  amiodarone  200 mg BID through 12/18/23, then 200 mg once daily thereafter.   Ozell Fell, M.D. 12/04/2023 2:00 PM

## 2023-12-04 NOTE — Progress Notes (Signed)
 [facility] Progress Note Patient: Tammy Wolfe FMW:[id number] DOB: 1941-01-23 DOA: 11/27/2023  DOS: the patient was seen and examined on 12/04/2023  Brief Hospital Course: PMH of breast cancer, COPD, HTN, HLD, history of alcohol  abuse and pancreatitis presented to the hospital secondary to abnormal CT findings.  Has shortness of breath for 3 weeks.  Underwent CT scan of the chest outpatient and was found to have pericardial effusion as well as possible lung mass with pleural effusion. Admitted to the ICU.  Cardiology was consulted. 7/4 underwent thoracentesis on right side. 7/7 underwent pericardial drain placement.  Developed A-fib with RVR. 7/9 pericardial drain removed.  Oncology was consulted as the cytology came back positive for pericardial fluid for malignancy. 7/11 transfer to hospitalist service. 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 As above underwent thoracentesis as well as pericardiocentesis. Pericardial fluid came back positive for malignancy although tissue was not enough to send for identification or further testing. Pleural effusion cytology is still pending but effusion is exudative. Oncology was consulted. 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  Not a candidate for systemic chemotherapy. Hospice and palliative care recommended by oncology which I agree as well. Palliative care is consulted.  Will monitor discussion. Pericardial drain is now removed. Plan is for conservative management from cardiology.  Repeat echocardiogram shows no reoccurrence of pleural effusion.  New onset atrial fibrillation with RVR. Currently remains in sinus rhythm. On amiodarone . Not a candidate for anticoagulation. Amiodarone  now switched to oral. Follow-up echocardiogram recommended.  Acute respiratory failure with hypoxia. History of COPD/severe  emphysema Secondary to effusions. Treated with Lasix . Oxygenation improving but still requiring oxygen. Repeat chest x-ray appears to be reassuring with regards to both fluid.  Treated with IV antibiotic.  Continue triple inhaler therapy. Monitor.  Underweight. Severe protein calorie malnutrition. Body mass index is 16.65 kg/m.  Placing the patient at a high risk for poor outcome. Likely in the setting of malignancy. Continue supplementation.  AKI. Hyponatremia. Azotemia Received diuresis.  Serum creatinine improved to 1.3 and worsened again to 1.6. Avoid nephrotoxic medication. Sodium level has been on the lower side.  Will monitor clinically. BUN is above 70s. Monitor as well. Not a candidate for IV hydration.   Subjective: No nausea or vomiting.  Ongoing cough.  Breathing better.  No fever no chills.  No chest pain.  Physical Exam: General: in Mild distress, No Rash Cardiovascular: S1 and S2 Present, No Murmur Respiratory: Good respiratory effort, Bilateral Air entry present.  Basal crackles, No wheezes Abdomen: Bowel Sound present, No tenderness Extremities: No edema Neuro: Alert and oriented x3, no new focal deficit  Data Reviewed: I have Reviewed nursing notes, Vitals, and Lab results. Since last encounter, pertinent lab results CBC and BMP   . I have ordered test including CBC and BMP  .   Disposition: Status is: Inpatient Remains inpatient appropriate because: Monitor for improvement in respiratory function and further clarity and goal of care.  heparin  injection 5,000 Units Start: 11/27/23 1745   Family Communication: No one at bedside Level of care: Progressive   Vitals:   12/04/23 0500 12/04/23 0713 12/04/23 0852 12/04/23 1102  BP:  (!) 97/55  (!) 109/59  Pulse:  73  76  Resp: 11 14  13   Temp:  97.8 F (36.6 C) 97.8 F (36.6 C) 98.1 F (36.7 C)  TempSrc:  Oral  Axillary  SpO2:  100%  99%  Weight:      Height:         Author: Yetta Blanch,  MD 12/04/2023 6:48 PM  Please look on [email or web address] to find out who is on call.

## 2023-12-04 NOTE — Consult Note (Signed)
 Consultation Note Date: 12/04/2023   Patient Name: Tammy Wolfe  DOB: 06-Jan-1941  MRN: [id number]  Age / Sex: 83 y.o., female  PCP: [name] ORN, MD Referring Physician: Tobie Yetta HERO, MD  Reason for Consultation: Establishing goals of care  HPI/Patient Profile: 83 y.o. female  with past medical history of breast cancer, emphysema, HTN, HLD, H/o ETOH abuse, ETOH hepatitis and pancreatitis admitted on 11/27/2023 with at the direction of PCP with abnormal CT findings. CT with bilateral moderate pleural effusions as well as pericardial effusion and concern for mediastinal mass. 7/4 underwent R thoracentesis. 7/7 had pericardial drain placed and was removed 7/9. Also with a fib RVR during admission. Pericardial fluid consistent with malignancy. PMT consulted to discuss GOC.   Clinical Assessment and Goals of Care: I have reviewed medical records including EPIC notes, labs and imaging, received report from [place], Charity fundraiser, assessed the patient and then met with patient, 2 daughters, 2 grandchildren  to discuss diagnosis prognosis, GOC, EOL wishes, disposition and options.  I introduced Palliative Medicine as specialized medical care for people living with serious illness. It focuses on providing relief from the symptoms and stress of a serious illness. The goal is to improve quality of life for both the patient and the family.  Patient and family share about what a shock the current situation is. At baseline, patient lived alone and was fully independent. She was active, exercised daily. She tells me over the past month she developed sudden weight loss and shortness of breath leading her to seek medical care. She tells me she maintained her appetite but her shortness of breath limited her intake.    We discussed patient's current illness and what it means in the larger context of patient's on-going co-morbidities. We discussed her metastatic cancer - unclear  primary. She understands this. We discussed that oncology does not think she would be a good candidate for chemo - she also understands this.   We discuss importance of quality of life.  We discuss that there are several decisions to think through - she shares at this point she is overwhelmed - she is comfortable talking through the decisions and their options but does not want to make any decisions at this time.   We first discussed cancer treatment - discussed the option to see oncology and talk through any options she may have, she is aware Dr Odean has said she is not candidate for systemic chemo. We also discussed option to focus solely on comfort, quality of life, avoid aggressive medical interventions.   We discussed role of hospice, philosophy of care and when having their support would make sense.   She may consider rehab - either facility or home health depending on PT recommendations - and then transition to hospice once rehab is complete.   Encouraged patient to consider DNR/DNI status understanding evidenced based poor outcomes in similar hospitalized patients, as the cause of the arrest is likely associated with chronic/terminal disease rather than a reversible acute cardio-pulmonary event. She understands and will consider. Says DNR/DNI makes sense to her but wants time to consider.   Discussed with patient and family the importance of continued conversation with family and the medical providers regarding overall plan of care and treatment options, ensuring decisions are within the context of the patient's values and GOCs.    Questions and concerns were addressed. The family was encouraged to call with questions or concerns.    Primary Decision Maker PATIENT    SUMMARY OF  RECOMMENDATIONS   - patient requesting time to consider all of the above: cancer treatment, hospice, code status - PMT provider will f/u tomorrow 7/12 to discuss again after she has time to consider - have  added PT/OT at her request     Primary Diagnoses: Present on Admission:  Pericardial effusion   I have reviewed the medical record, interviewed the patient and family, and examined the patient. The following aspects are pertinent.  Past Medical History:  Diagnosis Date   ABNORMAL THYROID  FUNCTION TESTS 12/10/2009   ALCOHOLIC HEPATITIS, HX OF 02/14/2007   ALLERGIC RHINITIS 02/14/2007   Anemia, unspecified 04/26/2012   ANXIETY 03/19/2007   Breast cancer (HCC)    Breast implant removal status 12/23/2008   COLONIC POLYPS, HX OF 02/14/2007   COPD (chronic obstructive pulmonary disease) (HCC)    Coronary artery calcification seen on CT scan 10/31/2013   GOITER, MULTINODULAR 02/14/2007   HX, PERSONAL, ALCOHOLISM 02/14/2007   HYPERLIPIDEMIA 02/14/2007   HYPERTENSION 02/14/2007   Insomnia    OSTEOARTHRITIS 02/14/2007   OSTEOPOROSIS 02/14/2007   PANCREATITIS, HX OF 02/14/2007   Smoker 09/06/2013   Social History   Socioeconomic History   Marital status: Divorced    Spouse name: Not on file   Number of children: 3   Years of education: Not on file   Highest education level: Not on file  Occupational History   Occupation: retired Visual merchandiser  Tobacco Use   Smoking status: Former    Current packs/day: 0.00    Types: Cigarettes    Quit date: 01/24/2021    Years since quitting: 2.8   Smokeless tobacco: Never   Tobacco comments:    smoking about 4 cigs/day (as of 04/10/16)-gwd  Vaping Use   Vaping status: Never Used  Substance and Sexual Activity   Alcohol  use: Not Currently    Alcohol /week: 0.0 standard drinks of alcohol     Comment: one glass/wine occasional/ no drinking now   Drug use: No   Sexual activity: Not Currently  Other Topics Concern   Not on file  Social History Narrative   Not on file   Social Drivers of Health   Financial Resource Strain: Low Risk  (04/21/2023)   Overall Financial Resource Strain (CARDIA)    Difficulty of Paying Living Expenses: Not hard at  all  Food Insecurity: No Food Insecurity (11/27/2023)   Hunger Vital Sign    Worried About Running Out of Food in the Last Year: Never true    Ran Out of Food in the Last Year: Never true  Transportation Needs: No Transportation Needs (11/27/2023)   PRAPARE - Administrator, Civil Service (Medical): No    Lack of Transportation (Non-Medical): No  Physical Activity: Inactive (04/21/2023)   Exercise Vital Sign    Days of Exercise per Week: 0 days    Minutes of Exercise per Session: 0 min  Stress: No Stress Concern Present (04/21/2023)   [employer] of Occupational Health - Occupational Stress Questionnaire    Feeling of Stress : Not at all  Social Connections: Socially Isolated (11/29/2023)   Social Connection and Isolation Panel    Frequency of Communication with Friends and Family: Three times a week    Frequency of Social Gatherings with Friends and Family: Three times a week    Attends Religious Services: Never    Active Member of Clubs or Organizations: No    Attends Banker Meetings: Never    Marital Status: Divorced   Family History  Adopted: Yes  Problem Relation Age of Onset   Hyperlipidemia Brother    Scheduled Meds:  amiodarone   200 mg Oral BID   arformoterol   15 mcg Nebulization BID   budesonide  (PULMICORT ) nebulizer solution  0.5 mg Nebulization BID   Chlorhexidine  Gluconate Cloth  6 each Topical Daily   feeding supplement  237 mL Oral TID BM   guaiFENesin   600 mg Oral BID   heparin  injection (subcutaneous)  5,000 Units Subcutaneous Q8H   multivitamin with minerals  1 tablet Oral Daily   nystatin   5 mL Oral QID   polyethylene glycol  17 g Oral Daily   revefenacin   175 mcg Nebulization Daily   senna-docusate  2 tablet Oral QHS   sodium chloride  flush  3 mL Intravenous Q12H   Continuous Infusions: PRN Meds:.acetaminophen  **OR** acetaminophen , ALPRAZolam , hydrALAZINE , ipratropium-albuterol , lip balm, methocarbamol  (ROBAXIN ) injection,  ondansetron  **OR** ondansetron  (ZOFRAN ) IV, oxyCODONE , polyethylene glycol Allergies  Allergen Reactions   Tetracycline Anaphylaxis   Lovastatin  Other (See Comments)    Muscle cramps   Pravastatin  Other (See Comments)    Leg cramps   Review of Systems  Constitutional:  Positive for unexpected weight change.  Respiratory:  Positive for shortness of breath.     Physical Exam Constitutional:      General: She is not in acute distress.    Appearance: She is ill-appearing.     Comments: Frail, thin  Pulmonary:     Effort: Pulmonary effort is normal.  Skin:    General: Skin is warm and dry.  Neurological:     Mental Status: She is alert and oriented to person, place, and time.     Vital Signs: BP (!) 109/59 (BP Location: Left Arm)   Pulse 76   Temp 98.1 F (36.7 C) (Axillary)   Resp 13   Ht 4' 11 (1.499 m)   Wt 37.4 kg   SpO2 99%   BMI 16.65 kg/m  Pain Scale: 0-10   Pain Score: 5    SpO2: SpO2: 99 % O2 Device:SpO2: 99 % O2 Flow Rate: .O2 Flow Rate (L/min): 3 L/min  IO: Intake/output summary:  Intake/Output Summary (Last 24 hours) at 12/04/2023 1527 Last data filed at 12/04/2023 1300 Gross per 24 hour  Intake 120 ml  Output 350 ml  Net -230 ml    LBM: Last BM Date : 11/30/23 Baseline Weight: Weight: 36.3 kg Most recent weight: Weight: 37.4 kg     Palliative Assessment/Data: PPS 60%     *Please note that this is a verbal dictation therefore any spelling or grammatical errors are due to the Dragon Medical One system interpretation.   Time Total: 90 minutes Time spent includes: Detailed review of medical records (labs, imaging, vital signs), medically appropriate exam, discussion with treatment team, counseling and educating patient, family and/or staff, documenting clinical information, medication management and coordination of care.    Tobey Jama Barnacle, DNP, AGNP-C Palliative Medicine Team [phone] Pager: [phone]

## 2023-12-04 NOTE — Progress Notes (Signed)
 Nutrition Follow-up  DOCUMENTATION CODES:   Underweight, Severe malnutrition in context of chronic illness  INTERVENTION:  -Continue regular/liberalized diet as ordered to promote intake -Continue to encourage small frequent meals -Continue snacks TID -Continue Ensure Plus HP TID -Room service w/ assist -Continue MVI w/min   NUTRITION DIAGNOSIS:   Severe Malnutrition related to chronic illness as evidenced by severe fat depletion, severe muscle depletion, percent weight loss.  -Continue to be appropriate  GOAL:   Patient will meet greater than or equal to 90% of their needs  -Progressing  MONITOR:   PO intake, Supplement acceptance, Labs, Weight trends, Skin, I & O's  REASON FOR ASSESSMENT:   Consult Assessment of nutrition requirement/status, Diet education  ASSESSMENT:   83 yo female admitted with worsening shortness of breath, fatigue and weight loss with pleural effusions and pericardial effusions, also noted to have mediastinal mass concerning for malignancy on exam. PMH includes COPD, hx of breast cancer s/p bilateral mastectomy, hx tobacco and EtOH abuse, EtOH hepatitis, pancreatitis, HTN, HLD, CKD 3b.  Spoke to pt at bedside for f/u. Pt denies n/v/c/d or chewing/swallowing difficulties at this time. Last BM documented 7/7. Pt states appetite is still decreased, though, has been drinking her Ensure TID as she likes them. Requests for all Ensure to be Vanilla. Encouraged pt to continue to focus on adequate intake, praise given for consuming ONS TID. Documented meal intake has been 0-25% x 6 days, continues to be inadequate. Weight continues to vary, supposed gain since admission, though questions accuracy. Weight gain would be desirable overall as she continues to be below goal range, particularly for geriatric parameters. Pt denies additional questions/concerns at this time, will continue to monitor, RDN available prn.    Labs Na 128 Chloride 93 BG 107 BUN 85 Cr  1.58 Mg 2.9 ALK 299 Albumin  3.1 AST 47 ALT 108 GFR 32 Hemoglobin 11.7  Medications Xanax  Amiodarone  Dulcolax suppository Heparin  Hydralazine  MVI w/ minerals  Admit weight 36.3 kg Current weight 37.4 kg   Diet Order:   Diet Order             Diet regular Room service appropriate? Yes with Assist; Fluid consistency: Thin  Diet effective now                   EDUCATION NEEDS:   Education needs have been addressed  Skin:  Skin Assessment: Reviewed RN Assessment  Last BM:  7/4  Height:   Ht Readings from Last 1 Encounters:  12/03/23 4' 11 (1.499 m)    Weight:   Wt Readings from Last 1 Encounters:  12/03/23 37.4 kg     BMI:  Body mass index is 16.65 kg/m.  Estimated Nutritional Needs:   Kcal:  1250-1450 kcal  Protein:  55-75 g  Fluid:  >/=1500 ml    Blen Ransome Daml-Budig, RDN, LDN Registered Dietitian Nutritionist RD Inpatient Contact Info in [place]

## 2023-12-04 NOTE — Plan of Care (Signed)
   Problem: Education: Goal: Knowledge of General Education information will improve Description: Including pain rating scale, medication(s)/side effects and non-pharmacologic comfort measures Outcome: Progressing   Problem: Activity: Goal: Risk for activity intolerance will decrease Outcome: Not Progressing   Problem: Coping: Goal: Level of anxiety will decrease Outcome: Not Progressing

## 2023-12-04 NOTE — Plan of Care (Signed)
 Patient having anxiety and pain likely related to medical condition.  Family has been very supportive and asking appropriate questions.  Patient vitals remained stable.

## 2023-12-05 ENCOUNTER — Inpatient Hospital Stay (HOSPITAL_COMMUNITY)

## 2023-12-05 DIAGNOSIS — Z515 Encounter for palliative care: Secondary | ICD-10-CM | POA: Diagnosis not present

## 2023-12-05 DIAGNOSIS — I3139 Other pericardial effusion (noninflammatory): Secondary | ICD-10-CM | POA: Diagnosis not present

## 2023-12-05 DIAGNOSIS — Z7189 Other specified counseling: Secondary | ICD-10-CM | POA: Diagnosis not present

## 2023-12-05 LAB — CBC
HCT: 35 % — ABNORMAL LOW (ref 36.0–46.0)
Hemoglobin: 11 g/dL — ABNORMAL LOW (ref 12.0–15.0)
MCH: 26.4 pg (ref 26.0–34.0)
MCHC: 31.4 g/dL (ref 30.0–36.0)
MCV: 83.9 fL (ref 80.0–100.0)
Platelets: 362 K/uL (ref 150–400)
RBC: 4.17 MIL/uL (ref 3.87–5.11)
RDW: 15.4 % (ref 11.5–15.5)
WBC: 10.5 K/uL (ref 4.0–10.5)
nRBC: 0 % (ref 0.0–0.2)

## 2023-12-05 LAB — BASIC METABOLIC PANEL WITH GFR
Anion gap: 3 — ABNORMAL LOW (ref 5–15)
Anion gap: 10 (ref 5–15)
BUN: 95 mg/dL — ABNORMAL HIGH (ref 8–23)
BUN: 78 mg/dL — ABNORMAL HIGH (ref 8–23)
CO2: 32 mmol/L (ref 22–32)
CO2: 17 mmol/L — ABNORMAL LOW (ref 22–32)
Calcium: 10.2 mg/dL (ref 8.9–10.3)
Calcium: 7.7 mg/dL — ABNORMAL LOW (ref 8.9–10.3)
Chloride: 91 mmol/L — ABNORMAL LOW (ref 98–111)
Chloride: 101 mmol/L (ref 98–111)
Creatinine, Ser: 1.99 mg/dL — ABNORMAL HIGH (ref 0.44–1.00)
Creatinine, Ser: 1.46 mg/dL — ABNORMAL HIGH (ref 0.44–1.00)
GFR, Estimated: 24 mL/min — ABNORMAL LOW (ref >60)
GFR, Estimated: 35 mL/min — ABNORMAL LOW (ref >60)
Glucose, Bld: 116 mg/dL — ABNORMAL HIGH (ref 70–99)
Glucose, Bld: 143 mg/dL — ABNORMAL HIGH (ref 70–99)
Potassium: 4.8 mmol/L (ref 3.5–5.1)
Potassium: 3.7 mmol/L (ref 3.5–5.1)
Sodium: 126 mmol/L — ABNORMAL LOW (ref 135–145)
Sodium: 128 mmol/L — ABNORMAL LOW (ref 135–145)

## 2023-12-05 LAB — CULTURE, BODY FLUID W GRAM STAIN -BOTTLE: Culture: NO GROWTH

## 2023-12-05 LAB — MAGNESIUM: Magnesium: 3.2 mg/dL — ABNORMAL HIGH (ref 1.7–2.4)

## 2023-12-05 MED ORDER — SIMETHICONE 80 MG PO CHEW
80.0000 mg | CHEWABLE_TABLET | Freq: Four times a day (QID) | ORAL | Status: DC
  Administered 2023-12-05 – 2023-12-13 (×33): 80 mg via ORAL
  Filled 2023-12-05 (×34): qty 1

## 2023-12-05 MED ORDER — BISACODYL 10 MG RE SUPP
10.0000 mg | Freq: Once | RECTAL | Status: AC
  Administered 2023-12-05: 10 mg via RECTAL
  Filled 2023-12-05: qty 1

## 2023-12-05 MED ORDER — SODIUM CHLORIDE 0.9 % IV SOLN
INTRAVENOUS | Status: DC
  Administered 2023-12-06: 75 mL via INTRAVENOUS

## 2023-12-05 NOTE — Plan of Care (Signed)
   Problem: Education: Goal: Knowledge of General Education information will improve Description Including pain rating scale, medication(s)/side effects and non-pharmacologic comfort measures Outcome: Progressing   Problem: Clinical Measurements: Goal: Ability to maintain clinical measurements within normal limits will improve Outcome: Progressing   Problem: Clinical Measurements: Goal: Will remain free from infection Outcome: Progressing

## 2023-12-05 NOTE — Progress Notes (Signed)
 Daily Progress Note   Patient Name: Tammy Wolfe       Date: 12/05/2023 DOB: Mar 04, 1941  Age: 83 y.o. MRN#: [id number] Attending Physician: Tobie Yetta HERO, MD Primary Care Physician: [name] ORN, MD Admit Date: 11/27/2023  Reason for Consultation/Follow-up: Establishing goals of care  Length of Stay: 8  Current Medications: Scheduled Meds:   amiodarone   200 mg Oral BID   arformoterol   15 mcg Nebulization BID   budesonide  (PULMICORT ) nebulizer solution  0.5 mg Nebulization BID   Chlorhexidine  Gluconate Cloth  6 each Topical Daily   feeding supplement  237 mL Oral TID BM   guaiFENesin   600 mg Oral BID   heparin  injection (subcutaneous)  5,000 Units Subcutaneous Q8H   multivitamin with minerals  1 tablet Oral Daily   nystatin   5 mL Oral QID   polyethylene glycol  17 g Oral Daily   revefenacin   175 mcg Nebulization Daily   senna-docusate  2 tablet Oral QHS   sodium chloride  flush  3 mL Intravenous Q12H    Continuous Infusions:  sodium chloride       PRN Meds: acetaminophen  **OR** acetaminophen , ALPRAZolam , hydrALAZINE , ipratropium-albuterol , lip balm, methocarbamol  (ROBAXIN ) injection, ondansetron  **OR** ondansetron  (ZOFRAN ) IV, oxyCODONE , polyethylene glycol  Physical Exam Vitals reviewed.  Constitutional:      General: She is not in acute distress.    Appearance: She is cachectic. She is ill-appearing.  HENT:     Head: Normocephalic and atraumatic.  Cardiovascular:     Rate and Rhythm: Normal rate.  Pulmonary:     Effort: Pulmonary effort is normal.  Skin:    General: Skin is dry.  Neurological:     Mental Status: She is alert and oriented to person, place, and time.  Psychiatric:        Mood and Affect: Mood normal.        Behavior: Behavior normal.              Vital Signs: BP 113/61 (BP Location: Right Arm)   Pulse 88   Temp 98 F (36.7 C) (Oral)   Resp 15   Ht 4' 11 (1.499 m)   Wt 40.1 kg   SpO2 97%   BMI 17.86 kg/m  SpO2: SpO2: 97 % O2 Device: O2 Device: Nasal Cannula O2 Flow Rate: O2 Flow Rate (L/min):  3 L/min       Palliative Assessment/Data: 60%      Patient Active Problem List   Diagnosis Date Noted   Cachexia (HCC) 12/03/2023   Pressure injury of skin 12/03/2023   Protein-calorie malnutrition, severe 12/01/2023   Acute respiratory failure with hypoxia (HCC) 11/30/2023   Mediastinal mass 11/30/2023   Pleural effusion 11/30/2023   Pericardial effusion 11/27/2023   Pleural effusion, bilateral 11/27/2023   Recent unexplained weight loss 11/27/2023   Hoarse voice quality 11/27/2023   Cigarette smoker 11/27/2023   Chronic obstructive pulmonary disease with acute exacerbation (HCC) 11/27/2023   HX: breast cancer 11/27/2023   COPD exacerbation (HCC) 11/23/2023   Urinary frequency 11/11/2023   Tendonitis of right hand 11/13/2022   Skin lesion of chest wall 11/13/2022   B12 deficiency 11/10/2022   Peripheral neuropathy 05/07/2022   Anxiety 05/07/2022   CKD (chronic kidney disease) stage 3, GFR 30-59 ml/min (HCC) 11/04/2021   Nocturnal foot cramps 11/04/2021   Sinusitis 05/01/2021   Tachycardia 05/01/2021   Chronic left maxillary sinusitis 04/04/2021   Encounter for chronic pain management 04/25/2020   Chronic low back pain 03/12/2016   Recurrent UTI 10/10/2015   Dysuria 03/14/2015   COPD (chronic obstructive pulmonary disease) with chronic bronchitis (HCC) 03/07/2014   Coronary artery calcification seen on CT scan 10/31/2013   Hypokalemia 10/19/2013   Former smoker 09/06/2013   Breast cancer of upper-outer quadrant of left female breast (HCC) 03/22/2013   Chronic back pain 04/26/2012   Impaired glucose tolerance 04/26/2012   Anemia, unspecified 04/26/2012   Encounter for well adult exam with abnormal findings  11/13/2010   Vitamin D  deficiency 11/13/2010   ABNORMAL THYROID  FUNCTION TESTS 12/10/2009   Breast implant removal status 12/23/2008   Generalized anxiety disorder 03/19/2007   FATIGUE 03/19/2007   GOITER, MULTINODULAR 02/14/2007   Hyperlipidemia 02/14/2007   Essential hypertension 02/14/2007   Allergic rhinitis 02/14/2007   Osteoarthritis 02/14/2007   Osteoporosis 02/14/2007   HX, PERSONAL, ALCOHOLISM 02/14/2007   History of digestive system disease 02/14/2007   History of colonic polyps 02/14/2007   ALCOHOLIC HEPATITIS, HX OF 02/14/2007    Palliative Care Assessment & Plan   Patient Profile: 83 y.o. female  with past medical history of breast cancer, emphysema, HTN, HLD, H/o ETOH abuse, ETOH hepatitis and pancreatitis admitted on 11/27/2023 with at the direction of PCP with abnormal CT findings. CT with bilateral moderate pleural effusions as well as pericardial effusion and concern for mediastinal mass. 7/4 underwent R thoracentesis. 7/7 had pericardial drain placed and was removed 7/9. Also with a fib RVR during admission. Pericardial fluid consistent with malignancy. PMT consulted to discuss GOC.     Today's Discussion: Reviewed chart.  Patient sitting up in bed in NAD.  She reports she slept pretty well until they came to take labs this morning.  PMT met with the patient and her family yesterday afternoon to discuss goals of care and options moving forward.  The patient faces treatment option decisions, advanced directive decisions, and anticipatory care needs.  She admits this has been a lot to consider in a short time.  She has not had the opportunity to discuss further with her children and grandchildren.  She feels like she has all of the information she needs to make decisions regarding goals of care but just needs more time to process and consider.  We agree that PMT will follow-up on Sunday to further discuss goals of care.  Encouraged her to call PMT if  questions or concerns  arise. Hard Choices booklet left for review.  Recommendations/Plan: Full code Full scope Time to consider next steps including whether to pursue cancer therapy or hospice and code status PMT to follow up 7/13   Code Status:    Code Status Orders  (From admission, onward)           Start     Ordered   11/27/23 1637  Full code  Continuous       Question:  By:  Answer:  Consent: discussion documented in EHR   11/27/23 1638         Extensive chart review has been completed prior to seeing the patient including labs, vital signs, imaging, progress/consult notes, orders, medications, and available advance directive documents.  Care plan was discussed with Dr. Tobie  Time spent: 27 minutes  Thank you for allowing the Palliative Medicine Team to assist in the care of this patient.   Stephane CHRISTELLA Palin, NP  Please contact Palliative Medicine Team phone at [phone] for questions and concerns.

## 2023-12-05 NOTE — Progress Notes (Signed)
 PT Cancellation Note  Patient Details Name: Tammy Wolfe MRN: [id number] DOB: 1940-07-04   Cancelled Treatment:    Reason Eval/Treat Not Completed: Other (comment);Fatigue/lethargy limiting ability to participate (Earlier pt had bedrest orders; MD messaged and bed rest orders removed in late AM. In PM pt stated that she was fatigued from multiple tests and big decisions and would like to defer to tomorrow. Will follow up as able and appropriate.)  [name], DPT, CLT  Acute Rehabilitation Services Office: [phone] (Secure chat preferred)   [name] 12/05/2023, 3:04 PM

## 2023-12-05 NOTE — Evaluation (Signed)
 Occupational Therapy Evaluation Patient Details Name: Tammy Wolfe MRN: [id number] DOB: 1941-02-08 Today's Date: 12/05/2023   History of Present Illness   Pt is a 83 y.o female admitted 7/4 c/o of SOB. CT showed pericardial effusion & bil pleural effusions, concern for mediastinal mass. S/p peridcardiocentesis 7/7. Onset of afib & RVR 7/8. 7/9 pericardial drain removed. PMH: COPD, breast cancer s/p double mastectomy, HTN, arthritis     Clinical Impressions Pt admitted based on above, and was seen based on problem list below. PTA pt was independent with ADLs and IADLs. OT eval conducted at bed level d/t bed rest orders and concerns for self-feeding. Today pt is requiring set up  to max assist for bed level ADLs. OT provided and educated pt on use of incentive spirometer and flutter valve, pt returning demo. Discussed post-acute rehab with pt and potential rehab on hospice. Pt will discuss with family to determine if family can manage assistance and supervision with [facility] services. Pt reporting preference for [facility] but open to short term rehab. OT will continue to follow acutely to maximize functional independence.     If plan is discharge home, recommend the following:   A lot of help with walking and/or transfers;A lot of help with bathing/dressing/bathroom;Assistance with cooking/housework     Functional Status Assessment   Patient has had a recent decline in their functional status and demonstrates the ability to make significant improvements in function in a reasonable and predictable amount of time.     Equipment Recommendations   BSC/3in1;Wheelchair (measurements OT);Wheelchair cushion (measurements OT)     Recommendations for Other Services         Precautions/Restrictions   Precautions Precautions: Fall Recall of Precautions/Restrictions: Impaired Restrictions Weight Bearing Restrictions Per Provider Order: No     Mobility Bed Mobility     General bed mobility  comments: Not tested d/t cardiopulmonary status    Transfers   General transfer comment: Not tested d/t cardiopulmonary status          ADL either performed or assessed with clinical judgement   ADL Overall ADL's : Needs assistance/impaired Eating/Feeding: Set up;Bed level Eating/Feeding Details (indicate cue type and reason): Increased time and limited distractions d/t decreaseed attention Grooming: Wash/dry face;Bed level   Upper Body Bathing: Set up;Bed level   Lower Body Bathing: Moderate assistance;Bed level   Upper Body Dressing : Set up;Bed level   Lower Body Dressing: Moderate assistance;Bed level     General ADL Comments: Limited to bed level eval d/t cardiopulmonary status     Vision Baseline Vision/History: 0 No visual deficits Vision Assessment?: No apparent visual deficits            Pertinent Vitals/Pain Pain Assessment Pain Assessment: Faces Faces Pain Scale: Hurts little more Pain Location: Chest and throat Pain Descriptors / Indicators: Discomfort Pain Intervention(s): Limited activity within patient's tolerance     Extremity/Trunk Assessment Upper Extremity Assessment Upper Extremity Assessment: Generalized weakness   Lower Extremity Assessment Lower Extremity Assessment: Defer to PT evaluation   Cervical / Trunk Assessment Cervical / Trunk Assessment: Normal   Communication Communication Communication: No apparent difficulties   Cognition Arousal: Alert Behavior During Therapy: WFL for tasks assessed/performed Cognition: Cognition impaired     Awareness: Online awareness impaired Memory impairment (select all impairments): Short-term memory Attention impairment (select first level of impairment): Selective attention   OT - Cognition Comments: Decreased attention and STM, pt often repetivtive but aware of deficits and situation, overall WFL   Following commands: Intact  Cueing  General Comments   Cueing Techniques:  Verbal cues  Educated pt on use of flutter valve and incentive spirometer. Pt only pulling 500 mL           Home Living Family/patient expects to be discharged to:: Private residence Living Arrangements: Alone Available Help at Discharge: Family Type of Home: House Home Access: Stairs to enter Secretary/administrator of Steps: 7 Entrance Stairs-Rails: Can reach both Home Layout: Two level;Bed/bath upstairs Alternate Level Stairs-Number of Steps: flight Alternate Level Stairs-Rails: Right Bathroom Shower/Tub: Producer, television/film/video: Standard Bathroom Accessibility: Yes How Accessible: Accessible via walker Home Equipment: Shower seat - built in          Prior Functioning/Environment Prior Level of Function : Independent/Modified Independent;Driving     Mobility Comments: No AD      OT Problem List: Decreased activity tolerance;Impaired balance (sitting and/or standing);Decreased strength;Decreased knowledge of use of DME or AE;Cardiopulmonary status limiting activity   OT Treatment/Interventions: Self-care/ADL training;Therapeutic exercise;Energy conservation;DME and/or AE instruction;Therapeutic activities;Patient/family education;Balance training      OT Goals(Current goals can be found in the care plan section)   Acute Rehab OT Goals Patient Stated Goal: To let family decide post-acute care OT Goal Formulation: With patient Time For Goal Achievement: 12/19/23 Potential to Achieve Goals: Good   OT Frequency:  Min 2X/week       AM-PAC OT 6 Clicks Daily Activity     Outcome Measure Help from another person eating meals?: None Help from another person taking care of personal grooming?: A Little Help from another person toileting, which includes using toliet, bedpan, or urinal?: Total Help from another person bathing (including washing, rinsing, drying)?: A Lot Help from another person to put on and taking off regular upper body clothing?: A  Little Help from another person to put on and taking off regular lower body clothing?: A Lot 6 Click Score: 15   End of Session Equipment Utilized During Treatment: Oxygen Nurse Communication: Other (comment) (Cardiopulmonary status)  Activity Tolerance: Treatment limited secondary to medical complications (Comment) Patient left: in bed;with call bell/phone within reach  OT Visit Diagnosis: Unsteadiness on feet (R26.81);Other abnormalities of gait and mobility (R26.89);Muscle weakness (generalized) (M62.81)                Time: 9151-9083 OT Time Calculation (min): 28 min Charges:  OT General Charges $OT Visit: 1 Visit OT Evaluation $OT Eval Moderate Complexity: 1 Mod OT Treatments $Self Care/Home Management : 8-22 mins  Adrianne BROCKS, OT  Acute Rehabilitation Services Office [phone] Secure chat preferred   Adrianne GORMAN Savers 12/05/2023, 10:12 AM

## 2023-12-05 NOTE — Progress Notes (Signed)
 [facility] Progress Note Patient: Tammy Wolfe FMW:[id number] DOB: 09/03/1940 DOA: 11/27/2023  DOS: the patient was seen and examined on 12/05/2023  Brief Hospital Course: PMH of breast cancer, COPD, HTN, HLD, history of alcohol  abuse and pancreatitis presented to the hospital secondary to abnormal CT findings.  Has shortness of breath for 3 weeks.  Underwent CT scan of the chest outpatient and was found to have pericardial effusion as well as possible lung mass with pleural effusion. Admitted to the ICU.  Cardiology was consulted. 7/4 underwent thoracentesis on right side. 7/7 underwent pericardial drain placement.  Developed A-fib with RVR. 7/9 pericardial drain removed.  Oncology was consulted as the cytology came back positive for pericardial fluid for malignancy. 7/11 transfer to hospitalist service.  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 As above underwent thoracentesis as well as pericardiocentesis. Pericardial fluid came back positive for malignancy although tissue was not enough to send for identification or further testing. Pleural effusion cytology is still pending but effusion is exudative. Oncology was consulted. 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  Not a candidate for systemic chemotherapy. Hospice and palliative care recommended by oncology which I agree as well. Palliative care is consulted.  Will monitor discussion. Pericardial drain is now removed. Plan is for conservative management from cardiology.  Repeat echocardiogram shows no reoccurrence of pleural effusion.  New onset atrial fibrillation with RVR. Currently remains in sinus rhythm. On amiodarone . Not a candidate for anticoagulation. Amiodarone  now switched to oral. Follow-up echocardiogram recommended.  Acute respiratory failure with hypoxia. History of COPD/severe  emphysema Secondary to effusions. Treated with Lasix . Oxygenation improving but still requiring oxygen. Repeat chest x-ray appears to be reassuring with regards to both fluid.  Treated with IV antibiotic.  Continue triple inhaler therapy. Monitor.  Underweight. Severe protein calorie malnutrition. Body mass index is 16.65 kg/m.  Placing the patient at a high risk for poor outcome. Likely in the setting of malignancy. Continue supplementation.  AKI. Hyponatremia. Azotemia Received diuresis.  Serum creatinine improved to 1.3 and worsened again to 1.6. Avoid nephrotoxic medication. Sodium level has been on the lower side.  Will monitor clinically. BUN is above 70s. Due to worsening trend of creatinine as well as BUN and patient's desire for now to continue active treatment, will initiate IV fluid and monitor.  Goals of care conversation. Palliative care following. Prognosis is poor in the setting of stage IV malignancy with with poor performance status. Not a candidate for chemotherapy. Radiation will not change the overall outlook/prognosis. Family discussing amongst himself with regards to approach although currently leaning towards focusing on more quality of life.  Possible ileus. Gastric distention. Patient reported some abdominal discomfort as well as constipation with last BM prior to admission. X-ray abdomen was performed.  Which shows evidence of abdominal distention with some stool burden. Will initiate suppository. Initiate simethicone . If no benefit will order enema.   Subjective: Ongoing cough.  Shortness of breath still present.  No nausea or vomiting.  No chest pain.  No abdominal pain but no BM.  Passing gas.  Physical Exam: General: in Mild distress, No Rash, cachectic Cardiovascular: S1 and S2 Present, No Murmur Respiratory: Good respiratory effort, Bilateral Air entry present.  Basal crackles, No wheezes Abdomen: Bowel Sound present, No  tenderness Extremities: No edema Neuro: Alert and oriented x3, no new focal deficit  Data Reviewed: I have Reviewed nursing notes, Vitals, and  Lab results. Since last encounter, pertinent lab results CBC and BMP   . I have ordered test including CBC and BMP x-ray abdomen and x-ray chest  . I have discussed pt's care plan and test results with palliative care  .   Disposition: Status is: Inpatient Remains inpatient appropriate because: Monitor for improvement in clarity and goal of care as well as stability of pleural effusion.  heparin  injection 5,000 Units Start: 11/27/23 1745   Family Communication: Discussed with family at bedside Level of care: Progressive   Vitals:   12/05/23 0331 12/05/23 0551 12/05/23 0751 12/05/23 1632  BP: 113/63  113/61 112/62  Pulse:   88 90  Resp: 15  15 14   Temp: 98 F (36.7 C)  98 F (36.7 C) 97.6 F (36.4 C)  TempSrc: Oral  Oral Oral  SpO2: 94%  97% 90%  Weight:  40.1 kg    Height:         Author: Yetta Blanch, MD 12/05/2023 7:09 PM  Please look on [email or web address] to find out who is on call.

## 2023-12-06 ENCOUNTER — Inpatient Hospital Stay (HOSPITAL_COMMUNITY)

## 2023-12-06 DIAGNOSIS — I3139 Other pericardial effusion (noninflammatory): Secondary | ICD-10-CM | POA: Diagnosis not present

## 2023-12-06 LAB — BASIC METABOLIC PANEL WITH GFR
Anion gap: 9 (ref 5–15)
Anion gap: 9 (ref 5–15)
BUN: 93 mg/dL — ABNORMAL HIGH (ref 8–23)
BUN: 85 mg/dL — ABNORMAL HIGH (ref 8–23)
CO2: 24 mmol/L (ref 22–32)
CO2: 20 mmol/L — ABNORMAL LOW (ref 22–32)
Calcium: 10.1 mg/dL (ref 8.9–10.3)
Calcium: 10.3 mg/dL (ref 8.9–10.3)
Chloride: 95 mmol/L — ABNORMAL LOW (ref 98–111)
Chloride: 100 mmol/L (ref 98–111)
Creatinine, Ser: 1.71 mg/dL — ABNORMAL HIGH (ref 0.44–1.00)
Creatinine, Ser: 1.42 mg/dL — ABNORMAL HIGH (ref 0.44–1.00)
GFR, Estimated: 29 mL/min — ABNORMAL LOW (ref >60)
GFR, Estimated: 37 mL/min — ABNORMAL LOW (ref >60)
Glucose, Bld: 134 mg/dL — ABNORMAL HIGH (ref 70–99)
Glucose, Bld: 128 mg/dL — ABNORMAL HIGH (ref 70–99)
Potassium: 4.6 mmol/L (ref 3.5–5.1)
Potassium: 5.1 mmol/L (ref 3.5–5.1)
Sodium: 128 mmol/L — ABNORMAL LOW (ref 135–145)
Sodium: 129 mmol/L — ABNORMAL LOW (ref 135–145)

## 2023-12-06 LAB — MAGNESIUM: Magnesium: 3.4 mg/dL — ABNORMAL HIGH (ref 1.7–2.4)

## 2023-12-06 LAB — CBC
HCT: 33.2 % — ABNORMAL LOW (ref 36.0–46.0)
Hemoglobin: 10.5 g/dL — ABNORMAL LOW (ref 12.0–15.0)
MCH: 26.2 pg (ref 26.0–34.0)
MCHC: 31.6 g/dL (ref 30.0–36.0)
MCV: 82.8 fL (ref 80.0–100.0)
Platelets: 347 K/uL (ref 150–400)
RBC: 4.01 MIL/uL (ref 3.87–5.11)
RDW: 15.2 % (ref 11.5–15.5)
WBC: 12.1 K/uL — ABNORMAL HIGH (ref 4.0–10.5)
nRBC: 0 % (ref 0.0–0.2)

## 2023-12-06 NOTE — Progress Notes (Signed)
 [facility] night cross cover note:   I was notified by the patient's RN that the patient had no bowel movement following Dulcolax suppository this evening.  Most recent bowel movement is reported to have occurred on 12/04/2023.  I subsequently ordered soapsuds enema x 1 occurrence now.     Eva Pore, DO Hospitalist

## 2023-12-06 NOTE — Plan of Care (Signed)
  Problem: Clinical Measurements: Goal: Will remain free from infection Outcome: Progressing   Problem: Elimination: Goal: Will not experience complications related to urinary retention Outcome: Progressing   Problem: Safety: Goal: Ability to remain free from injury will improve Outcome: Progressing   

## 2023-12-06 NOTE — Evaluation (Signed)
 Physical Therapy Evaluation Patient Details Name: Tammy Wolfe MRN: [id number] DOB: 1940-11-01 Today's Date: 12/06/2023  History of Present Illness  Pt is a 83 y.o female admitted 7/4 c/o of SOB. CT showed pericardial effusion & bil pleural effusions, concern for mediastinal mass. S/p peridcardiocentesis 7/7. Onset of afib & RVR 7/8. 7/9 pericardial drain removed. PMH: COPD, breast cancer s/p double mastectomy, HTN, arthritis  Clinical Impression  Pt currently is below baseline level of functioning requiring Min A for bed mobility, sit to stand and transfers. Pt will most likely require 24/7 light physical assist on discharge from acute care hospital setting. Pt was unable to progress gait today due to incontinence and fear of falling. Due to pt current functional status, home set up and available assistance at home recommending skilled physical therapy services 3x/week in order to address strength, balance and functional mobility to decrease risk for falls, injury and re-hospitalization.         If plan is discharge home, recommend the following: Help with stairs or ramp for entrance;A little help with walking and/or transfers;Assistance with cooking/housework;Assist for transportation     Equipment Recommendations Rolling walker (2 wheels);BSC/3in1     Functional Status Assessment Patient has had a recent decline in their functional status and demonstrates the ability to make significant improvements in function in a reasonable and predictable amount of time.     Precautions / Restrictions Precautions Precautions: Fall Recall of Precautions/Restrictions: Impaired Restrictions Weight Bearing Restrictions Per Provider Order: No      Mobility  Bed Mobility Overal bed mobility: Needs Assistance Bed Mobility: Supine to Sit, Sit to Supine     Supine to sit: Min assist Sit to supine: Min assist   General bed mobility comments: Min A for trunk to mid line and MIn A for LE to bed     Transfers Overall transfer level: Needs assistance Equipment used: Rolling walker (2 wheels), None Transfers: Sit to/from Stand Sit to Stand: Min assist     General transfer comment: Min A for momentum to get to standing with verbal cues for hand placement.    Ambulation/Gait   Pre-gait activities: Pt took steps from EOB <>BSC Min A for balance and assist navigating AD. Pt was able to take side steps at EOB With Min A for balance while holding onto therapist. General Gait Details: pt deferred due to incontinence.     Balance Overall balance assessment: Needs assistance Sitting-balance support: Single extremity supported, Feet unsupported, Feet supported Sitting balance-Leahy Scale: Fair   Postural control: Posterior lean Standing balance support: Bilateral upper extremity supported, During functional activity, Reliant on assistive device for balance Standing balance-Leahy Scale: Poor Standing balance comment: reliant on external support due to posterior lean.       Pertinent Vitals/Pain Pain Assessment Pain Assessment: No/denies pain Pain Intervention(s): Monitored during session    Home Living Family/patient expects to be discharged to:: Private residence Living Arrangements: Alone Available Help at Discharge: Family Type of Home: Other(Comment) (condo) Home Access: Stairs to enter Entrance Stairs-Rails: Can reach both Entrance Stairs-Number of Steps: 7 Alternate Level Stairs-Number of Steps: flight with landing Home Layout: Two level;Bed/bath upstairs Home Equipment: Shower seat - built in      Prior Function Prior Level of Function : Independent/Modified Independent;Driving             Mobility Comments: No AD ADLs Comments: Mod I to ind with all ADLs and IADLs.     Extremity/Trunk Assessment   Upper Extremity Assessment Upper  Extremity Assessment: Generalized weakness;Defer to OT evaluation    Lower Extremity Assessment Lower Extremity  Assessment: Generalized weakness    Cervical / Trunk Assessment Cervical / Trunk Assessment: Normal  Communication   Communication Communication: Impaired Factors Affecting Communication: Hearing impaired;Other (comment) (hypophonic)    Cognition Arousal: Alert Behavior During Therapy: WFL for tasks assessed/performed   PT - Cognitive impairments: No apparent impairments     Following commands: Intact       Cueing Cueing Techniques: Verbal cues            Assessment/Plan    PT Assessment Patient needs continued PT services  PT Problem List Decreased strength;Decreased balance;Decreased mobility;Decreased activity tolerance       PT Treatment Interventions DME instruction;Therapeutic activities;Gait training;Therapeutic exercise;Stair training;Functional mobility training;Balance training;Patient/family education    PT Goals (Current goals can be found in the Care Plan section)  Acute Rehab PT Goals Patient Stated Goal: to return home. PT Goal Formulation: With patient/family Time For Goal Achievement: 12/20/23 Potential to Achieve Goals: Fair    Frequency Min 2X/week        AM-PAC PT 6 Clicks Mobility  Outcome Measure Help needed turning from your back to your side while in a flat bed without using bedrails?: A Little Help needed moving from lying on your back to sitting on the side of a flat bed without using bedrails?: A Little Help needed moving to and from a bed to a chair (including a wheelchair)?: A Little Help needed standing up from a chair using your arms (e.g., wheelchair or bedside chair)?: A Little Help needed to walk in hospital room?: A Lot Help needed climbing 3-5 steps with a railing? : Total 6 Click Score: 15    End of Session Equipment Utilized During Treatment: Gait belt Activity Tolerance: Patient tolerated treatment well;Patient limited by fatigue;Other (comment) (incontinence.) Patient left: in bed;with call bell/phone within  reach;with family/visitor present Nurse Communication: Mobility status PT Visit Diagnosis: Unsteadiness on feet (R26.81);Other abnormalities of gait and mobility (R26.89);Muscle weakness (generalized) (M62.81)    Time: 8974-8886 PT Time Calculation (min) (ACUTE ONLY): 48 min   Charges:   PT Evaluation $PT Eval Low Complexity: 1 Low PT Treatments $Therapeutic Activity: 23-37 mins PT General Charges $$ ACUTE PT VISIT: 1 Visit         [name], DPT, CLT  Acute Rehabilitation Services Office: [phone] (Secure chat preferred)   [name] 12/06/2023, 11:24 AM

## 2023-12-06 NOTE — Progress Notes (Signed)
 [facility] Progress Note Patient: Tammy Wolfe FMW:[id number] DOB: Jan 10, 1941 DOA: 11/27/2023  DOS: the patient was seen and examined on 12/06/2023  Brief Hospital Course: PMH of breast cancer, COPD, HTN, HLD, history of alcohol  abuse and pancreatitis presented to the hospital secondary to abnormal CT findings.  Has shortness of breath for 3 weeks.  Underwent CT scan of the chest outpatient and was found to have pericardial effusion as well as possible lung mass with pleural effusion. Admitted to the ICU.  Cardiology was consulted. 7/4 underwent thoracentesis on right side. 7/7 underwent pericardial drain placement.  Developed A-fib with RVR. 7/9 pericardial drain removed.  Oncology was consulted as the cytology came back positive for pericardial fluid for malignancy. 7/11 transfer to hospitalist service.  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 As above underwent thoracentesis as well as pericardiocentesis. Pericardial fluid came back positive for malignancy although tissue was not enough to send for identification or further testing. Pleural effusion cytology is still pending but effusion is exudative. Oncology was consulted. 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  Not a candidate for systemic chemotherapy. Hospice and palliative care recommended by oncology which I agree as well. Palliative care is consulted.  Will monitor discussion. Pericardial drain is now removed. Plan is for conservative management from cardiology.  Repeat echocardiogram shows no reoccurrence of pericardial effusion. Pleural effusion appears to be reoccurring.  New onset atrial fibrillation with RVR. Currently remains in sinus rhythm. On amiodarone . Not a candidate for anticoagulation. Amiodarone  now switched to oral. Follow-up echocardiogram recommended.  Acute respiratory  failure with hypoxia. History of COPD/severe emphysema Secondary to effusions. Treated with Lasix . Oxygenation improving but still requiring oxygen. Repeat chest x-ray appears to be reassuring with regards to both fluid.  Treated with IV antibiotic.  Continue triple inhaler therapy. Monitor.  Underweight. Severe protein calorie malnutrition. Body mass index is 17.86 kg/m.  Placing the patient at a high risk for poor outcome. Likely in the setting of malignancy. Continue supplementation.  AKI. Hyponatremia. Azotemia Received diuresis.  Serum creatinine improved to 1.3 and worsened again to 1.6. Avoid nephrotoxic medication. Sodium level has been on the lower side.  Will monitor clinically. BUN is above 70s. Due to worsening trend of creatinine as well as BUN and patient's desire for now to continue active treatment, continue IV fluid.  Goals of care conversation. Palliative care following. Prognosis is poor in the setting of stage IV malignancy with with poor performance status. Not a candidate for chemotherapy. Radiation will not change the overall outlook/prognosis. Family discussing amongst himself with regards to approach although currently leaning towards focusing on more quality of life. 7/13 discussed with patient at bedside.  Patient is alert awake and oriented x 3 able to make decisions and able to understand what the medical situation.  Wants to be switched to DNR/DNI.  Daughter was at bedside at the time of the discussion.  Possible ileus. Gastric distention. Patient reported some abdominal discomfort as well as constipation with last BM prior to admission. X-ray abdomen was performed.  Which shows evidence of abdominal distention with some stool burden. Continue enema.  Simethicone .  Repeat x-ray tomorrow.   Subjective: No nausea no vomiting.  No fever no chills.  Passing gas but no BM.  Physical Exam: General: in Mild distress, No Rash Cardiovascular: S1 and S2  Present, No Murmur Respiratory: Good respiratory effort, Bilateral Air entry present.  Basal crackles, No wheezes Abdomen: Bowel Sound present, No tenderness Extremities: No edema Neuro: Alert and oriented x3, no new focal deficit  Data Reviewed: I have Reviewed nursing notes, Vitals, and Lab results. Since last encounter, pertinent lab results CBC and BMP   . I have ordered test including CBC and BMP  . I have discussed pt's care plan and test results with palliative care  .   Disposition: Status is: Inpatient Remains inpatient appropriate because: Monitor for improvement in clarity and goal of care.  heparin  injection 5,000 Units Start: 11/27/23 1745   Family Communication: Daughter at bedside Level of care: Progressive   Vitals:   12/06/23 0005 12/06/23 0428 12/06/23 0740 12/06/23 1952  BP: 125/67 135/61 128/72 132/62  Pulse: 90 89 82 94  Resp: 14 14 13 16   Temp: 97.8 F (36.6 C) 98.8 F (37.1 C) 98.4 F (36.9 C) (!) 97.4 F (36.3 C)  TempSrc: Oral Oral Oral Oral  SpO2: 98% 94% 96% 94%  Weight:      Height:         Author: Yetta Blanch, MD 12/06/2023 7:56 PM  Please look on [email or web address] to find out who is on call.

## 2023-12-06 NOTE — Progress Notes (Signed)
 Soapsuds enema administered, patient tolerated the procedure well. Kept patient on comfortable position with safety measures on placed. Monitor for bowel movement.

## 2023-12-06 NOTE — Plan of Care (Signed)
     Chart reviewed and update received from Dr. Tobie. Patient has made the decision to be DNR/DNI. She has felt overwhelmed and needs time to consider options moving forward. PMT to follow up tomorrow.  Thank you for your referral and allowing PMT to assist in [facility] care.   Stephane Palin, NP Palliative Medicine Team  Team Phone # [phone]   NO CHARGE

## 2023-12-07 ENCOUNTER — Inpatient Hospital Stay (HOSPITAL_COMMUNITY)

## 2023-12-07 DIAGNOSIS — I3139 Other pericardial effusion (noninflammatory): Secondary | ICD-10-CM | POA: Diagnosis not present

## 2023-12-07 LAB — CBC
HCT: 33 % — ABNORMAL LOW (ref 36.0–46.0)
Hemoglobin: 10.2 g/dL — ABNORMAL LOW (ref 12.0–15.0)
MCH: 25.6 pg — ABNORMAL LOW (ref 26.0–34.0)
MCHC: 30.9 g/dL (ref 30.0–36.0)
MCV: 82.9 fL (ref 80.0–100.0)
Platelets: 339 K/uL (ref 150–400)
RBC: 3.98 MIL/uL (ref 3.87–5.11)
RDW: 15.2 % (ref 11.5–15.5)
WBC: 9.3 K/uL (ref 4.0–10.5)
nRBC: 0 % (ref 0.0–0.2)

## 2023-12-07 LAB — BASIC METABOLIC PANEL WITH GFR
Anion gap: 7 (ref 5–15)
BUN: 74 mg/dL — ABNORMAL HIGH (ref 8–23)
CO2: 23 mmol/L (ref 22–32)
Calcium: 10.3 mg/dL (ref 8.9–10.3)
Chloride: 102 mmol/L (ref 98–111)
Creatinine, Ser: 1.34 mg/dL — ABNORMAL HIGH (ref 0.44–1.00)
GFR, Estimated: 39 mL/min — ABNORMAL LOW (ref >60)
Glucose, Bld: 105 mg/dL — ABNORMAL HIGH (ref 70–99)
Potassium: 4.7 mmol/L (ref 3.5–5.1)
Sodium: 132 mmol/L — ABNORMAL LOW (ref 135–145)

## 2023-12-07 LAB — MAGNESIUM: Magnesium: 3.3 mg/dL — ABNORMAL HIGH (ref 1.7–2.4)

## 2023-12-07 MED ORDER — METOCLOPRAMIDE HCL 5 MG/ML IJ SOLN
5.0000 mg | Freq: Three times a day (TID) | INTRAMUSCULAR | Status: DC
  Administered 2023-12-07 – 2023-12-08 (×4): 5 mg via INTRAVENOUS
  Filled 2023-12-07 (×4): qty 2

## 2023-12-07 MED ORDER — SORBITOL 70 % SOLN
60.0000 mL | Freq: Once | Status: AC
  Administered 2023-12-07: 60 mL via ORAL
  Filled 2023-12-07: qty 60

## 2023-12-07 NOTE — TOC Progression Note (Signed)
 Transition of Care [facility]) - Progression Note    Patient Details  Name: Tammy Wolfe MRN: [id number] Date of Birth: Oct 06, 1940  [facility] CM/SW Contact  Waddell Barnie Rama, RN Phone Number: 12/07/2023, 2:50 PM  Clinical Narrative:    Per palliative note patient feels overwhelmed and needs time to make a decision.  TOC will continue to follow.   Expected Discharge Plan: Home w Home Health Services Barriers to Discharge: Continued Medical Work up  Expected Discharge Plan and Services In-house Referral: NA Discharge Planning Services: CM Consult Post Acute Care Choice: Home Health Living arrangements for the past 2 months:  (Condo)                   DME Agency: NA                   Social Determinants of Health (SDOH) Interventions SDOH Screenings   Food Insecurity: No Food Insecurity (11/27/2023)  Housing: Low Risk  (11/27/2023)  Transportation Needs: No Transportation Needs (11/27/2023)  Utilities: Not At Risk (11/27/2023)  Alcohol  Screen: Low Risk  (04/21/2023)  Depression (PHQ2-9): Low Risk  (11/23/2023)  Financial Resource Strain: Low Risk  (04/21/2023)  Physical Activity: Inactive (04/21/2023)  Social Connections: Socially Isolated (11/29/2023)  Stress: No Stress Concern Present (04/21/2023)  Tobacco Use: Medium Risk (11/27/2023)  Health Literacy: Adequate Health Literacy (04/21/2023)    Readmission Risk Interventions     No data to display

## 2023-12-07 NOTE — Plan of Care (Signed)
  Problem: Health Behavior/Discharge Planning: Goal: Ability to manage health-related needs will improve Outcome: Progressing   Problem: Education: Goal: Knowledge of General Education information will improve Description: Including pain rating scale, medication(s)/side effects and non-pharmacologic comfort measures Outcome: Progressing   Problem: Clinical Measurements: Goal: Will remain free from infection Outcome: Progressing   

## 2023-12-07 NOTE — Progress Notes (Signed)
 [facility] Progress Note Patient: Tammy Wolfe FMW:[id number] DOB: 21-Apr-1941 DOA: 11/27/2023  DOS: the patient was seen and examined on 12/07/2023  Brief Hospital Course: PMH of breast cancer, COPD, HTN, HLD, history of alcohol  abuse and pancreatitis presented to the hospital secondary to abnormal CT findings.  Has shortness of breath for 3 weeks.  Underwent CT scan of the chest outpatient and was found to have pericardial effusion as well as possible lung mass with pleural effusion. Admitted to the ICU.  Cardiology was consulted. 7/4 underwent thoracentesis on right side. 7/7 underwent pericardial drain placement.  Developed A-fib with RVR. 7/9 pericardial drain removed.  Oncology was consulted as the cytology came back positive for pericardial fluid for malignancy. 7/11 transfer to hospitalist service.  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 As above underwent thoracentesis as well as pericardiocentesis. Pericardial fluid came back positive for malignancy although tissue was not enough to send for identification or further testing. Pleural effusion cytology is still pending but effusion is exudative. Oncology was consulted. 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  Not a candidate for systemic chemotherapy. Hospice and palliative care recommended by oncology which I agree as well. Palliative care is consulted.  Will monitor discussion. Pericardial drain is now removed. Plan is for conservative management from cardiology.  Repeat echocardiogram shows no reoccurrence of pericardial effusion. Pleural effusion appears to be reoccurring.    New onset atrial fibrillation with RVR. Currently remains in sinus rhythm. On amiodarone . Not a candidate for anticoagulation. Amiodarone  now switched to oral. Follow-up echocardiogram recommended outpatient.  Acute  respiratory failure with hypoxia. History of COPD/severe emphysema Recurrent right effusions. Treated with Lasix . Oxygenation improving but still requiring oxygen. Underwent thoracentesis although only 300 cc were removed. Currently needing high flow nasal cannula with 5 to 6 L of oxygen.  Treated with IV antibiotic.  Continue triple inhaler therapy. Monitor.  Underweight. Severe protein calorie malnutrition. Body mass index is 18.12 kg/m.  Placing the patient at a high risk for poor outcome. Likely in the setting of malignancy. Continue supplementation.  AKI. Hyponatremia. Azotemia Received diuresis.  Serum creatinine improved to 1.3 and worsened again to 1.6. Avoid nephrotoxic medication. Sodium level has been on the lower side.  Will monitor clinically. BUN is above 70s. Due to worsening trend of creatinine as well as BUN and patient's desire for now to continue active treatment, treated with IV fluid. Due to improvement in numbers, I will hold IV fluid.  Goals of care conversation. Palliative care following. Prognosis is poor in the setting of stage IV malignancy with with poor performance status. Not a candidate for chemotherapy. Radiation will not change the overall outlook/prognosis. Family discussing amongst himself with regards to approach although currently leaning towards focusing on more quality of life. 7/13 discussed with patient at bedside.  Patient is alert awake and oriented x 3 able to make decisions and able to understand what the medical situation.  Wants to be switched to DNR/DNI.  Daughter was at bedside at the time of the discussion. 7/14.  Awaiting palliative care to discuss with the family although patient still unsure.  Possible ileus. Gastric distention. Patient reported some abdominal discomfort as well as constipation with last BM prior to admission. X-ray abdomen was performed.  Which shows evidence of abdominal distention with some stool  burden. No success with enema passing gas and small BM.  Will add sorbitol .  Not a candidate for magnesium based bowel regimen.   Subjective: No nausea no vomiting no fever continues to have shortness of breath.  Was saturating 87% on 5 LPM at the time of my evaluation requiring 7 to 8 L.  Physical Exam: General: in moderate distress, No Rash Cardiovascular: S1 and S2 Present, No Murmur Respiratory: Increased respiratory effort, Bilateral Air entry present.  Basal crackles, No wheezes Abdomen: Bowel Sound present, No tenderness Extremities: No edema Neuro: Alert and oriented x3, no new focal deficit  Data Reviewed: I have Reviewed nursing notes, Vitals, and Lab results. Since last encounter, pertinent lab results CBC and BMP   . I have ordered test including CBC and BMP  . I have ordered imaging thoracentesis  .   Disposition: Status is: Inpatient Remains inpatient appropriate because: Monitor for improvement in clarity and goal of care.  Prognosis is poor.  heparin  injection 5,000 Units Start: 11/27/23 1745   Family Communication: No one at bedside Level of care: Progressive   Vitals:   12/07/23 0727 12/07/23 0830 12/07/23 1138 12/07/23 1631  BP: 111/78  135/69 (!) 109/58  Pulse: 92  100 89  Resp: 10  18 14   Temp: (!) 97.5 F (36.4 C)  98.3 F (36.8 C) 98.6 F (37 C)  TempSrc: Oral  Axillary Axillary  SpO2: 92% (!) 89% 90% 100%  Weight:      Height:         Author: Yetta Blanch, MD 12/07/2023 6:43 PM  Please look on [email or web address] to find out who is on call.

## 2023-12-07 NOTE — Plan of Care (Signed)

## 2023-12-07 NOTE — Procedures (Signed)
 PROCEDURE SUMMARY:  Successful image-guided right-sided diagnostic and therapeutic thoracentesis. Yielded 0.300 liters of clear, straw-colored pleural fluid. Patient tolerated procedure well. EBL: Zero No immediate complications.  Specimen was sent for labs. Post procedure CXR is pending.  Please see imaging section of Epic for full dictation.  Carlin LABOR Vadie Principato PA-C 12/07/2023 1:47 PM

## 2023-12-08 ENCOUNTER — Inpatient Hospital Stay (HOSPITAL_COMMUNITY)

## 2023-12-08 DIAGNOSIS — R64 Cachexia: Secondary | ICD-10-CM | POA: Diagnosis not present

## 2023-12-08 DIAGNOSIS — R918 Other nonspecific abnormal finding of lung field: Secondary | ICD-10-CM | POA: Diagnosis not present

## 2023-12-08 DIAGNOSIS — J90 Pleural effusion, not elsewhere classified: Secondary | ICD-10-CM | POA: Diagnosis not present

## 2023-12-08 DIAGNOSIS — J9601 Acute respiratory failure with hypoxia: Secondary | ICD-10-CM | POA: Diagnosis not present

## 2023-12-08 DIAGNOSIS — I3139 Other pericardial effusion (noninflammatory): Secondary | ICD-10-CM | POA: Diagnosis not present

## 2023-12-08 LAB — BASIC METABOLIC PANEL WITH GFR
Anion gap: 10 (ref 5–15)
BUN: 72 mg/dL — ABNORMAL HIGH (ref 8–23)
CO2: 23 mmol/L (ref 22–32)
Calcium: 12.1 mg/dL — ABNORMAL HIGH (ref 8.9–10.3)
Chloride: 99 mmol/L (ref 98–111)
Creatinine, Ser: 1.51 mg/dL — ABNORMAL HIGH (ref 0.44–1.00)
GFR, Estimated: 34 mL/min — ABNORMAL LOW (ref >60)
Glucose, Bld: 141 mg/dL — ABNORMAL HIGH (ref 70–99)
Potassium: 4.8 mmol/L (ref 3.5–5.1)
Sodium: 132 mmol/L — ABNORMAL LOW (ref 135–145)

## 2023-12-08 LAB — CBC
HCT: 37.7 % (ref 36.0–46.0)
Hemoglobin: 11.6 g/dL — ABNORMAL LOW (ref 12.0–15.0)
MCH: 25.8 pg — ABNORMAL LOW (ref 26.0–34.0)
MCHC: 30.8 g/dL (ref 30.0–36.0)
MCV: 83.8 fL (ref 80.0–100.0)
Platelets: 413 K/uL — ABNORMAL HIGH (ref 150–400)
RBC: 4.5 MIL/uL (ref 3.87–5.11)
RDW: 15.4 % (ref 11.5–15.5)
WBC: 9.8 K/uL (ref 4.0–10.5)
nRBC: 0 % (ref 0.0–0.2)

## 2023-12-08 LAB — MAGNESIUM: Magnesium: 3.6 mg/dL — ABNORMAL HIGH (ref 1.7–2.4)

## 2023-12-08 MED ORDER — ACETAMINOPHEN 325 MG PO TABS: 650.0000 mg | ORAL_TABLET | Freq: Four times a day (QID) | ORAL | Status: DC | PRN

## 2023-12-08 MED ORDER — METOPROLOL TARTRATE 25 MG PO TABS: 25.0000 mg | ORAL_TABLET | Freq: Two times a day (BID) | ORAL | Status: DC

## 2023-12-08 MED ORDER — ONDANSETRON HCL 4 MG/2ML IJ SOLN: 4.0000 mg | Freq: Four times a day (QID) | INTRAMUSCULAR | Status: DC | PRN

## 2023-12-08 MED ORDER — OXYCODONE HCL 5 MG PO TABS
5.0000 mg | ORAL_TABLET | ORAL | Status: DC | PRN
  Administered 2023-12-09 – 2023-12-11 (×5): 5 mg via ORAL
  Filled 2023-12-08 (×5): qty 1

## 2023-12-08 MED ORDER — GLYCOPYRROLATE 0.2 MG/ML IJ SOLN: 0.2000 mg | INTRAMUSCULAR | Status: DC | PRN

## 2023-12-08 MED ORDER — ALPRAZOLAM 0.25 MG PO TABS
0.2500 mg | ORAL_TABLET | Freq: Two times a day (BID) | ORAL | Status: DC
  Administered 2023-12-08 – 2023-12-13 (×9): 0.25 mg via ORAL
  Filled 2023-12-08 (×9): qty 1

## 2023-12-08 MED ORDER — MIDAZOLAM HCL 2 MG/2ML IJ SOLN
0.5000 mg | INTRAMUSCULAR | Status: DC | PRN
  Filled 2023-12-08: qty 2

## 2023-12-08 MED ORDER — POLYVINYL ALCOHOL 1.4 % OP SOLN: 1.0000 [drp] | Freq: Four times a day (QID) | OPHTHALMIC | Status: DC | PRN

## 2023-12-08 MED ORDER — HALOPERIDOL LACTATE 2 MG/ML PO CONC: 0.5000 mg | ORAL | Status: DC | PRN

## 2023-12-08 MED ORDER — HALOPERIDOL LACTATE 5 MG/ML IJ SOLN: 0.5000 mg | INTRAMUSCULAR | Status: DC | PRN

## 2023-12-08 MED ORDER — BISACODYL 10 MG RE SUPP: 10.0000 mg | Freq: Every day | RECTAL | Status: DC | PRN

## 2023-12-08 MED ORDER — HALOPERIDOL 0.5 MG PO TABS: 0.5000 mg | ORAL_TABLET | ORAL | Status: DC | PRN

## 2023-12-08 MED ORDER — GLYCOPYRROLATE 0.2 MG/ML IJ SOLN
0.2000 mg | INTRAMUSCULAR | Status: DC | PRN
  Administered 2023-12-14: 0.2 mg via INTRAVENOUS
  Filled 2023-12-08: qty 1

## 2023-12-08 MED ORDER — LORAZEPAM 2 MG/ML PO CONC: 0.5000 mg | ORAL | Status: DC | PRN

## 2023-12-08 MED ORDER — GLYCOPYRROLATE 1 MG PO TABS: 1.0000 mg | ORAL_TABLET | ORAL | Status: DC | PRN

## 2023-12-08 MED ORDER — ONDANSETRON 4 MG PO TBDP: 4.0000 mg | ORAL_TABLET | Freq: Four times a day (QID) | ORAL | Status: DC | PRN

## 2023-12-08 MED ORDER — ACETAMINOPHEN 650 MG RE SUPP: 650.0000 mg | Freq: Four times a day (QID) | RECTAL | Status: DC | PRN

## 2023-12-08 MED ORDER — BIOTENE DRY MOUTH MT LIQD: 15.0000 mL | OROMUCOSAL | Status: DC | PRN

## 2023-12-08 MED ORDER — MIDAZOLAM HCL 2 MG/2ML IJ SOLN
0.5000 mg | Freq: Four times a day (QID) | INTRAMUSCULAR | Status: DC | PRN
  Administered 2023-12-09 – 2023-12-14 (×4): 0.5 mg via INTRAVENOUS
  Filled 2023-12-08 (×3): qty 2

## 2023-12-08 MED ORDER — ALPRAZOLAM 0.25 MG PO TABS
0.2500 mg | ORAL_TABLET | ORAL | Status: AC
  Administered 2023-12-08: 0.25 mg via ORAL
  Filled 2023-12-08: qty 1

## 2023-12-08 MED ORDER — HYDROMORPHONE HCL 1 MG/ML IJ SOLN
0.5000 mg | INTRAMUSCULAR | Status: DC | PRN
  Administered 2023-12-10 (×2): 0.5 mg via INTRAVENOUS
  Administered 2023-12-10 – 2023-12-11 (×4): 1 mg via INTRAVENOUS
  Administered 2023-12-11 (×2): 0.5 mg via INTRAVENOUS
  Administered 2023-12-11 – 2023-12-12 (×5): 1 mg via INTRAVENOUS
  Filled 2023-12-08 (×14): qty 1

## 2023-12-08 NOTE — Progress Notes (Signed)
 Patient ID: Tammy Wolfe, female   DOB: April 07, 1941, 83 y.o.   MRN: [id number]    Progress Note from the Palliative Medicine Team at [facility]   Patient Name: Tammy Wolfe        Date: 12/08/2023 DOB: 1940/07/21  Age: 83 y.o. MRN#: [id number] Attending Physician: Tobie Yetta HERO, MD Primary Care Physician: [name] ORN, MD Admit Date: 11/27/2023   Reason for Consultation/Follow-up   Establishing Goals of Care   HPI/ Brief Hospital Review  83 y.o. female  with past medical history of breast cancer, emphysema, HTN, HLD, H/o ETOH abuse, ETOH hepatitis and pancreatitis admitted on 11/27/2023 with at the direction of PCP with abnormal CT findings. CT with bilateral moderate pleural effusions as well as pericardial effusion and concern for mediastinal mass. 7/4 underwent R thoracentesis. 7/7 had pericardial drain placed and was removed 7/9. Also with a fib RVR during admission. Pericardial fluid consistent with malignancy. PMT consulted to discuss GOC.   Initial palliative medicine consult on 12/04/2023  Attending requested return visit for further conversation regarding goals of care.   Subjective  Extensive chart review has been completed prior to meeting with patient/family  including labs, vital signs, imaging, progress/consult notes, orders, medications and available advance directive documents.    This NP assessed patient at the bedside as a follow up for palliative medicine needs and emotional support.  I met today at the bedside with patient and her daughter Tammy Wolfe  Education offered on the seriousness of patient's current medical situation.  Today we spoke specifically to patient's current respiratory status; increased oxygen needs and overall failure to thrive secondary to metastatic cancer with malignant pleural effusion.  Patient and her daughter understand the diagnosis.  Education offered on the difference between a full medical support path attempting to prolong life and  palliative supportive path allowing for a natural death focusing on comfort.  Occasion offered on hospice benefit; philosophy and eligibility.  Education offered on option of inpatient hospice facility for end-of-life care.  Questions and concerns addressed.  At this moment patient wishes to continue to process her current medical situation, continue conversation with her daughter before making any further decisions.   Patient and her daughter recognize that regardless of decisions patient cannot return home as she lives alone with limited support.  Questions  and concerns addressed.  BMT will follow up tomorrow for further discussion regarding plan of care  Education offered today regarding  the importance of continued conversation with family and their  medical providers regarding overall plan of care and treatment options,  ensuring decisions are within the context of the patients values and GOCs.  Questions and concerns addressed   Discussed with primary team and nursing staff   Time: 50 minutes  Discussed with Dr. Tobie  Detailed review of medical records ( labs, imaging, vital signs), medically appropriate exam ( MS, skin, cardiac,  resp)   discussed with treatment team, counseling and education to patient, family, staff, documenting clinical information, medication management, coordination of care    Ronal Plants NP  Palliative Medicine Team Team Phone # [phone] Pager [phone]

## 2023-12-08 NOTE — Progress Notes (Signed)
 Occupational Therapy Treatment Patient Details Name: Tammy Wolfe MRN: [id number] DOB: Aug 18, 1940 Today's Date: 12/08/2023   History of present illness 83 y.o female admitted 11/27/23 with SOB, pericardial effusion & bil pleural effusions, concern for mediastinal mass.7/4 & 7/14 thoracentesis. 7/7 S/p pericardiocentesis with drain. 7/8 Afib with RVR. 7/9 pericardial drain removed and malignancy confirmed. PMH: COPD, breast CA s/p double mastectomy, HTN, arthritis, HLD, GAD, chronic back pain, neuropathy   OT comments  Pt fatigued after PT session up in chair. Pt expressed anxiety at this time with some comfort after guided breathing exercises. Pt thanking therapist for guiding with DME home recommendations and breathing. If patient is to d/c home recommend maximize all DME possible: BSC, hospital bed, lift, diaper, bed pads. Family still arranging care for possible home. Family expressed wanting to give patient this week at hospital to then decide on where patient would be. OT expressed that focusing on daily comfort and quality of life might help with overall care and decisions.       If plan is discharge home, recommend the following:  A lot of help with walking and/or transfers;A lot of help with bathing/dressing/bathroom;Assistance with cooking/housework   Equipment Recommendations  BSC/3in1;Wheelchair (measurements OT);Wheelchair cushion (measurements OT);Hospital bed;Hoyer lift    Recommendations for Other Services      Precautions / Restrictions Precautions Precautions: Fall;Other (comment) Recall of Precautions/Restrictions: Impaired Precaution/Restrictions Comments: urinary incontinence       Mobility Bed Mobility               General bed mobility comments: oob in chair on arrival. pt with anxiety at this time and worked on controlled breathing with pursed lip support.    Transfers                   General transfer comment: just transfered to chair with PT and  rebounding.     Balance                                           ADL either performed or assessed with clinical judgement   ADL Overall ADL's : Needs assistance/impaired                                       General ADL Comments: educated on pursed lip breathing, provided handout on breathing exercise, inspirometer and wheelchair chair bump. Education provided to the daughter regarding home care needs if to d/c home from therapy recommendations. Daughter POA states mutliple family members that can help care for patient but then states she lives alone so night time concerns. OT advised that therapy would advise night time care possile so family staying with patient. Daughter educated on using a calendar to help arrange family care times then determining where possible hired care might need to be private paid to help. Daughter stated goal was to have patient walk to wheelchair since she walked in the hospital. OT helped include patient in conversation and pt clearly expressed she can not walk. Pt clearly expressed difficulty with sit to stand tolerance. OT educated that quality of life and comfort each time shoudl be priority and making sure basic needs are met. the goal from therapy would be for her to have enough trunk activation to sit upright in w/c without external support and  slide transfer to chair surfaces. Pt likely to need hospital bed, home care CNA, lift, wheelchair, bedside commode, diaper    Extremity/Trunk Assessment Upper Extremity Assessment Upper Extremity Assessment: Generalized weakness   Lower Extremity Assessment Lower Extremity Assessment: Generalized weakness        Vision       Perception     Praxis     Communication Communication Communication: Impaired Factors Affecting Communication: Difficulty expressing self   Cognition Arousal: Lethargic Behavior During Therapy: Flat affect Cognition: Difficult to assess              OT - Cognition Comments: fatigued but following commands. pt with need to take a breath between every word and at one point every 2 words. pt then states  you tell her to daughter due to fatigue attmepting to talk                 Following commands: Intact        Cueing   Cueing Techniques: Verbal cues  Exercises Exercises: Other exercises Other Exercises Other Exercises: pursed lip/ diaphragmatic  breathing Other Exercises: medbridge breathing exercises [id number]    Shoulder Instructions       General Comments HR 141 sitting in chair with tachycardia on monitor during time in room.  7 L [place]    Pertinent Vitals/ Pain       Pain Assessment Pain Assessment: No/denies pain  Home Living                                          Prior Functioning/Environment              Frequency  Min 2X/week        Progress Toward Goals  OT Goals(current goals can now be found in the care plan section)  Progress towards OT goals: Progressing toward goals  Acute Rehab OT Goals Patient Stated Goal: to rest OT Goal Formulation: With patient/family Time For Goal Achievement: 12/19/23 Potential to Achieve Goals: Good ADL Goals Pt Will Perform Grooming: with set-up;sitting Pt Will Perform Upper Body Dressing: with set-up;sitting Pt Will Perform Lower Body Dressing: with min assist;sit to/from stand Pt Will Transfer to Toilet: with min assist;bedside commode;stand pivot transfer Pt Will Perform Toileting - Clothing Manipulation and hygiene: with min assist;sitting/lateral leans;sit to/from stand  Plan      Co-evaluation                 AM-PAC OT 6 Clicks Daily Activity     Outcome Measure   Help from another person eating meals?: A Little Help from another person taking care of personal grooming?: A Little Help from another person toileting, which includes using toliet, bedpan, or urinal?: Total Help from another person bathing  (including washing, rinsing, drying)?: Total Help from another person to put on and taking off regular upper body clothing?: A Lot Help from another person to put on and taking off regular lower body clothing?: Total 6 Click Score: 11    End of Session Equipment Utilized During Treatment: Oxygen  OT Visit Diagnosis: Unsteadiness on feet (R26.81);Other abnormalities of gait and mobility (R26.89);Muscle weakness (generalized) (M62.81)   Activity Tolerance Treatment limited secondary to medical complications (Comment)   Patient Left in bed;with call bell/phone within reach   Nurse Communication Mobility status;Need for lift equipment        Time: [phone] OT  Time Calculation (min): 41 min  Charges: OT General Charges $OT Visit: 1 Visit OT Treatments $Self Care/Home Management : 38-52 mins   Brynn, OTR/L  Acute Rehabilitation Services Office: [phone] .   Ely Molt 12/08/2023, 12:07 PM

## 2023-12-08 NOTE — Progress Notes (Signed)
 [facility] Progress Note Patient: Tammy Wolfe FMW:[id number] DOB: 29-Mar-1941 DOA: 11/27/2023  DOS: the patient was seen and examined on 12/08/2023  Brief Hospital Course: PMH of breast cancer, COPD, HTN, HLD, history of alcohol  abuse and pancreatitis presented to the hospital secondary to abnormal CT findings.  Has shortness of breath for 3 weeks.  Underwent CT scan of the chest outpatient and was found to have pericardial effusion as well as possible lung mass with pleural effusion. Admitted to the ICU.  Cardiology was consulted. 7/4 underwent thoracentesis on right side. 7/7 underwent pericardial drain placement.  Developed A-fib with RVR. 7/9 pericardial drain removed.  Oncology was consulted as the cytology came back positive for pericardial fluid for malignancy. 7/11 transfer to hospitalist service. 7/14.  Repeat thoracentesis performed. 7/15.  After conversation with the patient, currently comfort care.  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 As above underwent thoracentesis as well as pericardiocentesis. Pericardial fluid came back positive for malignancy although tissue was not enough to send for identification or further testing. Pleural effusion cytology is still pending but effusion is exudative. Oncology was consulted. 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  Not a candidate for systemic chemotherapy. Hospice and palliative care recommended by oncology which I agree as well. Palliative care is consulted.  Will monitor discussion. Pericardial drain is now removed. Plan is for conservative management from cardiology.  Repeat echocardiogram shows no reoccurrence of pericardial effusion. Pleural effusion appears to be reoccurring.    New onset atrial fibrillation with RVR. On amiodarone . Not a candidate for anticoagulation. Follow-up  echocardiogram recommended outpatient. Developed RVR again on 7/15.  Acute respiratory failure with hypoxia. History of COPD/severe emphysema Recurrent right effusions. Treated with Lasix . Currently needing high flow nasal cannula with 5 to 6 L of oxygen. Treated with IV antibiotic. Now comfort care. Monitor.  Underweight. Severe protein calorie malnutrition. Body mass index is 18.12 kg/m.  Placing the patient at a high risk for poor outcome. Likely in the setting of malignancy. Continue supplementation.  AKI. Hyponatremia. Azotemia Received diuresis.  Serum creatinine improved to 1.3 and worsened again to 1.6. Avoid nephrotoxic medication. Sodium level has been on the lower side.  Will monitor clinically. BUN is above 70s. Due to worsening trend of creatinine as well as BUN and patient's desire for now to continue active treatment, treated with IV fluid. After holding the IV fluid renal function worsens again. Now comfort care.  Goals of care conversation. Palliative care following. Prognosis is poor in the setting of stage IV malignancy with with poor performance status. Not a candidate for chemotherapy. Radiation will not change the overall outlook/prognosis. Family discussing amongst himself with regards to approach although currently leaning towards focusing on more quality of life. 7/13 discussed with patient at bedside.  Patient is alert awake and oriented x 3 able to make decisions and able to understand what the medical situation.  Wants to be switched to DNR/DNI.  Daughter was at bedside at the time of the discussion. 7/15.  Discussed with family.  Patient would like to switch to comfort care.  Will monitor progression in the hospital.  Recommended family to at least initiate conversation with residential hospice although I am worried that the patient will deteriorate very rapidly given current RVR and worsening renal function with poor p.o. intake.  Possible  ileus. Gastric distention. Patient reported some abdominal discomfort as well as constipation  with last BM prior to admission. X-ray abdomen was performed.  Which shows evidence of abdominal distention with some stool burden. No success with enema passing gas and small BM.  Will add sorbitol .  Not a candidate for magnesium based bowel regimen.   Subjective: No nausea.  Has some burping.  Shortness of breath improving after thoracentesis.  No chest pain.  Still has anxiety.  Physical Exam: General: in moderate distress, No Rash Cardiovascular: S1 and S2 Present, No Murmur Respiratory: Increased respiratory effort, Bilateral Air entry present.  Basal crackles, No wheezes Abdomen: Bowel Sound present, No tenderness Extremities: No edema Neuro: Alert and oriented x3, no new focal deficit  Data Reviewed: I have Reviewed nursing notes, Vitals, and Lab results. Since last encounter, pertinent lab results CBC and BMP   . I have ordered test including none  . I have discussed pt's care plan and test results with palliative care  .   Disposition: Status is: Inpatient Remains inpatient appropriate because: Continue comfort care.  Anticipating hospital death.  Family Communication: Discussed with family at bedside Level of care: Med-Surg   Vitals:   12/08/23 1308 12/08/23 1352 12/08/23 1400 12/08/23 1600  BP: 101/60 100/60 120/75 (!) 101/58  Pulse: (!) 110 (!) 110 (!) 112 (!) 128  Resp: (!) 33 (!) 23 13 18   Temp: 97.6 F (36.4 C) 97.8 F (36.6 C) 97.7 F (36.5 C) 98.4 F (36.9 C)  TempSrc: Axillary Axillary Axillary   SpO2: 100% 99% 100% 97%  Weight:      Height:         Author: Yetta Blanch, MD 12/08/2023 7:16 PM  Please look on [email or web address] to find out who is on call.

## 2023-12-08 NOTE — Progress Notes (Signed)
 Physical Therapy Treatment Patient Details Name: Tammy Wolfe MRN: [id number] DOB: 03-27-1941 Today's Date: 12/08/2023   History of Present Illness 83 y.o female admitted 11/27/23 with SOB, pericardial effusion & bil pleural effusions, concern for mediastinal mass.7/4 & 7/14 thoracentesis. 7/7 S/p pericardiocentesis with drain. 7/8 Afib with RVR. 7/9 pericardial drain removed and malignancy confirmed. PMH: COPD, breast CA s/p double mastectomy, HTN, arthritis, HLD, GAD, chronic back pain, neuropathy    PT Comments  Pt pleasant, reports SOB but SPO2 100% on 7L throughout session with HR 103. Pt requires min physical assist for all transfers, fatigues quickly and unable to take steps or tolerate HEp at this time. Physical assist for pericare and education for need for 24hr care if plan to return home vs potential of residential hospice. Pt happy to be OOB and stated she didn't think she could tolerate upright for long periods with RN and OT made aware. Will continue to follow to maximize mobility for improved quality of life and decreased burden of care.   If plan is discharge home, recommend the following: Help with stairs or ramp for entrance;Assistance with cooking/housework;Assist for transportation;A lot of help with walking and/or transfers;A lot of help with bathing/dressing/bathroom   Can travel by private vehicle        Equipment Recommendations  Wheelchair (measurements PT);BSC/3in1;Wheelchair cushion (measurements PT)    Recommendations for Other Services       Precautions / Restrictions Precautions Precautions: Fall;Other (comment) Recall of Precautions/Restrictions: Impaired Precaution/Restrictions Comments: urinary incontinence     Mobility  Bed Mobility Overal bed mobility: Needs Assistance Bed Mobility: Supine to Sit     Supine to sit: Contact guard, HOB elevated, Used rails     General bed mobility comments: increased time, use of rail and HOB 35 degrees to pivot to  right side of bed    Transfers Overall transfer level: Needs assistance   Transfers: Sit to/from Stand, Bed to chair/wheelchair/BSC Sit to Stand: Min assist Stand pivot transfers: Min assist         General transfer comment: min assist to rise from bed and BSC with face to face technique, pt holding onto therapist for pivot to [facility] and then to recliner. pt able to stand from recliner with support of RW for pericare    Ambulation/Gait               General Gait Details: unable   Stairs             Wheelchair Mobility     Tilt Bed    Modified Rankin (Stroke Patients Only)       Balance Overall balance assessment: Needs assistance Sitting-balance support: Single extremity supported, Feet unsupported, Feet supported Sitting balance-Leahy Scale: Fair Sitting balance - Comments: EOB and BSC without UB support   Standing balance support: Bilateral upper extremity supported, During functional activity, Reliant on assistive device for balance Standing balance-Leahy Scale: Poor Standing balance comment: reliant on UB support in standing                            Communication Communication Communication: Impaired Factors Affecting Communication: Difficulty expressing self  Cognition Arousal: Alert Behavior During Therapy: Flat affect   PT - Cognitive impairments: No apparent impairments                         Following commands: Intact      Cueing Cueing Techniques: Verbal  cues  Exercises      General Comments        Pertinent Vitals/Pain Pain Assessment Pain Assessment: 0-10 Pain Score: 4  Pain Location: chest Pain Descriptors / Indicators: Aching Pain Intervention(s): Limited activity within patient's tolerance, Monitored during session, Repositioned    Home Living                          Prior Function            PT Goals (current goals can now be found in the care plan section) Progress towards PT  goals: Progressing toward goals    Frequency    Min 2X/week      PT Plan      Co-evaluation              AM-PAC PT 6 Clicks Mobility   Outcome Measure  Help needed turning from your back to your side while in a flat bed without using bedrails?: A Little Help needed moving from lying on your back to sitting on the side of a flat bed without using bedrails?: A Little Help needed moving to and from a bed to a chair (including a wheelchair)?: A Little Help needed standing up from a chair using your arms (e.g., wheelchair or bedside chair)?: A Little Help needed to walk in hospital room?: Total Help needed climbing 3-5 steps with a railing? : Total 6 Click Score: 14    End of Session Equipment Utilized During Treatment: Gait belt;Oxygen Activity Tolerance: Patient limited by fatigue Patient left: in chair;with call bell/phone within reach;with chair alarm set Nurse Communication: Mobility status PT Visit Diagnosis: Unsteadiness on feet (R26.81);Other abnormalities of gait and mobility (R26.89);Muscle weakness (generalized) (M62.81)     Time: 9144-9080 PT Time Calculation (min) (ACUTE ONLY): 24 min  Charges:    $Therapeutic Activity: 23-37 mins PT General Charges $$ ACUTE PT VISIT: 1 Visit                     Lenoard SQUIBB, PT Acute Rehabilitation Services Office: [phone]    Lenoard NOVAK Xayden Linsey 12/08/2023, 10:57 AM

## 2023-12-09 DIAGNOSIS — I3139 Other pericardial effusion (noninflammatory): Secondary | ICD-10-CM | POA: Diagnosis not present

## 2023-12-09 DIAGNOSIS — R64 Cachexia: Secondary | ICD-10-CM | POA: Diagnosis not present

## 2023-12-09 DIAGNOSIS — J90 Pleural effusion, not elsewhere classified: Secondary | ICD-10-CM | POA: Diagnosis not present

## 2023-12-09 DIAGNOSIS — R918 Other nonspecific abnormal finding of lung field: Secondary | ICD-10-CM | POA: Diagnosis not present

## 2023-12-09 DIAGNOSIS — J9601 Acute respiratory failure with hypoxia: Secondary | ICD-10-CM | POA: Diagnosis not present

## 2023-12-09 LAB — CYTOLOGY - NON PAP

## 2023-12-09 NOTE — Progress Notes (Signed)
 Patient ID: Tammy Wolfe, female   DOB: 1940-12-25, 83 y.o.   MRN: [id number]    Progress Note from the Palliative Medicine Team at [facility]   Patient Name: Tammy Wolfe        Date: 12/09/2023 DOB: 1940/08/15  Age: 83 y.o. MRN#: [id number] Attending Physician: Trixie Nilda HERO, MD Primary Care Physician: [name] ORN, MD Admit Date: 11/27/2023   Reason for Consultation/Follow-up   Establishing Goals of Care   HPI/ Brief Hospital Review  83 y.o. female  with past medical history of breast cancer, emphysema, HTN, HLD, H/o ETOH abuse, ETOH hepatitis and pancreatitis admitted on 11/27/2023 with at the direction of PCP with abnormal CT findings. CT with bilateral moderate pleural effusions as well as pericardial effusion and concern for mediastinal mass. 7/4 underwent R thoracentesis. 7/7 had pericardial drain placed and was removed 7/9. Also with a fib RVR during admission. Pericardial fluid consistent with malignancy. PMT consulted to discuss GOC.   Initial palliative medicine consult on 12/04/2023  This NP spoke yesterday with patient and family  for further conversation regarding goals of care.   Subjective  Extensive chart review has been completed prior to meeting with patient/family  including labs, vital signs, imaging, progress/consult notes, orders, medications and available advance directive documents.    This NP assessed patient at the bedside as a follow up for palliative medicine needs and emotional support.  I met again today at the bedside with patient, her daughter Tammy Wolfe and her Tammy Wolfe for on going conversation regarding current medical situation and plan of care.  Tammy Wolfe tells me that decision has been made to shift to a full comfort path allowing for natural death.  Education offered on hospice benefit; philosophy and eligibility specifically to an inpatient hospice facility.  Education offered on symptom management specific to dyspnea/air hunger -    Dilaudid  IV 0.5 to 2 mg every 30 minutes as needed for pain or dyspnea - Oxycodone  IR 5 mg p.o. every 4 hours as needed for pain - Xanax  0.25 mg p.o. twice daily   Patient and her daughter understand the limited prognosis.  Education offered on the difference between a full medical support path attempting to prolong life and palliative supportive path allowing for a natural death focusing on comfort.  Education offered on hospice benefit; philosophy and eligibility.  Education offered on option of inpatient hospice facility for end-of-life care.  They are interested in [facility], will place order for choice.  Education offered on the natural trajectory and expectations at EOL.  Occasion offered on hospice benefit; philosophy and eligibility.  Education offered on option of inpatient hospice facility for end-of-life care.  Questions and concerns addressed.   Questions  and concerns addressed.  PMT will follow up tomorrow for further discussion regarding plan of care  Education offered today regarding  the importance of continued conversation with family and their  medical providers regarding overall plan of care and treatment options,  ensuring decisions are within the context of the patients values and GOCs.  Questions and concerns addressed   Discussed with primary team/ Dr Charmayne and nursing staff   Time: 50 minutes   Detailed review of medical records ( labs, imaging, vital signs), medically appropriate exam ( MS, skin, cardiac,  resp)   discussed with treatment team, counseling and education to patient, family, staff, documenting clinical information, medication management, coordination of care    Ronal Plants NP  Palliative Medicine Team Team Phone # [phone] Pager [phone]

## 2023-12-09 NOTE — Plan of Care (Signed)
 Pt appears to understand and accept diagnosis

## 2023-12-09 NOTE — Progress Notes (Signed)
 PROGRESS NOTE  Tammy Wolfe FMW:[id number] DOB: [birth year] DOA: 11/27/2023 PCP: [name] ORN, MD   LOS: 12 days   Brief Narrative / Interim history: PMH of breast cancer, COPD, HTN, HLD, history of alcohol  abuse and pancreatitis presented to the hospital secondary to abnormal CT findings.  Has shortness of breath for 3 weeks.  Underwent CT scan of the chest outpatient and was found to have pericardial effusion as well as possible lung mass with pleural effusion. Admitted to the ICU.  Cardiology was consulted. 7/4 underwent thoracentesis on right side. 7/7 underwent pericardial drain placement.  Developed A-fib with RVR. 7/9 pericardial drain removed.  Oncology was consulted as the cytology came back positive for pericardial fluid for malignancy. 7/11 transfer to hospitalist service. 7/14.  Repeat thoracentesis performed. 7/15.  After conversation with the patient, currently comfort care.  Subjective / 24h Interval events: She is comfortable, had an episode of shortness of breath and anxiety earlier on this morning but received medications and is now feeling better  Assesement and Plan: Principal Problem:   Pericardial effusion Active Problems:   Pleural effusion, bilateral   Recent unexplained weight loss   Hoarse voice quality   Cigarette smoker   Chronic obstructive pulmonary disease with acute exacerbation (HCC)   HX: breast cancer   Acute respiratory failure with hypoxia (HCC)   Mediastinal mass   Pleural effusion   Protein-calorie malnutrition, severe   Cachexia (HCC)   Pressure injury of skin   Principal problem 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  Underwent thoracentesis as well as pericardiocentesis, and cytology of the pericardial fluid came back positive for malignancy, although tissue was not enough to send provide medication or further  testing.  She has stage IV malignancy, has very poor prognosis given poor nutritional status, not a candidate for standard chemotherapy.  Palliative care consulted, she has not been transitioned to comfort.  Awaiting home with hospice versus residential hospice placement  Active problems  New onset atrial fibrillation with RVR - On amiodarone .  Not a candidate for anticoagulation.  Follow-up echocardiogram recommended is now  Acute respiratory failure with hypoxia. History of COPD/severe emphysema. Recurrent right effusions -Treated with Lasix . Currently needing high flow, 7 L.  Was on IV antibiotics, now comfort care  Severe protein calorie malnutrition. Body mass index is 18.12 kg/m - Placing the patient at a high risk for poor outcome. Likely in the setting of malignancy.   AKI, hyponatremia - Received diuresis.  Serum creatinine improved to 1.3 and worsened again to 1.6. Avoid nephrotoxic medication. Sodium level has been on the lower side.  Will monitor clinically. Now comfort care.   Goals of care conversation - Palliative care following. Prognosis is poor in the setting of stage IV malignancy with with poor performance status. Not a candidate for chemotherapy.   Possible ileus, gastric distention -symptomatic management  Scheduled Meds:  ALPRAZolam   0.25 mg Oral BID   amiodarone   200 mg Oral BID   arformoterol   15 mcg Nebulization BID   budesonide  (PULMICORT ) nebulizer solution  0.5 mg Nebulization BID   Chlorhexidine  Gluconate Cloth  6 each Topical Daily   feeding supplement  237 mL Oral TID BM   guaiFENesin   600 mg Oral BID   nystatin   5 mL Oral QID   polyethylene glycol  17 g Oral Daily   revefenacin   175 mcg Nebulization Daily   simethicone   80 mg  Oral QID   sodium chloride  flush  3 mL Intravenous Q12H   Continuous Infusions: PRN Meds:.acetaminophen  **OR** acetaminophen , antiseptic oral rinse, artificial tears, bisacodyl , glycopyrrolate  **OR** glycopyrrolate  **OR**  glycopyrrolate , haloperidol  **OR** haloperidol  **OR** haloperidol  lactate, HYDROmorphone  (DILAUDID ) injection, ipratropium-albuterol , lip balm, LORazepam  **OR** midazolam , methocarbamol  (ROBAXIN ) injection, midazolam , ondansetron  **OR** ondansetron  (ZOFRAN ) IV, oxyCODONE   Current Outpatient Medications  Medication Instructions   acetaminophen  (TYLENOL ) 650 mg, Every 6 hours PRN   albuterol  (VENTOLIN  HFA) 108 (90 Base) MCG/ACT inhaler 2 puffs, Inhalation, Every 6 hours PRN   ALPRAZolam  (XANAX ) 0.25 MG tablet TAKE ONE TABLET THREE TIMES DAILY AS NEEDED   cyanocobalamin  1,000 mcg, Daily   cyclobenzaprine  (FLEXERIL ) 5 MG tablet 1 tab by mouth at bedtime as needed for cramps and back pain   Fluticasone-Umeclidin-Vilant (TRELEGY ELLIPTA ) 100-62.5-25 MCG/ACT AEPB 1 puff, Inhalation, Daily   HYDROcodone -acetaminophen  (NORCO/VICODIN) 5-325 MG tablet 1 tablet, Oral, 2 times daily PRN   losartan  (COZAAR ) 100 mg, Oral, Daily   predniSONE  (DELTASONE ) 10 MG tablet 3 tabs by mouth per day for 3 days,2tabs per day for 3 days,1tab per day for 3 days   trimethoprim  (TRIMPEX ) 100 mg, Oral, Daily   Vitamin D  1,000 Units, Daily    Diet Orders (From admission, onward)     Start     Ordered   12/01/23 1302  Diet regular Room service appropriate? Yes with Assist; Fluid consistency: Thin  Diet effective now       Question Answer Comment  Room service appropriate? Yes with Assist   Fluid consistency: Thin      12/01/23 1301            DVT prophylaxis:    Lab Results  Component Value Date   PLT 413 (H) 12/08/2023      Code Status: Do not attempt resuscitation (DNR) - Comfort care  Family Communication: no family at bedside   Status is: Inpatient Remains inpatient appropriate because: severity of illness  Level of care: Med-Surg  Consultants:  Palliative Cardiology   Objective: Vitals:   12/09/23 0533 12/09/23 0700 12/09/23 0800 12/09/23 0928  BP: (!) 115/52 114/66 118/63   Pulse: 84   90   Resp: 14 10 14    Temp: 97.6 F (36.4 C)  97.6 F (36.4 C)   TempSrc: Oral  Oral   SpO2: 100%  96% 95%  Weight: 44.2 kg     Height:        Intake/Output Summary (Last 24 hours) at 12/09/2023 1244 Last data filed at 12/08/2023 1300 Gross per 24 hour  Intake --  Output 250 ml  Net -250 ml   Wt Readings from Last 3 Encounters:  12/09/23 44.2 kg  11/23/23 36.3 kg  11/10/23 37.6 kg    Examination:  Constitutional: NAD Eyes: no scleral icterus ENMT: Mucous membranes are moist.  Neck: normal, supple Respiratory: clear to auscultation bilaterally, no wheezing, no crackles.  Cardiovascular: Regular rate and rhythm, no murmurs / rubs / gallops. No LE edema.  Abdomen: non distended, no tenderness. Bowel sounds positive.  Musculoskeletal: no clubbing / cyanosis.   Data Reviewed: I have independently reviewed following labs and imaging studies   CBC Recent Labs  Lab 12/04/23 0244 12/05/23 0810 12/06/23 0335 12/07/23 0314 12/08/23 0253  WBC 9.6 10.5 12.1* 9.3 9.8  HGB 11.7* 11.0* 10.5* 10.2* 11.6*  HCT 38.1 35.0* 33.2* 33.0* 37.7  PLT 356 362 347 339 413*  MCV 85.0 83.9 82.8 82.9 83.8  MCH 26.1 26.4 26.2 25.6* 25.8*  MCHC 30.7 31.4 31.6 30.9 30.8  RDW 15.6* 15.4 15.2 15.2 15.4    Recent Labs  Lab 12/04/23 0244 12/05/23 0241 12/05/23 1449 12/06/23 0335 12/06/23 2035 12/07/23 0314 12/08/23 0253  NA 128* 126* 128* 128* 129* 132* 132*  K 4.9 4.8 3.7 4.6 5.1 4.7 4.8  CL 93* 91* 101 95* 100 102 99  CO2 22 32 17* 24 20* 23 23  GLUCOSE 107* 116* 143* 134* 128* 105* 141*  BUN 85* 95* 78* 93* 85* 74* 72*  CREATININE 1.58* 1.99* 1.46* 1.71* 1.42* 1.34* 1.51*  CALCIUM 9.7 10.2 7.7* 10.1 10.3 10.3 12.1*  MG 2.9* 3.2*  --  3.4*  --  3.3* 3.6*    ------------------------------------------------------------------------------------------------------------------ No results for input(s): CHOL, HDL, LDLCALC, TRIG, CHOLHDL, LDLDIRECT in the last 72 hours.  Lab  Results  Component Value Date   HGBA1C 6.5 11/23/2023   ------------------------------------------------------------------------------------------------------------------ No results for input(s): TSH, T4TOTAL, T3FREE, THYROIDAB in the last 72 hours.  Invalid input(s): FREET3  Cardiac Enzymes No results for input(s): CKMB, TROPONINI, MYOGLOBIN in the last 168 hours.  Invalid input(s): CK ------------------------------------------------------------------------------------------------------------------    Component Value Date/Time   BNP 60.1 11/27/2023 1430    CBG: No results for input(s): GLUCAP in the last 168 hours.  Recent Results (from the past 240 hours)  Culture, body fluid w Gram Stain-bottle     Status: None   Collection Time: 11/30/23  2:09 PM   Specimen: Path fluid  Result Value Ref Range Status   Specimen Description FLUID  Final   Special Requests NONE  Final   Culture   Final    NO GROWTH 5 DAYS Performed at [facility], [address], [place], [place] [address]    Report Status 12/05/2023 FINAL  Final  Gram stain     Status: None   Collection Time: 11/30/23  2:09 PM   Specimen: Path fluid  Result Value Ref Range Status   Specimen Description FLUID  Final   Special Requests NONE  Final   Gram Stain   Final    RARE WBC PRESENT, PREDOMINANTLY MONONUCLEAR NO ORGANISMS SEEN Performed at [facility], [address] [place], [place] [address]    Report Status 11/30/2023 FINAL  Final  MRSA Next Gen by PCR, Nasal     Status: None   Collection Time: 11/30/23  4:17 PM   Specimen: Nasal Mucosa; Nasal Swab  Result Value Ref Range Status   MRSA by PCR Next Gen NOT DETECTED NOT DETECTED Final    Comment: (NOTE) The GeneXpert MRSA Assay (FDA approved for NASAL specimens only), is one component of a comprehensive MRSA colonization surveillance program. It is not intended to diagnose MRSA infection nor to guide or monitor treatment  for MRSA infections. Test performance is not FDA approved in patients less than 43 years old. Performed at [facility], [address] [place], [place] [address]      Radiology Studies: No results found.   Nilda Fendt, MD, PhD [facility]  Between 7 am - 7 pm I am available, please contact me via Amion (for emergencies) or Securechat (non urgent messages)  Between 7 pm - 7 am I am not available, please contact night coverage MD/APP via Amion

## 2023-12-09 NOTE — Progress Notes (Signed)
 Nutrition Brief Note  Chart reviewed. Pt now transitioning to comfort care.  No further nutrition interventions planned at this time.  Please re-consult as needed.   Noya Santarelli Daml-Budig, RDN, LDN Registered Dietitian Nutritionist RD Inpatient Contact Info in [place]

## 2023-12-10 DIAGNOSIS — I3139 Other pericardial effusion (noninflammatory): Secondary | ICD-10-CM | POA: Diagnosis not present

## 2023-12-10 DIAGNOSIS — Z515 Encounter for palliative care: Secondary | ICD-10-CM

## 2023-12-10 MED ORDER — OXYCODONE HCL 5 MG PO TABS: 5.0000 mg | ORAL_TABLET | ORAL | 0 refills | Status: DC | PRN

## 2023-12-10 MED ORDER — AMIODARONE HCL 200 MG PO TABS: 200.0000 mg | ORAL_TABLET | Freq: Two times a day (BID) | ORAL | Status: DC

## 2023-12-10 MED ORDER — HALOPERIDOL LACTATE 2 MG/ML PO CONC: 2.0000 mg | ORAL | 0 refills | Status: DC | PRN

## 2023-12-10 MED ORDER — LORAZEPAM 2 MG/ML PO CONC: 1.0000 mg | ORAL | 0 refills | Status: DC | PRN

## 2023-12-10 MED ORDER — NYSTATIN 100000 UNIT/ML MT SUSP: 5.0000 mL | Freq: Four times a day (QID) | OROMUCOSAL | Status: DC

## 2023-12-10 NOTE — Progress Notes (Signed)
 [facility] 3E28 - Civil engineer, contracting Hospice hospital liaison note   Received request from [facility] for family interest in hospice inpatient unit. Visited with family at bedside and discussed inpatient hospice care and hospice philosophy.  Daughter reports that patient is feeling better than she has in a long time. They cite that she is eating some for the first time in 10 days and is showing interest in planning what she is eating later today. They know this is temporary but would like to wait to see if she declines before attempting to move to [facility] inpatient hospice care.    Discussed that hospital liaisons will continue to assess for decline and be available as needed. Inpatient team updated with this information.    Thank you for the opportunity to participate in this patient's care.    Elouise Husband, BSN, RN, [facility] hospital liaison [phone]

## 2023-12-10 NOTE — Progress Notes (Signed)
 PROGRESS NOTE  Tammy Wolfe FMW:[id number] DOB: 08-06-1940 DOA: 11/27/2023 PCP: [name] ORN, MD   LOS: 13 days   Brief Narrative / Interim history: PMH of breast cancer, COPD, HTN, HLD, history of alcohol  abuse and pancreatitis presented to the hospital secondary to abnormal CT findings.  Has shortness of breath for 3 weeks.  Underwent CT scan of the chest outpatient and was found to have pericardial effusion as well as possible lung mass with pleural effusion. Admitted to the ICU.  Cardiology was consulted. 7/4 underwent thoracentesis on right side. 7/7 underwent pericardial drain placement.  Developed A-fib with RVR. 7/9 pericardial drain removed.  Oncology was consulted as the cytology came back positive for pericardial fluid for malignancy. 7/11 transfer to hospitalist service. 7/14.  Repeat thoracentesis performed. 7/15.  After conversation with the patient, currently comfort care.  Subjective / 24h Interval events: She reports that she is feeling better today, anxious at times but medications are working  Assesement and Plan: Principal Problem:   Pericardial effusion Active Problems:   Pleural effusion, bilateral   Recent unexplained weight loss   Hoarse voice quality   Cigarette smoker   Chronic obstructive pulmonary disease with acute exacerbation (HCC)   HX: breast cancer   Acute respiratory failure with hypoxia (HCC)   Mediastinal mass   Pleural effusion   Protein-calorie malnutrition, severe   Cachexia (HCC)   Pressure injury of skin   Hospice care patient   Principal problem 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  Underwent thoracentesis as well as pericardiocentesis, and cytology of the pericardial fluid came back positive for malignancy, although tissue was not enough to send provide medication or further testing.  She has stage IV  malignancy, has very poor prognosis given poor nutritional status, not a candidate for standard chemotherapy.  Palliative care consulted, she has not been transitioned to comfort. -she would qualify for residential hospice however since she is feeling better, family would like to see how she does before and if they transition to residential hospice  Active problems  New onset atrial fibrillation with RVR - On amiodarone .  Not a candidate for anticoagulation.    Acute respiratory failure with hypoxia. History of COPD/severe emphysema. Recurrent right effusions -Treated with Lasix . Currently needing high flow, 7 L.  Was on IV antibiotics, now comfort care  Severe protein calorie malnutrition. Body mass index is 18.12 kg/m - Placing the patient at a high risk for poor outcome. Likely in the setting of malignancy.   AKI, hyponatremia - Received diuresis.  Serum creatinine improved to 1.3 and worsened again to 1.6. Avoid nephrotoxic medication. Sodium level has been on the lower side. Now comfort care.   Goals of care conversation - Palliative care following. Prognosis is poor in the setting of stage IV malignancy with with poor performance status. Not a candidate for chemotherapy.   Possible ileus, gastric distention -symptomatic management  Scheduled Meds:  ALPRAZolam   0.25 mg Oral BID   amiodarone   200 mg Oral BID   arformoterol   15 mcg Nebulization BID   budesonide  (PULMICORT ) nebulizer solution  0.5 mg Nebulization BID   Chlorhexidine  Gluconate Cloth  6 each Topical Daily   feeding supplement  237 mL Oral TID BM   guaiFENesin   600 mg Oral BID   nystatin   5 mL Oral QID   polyethylene glycol  17 g Oral Daily   revefenacin   175 mcg Nebulization  Daily   simethicone   80 mg Oral QID   sodium chloride  flush  3 mL Intravenous Q12H   Continuous Infusions: PRN Meds:.acetaminophen  **OR** acetaminophen , antiseptic oral rinse, artificial tears, bisacodyl , glycopyrrolate  **OR** glycopyrrolate   **OR** glycopyrrolate , haloperidol  **OR** haloperidol  **OR** haloperidol  lactate, HYDROmorphone  (DILAUDID ) injection, ipratropium-albuterol , lip balm, LORazepam  **OR** midazolam , methocarbamol  (ROBAXIN ) injection, midazolam , ondansetron  **OR** ondansetron  (ZOFRAN ) IV, oxyCODONE   Current Outpatient Medications  Medication Instructions   acetaminophen  (TYLENOL ) 650 mg, Every 6 hours PRN   albuterol  (VENTOLIN  HFA) 108 (90 Base) MCG/ACT inhaler 2 puffs, Inhalation, Every 6 hours PRN   ALPRAZolam  (XANAX ) 0.25 MG tablet TAKE ONE TABLET THREE TIMES DAILY AS NEEDED   amiodarone  (PACERONE ) 200 mg, Oral, 2 times daily   cyanocobalamin  1,000 mcg, Daily   cyclobenzaprine  (FLEXERIL ) 5 MG tablet 1 tab by mouth at bedtime as needed for cramps and back pain   Fluticasone-Umeclidin-Vilant (TRELEGY ELLIPTA ) 100-62.5-25 MCG/ACT AEPB 1 puff, Inhalation, Daily   haloperidol  (HALDOL ) 2 mg, Sublingual, Every 4 hours PRN   HYDROcodone -acetaminophen  (NORCO/VICODIN) 5-325 MG tablet 1 tablet, Oral, 2 times daily PRN   LORazepam  (ATIVAN ) 1 mg, Oral, Every 4 hours PRN   losartan  (COZAAR ) 100 mg, Oral, Daily   nystatin  (MYCOSTATIN ) 500,000 Units, Oral, 4 times daily   oxyCODONE  (OXY IR/ROXICODONE ) 5 mg, Oral, Every 4 hours PRN   predniSONE  (DELTASONE ) 10 MG tablet 3 tabs by mouth per day for 3 days,2tabs per day for 3 days,1tab per day for 3 days   trimethoprim  (TRIMPEX ) 100 mg, Oral, Daily   Vitamin D  1,000 Units, Daily    Diet Orders (From admission, onward)     Start     Ordered   12/01/23 1302  Diet regular Room service appropriate? Yes with Assist; Fluid consistency: Thin  Diet effective now       Question Answer Comment  Room service appropriate? Yes with Assist   Fluid consistency: Thin      12/01/23 1301            DVT prophylaxis:    Lab Results  Component Value Date   PLT 413 (H) 12/08/2023      Code Status: Do not attempt resuscitation (DNR) - Comfort care  Family Communication: no  family at bedside   Status is: Inpatient Remains inpatient appropriate because: severity of illness  Level of care: Med-Surg  Consultants:  Palliative Cardiology   Objective: Vitals:   12/09/23 0928 12/09/23 1947 12/09/23 2051 12/10/23 0943  BP:   131/62 104/60  Pulse:  90 (!) 114 95  Resp:  16 15 16   Temp:   97.8 F (36.6 C)   TempSrc:   Oral   SpO2: 95% 94% 94%   Weight:      Height:        Intake/Output Summary (Last 24 hours) at 12/10/2023 1304 Last data filed at 12/10/2023 0519 Gross per 24 hour  Intake 290 ml  Output 350 ml  Net -60 ml   Wt Readings from Last 3 Encounters:  12/09/23 44.2 kg  11/23/23 36.3 kg  11/10/23 37.6 kg    Examination:  Constitutional: tachypneic Eyes: lids and conjunctivae normal, no scleral icterus ENMT: mmm Neck: normal, supple Respiratory: diminished breath sounds overall, no wheezing Cardiovascular: Regular rate and rhythm, no murmurs / rubs / gallops. No LE edema. Abdomen: soft, no distention, no tenderness. Bowel sounds positive.   Data Reviewed: I have independently reviewed following labs and imaging studies   CBC Recent Labs  Lab 12/04/23 0244 12/05/23 0810  12/06/23 0335 12/07/23 0314 12/08/23 0253  WBC 9.6 10.5 12.1* 9.3 9.8  HGB 11.7* 11.0* 10.5* 10.2* 11.6*  HCT 38.1 35.0* 33.2* 33.0* 37.7  PLT 356 362 347 339 413*  MCV 85.0 83.9 82.8 82.9 83.8  MCH 26.1 26.4 26.2 25.6* 25.8*  MCHC 30.7 31.4 31.6 30.9 30.8  RDW 15.6* 15.4 15.2 15.2 15.4    Recent Labs  Lab 12/04/23 0244 12/05/23 0241 12/05/23 1449 12/06/23 0335 12/06/23 2035 12/07/23 0314 12/08/23 0253  NA 128* 126* 128* 128* 129* 132* 132*  K 4.9 4.8 3.7 4.6 5.1 4.7 4.8  CL 93* 91* 101 95* 100 102 99  CO2 22 32 17* 24 20* 23 23  GLUCOSE 107* 116* 143* 134* 128* 105* 141*  BUN 85* 95* 78* 93* 85* 74* 72*  CREATININE 1.58* 1.99* 1.46* 1.71* 1.42* 1.34* 1.51*  CALCIUM 9.7 10.2 7.7* 10.1 10.3 10.3 12.1*  MG 2.9* 3.2*  --  3.4*  --  3.3* 3.6*     ------------------------------------------------------------------------------------------------------------------ No results for input(s): CHOL, HDL, LDLCALC, TRIG, CHOLHDL, LDLDIRECT in the last 72 hours.  Lab Results  Component Value Date   HGBA1C 6.5 11/23/2023   ------------------------------------------------------------------------------------------------------------------ No results for input(s): TSH, T4TOTAL, T3FREE, THYROIDAB in the last 72 hours.  Invalid input(s): FREET3  Cardiac Enzymes No results for input(s): CKMB, TROPONINI, MYOGLOBIN in the last 168 hours.  Invalid input(s): CK ------------------------------------------------------------------------------------------------------------------    Component Value Date/Time   BNP 60.1 11/27/2023 1430    CBG: No results for input(s): GLUCAP in the last 168 hours.  Recent Results (from the past 240 hours)  Culture, body fluid w Gram Stain-bottle     Status: None   Collection Time: 11/30/23  2:09 PM   Specimen: Path fluid  Result Value Ref Range Status   Specimen Description FLUID  Final   Special Requests NONE  Final   Culture   Final    NO GROWTH 5 DAYS Performed at [facility], [address] [place], [place] [address]    Report Status 12/05/2023 FINAL  Final  Gram stain     Status: None   Collection Time: 11/30/23  2:09 PM   Specimen: Path fluid  Result Value Ref Range Status   Specimen Description FLUID  Final   Special Requests NONE  Final   Gram Stain   Final    RARE WBC PRESENT, PREDOMINANTLY MONONUCLEAR NO ORGANISMS SEEN Performed at [facility], [address], [place], [place] [address]    Report Status 11/30/2023 FINAL  Final  MRSA Next Gen by PCR, Nasal     Status: None   Collection Time: 11/30/23  4:17 PM   Specimen: Nasal Mucosa; Nasal Swab  Result Value Ref Range Status   MRSA by PCR Next Gen NOT DETECTED NOT DETECTED Final    Comment:  (NOTE) The GeneXpert MRSA Assay (FDA approved for NASAL specimens only), is one component of a comprehensive MRSA colonization surveillance program. It is not intended to diagnose MRSA infection nor to guide or monitor treatment for MRSA infections. Test performance is not FDA approved in patients less than 89 years old. Performed at [facility], [address], [place], [place] [address]      Radiology Studies: No results found.   Nilda Fendt, MD, PhD [facility]  Between 7 am - 7 pm I am available, please contact me via Amion (for emergencies) or Securechat (non urgent messages)  Between 7 pm - 7 am I  am not available, please contact night coverage MD/APP via Amion

## 2023-12-10 NOTE — Progress Notes (Signed)
 Patient did not want oral temperature to be obtained during this time. Patient is on comfort care measures. RN verbalize understanding. Patient relayed that they are comfortable and does not want to be turned.

## 2023-12-10 NOTE — TOC Progression Note (Addendum)
 Transition of Care [facility]) - Progression Note    Patient Details  Name: Tammy Wolfe MRN: [id number] Date of Birth: [birth year]  Transition [facility]) CM/SW Contact  Luise JAYSON Pan, [place] Phone Number: 12/10/2023, 10:28 AM  Clinical Narrative:   [facility] consult placed on 7/17 that family is interested in [facility] for Residential hospice. CSW initiated referral to [facility] for [facility]. Awaiting eval and bed availability.   12:01 PM Per [facility], they have met with family and family expressed patient showing interest in eating. Family would like to see if patient progresses before moving forward with decision to go to [facility].   TOC will continue to follow.    Expected Discharge Plan: Hospice Medical Facility Barriers to Discharge: Continued Medical Work up  Expected Discharge Plan and Services In-house Referral: NA Discharge Planning Services: CM Consult Post Acute Care Choice: Home Health Living arrangements for the past 2 months:  (Condo)                   DME Agency: NA                   Social Determinants of Health (SDOH) Interventions SDOH Screenings   Food Insecurity: No Food Insecurity (11/27/2023)  Housing: Low Risk  (11/27/2023)  Transportation Needs: No Transportation Needs (11/27/2023)  Utilities: Not At Risk (11/27/2023)  Alcohol  Screen: Low Risk  (04/21/2023)  Depression (PHQ2-9): Low Risk  (11/23/2023)  Financial Resource Strain: Low Risk  (04/21/2023)  Physical Activity: Inactive (04/21/2023)  Social Connections: Socially Isolated (11/29/2023)  Stress: No Stress Concern Present (04/21/2023)  Tobacco Use: Medium Risk (11/27/2023)  Health Literacy: Adequate Health Literacy (04/21/2023)    Readmission Risk Interventions     No data to display

## 2023-12-10 NOTE — Discharge Summary (Incomplete)
 Physician Discharge Summary  Tammy Wolfe FMW:[id number] DOB: 07/13/1940 DOA: 11/27/2023  PCP: [name] ORN, MD  Admit date: 11/27/2023 Discharge date: 12/10/2023  Admitted From: home Disposition: Residential hospice   Discharge Condition: stable CODE STATUS: DNR Diet Orders (From admission, onward)     Start     Ordered   12/01/23 1302  Diet regular Room service appropriate? Yes with Assist; Fluid consistency: Thin  Diet effective now       Question Answer Comment  Room service appropriate? Yes with Assist   Fluid consistency: Thin      12/01/23 1301            Brief Narrative / Interim history: PMH of breast cancer, COPD, HTN, HLD, history of alcohol  abuse and pancreatitis presented to the hospital secondary to abnormal CT findings.  Has shortness of breath for 3 weeks.  Underwent CT scan of the chest outpatient and was found to have pericardial effusion as well as possible lung mass with pleural effusion. Admitted to the ICU.  Cardiology was consulted. 7/4 underwent thoracentesis on right side. 7/7 underwent pericardial drain placement.  Developed A-fib with RVR. 7/9 pericardial drain removed.  Oncology was consulted as the cytology came back positive for pericardial fluid for malignancy. 7/11 transfer to hospitalist service. 7/14.  Repeat thoracentesis performed. 7/15.  After conversation with the patient, currently comfort care.  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  Underwent thoracentesis as well as pericardiocentesis, and cytology of the pericardial fluid came back positive for malignancy, although tissue was not enough to send provide medication or further testing.  She has stage IV malignancy, has very poor prognosis given poor nutritional status, not a candidate for standard chemotherapy.  Palliative care consulted, she has not been transitioned to comfort.     Active problems  New onset atrial fibrillation with RVR - On amiodarone .  Not a candidate for anticoagulation.  Acute respiratory failure with hypoxia. History of COPD/severe emphysema. Recurrent right effusions -Treated with Lasix . Currently needing high flow, 7 L.  Was on IV antibiotics, now comfort care Severe protein calorie malnutrition. Body mass index is 18.12 kg/m - Placing the patient at a high risk for poor outcome. Likely in the setting of malignancy. AKI, hyponatremia  Possible ileus, gastric distention -symptomatic management, improved, tolerating a diet  Sepsis ruled out   Discharge Instructions   Allergies as of 12/10/2023       Reactions   Tetracycline Anaphylaxis   Lovastatin  Other (See Comments)   Muscle cramps   Pravastatin  Other (See Comments)   Leg cramps        Medication List     STOP taking these medications    ALPRAZolam  0.25 MG tablet Commonly known as: XANAX    azithromycin  250 MG tablet Commonly known as: ZITHROMAX    cyanocobalamin  1000 MCG tablet   HYDROcodone  bit-homatropine 5-1.5 MG/5ML syrup Commonly known as: HYCODAN  HYDROcodone -acetaminophen  5-325 MG tablet Commonly known as: NORCO/VICODIN   losartan  100 MG tablet Commonly known as: COZAAR    predniSONE  10 MG tablet Commonly known as: DELTASONE    trimethoprim  100 MG tablet Commonly known as: TRIMPEX        TAKE these medications     acetaminophen  325 MG tablet Commonly known as: TYLENOL  Take 650 mg by mouth every 6 (six) hours as needed for mild pain (pain score 1-3).   albuterol  108 (90 Base) MCG/ACT inhaler Commonly known as: VENTOLIN  HFA Inhale 2 puffs into the lungs every 6 (six) hours as needed for wheezing or shortness of breath.   amiodarone  200 MG tablet Commonly known as: PACERONE  Take 1 tablet (200 mg total) by mouth 2 (two) times daily.   cyclobenzaprine  5 MG tablet Commonly known as: FLEXERIL  1 tab by mouth at bedtime as needed for cramps and back pain   haloperidol  2 MG/ML solution Commonly known as: HALDOL  Place 1 mL (2 mg total) under the tongue every 4 (four) hours as needed for agitation (or delirium).   LORazepam  2 MG/ML concentrated solution Commonly known as: ATIVAN  Take 0.5 mLs (1 mg total) by mouth every 4 (four) hours as needed for anxiety.   nystatin  100000 UNIT/ML suspension Commonly known as: MYCOSTATIN  Take 5 mLs (500,000 Units total) by mouth 4 (four) times daily.   oxyCODONE  5 MG immediate release tablet Commonly known as: Oxy IR/ROXICODONE  Take 1 tablet (5 mg total) by mouth every 4 (four) hours as needed for moderate pain (pain score 4-6) (or dyspnea).   Trelegy Ellipta  100-62.5-25 MCG/ACT Aepb Generic drug: Fluticasone-Umeclidin-Vilant Inhale 1 puff into the lungs daily.   Vitamin D  1000 units capsule Take 1,000 Units by mouth daily.        Consultations: Cardiology  Palliative   Procedures/Studies:  DG Abd Portable 1V Result Date: 12/08/2023 CLINICAL DATA:  Constipation EXAM: PORTABLE ABDOMEN - 1 VIEW COMPARISON:  12/06/2023 FINDINGS: Mild gaseous distention of the stomach. Large and small bowel nondilated. No organomegaly or free air. Visualized lung bases clear. IMPRESSION: Mild gaseous distention of the stomach. No evidence of bowel obstruction or free air. Electronically Signed   By: Franky Crease M.D.   On: 12/08/2023 12:39   IR THORACENTESIS ASP  PLEURAL SPACE W/IMG GUIDE Result Date: 12/07/2023 INDICATION: 83 year old female with female previous medical history of breast cancer, with malignant pleural and pericardial effusion, shortness of breath. IR requested for diagnostic and therapeutic right-sided thoracentesis. EXAM: ULTRASOUND GUIDED DIAGNOSTIC AND THERAPEUTIC RIGHT-SIDED THORACENTESIS MEDICATIONS: 6 cc of 1% lidocaine  COMPLICATIONS: None immediate. PROCEDURE: An ultrasound guided thoracentesis was thoroughly discussed with the patient and questions answered. The benefits, risks, alternatives and complications were also discussed. The patient understands and wishes to proceed with the procedure. Written consent was obtained. Ultrasound was performed to localize and mark an adequate pocket of fluid in the right chest. The area was then prepped and draped in the normal sterile fashion. 1% Lidocaine  was used for local anesthesia. Under ultrasound guidance a 6 Fr Safe-T-Centesis catheter was introduced. Thoracentesis was performed. The catheter was removed and a dressing applied. FINDINGS: A total of approximately 300 mL of clear, straw-colored pleural fluid was removed. Samples were sent to the laboratory as requested by the clinical team. IMPRESSION: Successful ultrasound guided right thoracentesis yielding 300 mL of pleural fluid. Procedure performed by Carlin Griffon, PA-C Electronically Signed   By: Juliene Balder M.D.   On: 12/07/2023 15:31   DG CHEST PORT 1 VIEW Result Date: 12/07/2023 CLINICAL  DATA:  Recurrent right pleural effusion EXAM: PORTABLE CHEST 1 VIEW COMPARISON:  Chest x-ray 12/05/2023 and older FINDINGS: Persistent right-sided pleural effusion with adjacent opacity. Persistent left retrocardiac opacity. Hyperinflation with chronic lung changes. Apical pleural thickening. Stable cardiopericardial silhouette with tortuous ectatic aorta. No pneumothorax. Overlapping cardiac leads. Diffuse degenerative changes. Surgical clips along the left  axillary region. IMPRESSION: No significant interval change when adjusted for technique. Electronically Signed   By: Ranell Bring M.D.   On: 12/07/2023 14:10   DG Chest 1 View Result Date: 12/07/2023 CLINICAL DATA:  Thoracentesis EXAM: CHEST  1 VIEW COMPARISON:  Chest x-ray performed December 07, 2023 FINDINGS: Small right residual pleural effusion. No pneumothorax. Mild central congestion. IMPRESSION: 1. No pneumothorax following thoracentesis. Electronically Signed   By: Maude Naegeli M.D.   On: 12/07/2023 13:55   DG Abd Portable 1V Result Date: 12/06/2023 CLINICAL DATA:  Gastric outlet obstruction. EXAM: PORTABLE ABDOMEN - 1 VIEW COMPARISON:  December 05, 2023 and prior studies FINDINGS: Interval decrease in gaseous distention of the stomach. There is persistent gaseous distension of small and large bowel loops, slightly improved from prior. IMPRESSION: Interval decrease in gaseous distention of the stomach. Multiple gas distended loops of small and large bowel, may be related to adynamic ileus, slightly improved from prior. Electronically Signed   By: Michaeline Blanch M.D.   On: 12/06/2023 15:43   DG CHEST PORT 1 VIEW Result Date: 12/05/2023 CLINICAL DATA:  Shortness of breath. EXAM: PORTABLE CHEST 1 VIEW COMPARISON:  Radiograph 12/03/2023 FINDINGS: Increasing volume loss at the right lung base with ill-defined opacity. Again seen medial left lung base atelectasis. Technically limited due to patient rotation. Stable heart size and mediastinal contours. There may be a right pleural effusion. No pneumothorax. IMPRESSION: 1. Increasing volume loss at the right lung base with ill-defined opacity, may represent atelectasis/partial lobar collapse or pneumonia. Possible right pleural effusion. 2. Medial left lung base atelectasis. Electronically Signed   By: Andrea Gasman M.D.   On: 12/05/2023 16:07   DG Abd Portable 1V Result Date: 12/05/2023 CLINICAL DATA:  Constipation. EXAM: PORTABLE ABDOMEN - 1 VIEW COMPARISON:   None Available. FINDINGS: Portable supine view of the abdomen obtained. The low pelvis and upper most abdomen are not included in the field of view. There is a moderate volume of stool throughout the colon. Suspected stool within the rectum, although not entirely included in the field of view. Gaseous gastric distention in the upper abdomen. Increased air within small bowel centrally. No obvious radiopaque calculi. IMPRESSION: 1. Moderate colonic stool burden. 2. Gaseous gastric distention in the upper abdomen. Increased air within small bowel centrally. This may represent ileus. Electronically Signed   By: Andrea Gasman M.D.   On: 12/05/2023 16:05   ECHOCARDIOGRAM LIMITED Result Date: 12/03/2023    ECHOCARDIOGRAM LIMITED REPORT   Patient Name:   CARAN STORCK Date of Exam: 12/03/2023 Medical Rec #:  [id number]    Height:       59.0 in Accession #:    [id number]   Weight:       75.4 lb Date of Birth:  [birth year]     BSA:          1.218 m Patient Age:    83 years     BP:           107/87 mmHg Patient Gender: F            HR:  80 bpm. Exam Location:  Inpatient Procedure: Limited Echo (Both Spectral and Color Flow Doppler were utilized            during procedure). Indications:    Pericardial Effusion  History:        Patient has prior history of Echocardiogram examinations, most                 recent 12/01/2023. Pericardial Disease, COPD; Risk                 Factors:Hypertension.  Sonographer:    Jayson Gaskins Referring Phys: [phone] LINDSAY NICOLE FINCH IMPRESSIONS  1. Left ventricular ejection fraction, by estimation, is 70 to 75%. The left ventricle has hyperdynamic function. The left ventricle has no regional wall motion abnormalities. There is moderate asymmetric left ventricular hypertrophy of the basal-septal  segment. Left ventricular diastolic function could not be evaluated.  2. Right ventricular systolic function is normal. The right ventricular size is normal.  3. No significant effusion.  4. The  mitral valve is grossly normal.  5. The aortic valve is tricuspid. There is mild calcification of the aortic valve. Aortic valve sclerosis/calcification is present, without any evidence of aortic stenosis.  6. The inferior vena cava is normal in size with greater than 50% respiratory variability, suggesting right atrial pressure of 3 mmHg. FINDINGS  Left Ventricle: Left ventricular ejection fraction, by estimation, is 70 to 75%. The left ventricle has hyperdynamic function. The left ventricle has no regional wall motion abnormalities. The left ventricular internal cavity size was normal in size. There is moderate asymmetric left ventricular hypertrophy of the basal-septal segment. Left ventricular diastolic function could not be evaluated. Right Ventricle: The right ventricular size is normal. No increase in right ventricular wall thickness. Right ventricular systolic function is normal. Left Atrium: Left atrial size was normal in size. Right Atrium: Right atrial size was normal in size. Pericardium: No significant effusion. There is no evidence of pericardial effusion. Mitral Valve: The mitral valve is grossly normal. Aortic Valve: The aortic valve is tricuspid. There is mild calcification of the aortic valve. Aortic valve sclerosis/calcification is present, without any evidence of aortic stenosis. Aorta: The aortic root was not well visualized. Venous: The left upper pulmonary vein is normal. The inferior vena cava is normal in size with greater than 50% respiratory variability, suggesting right atrial pressure of 3 mmHg. IAS/Shunts: The interatrial septum was not assessed. LEFT VENTRICLE PLAX 2D LVIDd:         2.50 cm LVIDs:         1.80 cm LV PW:         0.80 cm LV IVS:        0.70 cm  LEFT ATRIUM             Index LA Vol (A2C):   18.4 ml 15.10 ml/m LA Vol (A4C):   24.7 ml 20.27 ml/m LA Biplane Vol: 21.5 ml 17.65 ml/m Toribio Fuel MD Electronically signed by Toribio Fuel MD Signature Date/Time:  12/03/2023/6:36:38 PM    Final    DG Chest Port 1 View Result Date: 12/03/2023 CLINICAL DATA:  Pleural effusion. EXAM: PORTABLE CHEST 1 VIEW COMPARISON:  Chest pain and dated 12/01/2023. FINDINGS: Background of emphysema. Left lung base atelectasis. No focal consolidation, pleural effusion, or pneumothorax. Stable cardiac silhouette. No acute osseous pathology. IMPRESSION: Left lung base atelectasis. No focal consolidation. Electronically Signed   By: Vanetta Chou M.D.   On: 12/03/2023 16:52   ECHOCARDIOGRAM LIMITED  Result Date: 12/01/2023    ECHOCARDIOGRAM LIMITED REPORT   Patient Name:   Tammy Wolfe Date of Exam: 12/01/2023 Medical Rec #:  [id number]    Height:       59.0 in Accession #:    [id number]   Weight:       80.0 lb Date of Birth:  08-01-1940     BSA:          1.249 m Patient Age:    83 years     BP:           108/70 mmHg Patient Gender: F            HR:           114 bpm. Exam Location:  Inpatient Procedure: Limited Color Doppler and Cardiac Doppler (Both Spectral and Color            Flow Doppler were utilized during procedure). Indications:    Pericardial effusion  History:        Patient has prior history of Echocardiogram examinations, most                 recent 11/30/2023.  Sonographer:    Benard Stallion Referring Phys: LONNI END IMPRESSIONS  1. LVOT flow acceleration.. Left ventricular ejection fraction, by estimation, is 70 to 75%. The left ventricle has hyperdynamic function. There is severe asymmetric left ventricular hypertrophy of the basal-septal segment. Indeterminate diastolic filling due to E-A fusion.  2. Right ventricular systolic function is hyperdynamic. The right ventricular size is normal.  3. The mitral valve is abnormal. No evidence of mitral valve regurgitation. No evidence of mitral stenosis. There is mild holosystolic prolapse of both leaflets of the mitral valve.  4. The aortic valve is tricuspid. Aortic valve regurgitation is not visualized. No aortic stenosis is  present. Comparison(s): Resolution of pericardial effusion. FINDINGS  Left Ventricle: LVOT flow acceleration. Left ventricular ejection fraction, by estimation, is 70 to 75%. The left ventricle has hyperdynamic function. There is severe asymmetric left ventricular hypertrophy of the basal-septal segment. Indeterminate diastolic filling due to E-A fusion. Right Ventricle: The right ventricular size is normal. No increase in right ventricular wall thickness. Right ventricular systolic function is hyperdynamic. Pericardium: There is no evidence of pericardial effusion. Mitral Valve: The mitral valve is abnormal. There is mild holosystolic prolapse of both leaflets of the mitral valve. No evidence of mitral valve stenosis. Tricuspid Valve: The tricuspid valve is normal in structure. Tricuspid valve regurgitation is not demonstrated. No evidence of tricuspid stenosis. Aortic Valve: The aortic valve is tricuspid. Aortic valve regurgitation is not visualized. No aortic stenosis is present. Aortic valve mean gradient measures 2.5 mmHg. Aortic valve peak gradient measures 4.7 mmHg. Aortic valve area, by VTI measures 3.35 cm. Pulmonic Valve: The pulmonic valve was not well visualized. Pulmonic valve regurgitation is not visualized. No evidence of pulmonic stenosis. Aorta: The aortic root is normal in size and structure. LEFT VENTRICLE PLAX 2D LVIDd:         3.00 cm LVIDs:         1.90 cm LV PW:         0.90 cm LV IVS:        0.80 cm LVOT diam:     2.00 cm LV SV:         51 LV SV Index:   40 LVOT Area:     3.14 cm  RIGHT VENTRICLE RV S prime:     19.00 cm/s TAPSE (M-mode):  1.3 cm LEFT ATRIUM         Index LA diam:    2.00 cm 1.60 cm/m  AORTIC VALVE AV Area (Vmax):    3.16 cm AV Area (Vmean):   3.15 cm AV Area (VTI):     3.35 cm AV Vmax:           108.50 cm/s AV Vmean:          72.000 cm/s AV VTI:            0.151 m AV Peak Grad:      4.7 mmHg AV Mean Grad:      2.5 mmHg LVOT Vmax:         109.00 cm/s LVOT Vmean:         72.200 cm/s LVOT VTI:          0.161 m LVOT/AV VTI ratio: 1.07  AORTA Ao Root diam: 3.10 cm MITRAL VALVE MV Area (PHT): 6.54 cm     SHUNTS MV Decel Time: 116 msec     Systemic VTI:  0.16 m MV E velocity: 55.50 cm/s   Systemic Diam: 2.00 cm MV A velocity: 109.00 cm/s MV E/A ratio:  0.51 Stanly Leavens MD Electronically signed by Stanly Leavens MD Signature Date/Time: 12/01/2023/9:45:42 AM    Final    DG CHEST PORT 1 VIEW Result Date: 12/01/2023 CLINICAL DATA:  Pleural effusion. EXAM: PORTABLE CHEST 1 VIEW COMPARISON:  11/30/2023 FINDINGS: Trace left pleural effusion. No pulmonary edema or focal consolidation. Interstitial markings are diffusely coarsened with chronic features. Cardiopericardial silhouette is at upper limits of normal for size. There is a small bore catheter overlying the left heart border similar to prior. IMPRESSION: Trace left pleural effusion. Electronically Signed   By: Camellia Candle M.D.   On: 12/01/2023 07:54   DG CHEST PORT 1 VIEW Result Date: 11/30/2023 CLINICAL DATA:  [id number] S/P pericardiocentesis [id number] EXAM: PORTABLE CHEST 1 VIEW COMPARISON:  11/30/2023, 5:42 a.m. FINDINGS: Bilateral lungs appear hyperlucent with coarse bronchovascular markings, in keeping with COPD. Bilateral lungs otherwise appear clear. No dense consolidation or lung collapse. Bilateral costophrenic angles are clear. Stable cardio-mediastinal silhouette. Since the prior study, there is new tubing overlying the left heart shadow, which may represent pericardial drainage catheter. Correlate with history. No pneumomediastinum noted. No acute osseous abnormalities. The soft tissues are within normal limits. IMPRESSION: No active disease. COPD. Electronically Signed   By: Ree Molt M.D.   On: 11/30/2023 15:59   ECHOCARDIOGRAM LIMITED Result Date: 11/30/2023    ECHOCARDIOGRAM LIMITED REPORT   Patient Name:   Tammy Wolfe Date of Exam: 11/30/2023 Medical Rec #:  [id number]    Height:       59.0 in Accession  #:    [id number]   Weight:       80.0 lb Date of Birth:  [birth year]     BSA:          1.249 m Patient Age:    83 years     BP:           141/71 mmHg Patient Gender: F            HR:           127 bpm. Exam Location:  Inpatient Procedure: Limited Echo (Both Spectral and Color Flow Doppler were utilized            during procedure). Indications:    I31.3 Pericardial effusion (noninflammatory)  History:  Patient has prior history of Echocardiogram examinations, most                 recent 11/28/2023. Abnormal ECG, COPD, Arrythmias:Tachycardia,                 Signs/Symptoms:Shortness of Breath and Dyspnea; Risk                 Factors:Current Smoker. Pericardial effusion. Early tamponade.                 H/O breast cancer. ETOH.  Sonographer:    Ellouise Mose RDCS Referring Phys: [phone] CHRISTOPHER END  Sonographer Comments: Image acquisition challenging due to mastectomy. Pericardiocentesis procedure. Patient with extremely thin habitus. IMPRESSIONS  1. Left ventricular ejection fraction, by estimation, is 60 to 65%. The left ventricle has normal function.  2. Moderate pericardial effusion slightly smaller than effusion seen on TTE 11/28/23 no evidence of tamponade . Moderate pericardial effusion. FINDINGS  Left Ventricle: Left ventricular ejection fraction, by estimation, is 60 to 65%. The left ventricle has normal function. The left ventricular internal cavity size was normal in size. Pericardium: Moderate pericardial effusion slightly smaller than effusion seen on TTE 11/28/23 no evidence of tamponade. A moderately sized pericardial effusion is present. Maude Emmer MD Electronically signed by Maude Emmer MD Signature Date/Time: 11/30/2023/11:17:08 AM    Final    CARDIAC CATHETERIZATION Result Date: 11/30/2023 Conclusions: Moderate-sized pericardial effusion by preprocedure bedside echocardiogram. Successful pericardiocentesis and pericardial drain placement from a subxiphoid approach, yielding 225 mL of serous  (straw-colored) fluid. Recommendations: Maintain drain to negative pressure with attached negative pressure drain.  Record output every 4 hours. Perform chest radiograph to exclude pneumothorax and confirm placement. Repeat limited echocardiogram tomorrow morning to assess for reaccumulation of pericardial effusion.  If no significant reaccumulation and drain output less than 100 mL in 24 hours, drain can be removed. Lonni Hanson, MD [facility]  DG CHEST PORT 1 VIEW Result Date: 11/30/2023 CLINICAL DATA:  Pleural effusion EXAM: PORTABLE CHEST 1 VIEW COMPARISON:  Prior chest x-ray 11/29/2023 FINDINGS: Cardiac and mediastinal contours are unchanged. Similar appearance of soft tissue density within the mediastinum. Atherosclerotic calcifications again noted in the transverse aorta. Similar appearance of biapical pleuroparenchymal scarring. The lungs appear hyperinflated with diffuse bronchitic changes and areas of lucency likely reflecting underlying emphysema. Trace left pleural effusion. No pneumothorax. IMPRESSION: 1. Trace left pleural effusion. 2. Similar appearance of the lungs which appear hyperinflated with coarse bronchitic changes and areas of lucency suggesting a combination of emphysema and chronic bronchitis. 3. Aortic atherosclerotic vascular calcifications. 4. Similar appearance of increased soft tissue density within the upper mediastinum. Electronically Signed   By: Wilkie Lent M.D.   On: 11/30/2023 07:48   US  THORACENTESIS ASP PLEURAL SPACE W/IMG GUIDE Result Date: 11/29/2023 INDICATION: 83 year old female with history of COPD presented with shortness of breast. Previous imaging showed bilateral pleural effusion. Request for therapeutic and diagnostic thoracentesis. EXAM: ULTRASOUND GUIDED RIGHT THORACENTESIS MEDICATIONS: 5 mL 1% lidocaine  COMPLICATIONS: SIR Level A - No therapy, no consequence. PROCEDURE: An ultrasound guided thoracentesis was thoroughly discussed with the patient  and questions answered. The benefits, risks, alternatives and complications were also discussed. The patient understands and wishes to proceed with the procedure. Written consent was obtained. Ultrasound was performed to localize and mark an adequate pocket of fluid in the right chest. The area was then prepped and draped in the normal sterile fashion. 1% Lidocaine  was used for local anesthesia. Under ultrasound guidance a  6 Fr Safe-T-Centesis catheter was introduced. Thoracentesis was performed. The catheter was removed and a dressing applied. FINDINGS: A total of approximately 400 mL of hazy amber fluid was removed. Samples were sent to the laboratory as requested by the clinical team. Post-procedure chest x-ray showed small apical right pneumothorax. Chest x-ray was repeated in 1 hour, which showed resolution of the apical pneumothorax. IMPRESSION: Successful ultrasound guided right thoracentesis yielding 400 mL of pleural fluid. Performed by: Aimee Han, PA-C Electronically Signed   By: Cordella Banner   On: 11/29/2023 16:07   DG Chest Port 1 View Result Date: 11/29/2023 CLINICAL DATA:  Evaluate for pneumothorax. Shortness of breath and pericardial effusion. EXAM: PORTABLE CHEST 1 VIEW COMPARISON:  11/29/2023 FINDINGS: Heart size is normal. Similar appearance of widened superior mediastinum compatible with known infiltrative mass. Trace pleural effusions. Previous small right pneumothorax is not seen on the current exam. No interstitial edema or airspace disease. IMPRESSION: 1. Previous small right pneumothorax is not seen on the current exam. 2. Trace pleural effusions. 3. Similar appearance of widened superior mediastinum compatible with known infiltrative mass. Electronically Signed   By: Waddell Calk M.D.   On: 11/29/2023 12:11   DG Chest 1 View Result Date: 11/29/2023 CLINICAL DATA:  Pleural effusion status post thoracentesis EXAM: CHEST  1 VIEW COMPARISON:  11/28/2023 FINDINGS: Single frontal view of  the chest demonstrates a stable cardiac silhouette. Continued ectasia and atherosclerosis of the thoracic aorta. Stable widening of the upper mediastinum consistent with infiltrative soft tissue mass seen on preceding CT. Decreased right pleural effusion after interval thoracentesis. Trace right apical pneumothorax volume estimated far less than 5%, with pleural separation measuring approximately 5 mm. Stable trace left pleural effusion. No acute bony abnormalities. IMPRESSION: 1. Near complete resolution of right pleural effusion after interval thoracentesis. 2. Trace right apical pneumothorax, volume estimated far less than 5%. 3. Stable trace left pleural effusion. 4. Stable mediastinal widening compatible with known infiltrative soft tissue mass. Critical Value/emergent results were called by telephone at the time of interpretation on 11/29/2023 at 11:04 am to provider [facility] , who verbally acknowledged these results. Electronically Signed   By: Ozell Daring M.D.   On: 11/29/2023 11:12   DG CHEST PORT 1 VIEW Result Date: 11/28/2023 CLINICAL DATA:  83 year old female with hypoxia and cough. EXAM: PORTABLE CHEST 1 VIEW COMPARISON:  Portable chest yesterday and earlier. FINDINGS: Portable AP semi upright view at 0824 hours. Large lung volumes, centrilobular emphysema on recent CTA. Bilateral pleural effusions. Stable cardiac contour. Abnormal superior mediastinum, tumor or metastatic ex nodal infiltration on recent CTA. Calcified aortic atherosclerosis. No pneumothorax or pulmonary edema. Stable ventilation since yesterday. Negative visible bowel gas.  Stable visualized osseous structures. IMPRESSION: 1. 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 See Chest CTA details 11/26/2023. 2. Stable ventilation with bilateral pleural effusions superimposed on emphysema. Electronically Signed   By: VEAR Hurst M.D.   On: 11/28/2023 12:15   ECHOCARDIOGRAM LIMITED Result Date: 11/28/2023     ECHOCARDIOGRAM LIMITED REPORT   Patient Name:   Tammy Wolfe Date of Exam: 11/28/2023 Medical Rec #:  [id number]    Height:       59.0 in Accession #:    [id number]   Weight:       80.0 lb Date of Birth:  [birth year]     BSA:          1.249 m Patient Age:    83 years     BP:  141/92 mmHg Patient Gender: F            HR:           112 bpm. Exam Location:  Inpatient Procedure: 2D Echo, Limited Echo, Cardiac Doppler and Color Doppler (Both            Spectral and Color Flow Doppler were utilized during procedure). Indications:    Pericardial effusion  History:        Patient has prior history of Echocardiogram examinations, most                 recent 11/27/2023. COPD; Risk Factors:Hypertension and                 Dyslipidemia.  Sonographer:    Therisa Crouch Referring Phys: [id number] SUNIT TOLIA IMPRESSIONS  1. Limited echo to evaluate pericardial effusion. Moderate to large pericardial effusion, measures 1.9cm adjacent to RV. There is RV indentation during early diastole but no clear RV diastolic collapse. IVC is small with <50% respiratory variation. Not consistent with tamponade at this time but would monitor closely for clinical signs of tamponade and continue to monitor serial echoes FINDINGS  Left Ventricle: Pericardium: Limited echo to evaluate pericardial effusion. Moderate to large pericardial effusion, measures 1.9cm adjacent to RV. There is RV indentation durign early diastole but no clear RV diastolic collapse. IVC is small with <50% respiratory variation. Not consistent with tamponade at this time but would monitor closely for clinical signs of tamponade and suggest repeat echo in 48 hours. IVC IVC diam: 1.20 cm Lonni Nanas MD Electronically signed by Lonni Nanas MD Signature Date/Time: 11/28/2023/11:47:53 AM    Final    ECHOCARDIOGRAM COMPLETE Result Date: 11/27/2023    ECHOCARDIOGRAM REPORT   Patient Name:   Tammy Wolfe Date of Exam: 11/27/2023 Medical Rec #:  [id number]    Height:        59.0 in Accession #:    [id number]   Weight:       80.0 lb Date of Birth:  11-27-1940     BSA:          1.249 m Patient Age:    83 years     BP:           146/90 mmHg Patient Gender: F            HR:           123 bpm. Exam Location:  Inpatient Procedure: 2D Echo (Both Spectral and Color Flow Doppler were utilized during            procedure). STAT ECHO Indications:    pericardial effusion  History:        Patient has no prior history of Echocardiogram examinations.                 COPD and chronic kidney disease, Signs/Symptoms:Shortness of                 Breath; Risk Factors:Hypertension and Dyslipidemia.  Sonographer:    Tinnie Barefoot RDCS Referring Phys: [id number] GLENDIA DASEN WEAVER IMPRESSIONS  1. Left ventricular ejection fraction, by estimation, is >75%. The left ventricle has hyperdynamic function. The left ventricle has no regional wall motion abnormalities. Left ventricular diastolic function could not be evaluated.  2. Right ventricular systolic function is normal. The right ventricular size is normal.  3. Moderate size pericardial effusion, circumferentially, predominantly located anterior to right heart chambers and apex. As per the inflow variations, IVC size, no  obvious evidence of tamponade physiology. Recommend limited echocardiogram in 24 hours to reevaluate size of pericardial effusion and its hemodynamic significance.  4. The mitral valve is degenerative. Trivial mitral valve regurgitation. No evidence of mitral stenosis.  5. The aortic valve is tricuspid. Aortic valve regurgitation is not visualized. Aortic valve sclerosis is present, with no evidence of aortic valve stenosis.  6. The inferior vena cava is normal in size with <50% respiratory variability, suggesting right atrial pressure of 8 mmHg. Comparison(s): No prior Echocardiogram. Conclusion(s)/Recommendation(s): Repeat Limited echo in 24hr to evaluate the pericardial effusion and its hemodynamic significance. FINDINGS  Left Ventricle: Left  ventricular ejection fraction, by estimation, is >75%. The left ventricle has hyperdynamic function. The left ventricle has no regional wall motion abnormalities. The left ventricular internal cavity size was small. There is no left  ventricular hypertrophy. Left ventricular diastolic function could not be evaluated due to nondiagnostic images. Left ventricular diastolic function could not be evaluated. Right Ventricle: The right ventricular size is normal. No increase in right ventricular wall thickness. Right ventricular systolic function is normal. Left Atrium: Left atrial size was normal in size. Right Atrium: Right atrial size was normal in size. Pericardium: Moderate size pericardial effusion, circumferentially, predominantly located anterior to right heart chambers and apex. As per the inflow variations, IVC size, no obvious evidence of tamponade physiology. Recommend limited echocardiogram in 24 hours to reevaluate size of pericardial effusion and its hemodynamic significance. Mitral Valve: The mitral valve is degenerative in appearance. Mild mitral annular calcification. Trivial mitral valve regurgitation. No evidence of mitral valve stenosis. Tricuspid Valve: The tricuspid valve is grossly normal. Tricuspid valve regurgitation is not demonstrated. No evidence of tricuspid stenosis. Aortic Valve: The aortic valve is tricuspid. Aortic valve regurgitation is not visualized. Aortic valve sclerosis is present, with no evidence of aortic valve stenosis. Pulmonic Valve: The pulmonic valve was grossly normal. Pulmonic valve regurgitation is not visualized. Aorta: The ascending aorta was not well visualized. Venous: The inferior vena cava is normal in size with less than 50% respiratory variability, suggesting right atrial pressure of 8 mmHg. IAS/Shunts: The interatrial septum was not well visualized.  LEFT VENTRICLE PLAX 2D LVIDd:         3.50 cm LVIDs:         2.00 cm LV PW:         1.00 cm LV IVS:        0.80 cm  LVOT diam:     1.90 cm LV SV:         27 LV SV Index:   21 LVOT Area:     2.84 cm  RIGHT VENTRICLE             IVC RV Basal diam:  2.30 cm     IVC diam: 1.50 cm RV S prime:     22.80 cm/s TAPSE (M-mode): 2.4 cm LEFT ATRIUM           Index        RIGHT ATRIUM          Index LA diam:      2.40 cm 1.92 cm/m   RA Area:     9.22 cm LA Vol (A4C): 15.8 ml 12.65 ml/m  RA Volume:   19.00 ml 15.21 ml/m  AORTIC VALVE LVOT Vmax:   73.70 cm/s LVOT Vmean:  48.000 cm/s LVOT VTI:    0.095 m  AORTA Ao Asc diam: 2.80 cm MV E velocity: 60.90 cm/s MV A velocity: 122.00  cm/s  SHUNTS MV E/A ratio:  0.50         Systemic VTI:  0.09 m                             Systemic Diam: 1.90 cm Sunit Tolia Electronically signed by Madonna Large Signature Date/Time: 11/27/2023/6:31:40 PM    Final    DG Chest Port 1 View Result Date: 11/27/2023 CLINICAL DATA:  Shortness of breath. EXAM: PORTABLE CHEST 1 VIEW COMPARISON:  11/23/2023, 11/26/2023. FINDINGS: The heart size and mediastinal contours are stable. Atherosclerotic calcification of the aorta is noted. Widening of the mediastinum is unchanged. Emphysematous changes are present in the lungs. There is a small pleural effusion on the right a moderate pleural effusion on the left with atelectasis at the lung bases. No pneumothorax is seen. No acute osseous abnormality. Surgical clips are present in the left axilla. IMPRESSION: 1. Moderate pleural effusion on the right and small pleural effusion on the left with atelectasis at the lung bases. 2. Emphysema. Electronically Signed   By: Leita Birmingham M.D.   On: 11/27/2023 15:15   CT Angio Chest W/Cm &/Or Wo Cm Result Date: 11/26/2023 CLINICAL DATA:  Concern for pulmonary embolism. EXAM: CT ANGIOGRAPHY CHEST WITH CONTRAST TECHNIQUE: Multidetector CT imaging of the chest was performed using the standard protocol during bolus administration of intravenous contrast. Multiplanar CT image reconstructions and MIPs were obtained to evaluate the vascular  anatomy. RADIATION DOSE REDUCTION: This exam was performed according to the departmental dose-optimization program which includes automated exposure control, adjustment of the mA and/or kV according to patient size and/or use of iterative reconstruction technique. CONTRAST:  60mL OMNIPAQUE  IOHEXOL  300 MG/ML  SOLN COMPARISON:  Chest radiograph dated 11/23/2023. FINDINGS: Evaluation is limited due to cachexia and severe streak artifact caused by contrast as well as respiratory motion. Cardiovascular: There is no cardiomegaly. Moderate pericardial effusion measuring 18 mm in thickness and new since the prior CT. Correlation with echocardiogram recommended to exclude cardiac tamponade. There is moderate atherosclerotic calcification of the thoracic aorta. The aorta is tortuous. No aneurysmal dilatation or dissection. The origins of the great vessels of the aortic arch appear patent. No pulmonary artery embolus identified. Mediastinum/Nodes: Ill-defined infiltrative soft tissue throughout the mediastinum. There is an ill-defined masslike area in the mediastinum with splaying of the central pulmonary arteries measuring 3 x 4 cm (63/5). There is lobulated thickening of the soft tissues in the pre-vascular space as well as right suprahilar mediastinum. The esophagus is poorly visualized. Lungs/Pleura: Moderate bilateral pleural effusions with partial compressive atelectasis of the lower lobes. There is background of emphysema. Streaky and nodular density extending from the right hilum to the right apex. A nodular component measures 1.7 x 0.5 cm and new since the prior CT. There is no pneumothorax. Secretions noted in the right mainstem bronchus. The central airways are patent. Upper Abdomen: No acute abnormality. Musculoskeletal: Osteopenia with degenerative changes of the spine. Cachexia. No acute osseous pathology. Review of the MIP images confirms the above findings. IMPRESSION: 1. No CT evidence of pulmonary artery  embolus. 2. Moderate bilateral pleural effusions with partial compressive atelectasis of the lower lobes. 3. Moderate pericardial effusion, new since the prior CT. Correlation with echocardiogram recommended to exclude cardiac tamponade. 4. Ill-defined infiltrative soft tissue throughout the mediastinum with an ill-defined masslike area in the mediastinum with splaying of the central pulmonary arteries. Findings are concerning for neoplastic process. 5. Streaky and nodular  density extending from the right hilum to the right apex, new since the prior CT. Multidisciplinary consult is advised. 6. Aortic Atherosclerosis (ICD10-I70.0) and Emphysema (ICD10-J43.9). Electronically Signed   By: Vanetta Chou M.D.   On: 11/26/2023 15:35   DG Chest 2 View Result Date: 11/24/2023 CLINICAL DATA:  Shortness of breath and hoarseness since November 10, 2023. EXAM: CHEST - 2 VIEW COMPARISON:  Chest x-ray December 06, 2010 FINDINGS: Slightly widened mediastinal contours and tortuous thoracic aortic contours. Calcified atherosclerosis. Hyperinflation of bilateral lungs. Bibasilar heterogeneous and linear pulmonary opacities. Additionally, right apical ill-defined opacity. Possible 6 mm right perihilar pulmonary nodule. Small bilateral pleural effusions. No acute osseous abnormality. The visualized upper abdomen is unremarkable. IMPRESSION: 1. Hyperinflated lungs with bibasilar and right apical pulmonary opacities. Additionally, possible right perihilar pulmonary nodule. Recommend chest CT with contrast for further assessment. 2. Slightly widened mediastinal contours and tortuous thoracic aortic contours. Recommend attention on chest CT for further assessment. Electronically Signed   By: Dirk Arrant M.D.   On: 11/24/2023 14:51     Subjective: -Complains of shortness of breath, also being anxious  Discharge Exam: BP 104/60 (BP Location: Left Arm)   Pulse 95   Temp 97.8 F (36.6 C) (Oral)   Resp 16   Ht 4' 11 (1.499 m)    Wt 44.2 kg   SpO2 94%   BMI 19.68 kg/m   General: Pt is alert, awake, not in acute distress Cardiovascular: RRR, S1/S2 +, no rubs, no gallops Respiratory: CTA bilaterally, no wheezing, no rhonchi Abdominal: Soft, NT, ND, bowel sounds + Extremities: no edema, no cyanosis  The results of significant diagnostics from this hospitalization (including imaging, microbiology, ancillary and laboratory) are listed below for reference.    Microbiology: Recent Results (from the past 240 hours)  Culture, body fluid w Gram Stain-bottle     Status: None   Collection Time: 11/30/23  2:09 PM   Specimen: Path fluid  Result Value Ref Range Status   Specimen Description FLUID  Final   Special Requests NONE  Final   Culture   Final    NO GROWTH 5 DAYS Performed at [facility], [address] [place], [place] [address]    Report Status 12/05/2023 FINAL  Final  Gram stain     Status: None   Collection Time: 11/30/23  2:09 PM   Specimen: Path fluid  Result Value Ref Range Status   Specimen Description FLUID  Final   Special Requests NONE  Final   Gram Stain   Final    RARE WBC PRESENT, PREDOMINANTLY MONONUCLEAR NO ORGANISMS SEEN Performed at [facility], [address], [place], [place] [address]    Report Status 11/30/2023 FINAL  Final  MRSA Next Gen by PCR, Nasal     Status: None   Collection Time: 11/30/23  4:17 PM   Specimen: Nasal Mucosa; Nasal Swab  Result Value Ref Range Status   MRSA by PCR Next Gen NOT DETECTED NOT DETECTED Final    Comment: (NOTE) The GeneXpert MRSA Assay (FDA approved for NASAL specimens only), is one component of a comprehensive MRSA colonization surveillance program. It is not intended to diagnose MRSA infection nor to guide or monitor treatment for MRSA infections. Test performance is not FDA approved in patients less than 16 years old. Performed at [facility], [address], [place], [place] [address]      Labs: Basic Metabolic  Panel: Recent Labs  Lab 12/04/23 0244  12/05/23 0241 12/05/23 1449 12/06/23 0335 12/06/23 2035 12/07/23 0314 12/08/23 0253  NA 128* 126* 128* 128* 129* 132* 132*  K 4.9 4.8 3.7 4.6 5.1 4.7 4.8  CL 93* 91* 101 95* 100 102 99  CO2 22 32 17* 24 20* 23 23  GLUCOSE 107* 116* 143* 134* 128* 105* 141*  BUN 85* 95* 78* 93* 85* 74* 72*  CREATININE 1.58* 1.99* 1.46* 1.71* 1.42* 1.34* 1.51*  CALCIUM 9.7 10.2 7.7* 10.1 10.3 10.3 12.1*  MG 2.9* 3.2*  --  3.4*  --  3.3* 3.6*   Liver Function Tests: No results for input(s): AST, ALT, ALKPHOS, BILITOT, PROT, ALBUMIN  in the last 168 hours. CBC: Recent Labs  Lab 12/04/23 0244 12/05/23 0810 12/06/23 0335 12/07/23 0314 12/08/23 0253  WBC 9.6 10.5 12.1* 9.3 9.8  HGB 11.7* 11.0* 10.5* 10.2* 11.6*  HCT 38.1 35.0* 33.2* 33.0* 37.7  MCV 85.0 83.9 82.8 82.9 83.8  PLT 356 362 347 339 413*   CBG: No results for input(s): GLUCAP in the last 168 hours. Hgb A1c No results for input(s): HGBA1C in the last 72 hours. Lipid Profile No results for input(s): CHOL, HDL, LDLCALC, TRIG, CHOLHDL, LDLDIRECT in the last 72 hours. Thyroid  function studies No results for input(s): TSH, T4TOTAL, T3FREE, THYROIDAB in the last 72 hours.  Invalid input(s): FREET3 Urinalysis    Component Value Date/Time   COLORURINE YELLOW 11/10/2023 1356   APPEARANCEUR CLEAR 11/10/2023 1356   LABSPEC 1.010 11/10/2023 1356   LABSPEC 1.005 12/11/2015 1044   PHURINE 6.0 11/10/2023 1356   GLUCOSEU NEGATIVE 11/10/2023 1356   GLUCOSEU Negative 12/11/2015 1044   HGBUR MODERATE (A) 11/10/2023 1356   BILIRUBINUR NEGATIVE 11/10/2023 1356   BILIRUBINUR Negative 12/11/2015 1044   KETONESUR NEGATIVE 11/10/2023 1356   PROTEINUR Negative 12/11/2015 1044   UROBILINOGEN 0.2 11/10/2023 1356   UROBILINOGEN 0.2 12/11/2015 1044   NITRITE NEGATIVE 11/10/2023 1356   LEUKOCYTESUR NEGATIVE 11/10/2023 1356   LEUKOCYTESUR Large 12/11/2015 1044    FURTHER  DISCHARGE INSTRUCTIONS:   Get Medicines reviewed and adjusted: Please take all your medications with you for your next visit with your Primary MD   Laboratory/radiological data: Please request your Primary MD to go over all hospital tests and procedure/radiological results at the follow up, please ask your Primary MD to get all Hospital records sent to his/her office.   In some cases, they will be blood work, cultures and biopsy results pending at the time of your discharge. Please request that your primary care M.D. goes through all the records of your hospital data and follows up on these results.   Also Note the following: If you experience worsening of your admission symptoms, develop shortness of breath, life threatening emergency, suicidal or homicidal thoughts you must seek medical attention immediately by calling 911 or calling your MD immediately  if symptoms less severe.   You must read complete instructions/literature along with all the possible adverse reactions/side effects for all the Medicines you take and that have been prescribed to you. Take any new Medicines after you have completely understood and accpet all the possible adverse reactions/side effects.    Do not drive when taking Pain medications or sleeping medications (Benzodaizepines)   Do not take more than prescribed Pain, Sleep and Anxiety Medications. It is not advisable to combine anxiety,sleep and pain medications without talking with your primary care practitioner   Special Instructions: If you have smoked or chewed Tobacco  in the last 2 yrs please stop smoking,  stop any regular Alcohol   and or any Recreational drug use.   Wear Seat belts while driving.   Please note: You were cared for by a hospitalist during your hospital stay. Once you are discharged, your primary care physician will handle any further medical issues. Please note that NO REFILLS for any discharge medications will be authorized once you are  discharged, as it is imperative that you return to your primary care physician (or establish a relationship with a primary care physician if you do not have one) for your post hospital discharge needs so that they can reassess your need for medications and monitor your lab values.  Time coordinating discharge: 35 minutes  SIGNED:  Nilda Fendt, MD, PhD 12/10/2023, 11:17 AM

## 2023-12-10 NOTE — Plan of Care (Signed)
   Problem: Clinical Measurements: Goal: Respiratory complications will improve Outcome: Progressing

## 2023-12-10 NOTE — Plan of Care (Signed)
   Problem: Elimination: Goal: Will not experience complications related to bowel motility Outcome: Progressing

## 2023-12-11 DIAGNOSIS — I3139 Other pericardial effusion (noninflammatory): Secondary | ICD-10-CM | POA: Diagnosis not present

## 2023-12-11 DIAGNOSIS — Z515 Encounter for palliative care: Secondary | ICD-10-CM | POA: Diagnosis not present

## 2023-12-11 MED ORDER — SENNOSIDES-DOCUSATE SODIUM 8.6-50 MG PO TABS
2.0000 | ORAL_TABLET | Freq: Two times a day (BID) | ORAL | Status: DC
  Administered 2023-12-11 – 2023-12-13 (×5): 2 via ORAL
  Filled 2023-12-11 (×7): qty 2

## 2023-12-11 NOTE — Progress Notes (Signed)
 PROGRESS NOTE  Tammy Wolfe FMW:[id number] DOB: 03/23/1941 DOA: 11/27/2023 PCP: [name] ORN, MD   LOS: 14 days   Brief Narrative / Interim history: PMH of breast cancer, COPD, HTN, HLD, history of alcohol  abuse and pancreatitis presented to the hospital secondary to abnormal CT findings.  Has shortness of breath for 3 weeks.  Underwent CT scan of the chest outpatient and was found to have pericardial effusion as well as possible lung mass with pleural effusion. Admitted to the ICU.  Cardiology was consulted. 7/4 underwent thoracentesis on right side. 7/7 underwent pericardial drain placement.  Developed A-fib with RVR. 7/9 pericardial drain removed.  Oncology was consulted as the cytology came back positive for pericardial fluid for malignancy. 7/11 transfer to hospitalist service. 7/14.  Repeat thoracentesis performed. 7/15.  After conversation with the patient, currently comfort care.  Subjective / 24h Interval events: She is complaining of intermittent cramping, feels like she has to have a bowel movement but cannot right away.  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  Underwent thoracentesis as well as pericardiocentesis, and cytology of the pericardial fluid came back positive for malignancy, although tissue was  not enough to send provide medication or further testing.  She has stage IV malignancy, has very poor prognosis given poor nutritional status, not a candidate for standard chemotherapy.  Palliative care consulted, she has now been transitioned to comfort. -she would qualify for residential hospice however since she is feeling better, family would like to see how she does before and if they transition to residential hospice, so currently the disposition is somewhat unclear.  She still has a lot of active symptoms that need to be managed and is very unlikely that she will do well at home  Active problems  New onset atrial fibrillation with RVR - On amiodarone .  Not a candidate for anticoagulation.    Acute respiratory failure with hypoxia. History of COPD/severe emphysema. Recurrent right effusions -Treated with Lasix . Currently needing high flow, 7 L.  Was on IV antibiotics, now comfort care  Severe protein calorie malnutrition. Body mass index is 18.12 kg/m - Placing the patient at a high risk for poor outcome. Likely in the setting of malignancy.   AKI, hyponatremia - Received diuresis.  Serum creatinine improved to 1.3 and worsened again to 1.6. Avoid nephrotoxic medication. Sodium level has been on the lower side. Now comfort care.   Goals of care conversation - Palliative care following. Prognosis is poor in the setting of stage IV malignancy with with poor performance status. Not a candidate for chemotherapy.   Possible ileus, gastric distention -symptomatic management  Scheduled Meds:  ALPRAZolam   0.25 mg Oral BID   amiodarone   200 mg Oral BID   arformoterol   15 mcg Nebulization BID   budesonide  (PULMICORT ) nebulizer solution  0.5 mg Nebulization BID   Chlorhexidine  Gluconate  Cloth  6 each Topical Daily   feeding supplement  237 mL Oral TID BM   guaiFENesin   600 mg Oral BID   nystatin   5 mL Oral QID   polyethylene glycol  17 g Oral Daily   revefenacin   175 mcg Nebulization Daily    senna-docusate  2 tablet Oral BID   simethicone   80 mg Oral QID   sodium chloride  flush  3 mL Intravenous Q12H   Continuous Infusions: PRN Meds:.acetaminophen  **OR** acetaminophen , antiseptic oral rinse, artificial tears, bisacodyl , glycopyrrolate  **OR** glycopyrrolate  **OR** glycopyrrolate , haloperidol  **OR** haloperidol  **OR** haloperidol  lactate, HYDROmorphone  (DILAUDID ) injection, ipratropium-albuterol , lip balm, LORazepam  **OR** midazolam , methocarbamol  (ROBAXIN ) injection, midazolam , ondansetron  **OR** ondansetron  (ZOFRAN ) IV, oxyCODONE   Current Outpatient Medications  Medication Instructions   acetaminophen  (TYLENOL ) 650 mg, Every 6 hours PRN   albuterol  (VENTOLIN  HFA) 108 (90 Base) MCG/ACT inhaler 2 puffs, Inhalation, Every 6 hours PRN   ALPRAZolam  (XANAX ) 0.25 MG tablet TAKE ONE TABLET THREE TIMES DAILY AS NEEDED   amiodarone  (PACERONE ) 200 mg, Oral, 2 times daily   cyanocobalamin  1,000 mcg, Daily   cyclobenzaprine  (FLEXERIL ) 5 MG tablet 1 tab by mouth at bedtime as needed for cramps and back pain   Fluticasone-Umeclidin-Vilant (TRELEGY ELLIPTA ) 100-62.5-25 MCG/ACT AEPB 1 puff, Inhalation, Daily   haloperidol  (HALDOL ) 2 mg, Sublingual, Every 4 hours PRN   HYDROcodone -acetaminophen  (NORCO/VICODIN) 5-325 MG tablet 1 tablet, Oral, 2 times daily PRN   LORazepam  (ATIVAN ) 1 mg, Oral, Every 4 hours PRN   losartan  (COZAAR ) 100 mg, Oral, Daily   nystatin  (MYCOSTATIN ) 500,000 Units, Oral, 4 times daily   oxyCODONE  (OXY IR/ROXICODONE ) 5 mg, Oral, Every 4 hours PRN   predniSONE  (DELTASONE ) 10 MG tablet 3 tabs by mouth per day for 3 days,2tabs per day for 3 days,1tab per day for 3 days   trimethoprim  (TRIMPEX ) 100 mg, Oral, Daily   Vitamin D  1,000 Units, Daily    Diet Orders (From admission, onward)     Start     Ordered   12/01/23 1302  Diet regular Room service appropriate? Yes with Assist; Fluid consistency: Thin  Diet effective now       Question Answer Comment  Room service  appropriate? Yes with Assist   Fluid consistency: Thin      12/01/23 1301            DVT prophylaxis:    Lab Results  Component Value Date   PLT 413 (H) 12/08/2023      Code Status: Do not attempt resuscitation (DNR) - Comfort care  Family Communication: Daughter present at bedside  Status is: Inpatient Remains inpatient appropriate because: severity of illness  Level of care: Med-Surg  Consultants:  Palliative Cardiology   Objective: Vitals:   12/10/23 0943 12/10/23 1950 12/10/23 2012 12/11/23 0840  BP: 104/60  123/61 (!) 102/56  Pulse: 95  91 81  Resp: 16 14 15 16   Temp:   97.6 F (36.4 C) 98.4 F (36.9 C)  TempSrc:   Oral Axillary  SpO2:  97% 97% 100%  Weight:      Height:        Intake/Output Summary (Last 24 hours) at 12/11/2023 1002 Last data filed at 12/11/2023 0900 Gross per 24 hour  Intake 120 ml  Output --  Net 120 ml   Wt Readings from Last 3 Encounters:  12/09/23 44.2 kg  11/23/23 36.3 kg  11/10/23 37.6 kg    Examination:  Constitutional: Remains tachypneic Eyes: lids and conjunctivae normal, no scleral icterus ENMT:  mmm Neck: normal, supple Respiratory: Shallow respirations, increased respiratory effort Cardiovascular: Regular rate and rhythm, no murmurs / rubs / gallops. No LE edema. Abdomen: soft, no distention, no tenderness. Bowel sounds positive.   Data Reviewed: I have independently reviewed following labs and imaging studies   CBC Recent Labs  Lab 12/05/23 0810 12/06/23 0335 12/07/23 0314 12/08/23 0253  WBC 10.5 12.1* 9.3 9.8  HGB 11.0* 10.5* 10.2* 11.6*  HCT 35.0* 33.2* 33.0* 37.7  PLT 362 347 339 413*  MCV 83.9 82.8 82.9 83.8  MCH 26.4 26.2 25.6* 25.8*  MCHC 31.4 31.6 30.9 30.8  RDW 15.4 15.2 15.2 15.4    Recent Labs  Lab 12/05/23 0241 12/05/23 1449 12/06/23 0335 12/06/23 2035 12/07/23 0314 12/08/23 0253  NA 126* 128* 128* 129* 132* 132*  K 4.8 3.7 4.6 5.1 4.7 4.8  CL 91* 101 95* 100 102 99  CO2 32  17* 24 20* 23 23  GLUCOSE 116* 143* 134* 128* 105* 141*  BUN 95* 78* 93* 85* 74* 72*  CREATININE 1.99* 1.46* 1.71* 1.42* 1.34* 1.51*  CALCIUM 10.2 7.7* 10.1 10.3 10.3 12.1*  MG 3.2*  --  3.4*  --  3.3* 3.6*    ------------------------------------------------------------------------------------------------------------------ No results for input(s): CHOL, HDL, LDLCALC, TRIG, CHOLHDL, LDLDIRECT in the last 72 hours.  Lab Results  Component Value Date   HGBA1C 6.5 11/23/2023   ------------------------------------------------------------------------------------------------------------------ No results for input(s): TSH, T4TOTAL, T3FREE, THYROIDAB in the last 72 hours.  Invalid input(s): FREET3  Cardiac Enzymes No results for input(s): CKMB, TROPONINI, MYOGLOBIN in the last 168 hours.  Invalid input(s): CK ------------------------------------------------------------------------------------------------------------------    Component Value Date/Time   BNP 60.1 11/27/2023 1430    CBG: No results for input(s): GLUCAP in the last 168 hours.  No results found for this or any previous visit (from the past 240 hours).    Radiology Studies: No results found.   Nilda Fendt, MD, PhD [facility]  Between 7 am - 7 pm I am available, please contact me via Amion (for emergencies) or Securechat (non urgent messages)  Between 7 pm - 7 am I am not available, please contact night coverage MD/APP via Amion

## 2023-12-11 NOTE — Plan of Care (Signed)
   Problem: Pain Managment: Goal: General experience of comfort will improve and/or be controlled Outcome: Progressing   Problem: Skin Integrity: Goal: Risk for impaired skin integrity will decrease Outcome: Progressing

## 2023-12-11 NOTE — Progress Notes (Signed)
 Daily Progress Note   Patient Name: Tammy Wolfe       Date: 12/11/2023 DOB: 1941-05-09  Age: 83 y.o. MRN#: [id number] Attending Physician: Trixie Nilda HERO, MD Primary Care Physician: [name] ORN, MD Admit Date: 11/27/2023  Reason for Consultation/Follow-up: Establishing goals of care  Length of Stay: 14  Current Medications: Scheduled Meds:   ALPRAZolam   0.25 mg Oral BID   amiodarone   200 mg Oral BID   arformoterol   15 mcg Nebulization BID   budesonide  (PULMICORT ) nebulizer solution  0.5 mg Nebulization BID   Chlorhexidine  Gluconate Cloth  6 each Topical Daily   feeding supplement  237 mL Oral TID BM   guaiFENesin   600 mg Oral BID   nystatin   5 mL Oral QID   polyethylene glycol  17 g Oral Daily   revefenacin   175 mcg Nebulization Daily   senna-docusate  2 tablet Oral BID   simethicone   80 mg Oral QID   sodium chloride  flush  3 mL Intravenous Q12H    Continuous Infusions:    PRN Meds: acetaminophen  **OR** acetaminophen , antiseptic oral rinse, artificial tears, bisacodyl , glycopyrrolate  **OR** glycopyrrolate  **OR** glycopyrrolate , haloperidol  **OR** haloperidol  **OR** haloperidol  lactate, HYDROmorphone  (DILAUDID ) injection, ipratropium-albuterol , lip balm, LORazepam  **OR** midazolam , methocarbamol  (ROBAXIN ) injection, midazolam , ondansetron  **OR** ondansetron  (ZOFRAN ) IV, oxyCODONE   Physical Exam Vitals reviewed.  Constitutional:      General: She is not in acute distress.    Appearance: She is cachectic. She is ill-appearing.  HENT:     Head: Normocephalic and atraumatic.  Cardiovascular:     Rate and Rhythm: Normal rate.  Pulmonary:     Effort: Tachypnea present.  Skin:    General: Skin is dry.  Neurological:     Mental Status: She is alert and oriented to person,  place, and time.  Psychiatric:        Mood and Affect: Mood normal.        Behavior: Behavior normal.             Vital Signs: BP (!) 102/56 (BP Location: Left Arm)   Pulse 81   Temp 98.4 F (36.9 C) (Axillary)   Resp 16   Ht 4' 11 (1.499 m)   Wt 44.2 kg   SpO2 100%   BMI 19.68 kg/m  SpO2: SpO2: 100 % O2 Device: O2  Device: Nasal Cannula O2 Flow Rate: O2 Flow Rate (L/min): 5 L/min    Patient Active Problem List   Diagnosis Date Noted   Hospice care patient 12/10/2023   Cachexia (HCC) 12/03/2023   Pressure injury of skin 12/03/2023   Protein-calorie malnutrition, severe 12/01/2023   Acute respiratory failure with hypoxia (HCC) 11/30/2023   Mediastinal mass 11/30/2023   Pleural effusion 11/30/2023   Pericardial effusion 11/27/2023   Pleural effusion, bilateral 11/27/2023   Recent unexplained weight loss 11/27/2023   Hoarse voice quality 11/27/2023   Cigarette smoker 11/27/2023   Chronic obstructive pulmonary disease with acute exacerbation (HCC) 11/27/2023   HX: breast cancer 11/27/2023   COPD exacerbation (HCC) 11/23/2023   Urinary frequency 11/11/2023   Tendonitis of right hand 11/13/2022   Skin lesion of chest wall 11/13/2022   B12 deficiency 11/10/2022   Peripheral neuropathy 05/07/2022   Anxiety 05/07/2022   CKD (chronic kidney disease) stage 3, GFR 30-59 ml/min (HCC) 11/04/2021   Nocturnal foot cramps 11/04/2021   Sinusitis 05/01/2021   Tachycardia 05/01/2021   Chronic left maxillary sinusitis 04/04/2021   Encounter for chronic pain management 04/25/2020   Chronic low back pain 03/12/2016   Recurrent UTI 10/10/2015   Dysuria 03/14/2015   COPD (chronic obstructive pulmonary disease) with chronic bronchitis (HCC) 03/07/2014   Coronary artery calcification seen on CT scan 10/31/2013   Hypokalemia 10/19/2013   Former smoker 09/06/2013   Breast cancer of upper-outer quadrant of left female breast (HCC) 03/22/2013   Chronic back pain 04/26/2012   Impaired  glucose tolerance 04/26/2012   Anemia, unspecified 04/26/2012   Encounter for well adult exam with abnormal findings 11/13/2010   Vitamin D  deficiency 11/13/2010   ABNORMAL THYROID  FUNCTION TESTS 12/10/2009   Breast implant removal status 12/23/2008   Generalized anxiety disorder 03/19/2007   FATIGUE 03/19/2007   GOITER, MULTINODULAR 02/14/2007   Hyperlipidemia 02/14/2007   Essential hypertension 02/14/2007   Allergic rhinitis 02/14/2007   Osteoarthritis 02/14/2007   Osteoporosis 02/14/2007   HX, PERSONAL, ALCOHOLISM 02/14/2007   History of digestive system disease 02/14/2007   History of colonic polyps 02/14/2007   ALCOHOLIC HEPATITIS, HX OF 02/14/2007    Palliative Care Assessment & Plan   Patient Profile: 83 y.o. female  with past medical history of breast cancer, emphysema, HTN, HLD, H/o ETOH abuse, ETOH hepatitis and pancreatitis admitted on 11/27/2023 with at the direction of PCP with abnormal CT findings. CT with bilateral moderate pleural effusions as well as pericardial effusion and concern for mediastinal mass. 7/4 underwent R thoracentesis. 7/7 had pericardial drain placed and was removed 7/9. Also with a fib RVR during admission. Pericardial fluid consistent with malignancy. PMT consulted to discuss GOC.     Today's Discussion: Reviewed chart and received update from attending provider. Patient is on scheduled Xanax . She received several five prn dilaudid  doses over the last 24 hours. [facility] liaison met with family yesterday to discuss inpatient hospice. The patient was having a good day yesterday and they wanted to wait for further decline before pursuing [facility].  Patient sitting up in bed she appears uncomfortable and reports increased gas, cramping, pain, and constipation. She has already taken her scheduled Miralax  and senna. She is on scheduled simethicone . We discussed increasing her bowel regimen since she is having increased opioid use. Discussed using prn dulcolax  suppository with RN- she will discuss with patient. The patient is having moments of shortness of breath/air hunger. Discussed using prn opioids for these symptoms. We discussed adding a  scheduled low dose opioid for pain/ air hunger. She would like to resolve bowel issue before making additional changes to her medications. We will discuss further tomorrow.  We discussed the conversation with [facility] liaison yesterday. I shared that her increased energy and appetite is likely temporary and may be the result of her transition to comfort. Patient and daughter understand this. Emotional support and therapeutic listening provided.  Encouraged her to call PMT if questions or concerns arise. [facility] Cho[facility] booklet left for review.  Recommendations/Plan: DNR/DNI Full comfort measures- consider adding scheduled opioid- will discuss further tomorrow BP when declines further Continued PMT support   Extensive chart review has been completed prior to seeing the patient including vital signs, progress/consult notes, orders, medications, and available advance directive documents.  Care plan was discussed with Dr. Trixie and bedside RN  Time spent: 40 minutes  Thank you for allowing the Palliative Medicine Team to assist in the care of this patient.   Stephane CHRISTELLA Palin, NP  Please contact Palliative Medicine Team phone at [phone] for questions and concerns.

## 2023-12-12 DIAGNOSIS — Z7189 Other specified counseling: Secondary | ICD-10-CM | POA: Diagnosis not present

## 2023-12-12 DIAGNOSIS — Z515 Encounter for palliative care: Secondary | ICD-10-CM | POA: Diagnosis not present

## 2023-12-12 DIAGNOSIS — I3139 Other pericardial effusion (noninflammatory): Secondary | ICD-10-CM | POA: Diagnosis not present

## 2023-12-12 MED ORDER — HYDROMORPHONE HCL 1 MG/ML IJ SOLN
1.0000 mg | INTRAMUSCULAR | Status: DC
  Administered 2023-12-12 – 2023-12-13 (×8): 1 mg via INTRAVENOUS
  Filled 2023-12-12 (×7): qty 1

## 2023-12-12 NOTE — Plan of Care (Signed)

## 2023-12-12 NOTE — Progress Notes (Signed)
 PROGRESS NOTE  Tammy Wolfe FMW:[id number] DOB: 30-Jul-1940 DOA: 11/27/2023 PCP: [name] ORN, MD   LOS: 15 days   Brief Narrative / Interim history: PMH of breast cancer, COPD, HTN, HLD, history of alcohol  abuse and pancreatitis presented to the hospital secondary to abnormal CT findings.  Has shortness of breath for 3 weeks.  Underwent CT scan of the chest outpatient and was found to have pericardial effusion as well as possible lung mass with pleural effusion. Admitted to the ICU.  Cardiology was consulted. 7/4 underwent thoracentesis on right side. 7/7 underwent pericardial drain placement.  Developed A-fib with RVR. 7/9 pericardial drain removed.  Oncology was consulted as the cytology came back positive for pericardial fluid for malignancy. 7/11 transfer to hospitalist service. 7/14.  Repeat thoracentesis performed. 7/15.  After conversation with the patient, currently comfort care.  Subjective / 24h Interval events: She had an episode of significant anxiety last night when she was moved to [address].  Complains of intermittent anxiety this morning as well.  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  Underwent thoracentesis as well as pericardiocentesis, and cytology of the pericardial fluid came back positive for  malignancy, although tissue was not enough to send provide medication or further testing.  She has stage IV malignancy, has very poor prognosis given poor nutritional status, not a candidate for standard chemotherapy.  Palliative care consulted, she has now been transitioned to comfort. - Long discussion with the patient and daughter at bedside this morning.  [facility] was initially planned however she started eating and felt a little bit better and family wanted to see how she does.  She still has pretty significant symptom management with severe anxiety, tachypnea, pain.  I do think that she would benefit from being at [facility], for symptom management, and then potentially go home with hospice once she is on a stable oral regimen.  They are concerned about what will happen if she is home and has uncontrolled symptoms again later on, and I did explain to them that they could return to [facility].  Patient has significant anxiety about any move other than staying in a hospital bed.  My discussion was detailed with palliative care team, I have also involved [facility] liaison as it is somewhat unrealistic for her to stay in the hospital just for symptom management given that there are excellent resources outside the hospital  Active problems  New onset atrial fibrillation with RVR - On amiodarone .  Not a candidate for anticoagulation.    Acute respiratory failure with hypoxia. History of COPD/severe emphysema. Recurrent right effusions -Treated with Lasix . Currently needing high flow, 7 L.  Was on IV antibiotics, now comfort care  Severe protein calorie malnutrition. Body mass index is 18.12 kg/m - Placing the patient at a high risk for poor outcome. Likely in  the setting of malignancy.   AKI, hyponatremia - Received diuresis.  Serum creatinine improved to 1.3 and worsened again to 1.6. Avoid nephrotoxic medication. Sodium level has been on the lower side. Now comfort care.   Goals of care  conversation - Palliative care following. Prognosis is poor in the setting of stage IV malignancy with with poor performance status. Not a candidate for chemotherapy.   Possible ileus, gastric distention -symptomatic management  Scheduled Meds:  ALPRAZolam   0.25 mg Oral BID   amiodarone   200 mg Oral BID   arformoterol   15 mcg Nebulization BID   budesonide  (PULMICORT ) nebulizer solution  0.5 mg Nebulization BID   Chlorhexidine  Gluconate Cloth  6 each Topical Daily   feeding supplement  237 mL Oral TID BM   guaiFENesin   600 mg Oral BID    HYDROmorphone  (DILAUDID ) injection  1 mg Intravenous Q3H   nystatin   5 mL Oral QID   polyethylene glycol  17 g Oral Daily   revefenacin   175 mcg Nebulization Daily   senna-docusate  2 tablet Oral BID   simethicone   80 mg Oral QID   sodium chloride  flush  3 mL Intravenous Q12H   Continuous Infusions: PRN Meds:.acetaminophen  **OR** acetaminophen , antiseptic oral rinse, artificial tears, bisacodyl , glycopyrrolate  **OR** glycopyrrolate  **OR** glycopyrrolate , haloperidol  **OR** haloperidol  **OR** haloperidol  lactate, HYDROmorphone  (DILAUDID ) injection, ipratropium-albuterol , lip balm, LORazepam  **OR** midazolam , methocarbamol  (ROBAXIN ) injection, midazolam , ondansetron  **OR** ondansetron  (ZOFRAN ) IV, oxyCODONE   Current Outpatient Medications  Medication Instructions   acetaminophen  (TYLENOL ) 650 mg, Every 6 hours PRN   albuterol  (VENTOLIN  HFA) 108 (90 Base) MCG/ACT inhaler 2 puffs, Inhalation, Every 6 hours PRN   ALPRAZolam  (XANAX ) 0.25 MG tablet TAKE ONE TABLET THREE TIMES DAILY AS NEEDED   amiodarone  (PACERONE ) 200 mg, Oral, 2 times daily   cyanocobalamin  1,000 mcg, Daily   cyclobenzaprine  (FLEXERIL ) 5 MG tablet 1 tab by mouth at bedtime as needed for cramps and back pain   Fluticasone-Umeclidin-Vilant (TRELEGY ELLIPTA ) 100-62.5-25 MCG/ACT AEPB 1 puff, Inhalation, Daily   haloperidol  (HALDOL ) 2 mg, Sublingual, Every 4 hours PRN    HYDROcodone -acetaminophen  (NORCO/VICODIN) 5-325 MG tablet 1 tablet, Oral, 2 times daily PRN   LORazepam  (ATIVAN ) 1 mg, Oral, Every 4 hours PRN   losartan  (COZAAR ) 100 mg, Oral, Daily   nystatin  (MYCOSTATIN ) 500,000 Units, Oral, 4 times daily   oxyCODONE  (OXY IR/ROXICODONE ) 5 mg, Oral, Every 4 hours PRN   predniSONE  (DELTASONE ) 10 MG tablet 3 tabs by mouth per day for 3 days,2tabs per day for 3 days,1tab per day for 3 days   trimethoprim  (TRIMPEX ) 100 mg, Oral, Daily   Vitamin D  1,000 Units, Daily    Diet Orders (From admission, onward)     Start     Ordered   12/01/23 1302  Diet regular Room service appropriate? Yes with Assist; Fluid consistency: Thin  Diet effective now       Question Answer Comment  Room service appropriate? Yes with Assist   Fluid consistency: Thin      12/01/23 1301            DVT prophylaxis:    Lab Results  Component Value Date   PLT 413 (H) 12/08/2023      Code Status: Do not attempt resuscitation (DNR) - Comfort care  Family Communication: Daughter present at bedside  Status is: Inpatient Remains inpatient appropriate because: severity of illness  Level of care: Med-Surg  Consultants:  Palliative Cardiology   Objective: Vitals:   12/12/23 9563 12/12/23 0750 12/12/23 0751  12/12/23 0752  BP: (!) 108/52     Pulse: 81 80    Resp: 16 16    Temp: 98.4 F (36.9 C)     TempSrc: Oral     SpO2: 100% 100% 100% 100%  Weight:      Height:        Intake/Output Summary (Last 24 hours) at 12/12/2023 9041 Last data filed at 12/12/2023 0900 Gross per 24 hour  Intake 120 ml  Output --  Net 120 ml   Wt Readings from Last 3 Encounters:  12/09/23 44.2 kg  11/23/23 36.3 kg  11/10/23 37.6 kg    Examination:  Constitutional: Anxious, tachypneic Eyes: lids and conjunctivae normal, no scleral icterus ENMT: mmm Neck: normal, supple Respiratory: Creased respiratory effort but lungs overall clear without wheezing Cardiovascular: Regular rate  and rhythm, no murmurs / rubs / gallops. No LE edema. Abdomen: soft, no distention, no tenderness. Bowel sounds positive.   Data Reviewed: I have independently reviewed following labs and imaging studies   CBC Recent Labs  Lab 12/06/23 0335 12/07/23 0314 12/08/23 0253  WBC 12.1* 9.3 9.8  HGB 10.5* 10.2* 11.6*  HCT 33.2* 33.0* 37.7  PLT 347 339 413*  MCV 82.8 82.9 83.8  MCH 26.2 25.6* 25.8*  MCHC 31.6 30.9 30.8  RDW 15.2 15.2 15.4    Recent Labs  Lab 12/05/23 1449 12/06/23 0335 12/06/23 2035 12/07/23 0314 12/08/23 0253  NA 128* 128* 129* 132* 132*  K 3.7 4.6 5.1 4.7 4.8  CL 101 95* 100 102 99  CO2 17* 24 20* 23 23  GLUCOSE 143* 134* 128* 105* 141*  BUN 78* 93* 85* 74* 72*  CREATININE 1.46* 1.71* 1.42* 1.34* 1.51*  CALCIUM 7.7* 10.1 10.3 10.3 12.1*  MG  --  3.4*  --  3.3* 3.6*    ------------------------------------------------------------------------------------------------------------------ No results for input(s): CHOL, HDL, LDLCALC, TRIG, CHOLHDL, LDLDIRECT in the last 72 hours.  Lab Results  Component Value Date   HGBA1C 6.5 11/23/2023   ------------------------------------------------------------------------------------------------------------------ No results for input(s): TSH, T4TOTAL, T3FREE, THYROIDAB in the last 72 hours.  Invalid input(s): FREET3  Cardiac Enzymes No results for input(s): CKMB, TROPONINI, MYOGLOBIN in the last 168 hours.  Invalid input(s): CK ------------------------------------------------------------------------------------------------------------------    Component Value Date/Time   BNP 60.1 11/27/2023 1430    CBG: No results for input(s): GLUCAP in the last 168 hours.  No results found for this or any previous visit (from the past 240 hours).    Radiology Studies: No results found.   Nilda Fendt, MD, PhD [facility]  Between 7 am - 7 pm I am available, please contact me  via Amion (for emergencies) or Securechat (non urgent messages)  Between 7 pm - 7 am I am not available, please contact night coverage MD/APP via Amion

## 2023-12-12 NOTE — Plan of Care (Signed)

## 2023-12-12 NOTE — Progress Notes (Signed)
 Daily Progress Note   Patient Name: Tammy Wolfe       Date: 12/12/2023 DOB: 05/07/1941  Age: 83 y.o. MRN#: [id number] Attending Physician: Trixie Nilda HERO, MD Primary Care Physician: [name] ORN, MD Admit Date: 11/27/2023  Reason for Consultation/Follow-up: Establishing goals of care  Length of Stay: 15  Current Medications: Scheduled Meds:   ALPRAZolam   0.25 mg Oral BID   amiodarone   200 mg Oral BID   arformoterol   15 mcg Nebulization BID   budesonide  (PULMICORT ) nebulizer solution  0.5 mg Nebulization BID   Chlorhexidine  Gluconate Cloth  6 each Topical Daily   feeding supplement  237 mL Oral TID BM   guaiFENesin   600 mg Oral BID    HYDROmorphone  (DILAUDID ) injection  1 mg Intravenous Q3H   nystatin   5 mL Oral QID   polyethylene glycol  17 g Oral Daily   revefenacin   175 mcg Nebulization Daily   senna-docusate  2 tablet Oral BID   simethicone   80 mg Oral QID   sodium chloride  flush  3 mL Intravenous Q12H    Continuous Infusions:    PRN Meds: acetaminophen  **OR** acetaminophen , antiseptic oral rinse, artificial tears, bisacodyl , glycopyrrolate  **OR** glycopyrrolate  **OR** glycopyrrolate , haloperidol  **OR** haloperidol  **OR** haloperidol  lactate, HYDROmorphone  (DILAUDID ) injection, ipratropium-albuterol , lip balm, LORazepam  **OR** midazolam , methocarbamol  (ROBAXIN ) injection, midazolam , ondansetron  **OR** ondansetron  (ZOFRAN ) IV, oxyCODONE   Physical Exam Vitals reviewed.  Constitutional:      General: She is not in acute distress.    Appearance: She is cachectic. She is ill-appearing.  HENT:     Head: Normocephalic and atraumatic.  Pulmonary:     Breath sounds: No stridor.  Skin:    General: Skin is warm and dry.  Neurological:     Mental Status: She is alert and  oriented to person, place, and time.  Psychiatric:        Mood and Affect: Mood is anxious.        Behavior: Behavior normal.             Vital Signs: BP (!) 108/52 (BP Location: Left Arm)   Pulse 80   Temp 98.4 F (36.9 C) (Oral)   Resp 16   Ht 4' 11 (1.499 m)   Wt 44.2 kg   SpO2 100%   BMI 19.68 kg/m  SpO2: SpO2: 100 %  O2 Device: O2 Device: Room Air O2 Flow Rate: O2 Flow Rate (L/min): 4 L/min    Patient Active Problem List   Diagnosis Date Noted   Hospice care patient 12/10/2023   Cachexia (HCC) 12/03/2023   Pressure injury of skin 12/03/2023   Protein-calorie malnutrition, severe 12/01/2023   Acute respiratory failure with hypoxia (HCC) 11/30/2023   Mediastinal mass 11/30/2023   Pleural effusion 11/30/2023   Pericardial effusion 11/27/2023   Pleural effusion, bilateral 11/27/2023   Recent unexplained weight loss 11/27/2023   Hoarse voice quality 11/27/2023   Cigarette smoker 11/27/2023   Chronic obstructive pulmonary disease with acute exacerbation (HCC) 11/27/2023   HX: breast cancer 11/27/2023   COPD exacerbation (HCC) 11/23/2023   Urinary frequency 11/11/2023   Tendonitis of right hand 11/13/2022   Skin lesion of chest wall 11/13/2022   B12 deficiency 11/10/2022   Peripheral neuropathy 05/07/2022   Anxiety 05/07/2022   CKD (chronic kidney disease) stage 3, GFR 30-59 ml/min (HCC) 11/04/2021   Nocturnal foot cramps 11/04/2021   Sinusitis 05/01/2021   Tachycardia 05/01/2021   Chronic left maxillary sinusitis 04/04/2021   Encounter for chronic pain management 04/25/2020   Chronic low back pain 03/12/2016   Recurrent UTI 10/10/2015   Dysuria 03/14/2015   COPD (chronic obstructive pulmonary disease) with chronic bronchitis (HCC) 03/07/2014   Coronary artery calcification seen on CT scan 10/31/2013   Hypokalemia 10/19/2013   Former smoker 09/06/2013   Breast cancer of upper-outer quadrant of left female breast (HCC) 03/22/2013   Chronic back pain 04/26/2012    Impaired glucose tolerance 04/26/2012   Anemia, unspecified 04/26/2012   Encounter for well adult exam with abnormal findings 11/13/2010   Vitamin D  deficiency 11/13/2010   ABNORMAL THYROID  FUNCTION TESTS 12/10/2009   Breast implant removal status 12/23/2008   Generalized anxiety disorder 03/19/2007   FATIGUE 03/19/2007   GOITER, MULTINODULAR 02/14/2007   Hyperlipidemia 02/14/2007   Essential hypertension 02/14/2007   Allergic rhinitis 02/14/2007   Osteoarthritis 02/14/2007   Osteoporosis 02/14/2007   HX, PERSONAL, ALCOHOLISM 02/14/2007   History of digestive system disease 02/14/2007   History of colonic polyps 02/14/2007   ALCOHOLIC HEPATITIS, HX OF 02/14/2007    Palliative Care Assessment & Plan   Patient Profile: 83 y.o. female  with past medical history of breast cancer, emphysema, HTN, HLD, H/o ETOH abuse, ETOH hepatitis and pancreatitis admitted on 11/27/2023 with at the direction of PCP with abnormal CT findings. CT with bilateral moderate pleural effusions as well as pericardial effusion and concern for mediastinal mass. 7/4 underwent R thoracentesis. 7/7 had pericardial drain placed and was removed 7/9. Also with a fib RVR during admission. Pericardial fluid consistent with malignancy. PMT consulted to discuss GOC.     Today's Discussion: Reviewed chart and received update from attending provider. Patient is very anxious over any changes such as location, medications, or plan of care. Attending provider discussed with patient and family the possibility of moving to [facility] after the weekend. Patient is on scheduled Xanax  as she takes this at home. She received several prn dilaudid  doses over the last 24 hours.   Patient sitting up in bed she appears uncomfortable and reports increased anxiety. Her daughter shares that she became more anxious last night after the move to 6N. The daughter shared the patient did not take all of her xanax  doses yesterday as she was worried about taking  Xanax  and pain medication at the same time as this was a concern before transitioning to comfort measures.  Discussed the purpose of comfort focused measures is to allow the patient dignity and comfort at end-of-life.  Encouraged patient and daughter to request comfort medications as they were needed.  Discussed adding scheduled Dilaudid  every 3 hours for pain and air hunger.  The patient reports a bowel movement yesterday evening.  We discussed the importance of continuing her bowel regimen and encouraged her to use her as needed Dulcolax today.  We discussed the patient's increasing anxiety at length.  Encouraged her to utilize as needed lorazepam  for anxiety.  Attempted to discuss plan moving forward including the location of end-of-life care but this was overwhelming for the patient at this time.  Plan to discuss tomorrow.  Encouraged her to call PMT if questions or concerns arise. Hard Choices booklet left for review.  Recommendations/Plan: DNR/DNI Full comfort measures- add scheduled IV dilaudid  1 mg every three hours Encouraged patient to use medications for symptom management Continued PMT support   Extensive chart review has been completed prior to seeing the patient including vital signs, progress/consult notes, orders, medications, and available advance directive documents.  Care plan was discussed with Dr. Trixie and bedside RN  Time spent: 50 minutes  Thank you for allowing the Palliative Medicine Team to assist in the care of this patient.   Stephane CHRISTELLA Palin, NP  Please contact Palliative Medicine Team phone at [phone] for questions and concerns.

## 2023-12-13 DIAGNOSIS — Z515 Encounter for palliative care: Secondary | ICD-10-CM | POA: Diagnosis not present

## 2023-12-13 DIAGNOSIS — I3139 Other pericardial effusion (noninflammatory): Secondary | ICD-10-CM | POA: Diagnosis not present

## 2023-12-13 MED ORDER — MORPHINE SULFATE (PF) 2 MG/ML IV SOLN
0.5000 mg | Freq: Once | INTRAVENOUS | Status: AC
  Administered 2023-12-13: 0.5 mg via INTRAVENOUS
  Filled 2023-12-13: qty 1

## 2023-12-13 MED ORDER — ALPRAZOLAM 0.25 MG PO TABS: 0.2500 mg | ORAL_TABLET | Freq: Three times a day (TID) | ORAL | Status: DC | PRN

## 2023-12-13 MED ORDER — MORPHINE SULFATE (PF) 2 MG/ML IV SOLN
1.0000 mg | INTRAVENOUS | Status: DC | PRN
  Administered 2023-12-14 – 2023-12-15 (×7): 2 mg via INTRAVENOUS
  Filled 2023-12-13 (×7): qty 1

## 2023-12-13 MED ORDER — MORPHINE SULFATE (PF) 4 MG/ML IV SOLN
4.0000 mg | INTRAVENOUS | Status: DC
  Administered 2023-12-13 – 2023-12-15 (×10): 4 mg via INTRAVENOUS
  Filled 2023-12-13 (×10): qty 1

## 2023-12-13 MED ORDER — LORAZEPAM 2 MG/ML PO CONC
0.5000 mg | Freq: Four times a day (QID) | ORAL | Status: DC
  Administered 2023-12-13 – 2023-12-14 (×4): 0.5 mg via ORAL
  Filled 2023-12-13 (×5): qty 1

## 2023-12-13 NOTE — Plan of Care (Signed)
  Problem: Pain Managment: Goal: General experience of comfort will improve and/or be controlled Outcome: Progressing   Problem: Safety: Goal: Ability to remain free from injury will improve Outcome: Progressing

## 2023-12-13 NOTE — Progress Notes (Signed)
 Tammy Wolfe [id number]  [facility] Liaison Note  Referral received for family interest in [facility].   Met with patient and family in room to explain services and hospice philosophy and all questions answered. Plan is to assess for appropriateness on tomorrow per patient/family request. Liaison to follow up on tomorrow.    Thank you for the opportunity to participate in this patient's care   Nat Babe, BSN, RN Hospice Nurse Liaison [phone]

## 2023-12-13 NOTE — Progress Notes (Signed)
 PROGRESS NOTE  Tammy Wolfe FMW:[id number] DOB: 1940/06/18 DOA: 11/27/2023 PCP: [name] ORN, MD   LOS: 16 days   Brief Narrative / Interim history: PMH of breast cancer, COPD, HTN, HLD, history of alcohol  abuse and pancreatitis presented to the hospital secondary to abnormal CT findings.  Has shortness of breath for 3 weeks.  Underwent CT scan of the chest outpatient and was found to have pericardial effusion as well as possible lung mass with pleural effusion. Admitted to the ICU.  Cardiology was consulted. 7/4 underwent thoracentesis on right side. 7/7 underwent pericardial drain placement.  Developed A-fib with RVR. 7/9 pericardial drain removed.  Oncology was consulted as the cytology came back positive for pericardial fluid for malignancy. 7/11 transfer to hospitalist service. 7/14.  Repeat thoracentesis performed. 7/15.  After conversation with the patient, currently comfort care.  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  Underwent thoracentesis as well as pericardiocentesis, and cytology of the pericardial fluid came back positive for malignancy, although tissue was not enough to send provide medication or further testing.  She has stage IV malignancy, has very poor prognosis  given poor nutritional status, not a candidate for standard chemotherapy.  Palliative care consulted, she has now been transitioned to comfort. -continue to adjust meds for symptom control / anxiety / pain. -clinically slightly worse this morning  Active problems  New onset atrial fibrillation with RVR - On amiodarone .  Not a candidate for anticoagulation.    Acute respiratory failure with hypoxia. History of COPD/severe emphysema. Recurrent right effusions -Treated with Lasix . Currently needing high flow, 7 L.  Was on IV antibiotics, now comfort care  Severe protein calorie malnutrition. Body mass index is 18.12 kg/m - Placing the patient at a high risk for poor outcome. Likely in the setting of malignancy.   AKI, hyponatremia - Received diuresis.  Serum creatinine improved to 1.3 and worsened again to 1.6. Avoid nephrotoxic medication. Sodium level has been on the lower side. Now comfort care.   Goals of care conversation - Palliative care following. Prognosis is poor in the setting of stage IV malignancy with with poor performance status. Not a candidate for chemotherapy.   Possible ileus, gastric distention -symptomatic management  Scheduled Meds:  amiodarone   200 mg Oral BID   arformoterol   15 mcg Nebulization BID   budesonide  (PULMICORT ) nebulizer solution  0.5 mg Nebulization BID   Chlorhexidine  Gluconate Cloth  6 each Topical Daily   feeding supplement  237 mL Oral TID BM   guaiFENesin   600 mg Oral BID   LORazepam   0.5 mg Oral Q6H    morphine  injection  4 mg Intravenous Q3H   nystatin   5 mL Oral QID   polyethylene glycol  17 g Oral Daily   revefenacin   175 mcg Nebulization Daily   senna-docusate  2 tablet  Oral BID   simethicone   80 mg Oral QID   sodium chloride  flush  3 mL Intravenous Q12H   Continuous Infusions: PRN Meds:.acetaminophen  **OR** acetaminophen , ALPRAZolam , antiseptic oral rinse, artificial tears, bisacodyl , glycopyrrolate  **OR** glycopyrrolate  **OR**  glycopyrrolate , haloperidol  **OR** haloperidol  **OR** haloperidol  lactate, ipratropium-albuterol , lip balm, methocarbamol  (ROBAXIN ) injection, midazolam , [DISCONTINUED] LORazepam  **OR** midazolam , morphine  injection, ondansetron  **OR** ondansetron  (ZOFRAN ) IV, oxyCODONE   Current Outpatient Medications  Medication Instructions   acetaminophen  (TYLENOL ) 650 mg, Every 6 hours PRN   albuterol  (VENTOLIN  HFA) 108 (90 Base) MCG/ACT inhaler 2 puffs, Inhalation, Every 6 hours PRN   ALPRAZolam  (XANAX ) 0.25 MG tablet TAKE ONE TABLET THREE TIMES DAILY AS NEEDED   amiodarone  (PACERONE ) 200 mg, Oral, 2 times daily   cyanocobalamin  1,000 mcg, Daily   cyclobenzaprine  (FLEXERIL ) 5 MG tablet 1 tab by mouth at bedtime as needed for cramps and back pain   Fluticasone-Umeclidin-Vilant (TRELEGY ELLIPTA ) 100-62.5-25 MCG/ACT AEPB 1 puff, Inhalation, Daily   haloperidol  (HALDOL ) 2 mg, Sublingual, Every 4 hours PRN   HYDROcodone -acetaminophen  (NORCO/VICODIN) 5-325 MG tablet 1 tablet, Oral, 2 times daily PRN   LORazepam  (ATIVAN ) 1 mg, Oral, Every 4 hours PRN   losartan  (COZAAR ) 100 mg, Oral, Daily   nystatin  (MYCOSTATIN ) 500,000 Units, Oral, 4 times daily   oxyCODONE  (OXY IR/ROXICODONE ) 5 mg, Oral, Every 4 hours PRN   predniSONE  (DELTASONE ) 10 MG tablet 3 tabs by mouth per day for 3 days,2tabs per day for 3 days,1tab per day for 3 days   trimethoprim  (TRIMPEX ) 100 mg, Oral, Daily   Vitamin D  1,000 Units, Daily    Diet Orders (From admission, onward)     Start     Ordered   12/01/23 1302  Diet regular Room service appropriate? Yes with Assist; Fluid consistency: Thin  Diet effective now       Question Answer Comment  Room service appropriate? Yes with Assist   Fluid consistency: Thin      12/01/23 1301            DVT prophylaxis:    Lab Results  Component Value Date   PLT 413 (H) 12/08/2023      Code Status: Do not attempt resuscitation (DNR) - Comfort care  Family Communication: Daughter present  at bedside  Status is: Inpatient Remains inpatient appropriate because: severity of illness  Level of care: Med-Surg  Consultants:  Palliative Cardiology   Objective: Vitals:   12/12/23 0751 12/12/23 0752 12/13/23 0556 12/13/23 0909  BP:   121/70   Pulse:   (!) 110   Resp:   17   Temp:   97.9 F (36.6 C)   TempSrc:   Oral   SpO2: 100% 100% 94% 94%  Weight:      Height:        Intake/Output Summary (Last 24 hours) at 12/13/2023 1117 Last data filed at 12/13/2023 0400 Gross per 24 hour  Intake 600 ml  Output 300 ml  Net 300 ml   Wt Readings from Last 3 Encounters:  12/09/23 44.2 kg  11/23/23 36.3 kg  11/10/23 37.6 kg    Examination:  Constitutional: anxious, tachypneic Eyes: lids and conjunctivae normal, no scleral icterus ENMT: mmm Neck: normal, supple Respiratory: increased WOB, no wheezing   Cardiovascular: Regular rate and rhythm, no murmurs / rubs / gallops. No LE edema. Abdomen: soft, no distention, no tenderness. Bowel sounds positive.   Data Reviewed: I have independently reviewed following labs and imaging studies   CBC Recent Labs  Lab 12/07/23 0314  12/08/23 0253  WBC 9.3 9.8  HGB 10.2* 11.6*  HCT 33.0* 37.7  PLT 339 413*  MCV 82.9 83.8  MCH 25.6* 25.8*  MCHC 30.9 30.8  RDW 15.2 15.4    Recent Labs  Lab 12/06/23 2035 12/07/23 0314 12/08/23 0253  NA 129* 132* 132*  K 5.1 4.7 4.8  CL 100 102 99  CO2 20* 23 23  GLUCOSE 128* 105* 141*  BUN 85* 74* 72*  CREATININE 1.42* 1.34* 1.51*  CALCIUM 10.3 10.3 12.1*  MG  --  3.3* 3.6*    ------------------------------------------------------------------------------------------------------------------ No results for input(s): CHOL, HDL, LDLCALC, TRIG, CHOLHDL, LDLDIRECT in the last 72 hours.  Lab Results  Component Value Date   HGBA1C 6.5 11/23/2023   ------------------------------------------------------------------------------------------------------------------ No results  for input(s): TSH, T4TOTAL, T3FREE, THYROIDAB in the last 72 hours.  Invalid input(s): FREET3  Cardiac Enzymes No results for input(s): CKMB, TROPONINI, MYOGLOBIN in the last 168 hours.  Invalid input(s): CK ------------------------------------------------------------------------------------------------------------------    Component Value Date/Time   BNP 60.1 11/27/2023 1430    CBG: No results for input(s): GLUCAP in the last 168 hours.  No results found for this or any previous visit (from the past 240 hours).    Radiology Studies: No results found.   Nilda Fendt, MD, PhD [facility]  Between 7 am - 7 pm I am available, please contact me via Amion (for emergencies) or Securechat (non urgent messages)  Between 7 pm - 7 am I am not available, please contact night coverage MD/APP via Amion

## 2023-12-13 NOTE — Progress Notes (Addendum)
 Daily Progress Note   Patient Name: Tammy Wolfe       Date: 12/13/2023 DOB: [birth year]  Age: 83 y.o. MRN#: [id number] Attending Physician: Trixie Nilda HERO, MD Primary Care Physician: [name] ORN, MD Admit Date: 11/27/2023  Reason for Consultation/Follow-up: Establishing goals of care  Length of Stay: 16  Current Medications: Scheduled Meds:   amiodarone   200 mg Oral BID   arformoterol   15 mcg Nebulization BID   budesonide  (PULMICORT ) nebulizer solution  0.5 mg Nebulization BID   Chlorhexidine  Gluconate Cloth  6 each Topical Daily   feeding supplement  237 mL Oral TID BM   guaiFENesin   600 mg Oral BID   LORazepam   0.5 mg Oral Q6H    morphine  injection  4 mg Intravenous Q3H   nystatin   5 mL Oral QID   polyethylene glycol  17 g Oral Daily   revefenacin   175 mcg Nebulization Daily   senna-docusate  2 tablet Oral BID   simethicone   80 mg Oral QID   sodium chloride  flush  3 mL Intravenous Q12H    Continuous Infusions:    PRN Meds: acetaminophen  **OR** acetaminophen , ALPRAZolam , antiseptic oral rinse, artificial tears, bisacodyl , glycopyrrolate  **OR** glycopyrrolate  **OR** glycopyrrolate , haloperidol  **OR** haloperidol  **OR** haloperidol  lactate, ipratropium-albuterol , lip balm, methocarbamol  (ROBAXIN ) injection, midazolam , [DISCONTINUED] LORazepam  **OR** midazolam , morphine  injection, ondansetron  **OR** ondansetron  (ZOFRAN ) IV, oxyCODONE   Physical Exam Vitals reviewed.  Constitutional:      General: She is not in acute distress.    Appearance: She is cachectic. She is ill-appearing.  HENT:     Head: Normocephalic and atraumatic.  Pulmonary:     Effort: Tachypnea present.  Skin:    General: Skin is warm and dry.  Neurological:     Mental Status: She is alert and oriented to  person, place, and time.  Psychiatric:        Mood and Affect: Mood is anxious.        Behavior: Behavior normal.             Vital Signs: BP 121/70 (BP Location: Left Arm)   Pulse (!) 110   Temp 97.9 F (36.6 C) (Oral)   Resp 17   Ht 4' 11 (1.499 m)   Wt 44.2 kg   SpO2 94%   BMI 19.68 kg/m  SpO2: SpO2: 94 %  O2 Device: O2 Device: Nasal Cannula O2 Flow Rate: O2 Flow Rate (L/min): 3.5 L/min    Patient Active Problem List   Diagnosis Date Noted   Hospice care patient 12/10/2023   Cachexia (HCC) 12/03/2023   Pressure injury of skin 12/03/2023   Protein-calorie malnutrition, severe 12/01/2023   Acute respiratory failure with hypoxia (HCC) 11/30/2023   Mediastinal mass 11/30/2023   Pleural effusion 11/30/2023   Pericardial effusion 11/27/2023   Pleural effusion, bilateral 11/27/2023   Recent unexplained weight loss 11/27/2023   Hoarse voice quality 11/27/2023   Cigarette smoker 11/27/2023   Chronic obstructive pulmonary disease with acute exacerbation (HCC) 11/27/2023   HX: breast cancer 11/27/2023   COPD exacerbation (HCC) 11/23/2023   Urinary frequency 11/11/2023   Tendonitis of right hand 11/13/2022   Skin lesion of chest wall 11/13/2022   B12 deficiency 11/10/2022   Peripheral neuropathy 05/07/2022   Anxiety 05/07/2022   CKD (chronic kidney disease) stage 3, GFR 30-59 ml/min (HCC) 11/04/2021   Nocturnal foot cramps 11/04/2021   Sinusitis 05/01/2021   Tachycardia 05/01/2021   Chronic left maxillary sinusitis 04/04/2021   Encounter for chronic pain management 04/25/2020   Chronic low back pain 03/12/2016   Recurrent UTI 10/10/2015   Dysuria 03/14/2015   COPD (chronic obstructive pulmonary disease) with chronic bronchitis (HCC) 03/07/2014   Coronary artery calcification seen on CT scan 10/31/2013   Hypokalemia 10/19/2013   Former smoker 09/06/2013   Breast cancer of upper-outer quadrant of left female breast (HCC) 03/22/2013   Chronic back pain 04/26/2012    Impaired glucose tolerance 04/26/2012   Anemia, unspecified 04/26/2012   Encounter for well adult exam with abnormal findings 11/13/2010   Vitamin D  deficiency 11/13/2010   ABNORMAL THYROID  FUNCTION TESTS 12/10/2009   Breast implant removal status 12/23/2008   Generalized anxiety disorder 03/19/2007   FATIGUE 03/19/2007   GOITER, MULTINODULAR 02/14/2007   Hyperlipidemia 02/14/2007   Essential hypertension 02/14/2007   Allergic rhinitis 02/14/2007   Osteoarthritis 02/14/2007   Osteoporosis 02/14/2007   HX, PERSONAL, ALCOHOLISM 02/14/2007   History of digestive system disease 02/14/2007   History of colonic polyps 02/14/2007   ALCOHOLIC HEPATITIS, HX OF 02/14/2007    Palliative Care Assessment & Plan   Patient Profile: 83 y.o. female  with past medical history of breast cancer, emphysema, HTN, HLD, H/o ETOH abuse, ETOH hepatitis and pancreatitis admitted on 11/27/2023 with at the direction of PCP with abnormal CT findings. CT with bilateral moderate pleural effusions as well as pericardial effusion and concern for mediastinal mass. 7/4 underwent R thoracentesis. 7/7 had pericardial drain placed and was removed 7/9. Also with a fib RVR during admission. Pericardial fluid consistent with malignancy. PMT consulted to discuss GOC.     Today's Discussion: Reviewed chart and received update from attending provider. Patient is very anxious this morning after waking up feeling short of breath. Nursing at bedside to provide one time morphine  dose ordered by primary. Patient reports that her pain has been managed with scheduled dilaudid  but she has felt a little woozy. We discuss changing her scheduled dilaudid  to morphine  to see if she gets improved relief of work of breathing/air hunger. We will also have prn morphine  available. She had a good bowel movement yesterday so we will keep her bowel regimen the same. Discussed scheduling lorazepam  for anxiety and having xanax  available prn TID. Patient  shared she is hesitant to call out for prn medications. Strongly encouraged her to request medications. We discussed that as her symptoms worsen  we may need to adjust her medications further. When we discussed having to balance symptom relief and being woozy she shared her goal is to not feel short of breath.   Encouraged her to call PMT if questions or concerns arise. Hard Choices booklet left for review.  Recommendations/Plan: DNR/DNI Full comfort measures Discontinued scheduled dilaudid , scheduled xanax , and prn dilaudid  Added scheduled morphine , prn morphine , scheduled lorazepam , prn xanax  tid Encouraged patient to use medications for symptom management Continued PMT support   Extensive chart review has been completed prior to seeing the patient including vital signs, progress/consult notes, orders, medications, and available advance directive documents.  Care plan was discussed with Dr. Trixie and bedside RN  Time spent:65 minutes  Thank you for allowing the Palliative Medicine Team to assist in the care of this patient.   Stephane CHRISTELLA Palin, NP  Please contact Palliative Medicine Team phone at [phone] for questions and concerns.

## 2023-12-14 ENCOUNTER — Ambulatory Visit: Admitting: Cardiology

## 2023-12-14 DIAGNOSIS — Z515 Encounter for palliative care: Secondary | ICD-10-CM | POA: Diagnosis not present

## 2023-12-14 DIAGNOSIS — I3139 Other pericardial effusion (noninflammatory): Secondary | ICD-10-CM | POA: Diagnosis not present

## 2023-12-14 NOTE — Plan of Care (Signed)
  Problem: Pain Managment: Goal: General experience of comfort will improve and/or be controlled Outcome: Progressing   Problem: Safety: Goal: Ability to remain free from injury will improve Outcome: Progressing   Problem: Skin Integrity: Goal: Risk for impaired skin integrity will decrease Outcome: Progressing

## 2023-12-14 NOTE — Progress Notes (Signed)
 PROGRESS NOTE  Tammy Wolfe FMW:[id number] DOB: 1941-05-06 DOA: 11/27/2023 PCP: [name] ORN, MD   LOS: 17 days   Brief Narrative / Interim history: PMH of breast cancer, COPD, HTN, HLD, history of alcohol  abuse and pancreatitis presented to the hospital secondary to abnormal CT findings.  Has shortness of breath for 3 weeks.  Underwent CT scan of the chest outpatient and was found to have pericardial effusion as well as possible lung mass with pleural effusion. Admitted to the ICU.  Cardiology was consulted. 7/4 underwent thoracentesis on right side. 7/7 underwent pericardial drain placement.  Developed A-fib with RVR. 7/9 pericardial drain removed.  Oncology was consulted as the cytology came back positive for pericardial fluid for malignancy. 7/11 transfer to hospitalist service. 7/14.  Repeat thoracentesis performed. 7/15.  After conversation with the patient, currently comfort care.  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  Underwent thoracentesis as well as pericardiocentesis, and cytology of the pericardial fluid came back positive for malignancy, although tissue was not enough to send provide medication or further testing.  She has stage IV malignancy, has very poor  prognosis given poor nutritional status, not a candidate for standard chemotherapy.  Palliative care consulted, she has now been transitioned to comfort. -continue to adjust meds for symptom control / anxiety / pain. -clinically worse  Active problems  New onset atrial fibrillation with RVR - On amiodarone .  Not a candidate for anticoagulation.    Acute respiratory failure with hypoxia. History of COPD/severe emphysema. Recurrent right effusions -Treated with Lasix . Currently needing high flow, 7 L.  Was on IV antibiotics, now comfort care  Severe protein calorie malnutrition. Body mass index is 18.12 kg/m - Placing the patient at a high risk for poor outcome. Likely in the setting of malignancy.   AKI, hyponatremia - Received diuresis.  Serum creatinine improved to 1.3 and worsened again to 1.6. Avoid nephrotoxic medication. Sodium level has been on the lower side. Now comfort care.   Goals of care conversation - Palliative care following. Prognosis is poor in the setting of stage IV malignancy with with poor performance status. Not a candidate for chemotherapy.   Possible ileus, gastric distention -symptomatic management  Scheduled Meds:  amiodarone   200 mg Oral BID   Chlorhexidine  Gluconate Cloth  6 each Topical Daily   feeding supplement  237 mL Oral TID BM   guaiFENesin   600 mg Oral BID   LORazepam   0.5 mg Oral Q6H    morphine  injection  4 mg Intravenous Q3H   nystatin   5 mL Oral QID   polyethylene glycol  17 g Oral Daily   senna-docusate  2 tablet Oral BID   simethicone   80 mg Oral QID   sodium chloride  flush  3 mL Intravenous Q12H   Continuous Infusions: PRN Meds:.acetaminophen  **OR** acetaminophen , ALPRAZolam , antiseptic oral  rinse, artificial tears, bisacodyl , glycopyrrolate  **OR** glycopyrrolate  **OR** glycopyrrolate , haloperidol  **OR** haloperidol  **OR** haloperidol  lactate, ipratropium-albuterol , lip balm, methocarbamol  (ROBAXIN ) injection, midazolam , [DISCONTINUED]  LORazepam  **OR** midazolam , morphine  injection, ondansetron  **OR** ondansetron  (ZOFRAN ) IV, oxyCODONE   Current Outpatient Medications  Medication Instructions   acetaminophen  (TYLENOL ) 650 mg, Every 6 hours PRN   albuterol  (VENTOLIN  HFA) 108 (90 Base) MCG/ACT inhaler 2 puffs, Inhalation, Every 6 hours PRN   ALPRAZolam  (XANAX ) 0.25 MG tablet TAKE ONE TABLET THREE TIMES DAILY AS NEEDED   amiodarone  (PACERONE ) 200 mg, Oral, 2 times daily   cyanocobalamin  1,000 mcg, Daily   cyclobenzaprine  (FLEXERIL ) 5 MG tablet 1 tab by mouth at bedtime as needed for cramps and back pain   Fluticasone-Umeclidin-Vilant (TRELEGY ELLIPTA ) 100-62.5-25 MCG/ACT AEPB 1 puff, Inhalation, Daily   haloperidol  (HALDOL ) 2 mg, Sublingual, Every 4 hours PRN   HYDROcodone -acetaminophen  (NORCO/VICODIN) 5-325 MG tablet 1 tablet, Oral, 2 times daily PRN   LORazepam  (ATIVAN ) 1 mg, Oral, Every 4 hours PRN   losartan  (COZAAR ) 100 mg, Oral, Daily   nystatin  (MYCOSTATIN ) 500,000 Units, Oral, 4 times daily   oxyCODONE  (OXY IR/ROXICODONE ) 5 mg, Oral, Every 4 hours PRN   predniSONE  (DELTASONE ) 10 MG tablet 3 tabs by mouth per day for 3 days,2tabs per day for 3 days,1tab per day for 3 days   trimethoprim  (TRIMPEX ) 100 mg, Oral, Daily   Vitamin D  1,000 Units, Daily    Diet Orders (From admission, onward)     Start     Ordered   12/01/23 1302  Diet regular Room service appropriate? Yes with Assist; Fluid consistency: Thin  Diet effective now       Question Answer Comment  Room service appropriate? Yes with Assist   Fluid consistency: Thin      12/01/23 1301            DVT prophylaxis:    Lab Results  Component Value Date   PLT 413 (H) 12/08/2023      Code Status: Do not attempt resuscitation (DNR) - Comfort care  Family Communication: Daughter present at bedside  Status is: Inpatient Remains inpatient appropriate because: severity of illness  Level of care: Med-Surg  Consultants:  Palliative Cardiology    Objective: Vitals:   12/13/23 1945 12/13/23 2000 12/14/23 0751 12/14/23 0754  BP:  (!) 70/37 117/64   Pulse:  71 94 95  Resp:  17 17   Temp:   97.9 F (36.6 C)   TempSrc:   Axillary   SpO2: 95% 92% (!) 88% 92%  Weight:      Height:        Intake/Output Summary (Last 24 hours) at 12/14/2023 1539 Last data filed at 12/14/2023 1300 Gross per 24 hour  Intake 120 ml  Output 250 ml  Net -130 ml   Wt Readings from Last 3 Encounters:  12/09/23 44.2 kg  11/23/23 36.3 kg  11/10/23 37.6 kg    Examination:  Constitutional: NAD   Data Reviewed: I have independently reviewed following labs and imaging studies   CBC Recent Labs  Lab 12/08/23 0253  WBC 9.8  HGB 11.6*  HCT 37.7  PLT 413*  MCV 83.8  MCH 25.8*  MCHC 30.8  RDW 15.4    Recent Labs  Lab 12/08/23 0253  NA 132*  K 4.8  CL 99  CO2 23  GLUCOSE 141*  BUN 72*  CREATININE 1.51*  CALCIUM 12.1*  MG 3.6*    ------------------------------------------------------------------------------------------------------------------ No results for input(s): CHOL, HDL, LDLCALC, TRIG, CHOLHDL, LDLDIRECT  in the last 72 hours.  Lab Results  Component Value Date   HGBA1C 6.5 11/23/2023   ------------------------------------------------------------------------------------------------------------------ No results for input(s): TSH, T4TOTAL, T3FREE, THYROIDAB in the last 72 hours.  Invalid input(s): FREET3  Cardiac Enzymes No results for input(s): CKMB, TROPONINI, MYOGLOBIN in the last 168 hours.  Invalid input(s): CK ------------------------------------------------------------------------------------------------------------------    Component Value Date/Time   BNP 60.1 11/27/2023 1430    CBG: No results for input(s): GLUCAP in the last 168 hours.  No results found for this or any previous visit (from the past 240 hours).    Radiology Studies: No results found.   Nilda Fendt, MD, PhD [facility]  Between 7 am - 7 pm I am available, please contact me via Amion (for emergencies) or Securechat (non urgent messages)  Between 7 pm - 7 am I am not available, please contact night coverage MD/APP via Amion

## 2023-12-14 NOTE — Progress Notes (Signed)
 [facility] [phone] [facility] Liaison Note  Received request from [facility] manager for family interest in [facility]. Eligibility confirmed. Met with patient and family to confirm interest and explain services.   Patient approved and bed offered for today. Family later advised that patient now may not be stable for transport. Per MD, we will reassess tomorrow.   Thank you for allowing us  to participate in this patient's care.  Eleanor Nail, LPN [facility] Liaison [phone]

## 2023-12-14 NOTE — Progress Notes (Signed)
 Daily Progress Note   Patient Name: Tammy Wolfe       Date: 12/14/2023 DOB: 06-Nov-1940  Age: 83 y.o. MRN#: [id number] Attending Physician: Trixie Nilda HERO, MD Primary Care Physician: [name] ORN, MD Admit Date: 11/27/2023  Reason for Consultation/Follow-up: Establishing goals of care  Length of Stay: 17  Current Medications: Scheduled Meds:   amiodarone   200 mg Oral BID   Chlorhexidine  Gluconate Cloth  6 each Topical Daily   feeding supplement  237 mL Oral TID BM   guaiFENesin   600 mg Oral BID   LORazepam   0.5 mg Oral Q6H    morphine  injection  4 mg Intravenous Q3H   nystatin   5 mL Oral QID   polyethylene glycol  17 g Oral Daily   senna-docusate  2 tablet Oral BID   simethicone   80 mg Oral QID   sodium chloride  flush  3 mL Intravenous Q12H    Continuous Infusions:    PRN Meds: acetaminophen  **OR** acetaminophen , ALPRAZolam , antiseptic oral rinse, artificial tears, bisacodyl , glycopyrrolate  **OR** glycopyrrolate  **OR** glycopyrrolate , haloperidol  **OR** haloperidol  **OR** haloperidol  lactate, ipratropium-albuterol , lip balm, methocarbamol  (ROBAXIN ) injection, midazolam , [DISCONTINUED] LORazepam  **OR** midazolam , morphine  injection, ondansetron  **OR** ondansetron  (ZOFRAN ) IV, oxyCODONE   Physical Exam Vitals reviewed.  Constitutional:      General: She is sleeping. She is not in acute distress.    Appearance: She is cachectic. She is ill-appearing.     Interventions: Nasal cannula in place.  HENT:     Head: Normocephalic and atraumatic.  Pulmonary:     Comments: unlabored Skin:    General: Skin is warm and dry.             Vital Signs: BP 117/64 (BP Location: Right Arm)   Pulse 95   Temp 97.9 F (36.6 C) (Axillary)   Resp 17   Ht 4' 11 (1.499 m)   Wt 44.2 kg    SpO2 92%   BMI 19.68 kg/m  SpO2: SpO2: 92 % O2 Device: O2 Device: Nasal Cannula O2 Flow Rate: O2 Flow Rate (L/min): 3.5 L/min    Patient Active Problem List   Diagnosis Date Noted   Hospice care patient 12/10/2023   Cachexia (HCC) 12/03/2023   Pressure injury of skin 12/03/2023   Protein-calorie malnutrition, severe 12/01/2023   Acute respiratory failure with hypoxia (HCC) 11/30/2023  Mediastinal mass 11/30/2023   Pleural effusion 11/30/2023   Pericardial effusion 11/27/2023   Pleural effusion, bilateral 11/27/2023   Recent unexplained weight loss 11/27/2023   Hoarse voice quality 11/27/2023   Cigarette smoker 11/27/2023   Chronic obstructive pulmonary disease with acute exacerbation (HCC) 11/27/2023   HX: breast cancer 11/27/2023   COPD exacerbation (HCC) 11/23/2023   Urinary frequency 11/11/2023   Tendonitis of right hand 11/13/2022   Skin lesion of chest wall 11/13/2022   B12 deficiency 11/10/2022   Peripheral neuropathy 05/07/2022   Anxiety 05/07/2022   CKD (chronic kidney disease) stage 3, GFR 30-59 ml/min (HCC) 11/04/2021   Nocturnal foot cramps 11/04/2021   Sinusitis 05/01/2021   Tachycardia 05/01/2021   Chronic left maxillary sinusitis 04/04/2021   Encounter for chronic pain management 04/25/2020   Chronic low back pain 03/12/2016   Recurrent UTI 10/10/2015   Dysuria 03/14/2015   COPD (chronic obstructive pulmonary disease) with chronic bronchitis (HCC) 03/07/2014   Coronary artery calcification seen on CT scan 10/31/2013   Hypokalemia 10/19/2013   Former smoker 09/06/2013   Breast cancer of upper-outer quadrant of left female breast (HCC) 03/22/2013   Chronic back pain 04/26/2012   Impaired glucose tolerance 04/26/2012   Anemia, unspecified 04/26/2012   Encounter for well adult exam with abnormal findings 11/13/2010   Vitamin D  deficiency 11/13/2010   ABNORMAL THYROID  FUNCTION TESTS 12/10/2009   Breast implant removal status 12/23/2008   Generalized  anxiety disorder 03/19/2007   FATIGUE 03/19/2007   GOITER, MULTINODULAR 02/14/2007   Hyperlipidemia 02/14/2007   Essential hypertension 02/14/2007   Allergic rhinitis 02/14/2007   Osteoarthritis 02/14/2007   Osteoporosis 02/14/2007   HX, PERSONAL, ALCOHOLISM 02/14/2007   History of digestive system disease 02/14/2007   History of colonic polyps 02/14/2007   ALCOHOLIC HEPATITIS, HX OF 02/14/2007    Palliative Care Assessment & Plan   Patient Profile: 83 y.o. female  with past medical history of breast cancer, emphysema, HTN, HLD, H/o ETOH abuse, ETOH hepatitis and pancreatitis admitted on 11/27/2023 with at the direction of PCP with abnormal CT findings. CT with bilateral moderate pleural effusions as well as pericardial effusion and concern for mediastinal mass. 7/4 underwent R thoracentesis. 7/7 had pericardial drain placed and was removed 7/9. Also with a fib RVR during admission. Pericardial fluid consistent with malignancy. PMT consulted to discuss GOC.     Today's Discussion: Reviewed chart and received update from nursing. Patient did not require as needed medications overnight. She remains on scheduled morphine  and scheduled Ativan . Nurse reports patient had very large bowel movement this morning. Foley placed this morning for patient comfort.  Patient looks the most comfortable she has in days. She is sleeping in no apparent distress. Her breathing is unlabored. Her daughter and grandson are at bedside. They spent the night shared she had a fairly restless night. Yesterday afternoon she became very lethargic and was unable to safely eat or drink anything.  We discussed that her symptoms have likely progressed to the point that she will require medications that will likely make her more drowsy and lethargic. We discussed the possibility that we may need to increase medication dosage or frequency as the patient gets closer to and her end-of-life. We discussed the available as needed  medications including medications for shortness of breath, pain, anxiety, agitation, and secretions.  Encouraged family to notify staff if these are required.  Patient's family spoke with [facility] liaison yesterday. The plan is for the patient to be evaluated for [facility]  today. Discussed with family that if patient is not accepted to [facility] or if bed is not available we will keep patient here at the hospital comfortable.  Family is agreeable to this plan.  Gone from my sight booklet provided. Encouraged family to call PMT if questions or concerns arise.   Recommendations/Plan: DNR/DNI Full comfort measures Medications per [facility] Encouraged patient to use medications for symptom management Continued PMT support   Extensive chart review has been completed prior to seeing the patient including progress/consult notes, orders, medications, and available advance directive documents.  Care plan was discussed with Dr. Trixie, bedside RN, and [facility] liaison  Time spent: 35 minutes  Thank you for allowing the Palliative Medicine Team to assist in the care of this patient.   Stephane CHRISTELLA Palin, NP  Please contact Palliative Medicine Team phone at [phone] for questions and concerns.

## 2023-12-15 DIAGNOSIS — Z7189 Other specified counseling: Secondary | ICD-10-CM | POA: Diagnosis not present

## 2023-12-15 DIAGNOSIS — I3139 Other pericardial effusion (noninflammatory): Secondary | ICD-10-CM | POA: Diagnosis not present

## 2023-12-15 DIAGNOSIS — Z515 Encounter for palliative care: Secondary | ICD-10-CM | POA: Diagnosis not present

## 2023-12-15 MED ORDER — DIAZEPAM 5 MG/ML IJ SOLN
5.0000 mg | INTRAMUSCULAR | Status: DC
  Administered 2023-12-15: 5 mg via INTRAVENOUS
  Filled 2023-12-15: qty 2

## 2023-12-15 NOTE — Discharge Summary (Signed)
 Physician Discharge Summary  Tammy Wolfe FMW:[id number] DOB: 12-Jan-1941 DOA: 11/27/2023  PCP: [name] ORN, MD  Admit date: 11/27/2023 Discharge date: 12/15/2023  Admitted From: home Disposition:  residential hospice  Discharge Condition: stable CODE STATUS: DNR-comfort Diet Orders (From admission, onward)     Start     Ordered   12/01/23 1302  Diet regular Room service appropriate? Yes with Assist; Fluid consistency: Thin  Diet effective now       Question Answer Comment  Room service appropriate? Yes with Assist   Fluid consistency: Thin      12/01/23 1301           Brief Narrative / Interim history: PMH of breast cancer, COPD, HTN, HLD, history of alcohol  abuse and pancreatitis presented to the hospital secondary to abnormal CT findings.  Has shortness of breath for 3 weeks.  Underwent CT scan of the chest outpatient and was found to have pericardial effusion as well as possible lung mass with pleural effusion. Admitted to the ICU.  Cardiology was consulted. 7/4 underwent thoracentesis on right side. 7/7 underwent pericardial drain placement.  Developed A-fib with RVR. 7/9 pericardial drain removed.  Oncology was consulted as the cytology came back positive for pericardial fluid for malignancy. 7/11 transfer to hospitalist service. 7/14.  Repeat thoracentesis performed. 7/15.  After conversation with the patient, currently comfort care.  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  Underwent thoracentesis as well as pericardiocentesis, and cytology of the pericardial fluid came back positive for malignancy, although tissue was not enough to send provide medication or further testing.  She has stage IV malignancy, has very poor prognosis given poor nutritional status, not a candidate for standard chemotherapy.  Palliative care consulted, she has now been transitioned to comfort.   Active problems  New onset atrial fibrillation with RVR  Acute respiratory failure with hypoxia. History of COPD/severe emphysema. Recurrent right effusions  Severe protein calorie malnutrition. Body mass index is 18.12 kg/m   AKI, hyponatremia    Possible ileus, gastric distention -symptomatic management  Sepsis ruled out   Discharge Instructions   Allergies as of 12/15/2023       Reactions   Tetracycline Anaphylaxis   Lovastatin  Other (See Comments)   Muscle cramps   Pravastatin  Other (See Comments)   Leg cramps        Medication List     STOP taking these medications    ALPRAZolam  0.25 MG tablet Commonly known as: XANAX    azithromycin  250 MG tablet Commonly known as: ZITHROMAX    cyanocobalamin  1000 MCG tablet   HYDROcodone  bit-homatropine 5-1.5 MG/5ML syrup Commonly known as: HYCODAN   HYDROcodone -acetaminophen  5-325 MG tablet Commonly known as: NORCO/VICODIN   losartan  100 MG tablet Commonly known as: COZAAR    predniSONE  10 MG tablet Commonly known as: DELTASONE    trimethoprim  100 MG tablet Commonly known as: TRIMPEX        TAKE  these medications    acetaminophen  325 MG tablet Commonly known as: TYLENOL  Take 650 mg by mouth every 6 (six) hours as needed for mild pain (pain score 1-3).   albuterol  108 (90 Base) MCG/ACT inhaler Commonly known as: VENTOLIN  HFA Inhale 2 puffs into the lungs every 6 (six)  hours as needed for wheezing or shortness of breath.   amiodarone  200 MG tablet Commonly known as: PACERONE  Take 1 tablet (200 mg total) by mouth 2 (two) times daily.   cyclobenzaprine  5 MG tablet Commonly known as: FLEXERIL  1 tab by mouth at bedtime as needed for cramps and back pain   haloperidol  2 MG/ML solution Commonly known as: HALDOL  Place 1 mL (2 mg total) under the tongue every 4 (four) hours as needed for agitation (or delirium).   LORazepam  2 MG/ML concentrated solution Commonly known as: ATIVAN  Take 0.5 mLs (1 mg total) by mouth every 4 (four) hours as needed for anxiety.   nystatin  100000 UNIT/ML suspension Commonly known as: MYCOSTATIN  Take 5 mLs (500,000 Units total) by mouth 4 (four) times daily.   oxyCODONE  5 MG immediate release tablet Commonly known as: Oxy IR/ROXICODONE  Take 1 tablet (5 mg total) by mouth every 4 (four) hours as needed for moderate pain (pain score 4-6) (or dyspnea).   Trelegy Ellipta  100-62.5-25 MCG/ACT Aepb Generic drug: Fluticasone-Umeclidin-Vilant Inhale 1 puff into the lungs daily.   Vitamin D  1000 units capsule Take 1,000 Units by mouth daily.       Procedures/Studies: none  DG Abd Portable 1V Result Date: 12/08/2023 CLINICAL DATA:  Constipation EXAM: PORTABLE ABDOMEN - 1 VIEW COMPARISON:  12/06/2023 FINDINGS: Mild gaseous distention of the stomach. Large and small bowel nondilated. No organomegaly or free air. Visualized lung bases clear. IMPRESSION: Mild gaseous distention of the stomach. No evidence of bowel obstruction or free air. Electronically Signed   By: Franky Crease M.D.   On: 12/08/2023 12:39   IR THORACENTESIS ASP PLEURAL SPACE W/IMG GUIDE Result Date: 12/07/2023 INDICATION: 83 year old female with female previous medical history of breast cancer, with malignant pleural and pericardial effusion, shortness of breath. IR requested for diagnostic and therapeutic right-sided thoracentesis. EXAM: ULTRASOUND GUIDED DIAGNOSTIC  AND THERAPEUTIC RIGHT-SIDED THORACENTESIS MEDICATIONS: 6 cc of 1% lidocaine  COMPLICATIONS: None immediate. PROCEDURE: An ultrasound guided thoracentesis was thoroughly discussed with the patient and questions answered. The benefits, risks, alternatives and complications were also discussed. The patient understands and wishes to proceed with the procedure. Written consent was obtained. Ultrasound was performed to localize and mark an adequate pocket of fluid in the right chest. The area was then prepped and draped in the normal sterile fashion. 1% Lidocaine  was used for local anesthesia. Under ultrasound guidance a 6 Fr Safe-T-Centesis catheter was introduced. Thoracentesis was performed. The catheter was removed and a dressing applied. FINDINGS: A total of approximately 300 mL of clear, straw-colored pleural fluid was removed. Samples were sent to the laboratory as requested by the clinical team. IMPRESSION: Successful ultrasound guided right thoracentesis yielding 300 mL of pleural fluid. Procedure performed by Carlin Griffon, PA-C Electronically Signed   By: Juliene Balder M.D.   On: 12/07/2023 15:31   DG CHEST PORT 1 VIEW Result Date: 12/07/2023 CLINICAL DATA:  Recurrent right pleural effusion EXAM: PORTABLE CHEST 1 VIEW COMPARISON:  Chest x-ray 12/05/2023 and older FINDINGS: Persistent right-sided pleural effusion with adjacent opacity. Persistent left retrocardiac opacity. Hyperinflation with chronic lung changes. Apical pleural thickening. Stable cardiopericardial silhouette with tortuous ectatic aorta. No pneumothorax. Overlapping cardiac leads. Diffuse  degenerative changes. Surgical clips along the left axillary region. IMPRESSION: No significant interval change when adjusted for technique. Electronically Signed   By: Ranell Bring M.D.   On: 12/07/2023 14:10   DG Chest 1 View Result Date: 12/07/2023 CLINICAL DATA:  Thoracentesis EXAM: CHEST  1 VIEW COMPARISON:  Chest x-ray performed December 07, 2023 FINDINGS:  Small right residual pleural effusion. No pneumothorax. Mild central congestion. IMPRESSION: 1. No pneumothorax following thoracentesis. Electronically Signed   By: Maude Naegeli M.D.   On: 12/07/2023 13:55   DG Abd Portable 1V Result Date: 12/06/2023 CLINICAL DATA:  Gastric outlet obstruction. EXAM: PORTABLE ABDOMEN - 1 VIEW COMPARISON:  December 05, 2023 and prior studies FINDINGS: Interval decrease in gaseous distention of the stomach. There is persistent gaseous distension of small and large bowel loops, slightly improved from prior. IMPRESSION: Interval decrease in gaseous distention of the stomach. Multiple gas distended loops of small and large bowel, may be related to adynamic ileus, slightly improved from prior. Electronically Signed   By: Michaeline Blanch M.D.   On: 12/06/2023 15:43   DG CHEST PORT 1 VIEW Result Date: 12/05/2023 CLINICAL DATA:  Shortness of breath. EXAM: PORTABLE CHEST 1 VIEW COMPARISON:  Radiograph 12/03/2023 FINDINGS: Increasing volume loss at the right lung base with ill-defined opacity. Again seen medial left lung base atelectasis. Technically limited due to patient rotation. Stable heart size and mediastinal contours. There may be a right pleural effusion. No pneumothorax. IMPRESSION: 1. Increasing volume loss at the right lung base with ill-defined opacity, may represent atelectasis/partial lobar collapse or pneumonia. Possible right pleural effusion. 2. Medial left lung base atelectasis. Electronically Signed   By: Andrea Gasman M.D.   On: 12/05/2023 16:07   DG Abd Portable 1V Result Date: 12/05/2023 CLINICAL DATA:  Constipation. EXAM: PORTABLE ABDOMEN - 1 VIEW COMPARISON:  None Available. FINDINGS: Portable supine view of the abdomen obtained. The low pelvis and upper most abdomen are not included in the field of view. There is a moderate volume of stool throughout the colon. Suspected stool within the rectum, although not entirely included in the field of view. Gaseous gastric  distention in the upper abdomen. Increased air within small bowel centrally. No obvious radiopaque calculi. IMPRESSION: 1. Moderate colonic stool burden. 2. Gaseous gastric distention in the upper abdomen. Increased air within small bowel centrally. This may represent ileus. Electronically Signed   By: Andrea Gasman M.D.   On: 12/05/2023 16:05   ECHOCARDIOGRAM LIMITED Result Date: 12/03/2023    ECHOCARDIOGRAM LIMITED REPORT   Patient Name:   SENORA LACSON Date of Exam: 12/03/2023 Medical Rec #:  [id number]    Height:       59.0 in Accession #:    [id number]   Weight:       75.4 lb Date of Birth:  1940/07/06     BSA:          1.218 m Patient Age:    83 years     BP:           107/87 mmHg Patient Gender: F            HR:           80 bpm. Exam Location:  Inpatient Procedure: Limited Echo (Both Spectral and Color Flow Doppler were utilized            during procedure). Indications:    Pericardial Effusion  History:        Patient has prior history of Echocardiogram  examinations, most                 recent 12/01/2023. Pericardial Disease, COPD; Risk                 Factors:Hypertension.  Sonographer:    Jayson Gaskins Referring Phys: [id number] LINDSAY NICOLE FINCH IMPRESSIONS  1. Left ventricular ejection fraction, by estimation, is 70 to 75%. The left ventricle has hyperdynamic function. The left ventricle has no regional wall motion abnormalities. There is moderate asymmetric left ventricular hypertrophy of the basal-septal  segment. Left ventricular diastolic function could not be evaluated.  2. Right ventricular systolic function is normal. The right ventricular size is normal.  3. No significant effusion.  4. The mitral valve is grossly normal.  5. The aortic valve is tricuspid. There is mild calcification of the aortic valve. Aortic valve sclerosis/calcification is present, without any evidence of aortic stenosis.  6. The inferior vena cava is normal in size with greater than 50% respiratory variability, suggesting  right atrial pressure of 3 mmHg. FINDINGS  Left Ventricle: Left ventricular ejection fraction, by estimation, is 70 to 75%. The left ventricle has hyperdynamic function. The left ventricle has no regional wall motion abnormalities. The left ventricular internal cavity size was normal in size. There is moderate asymmetric left ventricular hypertrophy of the basal-septal segment. Left ventricular diastolic function could not be evaluated. Right Ventricle: The right ventricular size is normal. No increase in right ventricular wall thickness. Right ventricular systolic function is normal. Left Atrium: Left atrial size was normal in size. Right Atrium: Right atrial size was normal in size. Pericardium: No significant effusion. There is no evidence of pericardial effusion. Mitral Valve: The mitral valve is grossly normal. Aortic Valve: The aortic valve is tricuspid. There is mild calcification of the aortic valve. Aortic valve sclerosis/calcification is present, without any evidence of aortic stenosis. Aorta: The aortic root was not well visualized. Venous: The left upper pulmonary vein is normal. The inferior vena cava is normal in size with greater than 50% respiratory variability, suggesting right atrial pressure of 3 mmHg. IAS/Shunts: The interatrial septum was not assessed. LEFT VENTRICLE PLAX 2D LVIDd:         2.50 cm LVIDs:         1.80 cm LV PW:         0.80 cm LV IVS:        0.70 cm  LEFT ATRIUM             Index LA Vol (A2C):   18.4 ml 15.10 ml/m LA Vol (A4C):   24.7 ml 20.27 ml/m LA Biplane Vol: 21.5 ml 17.65 ml/m Toribio Fuel MD Electronically signed by Toribio Fuel MD Signature Date/Time: 12/03/2023/6:36:38 PM    Final    DG Chest Port 1 View Result Date: 12/03/2023 CLINICAL DATA:  Pleural effusion. EXAM: PORTABLE CHEST 1 VIEW COMPARISON:  Chest pain and dated 12/01/2023. FINDINGS: Background of emphysema. Left lung base atelectasis. No focal consolidation, pleural effusion, or pneumothorax.  Stable cardiac silhouette. No acute osseous pathology. IMPRESSION: Left lung base atelectasis. No focal consolidation. Electronically Signed   By: Vanetta Chou M.D.   On: 12/03/2023 16:52   ECHOCARDIOGRAM LIMITED Result Date: 12/01/2023    ECHOCARDIOGRAM LIMITED REPORT   Patient Name:   Ioana Louks Date of Exam: 12/01/2023 Medical Rec #:  [id number]    Height:       59.0 in Accession #:    [id number]   Weight:  80.0 lb Date of Birth:  April 17, 1941     BSA:          1.249 m Patient Age:    83 years     BP:           108/70 mmHg Patient Gender: F            HR:           114 bpm. Exam Location:  Inpatient Procedure: Limited Color Doppler and Cardiac Doppler (Both Spectral and Color            Flow Doppler were utilized during procedure). Indications:    Pericardial effusion  History:        Patient has prior history of Echocardiogram examinations, most                 recent 11/30/2023.  Sonographer:    Benard Stallion Referring Phys: LONNI END IMPRESSIONS  1. LVOT flow acceleration.. Left ventricular ejection fraction, by estimation, is 70 to 75%. The left ventricle has hyperdynamic function. There is severe asymmetric left ventricular hypertrophy of the basal-septal segment. Indeterminate diastolic filling due to E-A fusion.  2. Right ventricular systolic function is hyperdynamic. The right ventricular size is normal.  3. The mitral valve is abnormal. No evidence of mitral valve regurgitation. No evidence of mitral stenosis. There is mild holosystolic prolapse of both leaflets of the mitral valve.  4. The aortic valve is tricuspid. Aortic valve regurgitation is not visualized. No aortic stenosis is present. Comparison(s): Resolution of pericardial effusion. FINDINGS  Left Ventricle: LVOT flow acceleration. Left ventricular ejection fraction, by estimation, is 70 to 75%. The left ventricle has hyperdynamic function. There is severe asymmetric left ventricular hypertrophy of the basal-septal segment.  Indeterminate diastolic filling due to E-A fusion. Right Ventricle: The right ventricular size is normal. No increase in right ventricular wall thickness. Right ventricular systolic function is hyperdynamic. Pericardium: There is no evidence of pericardial effusion. Mitral Valve: The mitral valve is abnormal. There is mild holosystolic prolapse of both leaflets of the mitral valve. No evidence of mitral valve stenosis. Tricuspid Valve: The tricuspid valve is normal in structure. Tricuspid valve regurgitation is not demonstrated. No evidence of tricuspid stenosis. Aortic Valve: The aortic valve is tricuspid. Aortic valve regurgitation is not visualized. No aortic stenosis is present. Aortic valve mean gradient measures 2.5 mmHg. Aortic valve peak gradient measures 4.7 mmHg. Aortic valve area, by VTI measures 3.35 cm. Pulmonic Valve: The pulmonic valve was not well visualized. Pulmonic valve regurgitation is not visualized. No evidence of pulmonic stenosis. Aorta: The aortic root is normal in size and structure. LEFT VENTRICLE PLAX 2D LVIDd:         3.00 cm LVIDs:         1.90 cm LV PW:         0.90 cm LV IVS:        0.80 cm LVOT diam:     2.00 cm LV SV:         51 LV SV Index:   40 LVOT Area:     3.14 cm  RIGHT VENTRICLE RV S prime:     19.00 cm/s TAPSE (M-mode): 1.3 cm LEFT ATRIUM         Index LA diam:    2.00 cm 1.60 cm/m  AORTIC VALVE AV Area (Vmax):    3.16 cm AV Area (Vmean):   3.15 cm AV Area (VTI):     3.35 cm AV Vmax:  108.50 cm/s AV Vmean:          72.000 cm/s AV VTI:            0.151 m AV Peak Grad:      4.7 mmHg AV Mean Grad:      2.5 mmHg LVOT Vmax:         109.00 cm/s LVOT Vmean:        72.200 cm/s LVOT VTI:          0.161 m LVOT/AV VTI ratio: 1.07  AORTA Ao Root diam: 3.10 cm MITRAL VALVE MV Area (PHT): 6.54 cm     SHUNTS MV Decel Time: 116 msec     Systemic VTI:  0.16 m MV E velocity: 55.50 cm/s   Systemic Diam: 2.00 cm MV A velocity: 109.00 cm/s MV E/A ratio:  0.51 Stanly Leavens MD Electronically signed by Stanly Leavens MD Signature Date/Time: 12/01/2023/9:45:42 AM    Final    DG CHEST PORT 1 VIEW Result Date: 12/01/2023 CLINICAL DATA:  Pleural effusion. EXAM: PORTABLE CHEST 1 VIEW COMPARISON:  11/30/2023 FINDINGS: Trace left pleural effusion. No pulmonary edema or focal consolidation. Interstitial markings are diffusely coarsened with chronic features. Cardiopericardial silhouette is at upper limits of normal for size. There is a small bore catheter overlying the left heart border similar to prior. IMPRESSION: Trace left pleural effusion. Electronically Signed   By: Camellia Candle M.D.   On: 12/01/2023 07:54   DG CHEST PORT 1 VIEW Result Date: 11/30/2023 CLINICAL DATA:  [id number] S/P pericardiocentesis [id number] EXAM: PORTABLE CHEST 1 VIEW COMPARISON:  11/30/2023, 5:42 a.m. FINDINGS: Bilateral lungs appear hyperlucent with coarse bronchovascular markings, in keeping with COPD. Bilateral lungs otherwise appear clear. No dense consolidation or lung collapse. Bilateral costophrenic angles are clear. Stable cardio-mediastinal silhouette. Since the prior study, there is new tubing overlying the left heart shadow, which may represent pericardial drainage catheter. Correlate with history. No pneumomediastinum noted. No acute osseous abnormalities. The soft tissues are within normal limits. IMPRESSION: No active disease. COPD. Electronically Signed   By: Ree Molt M.D.   On: 11/30/2023 15:59   ECHOCARDIOGRAM LIMITED Result Date: 11/30/2023    ECHOCARDIOGRAM LIMITED REPORT   Patient Name:   HILLERY ZACHMAN Date of Exam: 11/30/2023 Medical Rec #:  [id number]    Height:       59.0 in Accession #:    [id number]   Weight:       80.0 lb Date of Birth:  1940-07-29     BSA:          1.249 m Patient Age:    83 years     BP:           141/71 mmHg Patient Gender: F            HR:           127 bpm. Exam Location:  Inpatient Procedure: Limited Echo (Both Spectral and Color Flow Doppler were  utilized            during procedure). Indications:    I31.3 Pericardial effusion (noninflammatory)  History:        Patient has prior history of Echocardiogram examinations, most                 recent 11/28/2023. Abnormal ECG, COPD, Arrythmias:Tachycardia,                 Signs/Symptoms:Shortness of Breath and Dyspnea; Risk  Factors:Current Smoker. Pericardial effusion. Early tamponade.                 H/O breast cancer. ETOH.  Sonographer:    Ellouise Mose RDCS Referring Phys: [phone] CHRISTOPHER END  Sonographer Comments: Image acquisition challenging due to mastectomy. Pericardiocentesis procedure. Patient with extremely thin habitus. IMPRESSIONS  1. Left ventricular ejection fraction, by estimation, is 60 to 65%. The left ventricle has normal function.  2. Moderate pericardial effusion slightly smaller than effusion seen on TTE 11/28/23 no evidence of tamponade . Moderate pericardial effusion. FINDINGS  Left Ventricle: Left ventricular ejection fraction, by estimation, is 60 to 65%. The left ventricle has normal function. The left ventricular internal cavity size was normal in size. Pericardium: Moderate pericardial effusion slightly smaller than effusion seen on TTE 11/28/23 no evidence of tamponade. A moderately sized pericardial effusion is present. Maude Emmer MD Electronically signed by Maude Emmer MD Signature Date/Time: 11/30/2023/11:17:08 AM    Final    CARDIAC CATHETERIZATION Result Date: 11/30/2023 Conclusions: Moderate-sized pericardial effusion by preprocedure bedside echocardiogram. Successful pericardiocentesis and pericardial drain placement from a subxiphoid approach, yielding 225 mL of serous (straw-colored) fluid. Recommendations: Maintain drain to negative pressure with attached negative pressure drain.  Record output every 4 hours. Perform chest radiograph to exclude pneumothorax and confirm placement. Repeat limited echocardiogram tomorrow morning to assess for reaccumulation of  pericardial effusion.  If no significant reaccumulation and drain output less than 100 mL in 24 hours, drain can be removed. Lonni Hanson, MD [facility]  DG CHEST PORT 1 VIEW Result Date: 11/30/2023 CLINICAL DATA:  Pleural effusion EXAM: PORTABLE CHEST 1 VIEW COMPARISON:  Prior chest x-ray 11/29/2023 FINDINGS: Cardiac and mediastinal contours are unchanged. Similar appearance of soft tissue density within the mediastinum. Atherosclerotic calcifications again noted in the transverse aorta. Similar appearance of biapical pleuroparenchymal scarring. The lungs appear hyperinflated with diffuse bronchitic changes and areas of lucency likely reflecting underlying emphysema. Trace left pleural effusion. No pneumothorax. IMPRESSION: 1. Trace left pleural effusion. 2. Similar appearance of the lungs which appear hyperinflated with coarse bronchitic changes and areas of lucency suggesting a combination of emphysema and chronic bronchitis. 3. Aortic atherosclerotic vascular calcifications. 4. Similar appearance of increased soft tissue density within the upper mediastinum. Electronically Signed   By: Wilkie Lent M.D.   On: 11/30/2023 07:48   US  THORACENTESIS ASP PLEURAL SPACE W/IMG GUIDE Result Date: 11/29/2023 INDICATION: 83 year old female with history of COPD presented with shortness of breast. Previous imaging showed bilateral pleural effusion. Request for therapeutic and diagnostic thoracentesis. EXAM: ULTRASOUND GUIDED RIGHT THORACENTESIS MEDICATIONS: 5 mL 1% lidocaine  COMPLICATIONS: SIR Level A - No therapy, no consequence. PROCEDURE: An ultrasound guided thoracentesis was thoroughly discussed with the patient and questions answered. The benefits, risks, alternatives and complications were also discussed. The patient understands and wishes to proceed with the procedure. Written consent was obtained. Ultrasound was performed to localize and mark an adequate pocket of fluid in the right chest. The area  was then prepped and draped in the normal sterile fashion. 1% Lidocaine  was used for local anesthesia. Under ultrasound guidance a 6 Fr Safe-T-Centesis catheter was introduced. Thoracentesis was performed. The catheter was removed and a dressing applied. FINDINGS: A total of approximately 400 mL of hazy amber fluid was removed. Samples were sent to the laboratory as requested by the clinical team. Post-procedure chest x-ray showed small apical right pneumothorax. Chest x-ray was repeated in 1 hour, which showed resolution of the apical pneumothorax. IMPRESSION: Successful ultrasound guided  right thoracentesis yielding 400 mL of pleural fluid. Performed by: Aimee Han, PA-C Electronically Signed   By: Cordella Banner   On: 11/29/2023 16:07   DG Chest Port 1 View Result Date: 11/29/2023 CLINICAL DATA:  Evaluate for pneumothorax. Shortness of breath and pericardial effusion. EXAM: PORTABLE CHEST 1 VIEW COMPARISON:  11/29/2023 FINDINGS: Heart size is normal. Similar appearance of widened superior mediastinum compatible with known infiltrative mass. Trace pleural effusions. Previous small right pneumothorax is not seen on the current exam. No interstitial edema or airspace disease. IMPRESSION: 1. Previous small right pneumothorax is not seen on the current exam. 2. Trace pleural effusions. 3. Similar appearance of widened superior mediastinum compatible with known infiltrative mass. Electronically Signed   By: Waddell Calk M.D.   On: 11/29/2023 12:11   DG Chest 1 View Result Date: 11/29/2023 CLINICAL DATA:  Pleural effusion status post thoracentesis EXAM: CHEST  1 VIEW COMPARISON:  11/28/2023 FINDINGS: Single frontal view of the chest demonstrates a stable cardiac silhouette. Continued ectasia and atherosclerosis of the thoracic aorta. Stable widening of the upper mediastinum consistent with infiltrative soft tissue mass seen on preceding CT. Decreased right pleural effusion after interval thoracentesis. Trace  right apical pneumothorax volume estimated far less than 5%, with pleural separation measuring approximately 5 mm. Stable trace left pleural effusion. No acute bony abnormalities. IMPRESSION: 1. Near complete resolution of right pleural effusion after interval thoracentesis. 2. Trace right apical pneumothorax, volume estimated far less than 5%. 3. Stable trace left pleural effusion. 4. Stable mediastinal widening compatible with known infiltrative soft tissue mass. Critical Value/emergent results were called by telephone at the time of interpretation on 11/29/2023 at 11:04 am to provider [facility] , who verbally acknowledged these results. Electronically Signed   By: Ozell Daring M.D.   On: 11/29/2023 11:12   DG CHEST PORT 1 VIEW Result Date: 11/28/2023 CLINICAL DATA:  83 year old female with hypoxia and cough. EXAM: PORTABLE CHEST 1 VIEW COMPARISON:  Portable chest yesterday and earlier. FINDINGS: Portable AP semi upright view at 0824 hours. Large lung volumes, centrilobular emphysema on recent CTA. Bilateral pleural effusions. Stable cardiac contour. Abnormal superior mediastinum, tumor or metastatic ex nodal infiltration on recent CTA. Calcified aortic atherosclerosis. No pneumothorax or pulmonary edema. Stable ventilation since yesterday. Negative visible bowel gas.  Stable visualized osseous structures. IMPRESSION: 1. 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 See Chest CTA details 11/26/2023. 2. Stable ventilation with bilateral pleural effusions superimposed on emphysema. Electronically Signed   By: VEAR Hurst M.D.   On: 11/28/2023 12:15   ECHOCARDIOGRAM LIMITED Result Date: 11/28/2023    ECHOCARDIOGRAM LIMITED REPORT   Patient Name:   YURANI FETTES Date of Exam: 11/28/2023 Medical Rec #:  [id number]    Height:       59.0 in Accession #:    [id number]   Weight:       80.0 lb Date of Birth:  11-30-1940     BSA:          1.249 m Patient Age:    83 years     BP:           141/92 mmHg  Patient Gender: F            HR:           112 bpm. Exam Location:  Inpatient Procedure: 2D Echo, Limited Echo, Cardiac Doppler and Color Doppler (Both            Spectral and Color Flow Doppler  were utilized during procedure). Indications:    Pericardial effusion  History:        Patient has prior history of Echocardiogram examinations, most                 recent 11/27/2023. COPD; Risk Factors:Hypertension and                 Dyslipidemia.  Sonographer:    Therisa Crouch Referring Phys: [id number] SUNIT TOLIA IMPRESSIONS  1. Limited echo to evaluate pericardial effusion. Moderate to large pericardial effusion, measures 1.9cm adjacent to RV. There is RV indentation during early diastole but no clear RV diastolic collapse. IVC is small with <50% respiratory variation. Not consistent with tamponade at this time but would monitor closely for clinical signs of tamponade and continue to monitor serial echoes FINDINGS  Left Ventricle: Pericardium: Limited echo to evaluate pericardial effusion. Moderate to large pericardial effusion, measures 1.9cm adjacent to RV. There is RV indentation durign early diastole but no clear RV diastolic collapse. IVC is small with <50% respiratory variation. Not consistent with tamponade at this time but would monitor closely for clinical signs of tamponade and suggest repeat echo in 48 hours. IVC IVC diam: 1.20 cm Lonni Nanas MD Electronically signed by Lonni Nanas MD Signature Date/Time: 11/28/2023/11:47:53 AM    Final    ECHOCARDIOGRAM COMPLETE Result Date: 11/27/2023    ECHOCARDIOGRAM REPORT   Patient Name:   AKYA FIORELLO Date of Exam: 11/27/2023 Medical Rec #:  [id number]    Height:       59.0 in Accession #:    [id number]   Weight:       80.0 lb Date of Birth:  13-Sep-1940     BSA:          1.249 m Patient Age:    83 years     BP:           146/90 mmHg Patient Gender: F            HR:           123 bpm. Exam Location:  Inpatient Procedure: 2D Echo (Both Spectral and Color Flow  Doppler were utilized during            procedure). STAT ECHO Indications:    pericardial effusion  History:        Patient has no prior history of Echocardiogram examinations.                 COPD and chronic kidney disease, Signs/Symptoms:Shortness of                 Breath; Risk Factors:Hypertension and Dyslipidemia.  Sonographer:    Tinnie Barefoot RDCS Referring Phys: [id number] GLENDIA DASEN WEAVER IMPRESSIONS  1. Left ventricular ejection fraction, by estimation, is >75%. The left ventricle has hyperdynamic function. The left ventricle has no regional wall motion abnormalities. Left ventricular diastolic function could not be evaluated.  2. Right ventricular systolic function is normal. The right ventricular size is normal.  3. Moderate size pericardial effusion, circumferentially, predominantly located anterior to right heart chambers and apex. As per the inflow variations, IVC size, no obvious evidence of tamponade physiology. Recommend limited echocardiogram in 24 hours to reevaluate size of pericardial effusion and its hemodynamic significance.  4. The mitral valve is degenerative. Trivial mitral valve regurgitation. No evidence of mitral stenosis.  5. The aortic valve is tricuspid. Aortic valve regurgitation is not visualized. Aortic valve sclerosis is present, with no evidence of aortic  valve stenosis.  6. The inferior vena cava is normal in size with <50% respiratory variability, suggesting right atrial pressure of 8 mmHg. Comparison(s): No prior Echocardiogram. Conclusion(s)/Recommendation(s): Repeat Limited echo in 24hr to evaluate the pericardial effusion and its hemodynamic significance. FINDINGS  Left Ventricle: Left ventricular ejection fraction, by estimation, is >75%. The left ventricle has hyperdynamic function. The left ventricle has no regional wall motion abnormalities. The left ventricular internal cavity size was small. There is no left  ventricular hypertrophy. Left ventricular diastolic function  could not be evaluated due to nondiagnostic images. Left ventricular diastolic function could not be evaluated. Right Ventricle: The right ventricular size is normal. No increase in right ventricular wall thickness. Right ventricular systolic function is normal. Left Atrium: Left atrial size was normal in size. Right Atrium: Right atrial size was normal in size. Pericardium: Moderate size pericardial effusion, circumferentially, predominantly located anterior to right heart chambers and apex. As per the inflow variations, IVC size, no obvious evidence of tamponade physiology. Recommend limited echocardiogram in 24 hours to reevaluate size of pericardial effusion and its hemodynamic significance. Mitral Valve: The mitral valve is degenerative in appearance. Mild mitral annular calcification. Trivial mitral valve regurgitation. No evidence of mitral valve stenosis. Tricuspid Valve: The tricuspid valve is grossly normal. Tricuspid valve regurgitation is not demonstrated. No evidence of tricuspid stenosis. Aortic Valve: The aortic valve is tricuspid. Aortic valve regurgitation is not visualized. Aortic valve sclerosis is present, with no evidence of aortic valve stenosis. Pulmonic Valve: The pulmonic valve was grossly normal. Pulmonic valve regurgitation is not visualized. Aorta: The ascending aorta was not well visualized. Venous: The inferior vena cava is normal in size with less than 50% respiratory variability, suggesting right atrial pressure of 8 mmHg. IAS/Shunts: The interatrial septum was not well visualized.  LEFT VENTRICLE PLAX 2D LVIDd:         3.50 cm LVIDs:         2.00 cm LV PW:         1.00 cm LV IVS:        0.80 cm LVOT diam:     1.90 cm LV SV:         27 LV SV Index:   21 LVOT Area:     2.84 cm  RIGHT VENTRICLE             IVC RV Basal diam:  2.30 cm     IVC diam: 1.50 cm RV S prime:     22.80 cm/s TAPSE (M-mode): 2.4 cm LEFT ATRIUM           Index        RIGHT ATRIUM          Index LA diam:      2.40  cm 1.92 cm/m   RA Area:     9.22 cm LA Vol (A4C): 15.8 ml 12.65 ml/m  RA Volume:   19.00 ml 15.21 ml/m  AORTIC VALVE LVOT Vmax:   73.70 cm/s LVOT Vmean:  48.000 cm/s LVOT VTI:    0.095 m  AORTA Ao Asc diam: 2.80 cm MV E velocity: 60.90 cm/s MV A velocity: 122.00 cm/s  SHUNTS MV E/A ratio:  0.50         Systemic VTI:  0.09 m                             Systemic Diam: 1.90 cm Sunit Product manager signed by Madonna Large  Signature Date/Time: 11/27/2023/6:31:40 PM    Final    DG Chest Port 1 View Result Date: 11/27/2023 CLINICAL DATA:  Shortness of breath. EXAM: PORTABLE CHEST 1 VIEW COMPARISON:  11/23/2023, 11/26/2023. FINDINGS: The heart size and mediastinal contours are stable. Atherosclerotic calcification of the aorta is noted. Widening of the mediastinum is unchanged. Emphysematous changes are present in the lungs. There is a small pleural effusion on the right a moderate pleural effusion on the left with atelectasis at the lung bases. No pneumothorax is seen. No acute osseous abnormality. Surgical clips are present in the left axilla. IMPRESSION: 1. Moderate pleural effusion on the right and small pleural effusion on the left with atelectasis at the lung bases. 2. Emphysema. Electronically Signed   By: Leita Birmingham M.D.   On: 11/27/2023 15:15   CT Angio Chest W/Cm &/Or Wo Cm Result Date: 11/26/2023 CLINICAL DATA:  Concern for pulmonary embolism. EXAM: CT ANGIOGRAPHY CHEST WITH CONTRAST TECHNIQUE: Multidetector CT imaging of the chest was performed using the standard protocol during bolus administration of intravenous contrast. Multiplanar CT image reconstructions and MIPs were obtained to evaluate the vascular anatomy. RADIATION DOSE REDUCTION: This exam was performed according to the departmental dose-optimization program which includes automated exposure control, adjustment of the mA and/or kV according to patient size and/or use of iterative reconstruction technique. CONTRAST:  60mL OMNIPAQUE   IOHEXOL  300 MG/ML  SOLN COMPARISON:  Chest radiograph dated 11/23/2023. FINDINGS: Evaluation is limited due to cachexia and severe streak artifact caused by contrast as well as respiratory motion. Cardiovascular: There is no cardiomegaly. Moderate pericardial effusion measuring 18 mm in thickness and new since the prior CT. Correlation with echocardiogram recommended to exclude cardiac tamponade. There is moderate atherosclerotic calcification of the thoracic aorta. The aorta is tortuous. No aneurysmal dilatation or dissection. The origins of the great vessels of the aortic arch appear patent. No pulmonary artery embolus identified. Mediastinum/Nodes: Ill-defined infiltrative soft tissue throughout the mediastinum. There is an ill-defined masslike area in the mediastinum with splaying of the central pulmonary arteries measuring 3 x 4 cm (63/5). There is lobulated thickening of the soft tissues in the pre-vascular space as well as right suprahilar mediastinum. The esophagus is poorly visualized. Lungs/Pleura: Moderate bilateral pleural effusions with partial compressive atelectasis of the lower lobes. There is background of emphysema. Streaky and nodular density extending from the right hilum to the right apex. A nodular component measures 1.7 x 0.5 cm and new since the prior CT. There is no pneumothorax. Secretions noted in the right mainstem bronchus. The central airways are patent. Upper Abdomen: No acute abnormality. Musculoskeletal: Osteopenia with degenerative changes of the spine. Cachexia. No acute osseous pathology. Review of the MIP images confirms the above findings. IMPRESSION: 1. No CT evidence of pulmonary artery embolus. 2. Moderate bilateral pleural effusions with partial compressive atelectasis of the lower lobes. 3. Moderate pericardial effusion, new since the prior CT. Correlation with echocardiogram recommended to exclude cardiac tamponade. 4. Ill-defined infiltrative soft tissue throughout the  mediastinum with an ill-defined masslike area in the mediastinum with splaying of the central pulmonary arteries. Findings are concerning for neoplastic process. 5. Streaky and nodular density extending from the right hilum to the right apex, new since the prior CT. Multidisciplinary consult is advised. 6. Aortic Atherosclerosis (ICD10-I70.0) and Emphysema (ICD10-J43.9). Electronically Signed   By: Vanetta Chou M.D.   On: 11/26/2023 15:35   DG Chest 2 View Result Date: 11/24/2023 CLINICAL DATA:  Shortness of breath and hoarseness since November 10, 2023. EXAM: CHEST - 2 VIEW COMPARISON:  Chest x-ray December 06, 2010 FINDINGS: Slightly widened mediastinal contours and tortuous thoracic aortic contours. Calcified atherosclerosis. Hyperinflation of bilateral lungs. Bibasilar heterogeneous and linear pulmonary opacities. Additionally, right apical ill-defined opacity. Possible 6 mm right perihilar pulmonary nodule. Small bilateral pleural effusions. No acute osseous abnormality. The visualized upper abdomen is unremarkable. IMPRESSION: 1. Hyperinflated lungs with bibasilar and right apical pulmonary opacities. Additionally, possible right perihilar pulmonary nodule. Recommend chest CT with contrast for further assessment. 2. Slightly widened mediastinal contours and tortuous thoracic aortic contours. Recommend attention on chest CT for further assessment. Electronically Signed   By: Dirk Arrant M.D.   On: 11/24/2023 14:51     Subjective: Feels ok  Discharge Exam: BP (!) 122/53 (BP Location: Left Arm)   Pulse (!) 108   Temp 97.6 F (36.4 C) (Oral)   Resp (!) 25   Ht 4' 11 (1.499 m)   Wt 44.2 kg   SpO2 (!) 86%   BMI 19.68 kg/m   General: Pt is alert, awake, tachypneic Cardiovascular: RRR, S1/S2 +, no rubs, no gallops Respiratory: CTA bilaterally, no wheezing, no rhonchi Abdominal: Soft, NT, ND, bowel sounds + Extremities: no edema, no cyanosis  The results of significant diagnostics from  this hospitalization (including imaging, microbiology, ancillary and laboratory) are listed below for reference.     Microbiology: No results found for this or any previous visit (from the past 240 hours).   Labs: Basic Metabolic Panel: No results for input(s): NA, K, CL, CO2, GLUCOSE, BUN, CREATININE, CALCIUM, MG, PHOS in the last 168 hours. Liver Function Tests: No results for input(s): AST, ALT, ALKPHOS, BILITOT, PROT, ALBUMIN  in the last 168 hours. CBC: No results for input(s): WBC, NEUTROABS, HGB, HCT, MCV, PLT in the last 168 hours. CBG: No results for input(s): GLUCAP in the last 168 hours. Hgb A1c No results for input(s): HGBA1C in the last 72 hours. Lipid Profile No results for input(s): CHOL, HDL, LDLCALC, TRIG, CHOLHDL, LDLDIRECT in the last 72 hours. Thyroid  function studies No results for input(s): TSH, T4TOTAL, T3FREE, THYROIDAB in the last 72 hours.  Invalid input(s): FREET3 Urinalysis    Component Value Date/Time   COLORURINE YELLOW 11/10/2023 1356   APPEARANCEUR CLEAR 11/10/2023 1356   LABSPEC 1.010 11/10/2023 1356   LABSPEC 1.005 12/11/2015 1044   PHURINE 6.0 11/10/2023 1356   GLUCOSEU NEGATIVE 11/10/2023 1356   GLUCOSEU Negative 12/11/2015 1044   HGBUR MODERATE (A) 11/10/2023 1356   BILIRUBINUR NEGATIVE 11/10/2023 1356   BILIRUBINUR Negative 12/11/2015 1044   KETONESUR NEGATIVE 11/10/2023 1356   PROTEINUR Negative 12/11/2015 1044   UROBILINOGEN 0.2 11/10/2023 1356   UROBILINOGEN 0.2 12/11/2015 1044   NITRITE NEGATIVE 11/10/2023 1356   LEUKOCYTESUR NEGATIVE 11/10/2023 1356   LEUKOCYTESUR Large 12/11/2015 1044    FURTHER DISCHARGE INSTRUCTIONS:   Get Medicines reviewed and adjusted: Please take all your medications with you for your next visit with your Primary MD   Laboratory/radiological data: Please request your Primary MD to go over all hospital tests and  procedure/radiological results at the follow up, please ask your Primary MD to get all Hospital records sent to his/her office.   In some cases, they will be blood work, cultures and biopsy results pending at the time of your discharge. Please request that your primary care M.D. goes through all the records of your hospital data and follows up on these results.   Also Note the following: If you experience worsening of your admission  symptoms, develop shortness of breath, life threatening emergency, suicidal or homicidal thoughts you must seek medical attention immediately by calling 911 or calling your MD immediately  if symptoms less severe.   You must read complete instructions/literature along with all the possible adverse reactions/side effects for all the Medicines you take and that have been prescribed to you. Take any new Medicines after you have completely understood and accpet all the possible adverse reactions/side effects.    Do not drive when taking Pain medications or sleeping medications (Benzodaizepines)   Do not take more than prescribed Pain, Sleep and Anxiety Medications. It is not advisable to combine anxiety,sleep and pain medications without talking with your primary care practitioner   Special Instructions: If you have smoked or chewed Tobacco  in the last 2 yrs please stop smoking, stop any regular Alcohol   and or any Recreational drug use.   Wear Seat belts while driving.   Please note: You were cared for by a hospitalist during your hospital stay. Once you are discharged, your primary care physician will handle any further medical issues. Please note that NO REFILLS for any discharge medications will be authorized once you are discharged, as it is imperative that you return to your primary care physician (or establish a relationship with a primary care physician if you do not have one) for your post hospital discharge needs so that they can reassess your need for  medications and monitor your lab values.  Time coordinating discharge: 35 minutes  SIGNED:  Nilda Fendt, MD, PhD 12/15/2023, 8:56 AM

## 2023-12-15 NOTE — Progress Notes (Signed)
 Palliative Medicine Inpatient Follow Up Note HPI: 83 y.o. female  with past medical history of breast cancer, emphysema, HTN, HLD, H/o ETOH abuse, ETOH hepatitis and pancreatitis admitted on 11/27/2023 with at the direction of PCP with abnormal CT findings. CT with bilateral moderate pleural effusions as well as pericardial effusion and concern for mediastinal mass. 7/4 underwent R thoracentesis. 7/7 had pericardial drain placed and was removed 7/9. Also with a fib RVR during admission. Pericardial fluid consistent with malignancy. PMT consulted to discuss GOC.   Today's Discussion 12/15/2023  *Please note that this is a verbal dictation therefore any spelling or grammatical errors are due to the Dragon [employer] One system interpretation.  Chart reviewed inclusive of vital signs, progress notes, laboratory results, and diagnostic images.   I met with Tammy Wolfe, her daughter and Tammy Wolfe at bedside. We discussed her current symptom burden - she is quite anxious and tachypneic at my time of assessment. Education provided on the importance of management of dyspnea and anxiety. Reviewed the need for continued medication management around the clock even if not requested.  Created space and opportunity for patient to explore thoughts feelings and fears regarding current medical situation. Tammy Wolfe shares she is more aware at this time - she is agreeable to symptom relief.  Reviewed with patients family the rally effect at end of life.   We discussed Tammy Wolfe's stability for [employer] which family would like to pursue.   I spoke with nursing staff to optimize orders.   Questions and concerns addressed/Palliative Support Provided.   Objective Assessment: Vital Signs Vitals:   12/14/23 1738 12/15/23 0512  BP: 99/67 (!) 122/53  Pulse: (!) 126 (!) 108  Resp: 16 (!) 25  Temp: 98.1 F (36.7 C) 97.6 F (36.4 C)  SpO2: (!) 87% (!) 86%    Intake/Output Summary (Last 24 hours) at 12/15/2023 9074 Last  data filed at 12/15/2023 0544 Gross per 24 hour  Intake 0 ml  Output 1550 ml  Net -1550 ml   Last Weight  Most recent update: 12/09/2023  5:35 AM    Weight  44.2 kg (97 lb 7.1 oz)            Gen:  Frail elderly Caucasian F HEENT: Dry mucous membranes CV: Irregular rate and rhythm  PULM:  On 2LPM [place], breathing is labored ABD: soft/nontender EXT: (+) Muscle wasting Neuro: Alert and oriented x3   SUMMARY OF RECOMMENDATIONS   DNAR/DNI  Gold DNR on chart  Comfort Care  Continue morphine  4mg  Q3H ATC  Diazepam  5mg  Q4H ATC  Stop other benzodiazepines  Additional comfort medications on MAR  Plan for transition to [employer] based on MDM: High  Problems Addressed: One or more chronic illnesses with severe exacerbation, progression, or side effects of treatment.  Amount and/or Complexity of Data: Category 1:Review of prior external note(s) from each unique source, Review of the result(s) of each unique test, and Assessment requiring an independent historian(s) and Category 2:Independent interpretation of a test performed by another physician/other qualified health care professional (not separately reported)  Risks: Parenteral controlled substances ______________________________________________________________________________________ Tammy Wolfe Tammy Wolfe Palliative Medicine Team Team Cell Phone: [phone] Please utilize secure chat with additional questions, if there is no response within 30 minutes please call the above phone number  Palliative Medicine Team providers are available by phone from 7am to 7pm daily and can be reached through the team cell phone.  Should this patient require assistance outside of these hours, please call  the patient's attending physician.

## 2023-12-15 NOTE — TOC Progression Note (Signed)
 Transition of Care (TOC) - Progression Note   Family has accepted bed at [facility].   Melissa with [facility] ready for transport to be called. Nurse has number to call report.   NCM spoke to family at bedside, and confirmed they accepted [facility] bed. PTAR called. PTAR paperwork and DNR form on chart.   PTAR estimated time of arrival 30 to 45 minutes . Secure chatted team and told family.  Patient Details  Name: Tammy Wolfe MRN: [id number] Date of Birth: Mar 25, 1941  Transition of Care [facility]) CM/SW Contact  Irene Collings, Powell Jansky, RN Phone Number: 12/15/2023, 9:38 AM  Clinical Narrative:       Expected Discharge Plan: Hospice Medical Facility Barriers to Discharge: Continued Medical Work up  Expected Discharge Plan and Services In-house Referral: NA Discharge Planning Services: CM Consult Post Acute Care Choice: Home Health Living arrangements for the past 2 months:  (Condo) Expected Discharge Date: 12/15/23                 DME Agency: NA                   Social Determinants of Health (SDOH) Interventions SDOH Screenings   Food Insecurity: No Food Insecurity (11/27/2023)  Housing: Low Risk  (11/27/2023)  Transportation Needs: No Transportation Needs (11/27/2023)  Utilities: Not At Risk (11/27/2023)  Alcohol  Screen: Low Risk  (04/21/2023)  Depression (PHQ2-9): Low Risk  (11/23/2023)  Financial Resource Strain: Low Risk  (04/21/2023)  Physical Activity: Inactive (04/21/2023)  Social Connections: Socially Isolated (11/29/2023)  Stress: No Stress Concern Present (04/21/2023)  Tobacco Use: Medium Risk (11/27/2023)  Health Literacy: Adequate Health Literacy (04/21/2023)    Readmission Risk Interventions     No data to display

## 2023-12-25 DEATH — deceased

## 2024-05-11 ENCOUNTER — Ambulatory Visit: Admitting: Internal Medicine
